# Patient Record
Sex: Female | Born: 1955 | Race: White | Hispanic: No | Marital: Married | Smoking: Never smoker
Health system: Southern US, Community
[De-identification: ages and names within clinical notes are randomized; demographics above are authoritative.]

## PROBLEM LIST (undated history)

## (undated) DIAGNOSIS — E119 Type 2 diabetes mellitus without complications: Secondary | ICD-10-CM

## (undated) DIAGNOSIS — G4730 Sleep apnea, unspecified: Secondary | ICD-10-CM

## (undated) DIAGNOSIS — C801 Malignant (primary) neoplasm, unspecified: Secondary | ICD-10-CM

## (undated) DIAGNOSIS — Z87442 Personal history of urinary calculi: Secondary | ICD-10-CM

## (undated) DIAGNOSIS — Z8709 Personal history of other diseases of the respiratory system: Secondary | ICD-10-CM

## (undated) DIAGNOSIS — T4145XA Adverse effect of unspecified anesthetic, initial encounter: Secondary | ICD-10-CM

## (undated) DIAGNOSIS — R112 Nausea with vomiting, unspecified: Secondary | ICD-10-CM

## (undated) DIAGNOSIS — I639 Cerebral infarction, unspecified: Secondary | ICD-10-CM

## (undated) DIAGNOSIS — K219 Gastro-esophageal reflux disease without esophagitis: Secondary | ICD-10-CM

## (undated) DIAGNOSIS — Z9889 Other specified postprocedural states: Secondary | ICD-10-CM

## (undated) DIAGNOSIS — R51 Headache: Secondary | ICD-10-CM

## (undated) DIAGNOSIS — I10 Essential (primary) hypertension: Secondary | ICD-10-CM

## (undated) DIAGNOSIS — M1990 Unspecified osteoarthritis, unspecified site: Secondary | ICD-10-CM

## (undated) DIAGNOSIS — E7800 Pure hypercholesterolemia, unspecified: Secondary | ICD-10-CM

## (undated) DIAGNOSIS — E0590 Thyrotoxicosis, unspecified without thyrotoxic crisis or storm: Secondary | ICD-10-CM

## (undated) DIAGNOSIS — F419 Anxiety disorder, unspecified: Secondary | ICD-10-CM

## (undated) DIAGNOSIS — K08109 Complete loss of teeth, unspecified cause, unspecified class: Secondary | ICD-10-CM

## (undated) DIAGNOSIS — T8859XA Other complications of anesthesia, initial encounter: Secondary | ICD-10-CM

## (undated) DIAGNOSIS — R42 Dizziness and giddiness: Secondary | ICD-10-CM

## (undated) DIAGNOSIS — R519 Headache, unspecified: Secondary | ICD-10-CM

## (undated) DIAGNOSIS — IMO0002 Reserved for concepts with insufficient information to code with codable children: Secondary | ICD-10-CM

## (undated) DIAGNOSIS — Z972 Presence of dental prosthetic device (complete) (partial): Secondary | ICD-10-CM

## (undated) DIAGNOSIS — Z8719 Personal history of other diseases of the digestive system: Secondary | ICD-10-CM

## (undated) DIAGNOSIS — I499 Cardiac arrhythmia, unspecified: Secondary | ICD-10-CM

## (undated) DIAGNOSIS — G5600 Carpal tunnel syndrome, unspecified upper limb: Secondary | ICD-10-CM

## (undated) HISTORY — PX: WRIST SURGERY: SHX841

## (undated) HISTORY — PX: EYE SURGERY: SHX253

## (undated) HISTORY — PX: CARDIAC CATHETERIZATION: SHX172

## (undated) HISTORY — DX: Pure hypercholesterolemia, unspecified: E78.00

## (undated) HISTORY — PX: CATARACT EXTRACTION, BILATERAL: SHX1313

## (undated) HISTORY — DX: Thyrotoxicosis, unspecified without thyrotoxic crisis or storm: E05.90

## (undated) HISTORY — PX: ABDOMINAL HYSTERECTOMY: SHX81

## (undated) HISTORY — PX: BACK SURGERY: SHX140

## (undated) HISTORY — PX: SHOULDER SURGERY: SHX246

## (undated) HISTORY — PX: PARTIAL HYSTERECTOMY: SHX80

## (undated) HISTORY — DX: Essential (primary) hypertension: I10

## (undated) SURGERY — Surgical Case
Anesthesia: *Unknown

---
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
## 1988-11-28 HISTORY — PX: HERNIA REPAIR: SHX51

## 1993-11-28 DIAGNOSIS — I499 Cardiac arrhythmia, unspecified: Secondary | ICD-10-CM

## 1993-11-28 HISTORY — DX: Cardiac arrhythmia, unspecified: I49.9

## 1998-08-03 ENCOUNTER — Emergency Department (HOSPITAL_COMMUNITY): Admission: EM | Admit: 1998-08-03 | Discharge: 1998-08-03 | Payer: Self-pay | Admitting: Emergency Medicine

## 1998-08-06 ENCOUNTER — Emergency Department (HOSPITAL_COMMUNITY): Admission: EM | Admit: 1998-08-06 | Discharge: 1998-08-06 | Payer: Self-pay

## 1999-01-01 ENCOUNTER — Ambulatory Visit (HOSPITAL_COMMUNITY): Admission: RE | Admit: 1999-01-01 | Discharge: 1999-01-01 | Payer: Self-pay | Admitting: Urology

## 1999-01-08 ENCOUNTER — Other Ambulatory Visit: Admission: RE | Admit: 1999-01-08 | Discharge: 1999-01-08 | Payer: Self-pay | Admitting: *Deleted

## 1999-02-24 ENCOUNTER — Inpatient Hospital Stay (HOSPITAL_COMMUNITY): Admission: RE | Admit: 1999-02-24 | Discharge: 1999-02-26 | Payer: Self-pay | Admitting: Obstetrics and Gynecology

## 1999-03-02 ENCOUNTER — Emergency Department (HOSPITAL_COMMUNITY): Admission: EM | Admit: 1999-03-02 | Discharge: 1999-03-02 | Payer: Self-pay | Admitting: Internal Medicine

## 1999-12-10 ENCOUNTER — Ambulatory Visit (HOSPITAL_COMMUNITY): Admission: RE | Admit: 1999-12-10 | Discharge: 1999-12-10 | Payer: Self-pay | Admitting: Gastroenterology

## 1999-12-10 ENCOUNTER — Encounter (INDEPENDENT_AMBULATORY_CARE_PROVIDER_SITE_OTHER): Payer: Self-pay | Admitting: Specialist

## 1999-12-10 ENCOUNTER — Emergency Department (HOSPITAL_COMMUNITY): Admission: EM | Admit: 1999-12-10 | Discharge: 1999-12-10 | Payer: Self-pay | Admitting: Emergency Medicine

## 2000-04-23 ENCOUNTER — Emergency Department (HOSPITAL_COMMUNITY): Admission: EM | Admit: 2000-04-23 | Discharge: 2000-04-23 | Payer: Self-pay | Admitting: Emergency Medicine

## 2000-04-23 ENCOUNTER — Encounter: Payer: Self-pay | Admitting: Emergency Medicine

## 2002-11-28 HISTORY — PX: BACK SURGERY: SHX140

## 2004-08-16 ENCOUNTER — Encounter: Admission: RE | Admit: 2004-08-16 | Discharge: 2004-09-28 | Payer: Self-pay | Admitting: Orthopedic Surgery

## 2007-01-05 ENCOUNTER — Ambulatory Visit: Payer: Self-pay | Admitting: Vascular Surgery

## 2008-07-01 ENCOUNTER — Ambulatory Visit (HOSPITAL_COMMUNITY): Admission: RE | Admit: 2008-07-01 | Discharge: 2008-07-01 | Payer: Self-pay | Admitting: Internal Medicine

## 2009-11-10 DIAGNOSIS — M799 Soft tissue disorder, unspecified: Secondary | ICD-10-CM | POA: Insufficient documentation

## 2009-11-10 DIAGNOSIS — A0472 Enterocolitis due to Clostridium difficile, not specified as recurrent: Secondary | ICD-10-CM | POA: Insufficient documentation

## 2009-11-10 DIAGNOSIS — H7290 Unspecified perforation of tympanic membrane, unspecified ear: Secondary | ICD-10-CM | POA: Insufficient documentation

## 2009-11-10 DIAGNOSIS — R42 Dizziness and giddiness: Secondary | ICD-10-CM | POA: Insufficient documentation

## 2009-11-10 DIAGNOSIS — Z833 Family history of diabetes mellitus: Secondary | ICD-10-CM | POA: Insufficient documentation

## 2009-11-10 DIAGNOSIS — E785 Hyperlipidemia, unspecified: Secondary | ICD-10-CM | POA: Insufficient documentation

## 2009-11-10 DIAGNOSIS — R519 Headache, unspecified: Secondary | ICD-10-CM | POA: Insufficient documentation

## 2009-11-10 DIAGNOSIS — H8109 Meniere's disease, unspecified ear: Secondary | ICD-10-CM | POA: Insufficient documentation

## 2011-11-04 ENCOUNTER — Emergency Department (HOSPITAL_COMMUNITY): Payer: Self-pay

## 2011-11-04 ENCOUNTER — Encounter: Payer: Self-pay | Admitting: *Deleted

## 2011-11-04 ENCOUNTER — Emergency Department (HOSPITAL_COMMUNITY)
Admission: EM | Admit: 2011-11-04 | Discharge: 2011-11-04 | Disposition: A | Discharge status: Home / Self Care | Payer: Self-pay | Attending: Emergency Medicine | Admitting: Emergency Medicine

## 2011-11-04 DIAGNOSIS — M79605 Pain in left leg: Secondary | ICD-10-CM

## 2011-11-04 DIAGNOSIS — M79609 Pain in unspecified limb: Secondary | ICD-10-CM | POA: Insufficient documentation

## 2011-11-04 DIAGNOSIS — W07XXXA Fall from chair, initial encounter: Secondary | ICD-10-CM | POA: Insufficient documentation

## 2011-11-04 DIAGNOSIS — M5430 Sciatica, unspecified side: Secondary | ICD-10-CM | POA: Insufficient documentation

## 2011-11-04 DIAGNOSIS — I10 Essential (primary) hypertension: Secondary | ICD-10-CM | POA: Insufficient documentation

## 2011-11-04 DIAGNOSIS — M25559 Pain in unspecified hip: Secondary | ICD-10-CM | POA: Insufficient documentation

## 2011-11-04 HISTORY — DX: Essential (primary) hypertension: I10

## 2011-11-04 LAB — DIFFERENTIAL
Basophils Relative: 0 % (ref 0–1)
Lymphocytes Relative: 28 % (ref 12–46)
Lymphs Abs: 2.5 10*3/uL (ref 0.7–4.0)
Monocytes Absolute: 0.5 10*3/uL (ref 0.1–1.0)
Monocytes Relative: 6 % (ref 3–12)
Neutro Abs: 5.8 10*3/uL (ref 1.7–7.7)

## 2011-11-04 LAB — BASIC METABOLIC PANEL
BUN: 18 mg/dL (ref 6–23)
CO2: 28 mEq/L (ref 19–32)
Chloride: 99 mEq/L (ref 96–112)
Creatinine, Ser: 1.06 mg/dL (ref 0.50–1.10)
Glucose, Bld: 117 mg/dL — ABNORMAL HIGH (ref 70–99)

## 2011-11-04 LAB — CBC
HCT: 37 % (ref 36.0–46.0)
Hemoglobin: 12.5 g/dL (ref 12.0–15.0)
MCHC: 33.8 g/dL (ref 30.0–36.0)
RBC: 4.03 MIL/uL (ref 3.87–5.11)

## 2011-11-04 MED ORDER — PREDNISONE 10 MG PO TABS: 20.0000 mg | ORAL_TABLET | Freq: Every day | ORAL | Status: DC

## 2011-11-04 MED ORDER — MORPHINE SULFATE 4 MG/ML IJ SOLN
4.0000 mg | Freq: Once | INTRAMUSCULAR | Status: AC
  Administered 2011-11-04: 4 mg via INTRAVENOUS
  Filled 2011-11-04: qty 1

## 2011-11-04 MED ORDER — OXYCODONE-ACETAMINOPHEN 5-325 MG PO TABS
2.0000 | ORAL_TABLET | Freq: Once | ORAL | Status: AC
  Administered 2011-11-04: 2 via ORAL
  Filled 2011-11-04: qty 2

## 2011-11-04 MED ORDER — PREDNISONE 20 MG PO TABS
60.0000 mg | ORAL_TABLET | Freq: Once | ORAL | Status: AC
  Administered 2011-11-04: 60 mg via ORAL
  Filled 2011-11-04: qty 3

## 2011-11-04 MED ORDER — OXYCODONE-ACETAMINOPHEN 5-325 MG PO TABS: 2.0000 | ORAL_TABLET | ORAL | Status: AC | PRN

## 2011-11-04 NOTE — ED Notes (Signed)
I gave the patient a pair of extra large socks.

## 2011-11-04 NOTE — ED Notes (Signed)
Patient missed a step.  She fell forward and twisted her back and her left hip.  Patient also reports numbness in her leg on the left side

## 2011-11-04 NOTE — ED Provider Notes (Signed)
History     CSN: [id number] Arrival date & time: 11/04/2011  2:19 PM   First MD Initiated Contact with Patient 11/04/11 1704      Chief Complaint  Patient presents with  . Fall    (Consider location/radiation/quality/duration/timing/severity/associated sxs/prior treatment) Patient is a 55 y.o. female presenting with fall. The history is provided by the patient. No language interpreter was used.  Fall The accident occurred 3 to 5 hours ago. Incident: from a chair. She fell from a height of 1 to 2 ft. She landed on a hard floor. There was no blood loss. The point of impact was the left hip. Pain location: left thigh. The pain is severe. She was ambulatory at the scene. There was no entrapment after the fall. There was no drug use involved in the accident. There was no alcohol use involved in the accident. The symptoms are aggravated by activity and ambulation.    Past Medical History  Diagnosis Date  . Hypertension     Past Surgical History  Procedure Date  . Back surgery   . Abdominal hysterectomy     No family history on file.  History  Substance Use Topics  . Smoking status: Never Smoker   . Smokeless tobacco: Not on file  . Alcohol Use: No    OB History    Grav Para Term Preterm Abortions TAB SAB Ect Mult Living                  Review of Systems  Musculoskeletal: Positive for myalgias and arthralgias.  All other systems reviewed and are negative.    Allergies  Dilaudid and Penicillins  Home Medications   Current Outpatient Rx  Name Route Sig Dispense Refill  . ASPIRIN 81 MG PO TABS Oral Take 81 mg by mouth daily.      Marland Kitchen ESTRADIOL 1 MG PO TABS Oral Take 1 mg by mouth daily.      Marland Kitchen METOPROLOL SUCCINATE ER 50 MG PO TB24 Oral Take 50 mg by mouth daily.      . TRIAMTERENE-HCTZ 75-50 MG PO TABS Oral Take 1 tablet by mouth daily.        BP 145/94  Pulse 81  Temp(Src) 98.4 F (36.9 C) (Oral)  Resp 18  Ht 5\' 7"  (1.702 m)  Wt 160 lb (72.576 kg)  BMI  25.06 kg/m2  SpO2 96%  Physical Exam  Nursing note and vitals reviewed. Constitutional: She is oriented to person, place, and time. She appears well-developed and well-nourished.  HENT:  Head: Normocephalic.  Eyes: Conjunctivae are normal. Pupils are equal, round, and reactive to light.  Neck: Normal range of motion. Neck supple.  Cardiovascular: Normal rate, regular rhythm and normal heart sounds.   Pulmonary/Chest: Effort normal and breath sounds normal.  Abdominal: Soft. Bowel sounds are normal.  Musculoskeletal: She exhibits tenderness.       Right shoulder: She exhibits tenderness.       Legs: Neurological: She is alert and oriented to person, place, and time. No sensory deficit.  Skin: Skin is warm and dry.  Psychiatric: She has a normal mood and affect.    ED Course  Procedures (including critical care time)   Labs Reviewed  CBC  DIFFERENTIAL  BASIC METABOLIC PANEL   Dg Hip Complete Left  11/04/2011  *RADIOLOGY REPORT*  Clinical Data: Left hip pain post fall  LEFT HIP - COMPLETE 2+ VIEW  Comparison: None.  Findings: Question mild osseous demineralization. Minimal narrowing of left hip  joint with questionable subchondral cystic changes, suggesting degenerative joint disease. Subtle cortical irregularity identified at superior margin of left femoral neck. Findings suspicious for nondisplaced fracture though not definitive. No additional focal bony abnormalities are identified. Pelvis appears intact.  IMPRESSION: Cortical irregularity is superior margin of left femoral neck worrisome for nondisplaced femoral neck fracture. Recommend further assessment by either MR or CT without contrast.  Original Report Authenticated By: Lollie Marrow, M.D.     No diagnosis found.  CT of hip did not confirm fracture.  Results discussed with patient.  Patient will be discharged home with short course of steroids and pain meds, and is requested to follow-up with her PCP on Monday.  MDM           Jimmye Norman, NP 11/04/11 2056

## 2011-11-04 NOTE — ED Notes (Signed)
I gave the patient a warm blanket. 

## 2011-11-05 NOTE — ED Provider Notes (Signed)
Medical screening examination/treatment/procedure(s) were performed by non-physician practitioner and as supervising physician I was immediately available for consultation/collaboration.  Nicholes Stairs, MD 11/05/11 [phone]

## 2011-12-02 DIAGNOSIS — E669 Obesity, unspecified: Secondary | ICD-10-CM | POA: Insufficient documentation

## 2012-09-18 ENCOUNTER — Encounter (HOSPITAL_BASED_OUTPATIENT_CLINIC_OR_DEPARTMENT_OTHER): Payer: Self-pay | Admitting: *Deleted

## 2012-09-18 NOTE — Progress Notes (Signed)
To come in for [id number]  

## 2012-09-19 ENCOUNTER — Encounter (HOSPITAL_BASED_OUTPATIENT_CLINIC_OR_DEPARTMENT_OTHER)
Admission: RE | Admit: 2012-09-19 | Discharge: 2012-09-19 | Disposition: A | Discharge status: Home / Self Care | Payer: BC Managed Care – PPO | Source: Ambulatory Visit | Attending: Orthopedic Surgery | Admitting: Orthopedic Surgery

## 2012-09-19 LAB — BASIC METABOLIC PANEL
BUN: 8 mg/dL (ref 6–23)
CO2: 30 mEq/L (ref 19–32)
Calcium: 9.1 mg/dL (ref 8.4–10.5)
Chloride: 99 mEq/L (ref 96–112)
Creatinine, Ser: 0.88 mg/dL (ref 0.50–1.10)
Glucose, Bld: 103 mg/dL — ABNORMAL HIGH (ref 70–99)

## 2012-09-20 ENCOUNTER — Encounter (HOSPITAL_BASED_OUTPATIENT_CLINIC_OR_DEPARTMENT_OTHER)
Admission: RE | Disposition: A | Discharge status: Home / Self Care | Payer: Self-pay | Source: Ambulatory Visit | Attending: Orthopedic Surgery

## 2012-09-20 ENCOUNTER — Ambulatory Visit (HOSPITAL_BASED_OUTPATIENT_CLINIC_OR_DEPARTMENT_OTHER)
Admission: RE | Admit: 2012-09-20 | Discharge: 2012-09-20 | Disposition: A | Discharge status: Home / Self Care | Payer: BC Managed Care – PPO | Source: Ambulatory Visit | Attending: Orthopedic Surgery | Admitting: Orthopedic Surgery

## 2012-09-20 ENCOUNTER — Encounter (HOSPITAL_BASED_OUTPATIENT_CLINIC_OR_DEPARTMENT_OTHER): Payer: Self-pay | Admitting: Anesthesiology

## 2012-09-20 ENCOUNTER — Encounter (HOSPITAL_BASED_OUTPATIENT_CLINIC_OR_DEPARTMENT_OTHER): Payer: Self-pay | Admitting: *Deleted

## 2012-09-20 ENCOUNTER — Ambulatory Visit (HOSPITAL_BASED_OUTPATIENT_CLINIC_OR_DEPARTMENT_OTHER): Payer: BC Managed Care – PPO | Admitting: Anesthesiology

## 2012-09-20 DIAGNOSIS — G5600 Carpal tunnel syndrome, unspecified upper limb: Secondary | ICD-10-CM | POA: Insufficient documentation

## 2012-09-20 DIAGNOSIS — I10 Essential (primary) hypertension: Secondary | ICD-10-CM | POA: Insufficient documentation

## 2012-09-20 DIAGNOSIS — Z01810 Encounter for preprocedural cardiovascular examination: Secondary | ICD-10-CM | POA: Insufficient documentation

## 2012-09-20 DIAGNOSIS — M654 Radial styloid tenosynovitis [de Quervain]: Secondary | ICD-10-CM | POA: Insufficient documentation

## 2012-09-20 DIAGNOSIS — Z01812 Encounter for preprocedural laboratory examination: Secondary | ICD-10-CM | POA: Insufficient documentation

## 2012-09-20 HISTORY — PX: DORSAL COMPARTMENT RELEASE: SHX5039

## 2012-09-20 HISTORY — DX: Reserved for concepts with insufficient information to code with codable children: IMO0002

## 2012-09-20 HISTORY — DX: Carpal tunnel syndrome, unspecified upper limb: G56.00

## 2012-09-20 HISTORY — DX: Cardiac arrhythmia, unspecified: I49.9

## 2012-09-20 HISTORY — DX: Unspecified osteoarthritis, unspecified site: M19.90

## 2012-09-20 HISTORY — PX: CARPAL TUNNEL RELEASE: SHX101

## 2012-09-20 HISTORY — DX: Complete loss of teeth, unspecified cause, unspecified class: K08.109

## 2012-09-20 HISTORY — DX: Presence of dental prosthetic device (complete) (partial): Z97.2

## 2012-09-20 SURGERY — CARPAL TUNNEL RELEASE
Anesthesia: General | Site: Wrist | Laterality: Left | Wound class: Clean

## 2012-09-20 MED ORDER — MORPHINE SULFATE 2 MG/ML IJ SOLN: 1.0000 mg | INTRAMUSCULAR | Status: DC | PRN

## 2012-09-20 MED ORDER — LIDOCAINE HCL 2 % IJ SOLN
INTRAMUSCULAR | Status: DC | PRN
  Administered 2012-09-20: 4 mL

## 2012-09-20 MED ORDER — ONDANSETRON HCL 4 MG/2ML IJ SOLN
INTRAMUSCULAR | Status: DC | PRN
  Administered 2012-09-20: 4 mg via INTRAVENOUS

## 2012-09-20 MED ORDER — ACETAMINOPHEN 10 MG/ML IV SOLN: 1000.0000 mg | Freq: Once | INTRAVENOUS | Status: DC | PRN

## 2012-09-20 MED ORDER — PROPOFOL 10 MG/ML IV BOLUS
INTRAVENOUS | Status: DC | PRN
  Administered 2012-09-20: 150 mg via INTRAVENOUS

## 2012-09-20 MED ORDER — FENTANYL CITRATE 0.05 MG/ML IJ SOLN
INTRAMUSCULAR | Status: DC | PRN
  Administered 2012-09-20: 50 ug via INTRAVENOUS

## 2012-09-20 MED ORDER — MIDAZOLAM HCL 5 MG/5ML IJ SOLN
INTRAMUSCULAR | Status: DC | PRN
  Administered 2012-09-20: 2 mg via INTRAVENOUS

## 2012-09-20 MED ORDER — DEXAMETHASONE SODIUM PHOSPHATE 4 MG/ML IJ SOLN
INTRAMUSCULAR | Status: DC | PRN
  Administered 2012-09-20: 10 mg via INTRAVENOUS

## 2012-09-20 MED ORDER — LIDOCAINE HCL (CARDIAC) 20 MG/ML IV SOLN
INTRAVENOUS | Status: DC | PRN
  Administered 2012-09-20: 50 mg via INTRAVENOUS

## 2012-09-20 MED ORDER — OXYCODONE-ACETAMINOPHEN 5-325 MG PO TABS: ORAL_TABLET | ORAL | Status: DC

## 2012-09-20 MED ORDER — LACTATED RINGERS IV SOLN
INTRAVENOUS | Status: DC
  Administered 2012-09-20: 07:00:00 via INTRAVENOUS

## 2012-09-20 MED ORDER — ONDANSETRON HCL 4 MG/2ML IJ SOLN: 4.0000 mg | Freq: Once | INTRAMUSCULAR | Status: DC | PRN

## 2012-09-20 SURGICAL SUPPLY — 47 items
BANDAGE ADHESIVE 1X3 (GAUZE/BANDAGES/DRESSINGS) IMPLANT
BANDAGE ELASTIC 3 VELCRO ST LF (GAUZE/BANDAGES/DRESSINGS) ×2 IMPLANT
BLADE MINI RND TIP GREEN BEAV (BLADE) IMPLANT
BLADE SURG 15 STRL LF DISP TIS (BLADE) ×1 IMPLANT
BLADE SURG 15 STRL SS (BLADE) ×2
BNDG CMPR 9X4 STRL LF SNTH (GAUZE/BANDAGES/DRESSINGS) ×1
BNDG ESMARK 4X9 LF (GAUZE/BANDAGES/DRESSINGS) ×1 IMPLANT
BRUSH SCRUB EZ PLAIN DRY (MISCELLANEOUS) ×2 IMPLANT
CLOTH BEACON ORANGE TIMEOUT ST (SAFETY) ×2 IMPLANT
CORDS BIPOLAR (ELECTRODE) ×1 IMPLANT
COVER MAYO STAND STRL (DRAPES) ×2 IMPLANT
COVER SURGICAL LIGHT HANDLE (MISCELLANEOUS) ×1 IMPLANT
COVER TABLE BACK 60X90 (DRAPES) ×2 IMPLANT
CUFF TOURNIQUET SINGLE 18IN (TOURNIQUET CUFF) ×1 IMPLANT
DECANTER SPIKE VIAL GLASS SM (MISCELLANEOUS) IMPLANT
DRAPE EXTREMITY T 121X128X90 (DRAPE) ×2 IMPLANT
DRAPE SURG 17X23 STRL (DRAPES) ×2 IMPLANT
DRSG TEGADERM 4X4.75 (GAUZE/BANDAGES/DRESSINGS) IMPLANT
GLOVE BIO SURGEON STRL SZ7.5 (GLOVE) ×1 IMPLANT
GLOVE BIOGEL M STRL SZ7.5 (GLOVE) ×2 IMPLANT
GLOVE BIOGEL PI IND STRL 8 (GLOVE) IMPLANT
GLOVE BIOGEL PI INDICATOR 8 (GLOVE) ×1
GLOVE ORTHO TXT STRL SZ7.5 (GLOVE) ×2 IMPLANT
GOWN BRE IMP PREV XXLGXLNG (GOWN DISPOSABLE) ×3 IMPLANT
GOWN PREVENTION PLUS XLARGE (GOWN DISPOSABLE) ×1 IMPLANT
NDL SAFETY ECLIPSE 18X1.5 (NEEDLE) IMPLANT
NEEDLE 27GAX1X1/2 (NEEDLE) ×1 IMPLANT
NEEDLE HYPO 18GX1.5 SHARP (NEEDLE) ×2
PACK BASIN DAY SURGERY FS (CUSTOM PROCEDURE TRAY) ×2 IMPLANT
PAD CAST 3X4 CTTN HI CHSV (CAST SUPPLIES) ×1 IMPLANT
PADDING CAST ABS 4INX4YD NS (CAST SUPPLIES)
PADDING CAST ABS COTTON 4X4 ST (CAST SUPPLIES) ×1 IMPLANT
PADDING CAST COTTON 3X4 STRL (CAST SUPPLIES) ×2
SLEEVE SCD COMPRESS KNEE MED (MISCELLANEOUS) IMPLANT
SPLINT PLASTER CAST XFAST 3X15 (CAST SUPPLIES) ×5 IMPLANT
SPLINT PLASTER XTRA FASTSET 3X (CAST SUPPLIES) ×5
SPONGE GAUZE 4X4 12PLY (GAUZE/BANDAGES/DRESSINGS) ×1 IMPLANT
STOCKINETTE 4X48 STRL (DRAPES) ×2 IMPLANT
STRIP CLOSURE SKIN 1/2X4 (GAUZE/BANDAGES/DRESSINGS) ×2 IMPLANT
SUT PROLENE 3 0 PS 2 (SUTURE) ×2 IMPLANT
SUT VIC AB 4-0 P-3 18XBRD (SUTURE) IMPLANT
SUT VIC AB 4-0 P3 18 (SUTURE)
SYR 3ML 23GX1 SAFETY (SYRINGE) IMPLANT
SYR CONTROL 10ML LL (SYRINGE) ×1 IMPLANT
TRAY DSU PREP LF (CUSTOM PROCEDURE TRAY) ×2 IMPLANT
UNDERPAD 30X30 INCONTINENT (UNDERPADS AND DIAPERS) ×2 IMPLANT
WATER STERILE IRR 1000ML POUR (IV SOLUTION) ×1 IMPLANT

## 2012-09-20 NOTE — H&P (Signed)
Natalie Bond is an 56 y.o. female.   Chief Complaint: c/o chronic and progressive numbness and tingling of the left hand and pain left 1st D.C. 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 She has had a 5 month history of bilateral hand numbness and tingling left worse than right. She also notes tenderness at her left first dorsal compartment and episodic triggering of her fingers, most notably her left ring finger. She has been dropping objects on a regular basis. She seeks an upper extremity orthopaedic consult. She has been on Nortriptyline to try and blunt her symptoms. She notes that this has been mildly helpful.   Past Medical History  Diagnosis Date  . Hypertension   . Dysrhythmia 1995    irreg hr  . Arthritis   . Carpal tunnel syndrome   . DDD (degenerative disc disease)   . Full dentures     Past Surgical History  Procedure Date  . Back surgery 2004    [id number]  . Abdominal hysterectomy   . Eye surgery     bilat cataracts    History reviewed. No pertinent family history. Social History:  reports that she has never smoked. She does not have any smokeless tobacco history on file. She reports that she does not drink alcohol or use illicit drugs.  Allergies:  Allergies  Allergen Reactions  . Dilaudid (Hydromorphone Hcl) Anaphylaxis  . Penicillins Hives    Medications Prior to Admission  Medication Sig Dispense Refill  . aspirin 81 MG tablet Take 81 mg by mouth daily.        . cyclobenzaprine (FLEXERIL) 10 MG tablet Take 10 mg by mouth 3 (three) times daily as needed.      Marland Kitchen estradiol (ESTRACE) 1 MG tablet Take 1 mg by mouth daily.        Marland Kitchen HYDROcodone-acetaminophen (NORCO/VICODIN) 5-325 MG per tablet Take 1 tablet by mouth every 6 (six) hours as needed.      . metoprolol (TOPROL-XL) 50 MG 24 hr tablet Take 50 mg by mouth daily.        Marland Kitchen triamterene-hydrochlorothiazide (MAXZIDE) 75-50 MG per tablet Take 1 tablet by mouth daily.           Results for orders placed during the hospital encounter of 09/20/12 (from the past 48 hour(s))  BASIC METABOLIC PANEL     Status: Abnormal   Collection Time   09/19/12  4:30 PM      Component Value Range Comment   Sodium 138  135 - 145 mEq/L    Potassium 3.4 (*) 3.5 - 5.1 mEq/L    Chloride 99  96 - 112 mEq/L    CO2 30  19 - 32 mEq/L    Glucose, Bld 103 (*) 70 - 99 mg/dL    BUN 8  6 - 23 mg/dL    Creatinine, Ser 6.04  0.50 - 1.10 mg/dL    Calcium 9.1  8.4 - 54.0 mg/dL    GFR calc non Af Amer 73 (*) >90 mL/min    GFR calc Af Amer 85 (*) >90 mL/min   POCT HEMOGLOBIN-HEMACUE     Status: Normal   Collection Time   09/20/12  6:55 AM      Component Value Range Comment   Hemoglobin 12.9  12.0 - 15.0 g/dL     No results found.   Pertinent items are noted in HPI.  Blood pressure 179/92, pulse 46, temperature 97.3 F (36.3 C), temperature  source Oral, resp. rate 18, height 5\' 7"  (1.702 m), weight 83.099 kg (183 lb 3.2 oz), SpO2 98.00%.  General appearance: alert Head: Normocephalic, without obvious abnormality Neck: supple, symmetrical, trachea midline Resp: clear to auscultation bilaterally Cardio: regular rate and rhythm GI: normal findings: bowel sounds normal Extremities: Inspection of her hands reveals mild shouldering of her thumb CMC joints. She has borderline triggering of her left ring finger. She has a positive wrist flexion test reproducing median numbness bilaterally. She has a positive Tinel's sign over the median nerve bilaterally. She has a positive Finkelstein's maneuver on the left. She has positive Tinel's over the left radial sensory branches.   X-rays of her left hand AP, lateral and obliques demonstrate age appropriate changes at her IP joints. She has narrowing and marginal osteophyte formation at the thumb [facility] joint.  Electrodiagnostic studies: These confirmed very significant bilateral carpal tunnel syndrome left worse than right.    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   DASNOIT,Dallis Czaja J 09/20/2012, 7:04 AM     H&P documentation: 09/20/2012  -History and Physical Reviewed  -Patient has been re-examined  -No change in the plan of care  Wyn Forster, MD

## 2012-09-20 NOTE — Op Note (Signed)
[id number] 

## 2012-09-20 NOTE — Anesthesia Preprocedure Evaluation (Signed)
Anesthesia Evaluation  Patient identified by MRN, date of birth, ID band Patient awake    Reviewed: Allergy & Precautions, H&P , NPO status , Patient's Chart, lab work & pertinent test results  Airway Mallampati: II      Dental  (+) Teeth Intact, Edentulous Upper and Edentulous Lower   Pulmonary  breath sounds clear to auscultation        Cardiovascular Rhythm:Regular Rate:Normal     Neuro/Psych    GI/Hepatic   Endo/Other    Renal/GU      Musculoskeletal   Abdominal   Peds  Hematology   Anesthesia Other Findings   Reproductive/Obstetrics                           Anesthesia Physical Anesthesia Plan  ASA: II  Anesthesia Plan: General   Post-op Pain Management:    Induction: Intravenous  Airway Management Planned: LMA  Additional Equipment:   Intra-op Plan:   Post-operative Plan:   Informed Consent:   Plan Discussed with: CRNA and Surgeon  Anesthesia Plan Comments:         Anesthesia Quick Evaluation

## 2012-09-20 NOTE — Anesthesia Postprocedure Evaluation (Signed)
  Anesthesia Post-op Note  [name]  Procedure(s) Performed: Procedure(s) (LRB) with comments: CARPAL TUNNEL RELEASE (Left) RELEASE DORSAL COMPARTMENT (DEQUERVAIN) (Left)  Patient Location: PACU  Anesthesia Type: General  Level of Consciousness: awake, alert  and oriented  Airway and Oxygen Therapy: Patient Spontanous Breathing  Post-op Pain: none  Post-op Assessment: Post-op Vital signs reviewed and Patient's Cardiovascular Status Stable  Post-op Vital Signs: stable  Complications: No apparent anesthesia complications

## 2012-09-20 NOTE — Brief Op Note (Signed)
09/20/2012  8:13 AM  PATIENT:  Natalie Bond  56 y.o. female  PRE-OPERATIVE DIAGNOSIS:  Severe left carpal tunnel syndrome, stenosing tenosynovitis left 1st dorsal compartment   POST-OPERATIVE DIAGNOSIS:  Severe left carpal tunnel syndrome, stenosing tenosynovitis left 1st dorsal compartment   PROCEDURE:  Procedure(s) (LRB) with comments: CARPAL TUNNEL RELEASE (Left) RELEASE DORSAL COMPARTMENT (DEQUERVAIN) (Left)  SURGEON:  Surgeon(s) and Role:    * Wyn Forster., MD - Primary  PHYSICIAN ASSISTANT:   ASSISTANTS:Jamaris Biernat Dasnoit,P.A-C   ANESTHESIA:   general  EBL:     BLOOD ADMINISTERED:none  DRAINS: none   LOCAL MEDICATIONS USED:  XYLOCAINE   SPECIMEN:  No Specimen  DISPOSITION OF SPECIMEN:  N/A  COUNTS:  YES  TOURNIQUET:  * Missing tourniquet times found for documented tourniquets in log:  16109 *  DICTATION: .Other Dictation: Dictation Number [id number]  PLAN OF CARE: Discharge to home after PACU  PATIENT DISPOSITION:  PACU - hemodynamically stable.

## 2012-09-20 NOTE — Anesthesia Procedure Notes (Signed)
Procedure Name: LMA Insertion Date/Time: 09/20/2012 7:49 AM Performed by: Gar Gibbon Pre-anesthesia Checklist: Patient identified, Emergency Drugs available, Suction available and Patient being monitored Patient Re-evaluated:Patient Re-evaluated prior to inductionOxygen Delivery Method: Circle System Utilized Preoxygenation: Pre-oxygenation with 100% oxygen Intubation Type: IV induction Ventilation: Mask ventilation without difficulty LMA: LMA inserted LMA Size: 4.0 Number of attempts: 1 Airway Equipment and Method: bite block Placement Confirmation: positive ETCO2 Tube secured with: Tape Dental Injury: Teeth and Oropharynx as per pre-operative assessment

## 2012-09-20 NOTE — Transfer of Care (Signed)
Immediate Anesthesia Transfer of Care Note  [name]  Procedure(s) Performed: Procedure(s) (LRB) with comments: CARPAL TUNNEL RELEASE (Left) RELEASE DORSAL COMPARTMENT (DEQUERVAIN) (Left)  Patient Location: PACU  Anesthesia Type: General  Level of Consciousness: sedated  Airway & Oxygen Therapy: Patient Spontanous Breathing and Patient connected to face mask oxygen  Post-op Assessment: Report given to PACU RN and Post -op Vital signs reviewed and stable  Post vital signs: Reviewed and stable  Complications: No apparent anesthesia complications

## 2012-09-21 ENCOUNTER — Encounter (HOSPITAL_BASED_OUTPATIENT_CLINIC_OR_DEPARTMENT_OTHER): Payer: Self-pay | Admitting: Orthopedic Surgery

## 2012-09-21 NOTE — Op Note (Signed)
NAMEBonnye Bond                ACCOUNT NO.:  [id number]  MEDICAL RECORD NO.:  [id number]  LOCATION:                                 FACILITY:  PHYSICIAN:  Katy Fitch. Mazie Fencl, M.D. DATE OF BIRTH:  [birth year]  DATE OF PROCEDURE:  09/20/2012 DATE OF DISCHARGE:                              OPERATIVE REPORT   PREOPERATIVE DIAGNOSES: 1. 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 2. Chronic stenosing tenosynovitis, left first dorsal compartment.  POSTOPERATIVE DIAGNOSES: 1. 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 2. Chronic stenosing tenosynovitis, left first dorsal compartment. 3. Identification of septum between abductor pollicis longus and     extensor pollicis brevis tendons.  OPERATION: 1. Release of left transverse carpal ligament. 2. Through separate incision, release of left first dorsal compartment     and resection of septum.  OPERATING SURGEON:  Katy Fitch. Awad Gladd, MD  ASSISTANT:  Marveen Reeks Dasnoit, PA-C  ANESTHESIA:  General by LMA.  SUPERVISING ANESTHESIOLOGIST:  Guadalupe Maple, MD  INDICATIONS:  Natalie Bond is a 56 year old woman referred through the courtesy of Dr. Dossie Arbour for evaluation and management of hand numbness and stenosing tenosynovitis symptoms on the radial aspect of the left wrist.  She had intermittent triggering of fingers as well.  Clinical examination confirmed left carpal tunnel syndrome and stenosing tenosynovitis of the left first dorsal compartment.  She did not have active finger stenosing tenosynovitis at the time of our exam.  She was referred for electrodiagnostic studies that confirmed significant bilateral carpal tunnel syndrome, left worse than right.  We advised to proceed with release of her left transcarpal ligament at this time as well as release of left first dorsal compartment.  PROCEDURE:  Fredricka Bonine was brought to room 2 of the [facility] and placed supine  position on the operating table.  Following induction of general anesthesia by LMA technique, the left arm was prepped with Betadine soap and solution, sterilely draped.  A pneumatic tourniquet was applied to proximal brachium.  Following exsanguination of left arm with Esmarch bandage, arterial tourniquet was inflated to 250 mmHg due to background systolic hypertension.  Following routine surgical time-out, the procedure commenced with a short transverse incision directly over the first dorsal compartment. Subcutaneous tissues were carefully divided, taking care to gently retract the radial superficial sensory branches.  The A1 pulley was isolated, noted to be thickened.  It was split with scalpel and scissors.  The abductor pollicis longus was noted to have 2 tendon slips.  There was a separate dorsal compartment for the extensor pollicis brevis.  The septum between the abductor pollicis longus and extensor pollicis brevis tendons was resected with scissors. Thereafter, free range of motion of the wrist and thumb was recovered.  The wound was then repaired with intradermal 3-0 Prolene suture and Steri-Strips.  A 2% lidocaine was infiltrated for postop analgesia.  Attention was then directed to the proximal palm.  A short incision was fashioned in line of the ring finger.  Subcutaneous tissues were carefully divided revealing the palmar fascia.  The palmar fascia was split in line of its fibers to reveal the common sensory branch  of the median nerve.  These were followed back to the median nerve proper which was gently separated from the transverse carpal ligament with a Insurance risk surveyor.  The transverse carpal ligament was then released with scissors subcutaneously extending into the distal forearm.  This widely opened carpal canal.  No mass or other predicaments were noted.  Bleeding points along the margin of the released ligament were electrocauterized with bipolar  current.  The wound was then repaired with intradermal 3-0 Prolene suture.  A compressive dressing was applied with a volar plaster splint maintaining the wrist in 15 degrees of dorsiflexion.  For aftercare, Ms. Doran Durand is provided a prescription for Percocet 5 mg 1 p.o. q.4 hours p.r.n. pain, 20 tablets without refill.     Katy Fitch Jamis Kryder, M.D.     RVS/MEDQ  D:  09/20/2012  T:  09/20/2012  Job:  [id number]  cc:   Barry Dienes. Eloise Harman, M.D.

## 2013-03-22 ENCOUNTER — Other Ambulatory Visit (HOSPITAL_COMMUNITY): Payer: Self-pay | Admitting: Internal Medicine

## 2013-03-22 ENCOUNTER — Ambulatory Visit (HOSPITAL_COMMUNITY)
Admission: RE | Admit: 2013-03-22 | Discharge: 2013-03-22 | Disposition: A | Discharge status: Home / Self Care | Payer: BC Managed Care – PPO | Source: Ambulatory Visit | Attending: Internal Medicine | Admitting: Internal Medicine

## 2013-03-22 DIAGNOSIS — R51 Headache: Secondary | ICD-10-CM

## 2013-03-22 DIAGNOSIS — H538 Other visual disturbances: Secondary | ICD-10-CM | POA: Insufficient documentation

## 2013-03-22 DIAGNOSIS — R112 Nausea with vomiting, unspecified: Secondary | ICD-10-CM | POA: Insufficient documentation

## 2013-03-22 DIAGNOSIS — R42 Dizziness and giddiness: Secondary | ICD-10-CM | POA: Insufficient documentation

## 2013-09-12 DIAGNOSIS — K219 Gastro-esophageal reflux disease without esophagitis: Secondary | ICD-10-CM | POA: Insufficient documentation

## 2014-01-10 ENCOUNTER — Other Ambulatory Visit: Payer: Self-pay | Admitting: Internal Medicine

## 2014-01-10 DIAGNOSIS — R1011 Right upper quadrant pain: Secondary | ICD-10-CM

## 2014-01-17 ENCOUNTER — Ambulatory Visit
Admission: RE | Admit: 2014-01-17 | Discharge: 2014-01-17 | Disposition: A | Discharge status: Home / Self Care | Payer: BC Managed Care – PPO | Source: Ambulatory Visit | Attending: Internal Medicine | Admitting: Internal Medicine

## 2014-01-17 DIAGNOSIS — R1011 Right upper quadrant pain: Secondary | ICD-10-CM

## 2014-04-04 DIAGNOSIS — F5104 Psychophysiologic insomnia: Secondary | ICD-10-CM | POA: Insufficient documentation

## 2014-05-12 ENCOUNTER — Other Ambulatory Visit: Payer: Self-pay | Admitting: Orthopaedic Surgery

## 2014-05-12 DIAGNOSIS — M542 Cervicalgia: Secondary | ICD-10-CM

## 2014-05-12 DIAGNOSIS — M79601 Pain in right arm: Secondary | ICD-10-CM

## 2014-05-12 DIAGNOSIS — M79605 Pain in left leg: Secondary | ICD-10-CM

## 2014-05-12 DIAGNOSIS — M79604 Pain in right leg: Secondary | ICD-10-CM

## 2014-05-12 DIAGNOSIS — M545 Low back pain, unspecified: Secondary | ICD-10-CM

## 2014-05-16 ENCOUNTER — Ambulatory Visit
Admission: RE | Admit: 2014-05-16 | Discharge: 2014-05-16 | Disposition: A | Discharge status: Home / Self Care | Payer: BC Managed Care – PPO | Source: Ambulatory Visit | Attending: Orthopaedic Surgery | Admitting: Orthopaedic Surgery

## 2014-05-16 DIAGNOSIS — M542 Cervicalgia: Secondary | ICD-10-CM

## 2014-05-16 DIAGNOSIS — M79604 Pain in right leg: Secondary | ICD-10-CM

## 2014-05-16 DIAGNOSIS — M79601 Pain in right arm: Secondary | ICD-10-CM

## 2014-05-16 DIAGNOSIS — M545 Low back pain, unspecified: Secondary | ICD-10-CM

## 2014-05-16 DIAGNOSIS — M79605 Pain in left leg: Secondary | ICD-10-CM

## 2014-07-11 ENCOUNTER — Other Ambulatory Visit: Payer: Self-pay | Admitting: Internal Medicine

## 2014-07-11 DIAGNOSIS — R1011 Right upper quadrant pain: Secondary | ICD-10-CM

## 2014-07-16 ENCOUNTER — Ambulatory Visit
Admission: RE | Admit: 2014-07-16 | Discharge: 2014-07-16 | Disposition: A | Discharge status: Home / Self Care | Payer: BC Managed Care – PPO | Source: Ambulatory Visit | Attending: Internal Medicine | Admitting: Internal Medicine

## 2014-07-16 ENCOUNTER — Encounter (INDEPENDENT_AMBULATORY_CARE_PROVIDER_SITE_OTHER): Payer: Self-pay

## 2014-07-16 DIAGNOSIS — R1011 Right upper quadrant pain: Secondary | ICD-10-CM

## 2014-07-16 MED ORDER — IOHEXOL 300 MG/ML  SOLN
100.0000 mL | Freq: Once | INTRAMUSCULAR | Status: AC | PRN
  Administered 2014-07-16: 100 mL via INTRAVENOUS

## 2014-08-08 ENCOUNTER — Ambulatory Visit (INDEPENDENT_AMBULATORY_CARE_PROVIDER_SITE_OTHER): Payer: BC Managed Care – PPO | Admitting: Neurology

## 2014-08-08 ENCOUNTER — Encounter: Payer: Self-pay | Admitting: Neurology

## 2014-08-08 VITALS — BP 166/95 | HR 69 | Ht 67.25 in | Wt 192.0 lb

## 2014-08-08 DIAGNOSIS — R51 Headache: Secondary | ICD-10-CM

## 2014-08-08 DIAGNOSIS — G478 Other sleep disorders: Secondary | ICD-10-CM

## 2014-08-08 DIAGNOSIS — M549 Dorsalgia, unspecified: Secondary | ICD-10-CM

## 2014-08-08 DIAGNOSIS — G8929 Other chronic pain: Secondary | ICD-10-CM

## 2014-08-08 DIAGNOSIS — I10 Essential (primary) hypertension: Secondary | ICD-10-CM

## 2014-08-08 DIAGNOSIS — G2581 Restless legs syndrome: Secondary | ICD-10-CM

## 2014-08-08 DIAGNOSIS — R519 Headache, unspecified: Secondary | ICD-10-CM

## 2014-08-08 NOTE — Progress Notes (Signed)
Subjective:    Patient ID: Natalie Bond is a 58 y.o. female.  HPI    Natalie Age, MD, PhD [facility] [address], [address] [place], [place] [address]  Dear Dr. Philip Bond,   I saw your patient, Natalie Bond, upon your kind request in my neurologic clinic today for initial consultation of her sleep disturbance, in particular, concern for underlying obstructive sleep apnea. The patient is accompanied by her husband today. As you know, Natalie Bond is a 58 year old right-handed woman with a complex medical history of hypertension, overweight state, hyperlipidemia, chronic low back pain (s/p 2 back surgeries under Dr. Ernestina Bond) on narcotic pain medication HS prn mostly, vertigo, Mnire's disease, C. difficile colitis, left rotator cuff tendinitis, and headaches, status post lower back surgery, lipoma removal, who reports a family history of sleepiness in her mother, who passed away 30 years ago and she reports a  recent increase in blood pressure values and nonrestorative sleep. She reports that the Losartan made her very tired, so she stopped it after one day. She also stopped her lasix and her aldactone some 3 months. She works [employer] trader, she works in [employer]. She has numbness in the L leg and L lower back since 2004. She has nocturnal back pain, but also restless legs symptoms, but is not sure if she kicks in her sleep. She does not have to go to the bathroom at night. She does not drink caffeine on a day-to-day basis. She does not smoke. She does not drink alcohol. She has morning headaches.  Her typical bedtime is reported to be around 11 PM and usual wake time is around 5 AM. Sleep onset typically occurs within a 15 minutes. She reports feeling poorly rested upon awakening. She wakes up on an average 3 times in the middle of the night and has to go to the bathroom 0 to 1 times on a typical night.  She reports excessive daytime somnolence  (EDS) and Her Epworth Sleepiness Score (ESS) is 7/24 today. She has not fallen asleep while driving. The patient has not been taking a scheduled nap.  She  is not known to snore overtly but her husband snores very loudly. He has not noted her snoring. She denies waking up with a gasp. He has not noted any witnessed apneas.   She is a restless sleeper and in the morning, the bed is quite disheveled.  she cannot sleep in the same position. Her back bothers her quite a bit. She often ends up sleeping on her stomach.   She denies cataplexy, sleep paralysis, hypnagogic or hypnopompic hallucinations, or sleep attacks. She does not report any vivid dreams, nightmares, dream enactments, or parasomnias, such as sleep talking or sleep walking. The patient has not had a sleep study or a home sleep test.  Her bedroom is usually dark and cool. There is no TV in the bedroom.   Her Past Medical History Is Significant For: Past Medical History  Diagnosis Date  . Hypertension   . High cholesterol     Her Past Surgical History Is Significant For: Past Surgical History  Procedure Laterality Date  . Partial hysterectomy    . Back surgery    . Wrist surgery    . Cataract extraction, bilateral    . Shoulder surgery      Her Family History Is Significant For: History reviewed. No pertinent family history.  Her Social History Is Significant For: History  Social History  . Marital Status: Married    Spouse Name: Natalie Bond    Number of Children: 2  . Years of Education: college   Occupational History  . Natalie Bond    Social History Main Topics  . Smoking status: Never Smoker   . Smokeless tobacco: None  . Alcohol Use: Yes  . Drug Use: No  . Sexual Activity: None   Other Topics Concern  . None   Social History Narrative  . None    Her Allergies Are:  Allergies  Allergen Reactions  . Dilaudid [Hydromorphone Hcl]   :   Her Current Medications Are:  Outpatient Encounter Prescriptions as  of 08/08/2014  Medication Sig  . metoprolol succinate (TOPROL-XL) 50 MG 24 hr tablet Take 50 mg by mouth 4 (four) times daily. Take with or immediately following a meal.  :  Review of Systems:  Out of a complete 14 point review of systems, all are reviewed and negative with the exception of these symptoms as listed below:   Review of Systems  Constitutional: Positive for fatigue.       Weight gain   HENT: Positive for trouble swallowing.   Respiratory: Positive for cough.        Short of breath snoring  Cardiovascular:       Swelling in legs   Endocrine:       Flushing  Musculoskeletal:       Joint pain Joint swelling Cramps Aching muscles  Skin: Positive for rash.       Birth marks Itching   Allergic/Immunologic:       Allergies Runny nose  Neurological: Positive for numbness and headaches.       Insomnia Snoring Restless legs    Psychiatric/Behavioral:       Decreased energy     Objective:  Neurologic Exam  Physical Exam Physical Examination:   Filed Vitals:   08/08/14 0938  BP: 166/95  Pulse: 69    General Examination: The patient is a very pleasant 58 y.o. female in no acute distress. She appears well-developed and well-nourished and well groomed. She is overweight.   HEENT: Normocephalic, atraumatic, pupils are equal, round and reactive to light and accommodation. Funduscopic exam is normal with sharp disc margins noted. Extraocular tracking is good without limitation to gaze excursion or nystagmus noted. Normal smooth pursuit is noted. Hearing is grossly intact. Tympanic membranes are clear bilaterally. Face is symmetric with normal facial animation and normal facial sensation. Speech is clear with no dysarthria noted. There is no hypophonia. There is no lip, neck/head, jaw or voice tremor. Neck is supple with full range of passive and active motion. There are no carotid bruits on auscultation. Oropharynx exam reveals: mild mouth dryness, adequate dental  hygiene and mild airway crowding, due to narrow airway and floppy soft palate. Tonsils are small.Mallampati class I. Tongue protrudes centrally and palate elevates symmetrically.  neck is 14.25 inches. She has a absent overbite.    Chest: Clear to auscultation without wheezing, rhonchi or crackles noted.  Heart: S1+S2+0, regular and normal without murmurs, rubs or gallops noted.   Abdomen: Soft, non-tender and non-distended with normal bowel sounds appreciated on auscultation.  Extremities: There is 1+ pitting edema in the right lower extremity and 2+ pitting edema in the left lower extremity. Pedal pulses are intact.  Skin: Warm and dry without trophic changes noted. There are no varicose veins.  Musculoskeletal: exam reveals no obvious joint deformities, tenderness or joint swelling or erythema.  Neurologically:  Mental status: The patient is awake, alert and oriented in all 4 spheres. Her immediate and remote memory, attention, language skills and fund of knowledge are appropriate. There is no evidence of aphasia, agnosia, apraxia or anomia. Speech is clear with normal prosody and enunciation. Thought process is linear. Mood is normal and affect is normal.  Cranial nerves II - XII are as described above under HEENT exam. In addition: shoulder shrug is normal with equal shoulder height noted. Motor exam: Normal bulk, strength and tone is noted. There is no drift, tremor or rebound. Romberg is negative. Reflexes are 2+ throughout. Babinski: Toes are flexor bilaterally. Fine motor skills and coordination: intact with normal finger taps, normal hand movements, normal rapid alternating patting, normal foot taps and normal foot agility.  Cerebellar testing: No dysmetria or intention tremor on finger to nose testing. Heel to shin is unremarkable bilaterally. There is no truncal or gait ataxia.  Sensory exam: intact to light touch, pinprick, vibration, temperature sense in the upper and lower  extremities.  Gait, station and balance: She stands with mild difficulty due to back pain. No veering to one side is noted. No leaning to one side is noted. Posture is Bond-appropriate and stance is narrow based. Gait shows normal stride length and normal pace. No problems turning are noted. She turns en bloc.   Assessment and Plan:   In summary, Natalie Bond is a very pleasant 58 y.o.-year old female with a complex medical history of hypertension, overweight state, hyperlipidemia, chronic low back pain (s/p 2 back surgeries under Dr. Ernestina Bond) on narcotic pain medication HS prn mostly, vertigo, Mnire's disease, C. difficile colitis, left rotator cuff tendinitis, and headaches, status post lower back surgery, lipoma removal, who reports a family history of sleepiness in her mother, who passed away 30 years ago and she reports a  recent increase in blood pressure values and nonrestorative sleep. While she does not have a telltale history for obstructive sleep apnea, she may have underlying sleep disordered breathing which may contribute to her recent increase in blood pressure values. She reports morning headaches and nonrestorative sleep as well as RLS symptoms.  She is discouraged from stopping her blood pressure medication on her own. She has stopped her diuretics some 3 months ago and also stopped taking her losartan and after just one dose recently. She is encouraged to discuss with you alternative treatments. She mentioned to me that she would like to see a cardiologist. She is encouraged to discuss this with you as well. She is in agreement. I had a long chat with the patient and her husband about my findings and the diagnosis of OSA, its prognosis and treatment options. We talked about medical treatments, surgical interventions and non-pharmacological approaches. I explained in particular the risks and ramifications of untreated moderate to severe OSA, especially with respect to developing  cardiovascular disease down the Road, including congestive heart failure, difficult to treat hypertension, cardiac arrhythmias, or stroke. Even type 2 diabetes has, in part, been linked to untreated OSA. Symptoms of untreated OSA include daytime sleepiness, memory problems, mood irritability and mood disorder such as depression and anxiety, lack of energy, as well as recurrent headaches, especially morning headaches. We talked about trying to maintain a healthy lifestyle in general, as well as the importance of weight control. I encouraged the patient to eat healthy, exercise daily and keep well hydrated, to keep a scheduled bedtime and wake time routine, to not skip any meals and eat  healthy snacks in between meals. I advised the patient not to drive when feeling sleepy. I recommended the following at this time: sleep study with potential positive airway pressure titration.  I explained the sleep test procedure to the patient and also outlined possible surgical and non-surgical treatment options of OSA, including the use of a custom-made dental device (which would require a referral to a specialist dentist or oral surgeon), upper airway surgical options, such as pillar implants, radiofrequency surgery, tongue base surgery, and UPPP (which would involve a referral to an ENT surgeon). Rarely, jaw surgery such as mandibular advancement may be considered.  I also explained the CPAP treatment option to the patient, who indicated that she  would be willing to try CPAP if the need arises. I explained the importance of being compliant with PAP treatment, not only for insurance purposes but primarily to improve Her symptoms, and for the patient's long term health benefit, including to reduce Her cardiovascular risks. I answered all  her  questions today and the patient and  her husband  were in agreement. I would like to see  her  back after the sleep study is completed and encouraged  her  to call with any interim  questions, concerns, problems or updates.   Thank you very much for allowing me to participate in the care of this nice patient. If I can be of any further assistance to you please do not hesitate to call me at [phone].  Sincerely,   Natalie Age, MD, PhD

## 2014-08-08 NOTE — Patient Instructions (Signed)
We will do a sleep study and take it from there. Your BP elevations may be in part due to a sleep disorder, but also due to chronic pain. Please talk to Dr. Philip Aspen about alternative BP meds, if you cannot tolerate the current meds.

## 2014-09-17 ENCOUNTER — Ambulatory Visit (INDEPENDENT_AMBULATORY_CARE_PROVIDER_SITE_OTHER): Payer: BC Managed Care – PPO

## 2014-09-17 DIAGNOSIS — M549 Dorsalgia, unspecified: Secondary | ICD-10-CM

## 2014-09-17 DIAGNOSIS — R51 Headache: Secondary | ICD-10-CM

## 2014-09-17 DIAGNOSIS — G8929 Other chronic pain: Secondary | ICD-10-CM

## 2014-09-17 DIAGNOSIS — I10 Essential (primary) hypertension: Secondary | ICD-10-CM

## 2014-09-17 DIAGNOSIS — G478 Other sleep disorders: Secondary | ICD-10-CM

## 2014-09-17 DIAGNOSIS — G4733 Obstructive sleep apnea (adult) (pediatric): Secondary | ICD-10-CM

## 2014-09-17 DIAGNOSIS — G2581 Restless legs syndrome: Secondary | ICD-10-CM

## 2014-09-17 DIAGNOSIS — G4710 Hypersomnia, unspecified: Secondary | ICD-10-CM

## 2014-09-17 DIAGNOSIS — R519 Headache, unspecified: Secondary | ICD-10-CM

## 2014-09-26 ENCOUNTER — Telehealth: Payer: Self-pay | Admitting: Neurology

## 2014-09-26 DIAGNOSIS — G4733 Obstructive sleep apnea (adult) (pediatric): Secondary | ICD-10-CM

## 2014-09-26 NOTE — Telephone Encounter (Signed)
Please call and notify the patient that the recent sleep study did confirm the diagnosis of obstructive sleep apnea and that I recommend treatment for this in the form of CPAP, Due to her recent increases in blood pressure values, and her sleep-related complaints, as well as desaturations to the low 80s. This will require a repeat sleep study for proper titration and mask fitting. Please explain to patient and arrange for a CPAP titration study. I have placed an order in the chart. Thanks, [name], MD, PhD [facility])

## 2014-10-03 NOTE — Telephone Encounter (Signed)
Patient was contacted and provided the results of her sleep study that revealed sleep apnea.  Patient was advised a CPAP titration study had been recommended.  Patient was informed that her insurance covered the study at 100%.  Patient scheduled her sleep study for Nov. 30 th at 9:30 pm.  Patient was mailed a copy of the results and a copy was faxed to Dr. Philip Aspen.

## 2014-10-30 ENCOUNTER — Other Ambulatory Visit: Payer: Self-pay | Admitting: Internal Medicine

## 2014-10-30 ENCOUNTER — Other Ambulatory Visit: Payer: Self-pay

## 2014-10-30 DIAGNOSIS — Z1231 Encounter for screening mammogram for malignant neoplasm of breast: Secondary | ICD-10-CM

## 2014-10-30 DIAGNOSIS — G4733 Obstructive sleep apnea (adult) (pediatric): Secondary | ICD-10-CM | POA: Insufficient documentation

## 2014-12-04 ENCOUNTER — Ambulatory Visit: Payer: BC Managed Care – PPO

## 2014-12-12 ENCOUNTER — Ambulatory Visit
Admission: RE | Admit: 2014-12-12 | Discharge: 2014-12-12 | Disposition: A | Discharge status: Home / Self Care | Payer: BLUE CROSS/BLUE SHIELD | Source: Ambulatory Visit | Attending: Internal Medicine | Admitting: Internal Medicine

## 2014-12-12 ENCOUNTER — Ambulatory Visit: Payer: No Typology Code available for payment source

## 2014-12-12 DIAGNOSIS — Z1231 Encounter for screening mammogram for malignant neoplasm of breast: Secondary | ICD-10-CM

## 2015-01-22 DIAGNOSIS — I10 Essential (primary) hypertension: Secondary | ICD-10-CM | POA: Insufficient documentation

## 2015-03-27 DIAGNOSIS — R319 Hematuria, unspecified: Secondary | ICD-10-CM | POA: Insufficient documentation

## 2015-03-27 DIAGNOSIS — R0602 Shortness of breath: Secondary | ICD-10-CM | POA: Insufficient documentation

## 2015-04-13 ENCOUNTER — Encounter: Payer: Self-pay | Admitting: Vascular Surgery

## 2015-04-13 ENCOUNTER — Other Ambulatory Visit: Payer: Self-pay | Admitting: *Deleted

## 2015-04-13 DIAGNOSIS — M7989 Other specified soft tissue disorders: Secondary | ICD-10-CM

## 2015-04-30 ENCOUNTER — Telehealth: Payer: Self-pay | Admitting: Vascular Surgery

## 2015-04-30 NOTE — Telephone Encounter (Signed)
Nothing available at this time. I spoke with pt to assure her that she was on our cancellation/wait list.

## 2015-04-30 NOTE — Telephone Encounter (Signed)
-----   Message from Denman George, RN sent at 04/29/2015  9:49 AM EDT ----- Regarding: req. earlier appt. date Contact: [phone] This pt. is requesting to have appt. moved to earlier date.  Has there been any cancellations?  Reported swelling in bil. LE, and recently went to [facility] in [place] for an infection in the left leg; asking to be worked in sooner.  I advised her we will check for cancellations, and to go back to ER if symptoms worsen prior to appt.

## 2015-05-04 ENCOUNTER — Encounter: Payer: Self-pay | Admitting: Vascular Surgery

## 2015-05-07 ENCOUNTER — Ambulatory Visit (HOSPITAL_COMMUNITY)
Admission: RE | Admit: 2015-05-07 | Discharge: 2015-05-07 | Disposition: A | Discharge status: Home / Self Care | Payer: BLUE CROSS/BLUE SHIELD | Source: Ambulatory Visit | Attending: Vascular Surgery | Admitting: Vascular Surgery

## 2015-05-07 ENCOUNTER — Encounter: Payer: Self-pay | Admitting: Vascular Surgery

## 2015-05-07 ENCOUNTER — Ambulatory Visit (INDEPENDENT_AMBULATORY_CARE_PROVIDER_SITE_OTHER): Payer: BLUE CROSS/BLUE SHIELD | Admitting: Vascular Surgery

## 2015-05-07 VITALS — BP 194/90 | HR 60 | Resp 16 | Ht 67.0 in | Wt 196.0 lb

## 2015-05-07 DIAGNOSIS — M7989 Other specified soft tissue disorders: Secondary | ICD-10-CM | POA: Diagnosis not present

## 2015-05-07 NOTE — Progress Notes (Signed)
HISTORY AND PHYSICAL     CC:  Leg swelling left > right  Referring Provider:  Leanna Battles, MD  HPI: This is a 59 y.o. female who presents today for bilateral leg swelling with the left greater than right.  She states this has been going on for about a year, however, this has worsened over the past 5-6 months.    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  She states that she has tried elevation, but this does not relieve any of the swelling.  She had seen a cardiologist at [facility] who placed her on Lasix and an ARB.  She states that the Lasix did not help her swelling (it did help her BP) and she feels like the swelling worsened so she discontinued the Lasix and ARB.  She does take her Metoprolol and Clonidine for her HTN.    She states that she has tried compression stockings, however, this broke her into a rash on her legs (left > right).  She also had weeping with this cellulitis.   She went to the ER and was prescribed Keflex and the cellulitis cleared.   She states that she is having swelling around her eyes and on both arms as well.  She states she does not have a known allergy to latex.   She also had ultrasound that revealed no DVT.  She has no known hx of DVT.  She has also tried other types of socks with no relief.  She has family hx of varicose veins with her father.  Past Medical History  Diagnosis Date  . Hypertension   . High cholesterol     Past Surgical History  Procedure Laterality Date  . Partial hysterectomy    . Back surgery    . Wrist surgery    . Cataract extraction, bilateral    . Shoulder surgery      Allergies  Allergen Reactions  . Dilaudid [Hydromorphone Hcl]   . Penicillins     Current Outpatient Prescriptions  Medication Sig Dispense Refill  . cloNIDine (CATAPRES) 0.1 MG tablet Take 0.1 mg by mouth 2 (two) times daily. If BP is over 160, take one tab and two more toprol    . metoprolol succinate (TOPROL-XL) 50 MG 24 hr tablet  Take 50 mg by mouth 4 (four) times daily. Take with or immediately following a meal.     No current facility-administered medications for this visit.    Family History  Problem Relation Age of Onset  . Cancer Mother   . Diabetes Mother   . Heart disease Mother   . Hyperlipidemia Mother   . Hypertension Mother   . Cancer Father   . Diabetes Father   . Heart disease Father   . Hyperlipidemia Father   . Hypertension Father   . Varicose Veins Father   . Cancer Sister   . Heart disease Sister   . Hyperlipidemia Sister   . Cancer Brother   . Diabetes Brother   . Hyperlipidemia Brother      History   Social History  . Marital Status: Married    Spouse Name: marty  . Number of Children: 2  . Years of Education: college   Occupational History  . [employer]    Social History Main Topics  . Smoking status: Never Smoker   . Smokeless tobacco: Not on file  . Alcohol Use: Yes  . Drug Use: No  . Sexual Activity: Not on file  Other Topics Concern  . Not on file   Social History Narrative     ROS: [x]  Positive   [ ]  Negative   [ ]  All sytems reviewed and are negative  Cardiovascular: [x]  chest pressure []  palpitations [x]  SOB lying flat [x]  DOE [x]  pain in legs while walking [x]  pain in feet when lying flat []  hx of DVT [x]  hx of phlebitis [x]  swelling in legs-left > right  [x]  varicose veins  Pulmonary: []  productive cough []  asthma []  wheezing [x]  recent OSA dx-does not use CPAP-expensive  Neurologic: [x]  weakness in []  arms [x]  legs [x]  numbness in []  arms [x]  legs [] difficulty speaking or slurred speech []  temporary loss of vision in one eye []  dizziness  Hematologic: []  bleeding problems []  problems with blood clotting easily  GI []  vomiting blood []  blood in stool  GU: []  burning with urination []  blood in urine  Psychiatric: []  hx of major depression  Integumentary: [x]  rashes-recent cellulitis after wearing compression; rash  on arms [x]  ulcers  Constitutional: []  fever [x]  chills   PHYSICAL EXAMINATION:  Filed Vitals:   05/07/15 1159  BP: 194/90  Pulse: 60  Resp:    Body mass index is 30.69 kg/(m^2).  General:  WDWN in NAD Gait: Normal HENT: WNL, normocephalic Pulmonary: normal non-labored breathing , without Rales, rhonchi,  wheezing Cardiac: RRR, without  Murmurs, rubs or gallops; without carotid bruits Abdomen: soft, NT, no masses Skin: with rashes BLE with left > right and bilateral arms right > left;, without ulcers  Vascular Exam/Pulses:  Right Left  Radial 2+ (normal) 2+ (normal)  Ulnar 2+ (normal) 2+ (normal)  Popliteal Unable to palpate  Unable to palpate   DP 2+ (normal) 2+ (normal)  PT Unable to palpate  Unable to palpate    Extremities: without ischemic changes, without Gangrene , without cellulitis; without open wounds;  Musculoskeletal: no muscle wasting or atrophy  Neurologic: A&O X 3; Appropriate Affect ; SENSATION: normal; MOTOR FUNCTION:  moving all extremities equally. Speech is fluent/normal   Non-Invasive Vascular Imaging:   Lower Extremity Venous Duplex 05/07/15: 1.  No evidence of DVT in the bilateral femoral-popliteal venous systems. 2.  No evidence of superficial vein thrombosis noted in the bilateral lower extremities 3.  Venous incompetence is noted in the bilateral common femoral veins and left popliteal veins.  Pt meds includes: Statin:  No. Beta Blocker:  Yes.   Aspirin:  No. ACEI:  No. ARB:  No. Other Antiplatelet/Anticoagulant:  No.    ASSESSMENT/PLAN:: 59 y.o. female with bilateral leg swelling with the left greater than right   -pt has noted worsening of swelling over the past 5-6 months with no relief from elevation or diuretics. -she has tried compression stockings, however, she developed a cellulitis from them that did clear with Keflex -she is scheduled for an ascending venogram of the left iliac vein with possible angioplasty and possible  stenting in the near future.  She understands that if she does have a stent placed, that she will have to be on lifelong anticoagulants and wishes to proceed. -she is encouraged to continue elevation -given the severe cellulitis that she developed after the compression stockings and the rash of her arms and swelling around her eyes, she may have a latex allergy and may want to follow up with a dermatologist, which I discussed with the pt   Leontine Locket, PA-C [facility] [phone]  Clinic MD:  Pt seen and examined in conjunction with  Dr. Oneida Alar  History and exam details as above. Patient does have evidence of deep vein reflux bilaterally. However her left leg is more swollen than her right. I believe she warrants a venogram to make sure she does not have an iliac vein stenosis on the left side. She possibly would need stenting of this. We will schedule this for early July. Risks benefits possible complications and procedure details were expanding the patient today including but not limited to bleeding infection contrast reaction need for chronic anticoagulation. She understands and agrees to proceed.  Ruta Hinds, MD [facility] [place] Office: [phone] Pager: [phone]

## 2015-05-07 NOTE — Progress Notes (Signed)
Filed Vitals:   05/07/15 1157 05/07/15 1159  BP: 196/92 194/90  Pulse: 55 60  Resp: 16   Height: 5\' 7"  (1.702 m)   Weight: 196 lb (88.905 kg)

## 2015-05-27 ENCOUNTER — Other Ambulatory Visit: Payer: Self-pay

## 2015-05-28 ENCOUNTER — Encounter (HOSPITAL_COMMUNITY)
Admission: RE | Admit: 2015-05-28 | Discharge: 2015-05-28 | Disposition: A | Discharge status: Home / Self Care | Payer: BLUE CROSS/BLUE SHIELD | Source: Ambulatory Visit | Attending: Vascular Surgery | Admitting: Vascular Surgery

## 2015-05-28 ENCOUNTER — Encounter (HOSPITAL_COMMUNITY): Payer: Self-pay

## 2015-05-28 DIAGNOSIS — Z01818 Encounter for other preprocedural examination: Secondary | ICD-10-CM | POA: Insufficient documentation

## 2015-05-28 DIAGNOSIS — Z01812 Encounter for preprocedural laboratory examination: Secondary | ICD-10-CM | POA: Diagnosis not present

## 2015-05-28 DIAGNOSIS — R001 Bradycardia, unspecified: Secondary | ICD-10-CM | POA: Diagnosis not present

## 2015-05-28 DIAGNOSIS — Z79899 Other long term (current) drug therapy: Secondary | ICD-10-CM | POA: Insufficient documentation

## 2015-05-28 DIAGNOSIS — I10 Essential (primary) hypertension: Secondary | ICD-10-CM | POA: Diagnosis not present

## 2015-05-28 DIAGNOSIS — G4733 Obstructive sleep apnea (adult) (pediatric): Secondary | ICD-10-CM | POA: Insufficient documentation

## 2015-05-28 DIAGNOSIS — M7989 Other specified soft tissue disorders: Secondary | ICD-10-CM | POA: Insufficient documentation

## 2015-05-28 DIAGNOSIS — E785 Hyperlipidemia, unspecified: Secondary | ICD-10-CM | POA: Insufficient documentation

## 2015-05-28 DIAGNOSIS — K219 Gastro-esophageal reflux disease without esophagitis: Secondary | ICD-10-CM | POA: Diagnosis not present

## 2015-05-28 HISTORY — DX: Headache, unspecified: R51.9

## 2015-05-28 HISTORY — DX: Personal history of urinary calculi: Z87.442

## 2015-05-28 HISTORY — DX: Personal history of other diseases of the respiratory system: Z87.09

## 2015-05-28 HISTORY — DX: Gastro-esophageal reflux disease without esophagitis: K21.9

## 2015-05-28 HISTORY — DX: Cerebral infarction, unspecified: I63.9

## 2015-05-28 HISTORY — DX: Malignant (primary) neoplasm, unspecified: C80.1

## 2015-05-28 HISTORY — DX: Sleep apnea, unspecified: G47.30

## 2015-05-28 HISTORY — DX: Unspecified osteoarthritis, unspecified site: M19.90

## 2015-05-28 HISTORY — DX: Headache: R51

## 2015-05-28 LAB — POCT I-STAT, CHEM 8
BUN: 21 mg/dL — AB (ref 6–20)
Calcium, Ion: 1.15 mmol/L (ref 1.12–1.23)
Chloride: 102 mmol/L (ref 101–111)
Creatinine, Ser: 1 mg/dL (ref 0.44–1.00)
GLUCOSE: 95 mg/dL (ref 65–99)
HCT: 39 % (ref 36.0–46.0)
HEMOGLOBIN: 13.3 g/dL (ref 12.0–15.0)
Potassium: 4.1 mmol/L (ref 3.5–5.1)
Sodium: 139 mmol/L (ref 135–145)
TCO2: 26 mmol/L (ref 0–100)

## 2015-05-28 LAB — SURGICAL PCR SCREEN
MRSA, PCR: POSITIVE — AB
Staphylococcus aureus: POSITIVE — AB

## 2015-05-28 MED ORDER — CHLORHEXIDINE GLUCONATE 4 % EX LIQD: 60.0000 mL | Freq: Once | CUTANEOUS | Status: DC

## 2015-05-28 NOTE — Progress Notes (Signed)
PCP- Dr. Philip Aspen Cardiologist- Dr. Maylon Peppers at [facility] for Cardiac Cath and Echo in March 2016; requested results CXR- denies EKG - Epic

## 2015-05-28 NOTE — Pre-Procedure Instructions (Addendum)
    ROSEMAE MCQUOWN  05/28/2015      [id number] - [place], [place] - [address] [place] [address] Phone: [phone] Fax: [phone]    Your procedure is scheduled on Monday, July 11th, 2016 at 7:30  Report to [place] at 5:30 A.M.  Call this number if you have problems the morning of surgery:  [phone]   Remember:  Do not eat food or drink liquids after midnight.   Take these medicines the morning of surgery with A SIP OF WATER Metoprolol Succinate (Toprol-XL), Clonidine (Catapres), Cyclobenzaprine (Flexeril), Hydrocodone-acetaminophen (Norco/Vicodin)  Stop taking: Meloxicam (Mobic), Excedrin Migraine, Aspirin, Aleve, Naproxen, BC's, Goody's, Ibuprofen, fish oil, all herbal medications, all vitamins starting Monday, July 4th.   Do not wear jewelry, make-up or nail polish.  Do not wear lotions, powders, or perfumes.  You may wear deodorant.  Do not shave 48 hours prior to surgery.    Do not bring valuables to the hospital.  [facility] is not responsible for any belongings or valuables.  Contacts, dentures or bridgework may not be worn into surgery.  Leave your suitcase in the car.  After surgery it may be brought to your room.  For patients admitted to the hospital, discharge time will be determined by your treatment team.  Patients discharged the day of surgery will not be allowed to drive home.   Special instructions:  See attached.  Please read over the following fact sheets that you were given. Pain Booklet, Coughing and Deep Breathing, MRSA Information and Surgical Site Infection Prevention

## 2015-05-28 NOTE — Progress Notes (Signed)
Surgical PCR positive.  Left message for patient.  Called prescription to [facility].

## 2015-05-29 ENCOUNTER — Encounter (HOSPITAL_COMMUNITY): Payer: Self-pay

## 2015-05-29 NOTE — Progress Notes (Signed)
Anesthesia Chart Review: Patient is a 59 year old female scheduled for ascending venogram of left iliac vein, possible angioplasty, possible stenting on 06/08/15 by Dr. Oneida Alar.  History includes BLE edema (L >R) with BLE cellulitis (after compression stocking use) s/p abx, HTN, HLD, non-smoker, GERD, nephrolithiasis, skin cancer, OSA without CPAP, SOB, possible TIA '14, back surgery. PCP is Dr. Leanna Battles. Cardiologist is Dr. Maylon Peppers at [facility] (seen for HTN, edema).  Meds include Excedrin Migraine, Catapres, Flexeril, Norco, Mobic, Lopressor.  05/28/15 EKG: SB at 51 bpm.  01/30/15 Echo [facility]): LV size is normal. LV systolic function is low normal, LVEF 50-55%. RV is normal in size and function. No pericardial effusion. No significant valvular regurgitation or stenosis (trace AR/MR/TR/PR).  01/27/15 Cardiac cath [facility]): LVEF 60%. Right dominant. Angiographically normal coronaries.   05/07/15 Lower Extremity Venous Duplex: 1. No evidence of DVT in the bilateral femoral-popliteal venous systems. 2. No evidence of superficial vein thrombosis noted in the bilateral lower extremities 3. Venous incompetence is noted in the bilateral common femoral veins and left popliteal veins.  ISTAT 8 results noted.   If no acute changes and if BP is reasonable on arrival then I would anticipate that she could proceed as planned. She is scheduled to take Catapress and Lopressor on the morning of her procedure.   [facility]/Anesthesiology Phone [phone] 05/29/2015 1:12 PM

## 2015-06-07 NOTE — Anesthesia Preprocedure Evaluation (Addendum)
Anesthesia Evaluation  Patient identified by MRN, date of birth, ID band Patient awake    Reviewed: Allergy & Precautions, NPO status , Patient's Chart, lab work & pertinent test results, reviewed documented beta blocker date and time   Airway Mallampati: II  TM Distance: >3 FB Neck ROM: Full    Dental no notable dental hx.    Pulmonary shortness of breath, sleep apnea ,  breath sounds clear to auscultation  Pulmonary exam normal       Cardiovascular hypertension, Pt. on medications and Pt. on home beta blockers - CAD Normal cardiovascular examRhythm:Regular Rate:Normal     Neuro/Psych  Headaches, CVA negative psych ROS   GI/Hepatic Neg liver ROS, GERD-  ,  Endo/Other  negative endocrine ROS  Renal/GU negative Renal ROS     Musculoskeletal  (+) Arthritis -,   Abdominal   Peds  Hematology negative hematology ROS (+)   Anesthesia Other Findings   Reproductive/Obstetrics negative OB ROS                            Anesthesia Physical Anesthesia Plan  ASA: III  Anesthesia Plan: General   Post-op Pain Management:    Induction: Intravenous  Airway Management Planned: Oral ETT  Additional Equipment: None  Intra-op Plan:   Post-operative Plan: Extubation in OR  Informed Consent: I have reviewed the patients History and Physical, chart, labs and discussed the procedure including the risks, benefits and alternatives for the proposed anesthesia with the patient or authorized representative who has indicated his/her understanding and acceptance.   Dental advisory given  Plan Discussed with: CRNA  Anesthesia Plan Comments:         Anesthesia Quick Evaluation

## 2015-06-08 ENCOUNTER — Encounter (HOSPITAL_COMMUNITY)
Admission: RE | Disposition: A | Discharge status: Home / Self Care | Payer: Self-pay | Source: Ambulatory Visit | Attending: Vascular Surgery

## 2015-06-08 ENCOUNTER — Encounter (HOSPITAL_COMMUNITY): Payer: Self-pay | Admitting: *Deleted

## 2015-06-08 ENCOUNTER — Ambulatory Visit (HOSPITAL_COMMUNITY): Payer: BLUE CROSS/BLUE SHIELD | Admitting: Anesthesiology

## 2015-06-08 ENCOUNTER — Ambulatory Visit (HOSPITAL_COMMUNITY)
Admission: RE | Admit: 2015-06-08 | Discharge: 2015-06-08 | Disposition: A | Discharge status: Home / Self Care | Payer: BLUE CROSS/BLUE SHIELD | Source: Ambulatory Visit | Attending: Vascular Surgery | Admitting: Vascular Surgery

## 2015-06-08 ENCOUNTER — Ambulatory Visit (HOSPITAL_COMMUNITY): Payer: BLUE CROSS/BLUE SHIELD | Admitting: Vascular Surgery

## 2015-06-08 DIAGNOSIS — Z8673 Personal history of transient ischemic attack (TIA), and cerebral infarction without residual deficits: Secondary | ICD-10-CM | POA: Insufficient documentation

## 2015-06-08 DIAGNOSIS — G4730 Sleep apnea, unspecified: Secondary | ICD-10-CM | POA: Diagnosis not present

## 2015-06-08 DIAGNOSIS — K219 Gastro-esophageal reflux disease without esophagitis: Secondary | ICD-10-CM | POA: Insufficient documentation

## 2015-06-08 DIAGNOSIS — I2510 Atherosclerotic heart disease of native coronary artery without angina pectoris: Secondary | ICD-10-CM | POA: Diagnosis not present

## 2015-06-08 DIAGNOSIS — I10 Essential (primary) hypertension: Secondary | ICD-10-CM | POA: Diagnosis not present

## 2015-06-08 DIAGNOSIS — M1990 Unspecified osteoarthritis, unspecified site: Secondary | ICD-10-CM | POA: Insufficient documentation

## 2015-06-08 DIAGNOSIS — Z79899 Other long term (current) drug therapy: Secondary | ICD-10-CM | POA: Diagnosis not present

## 2015-06-08 DIAGNOSIS — I878 Other specified disorders of veins: Secondary | ICD-10-CM | POA: Insufficient documentation

## 2015-06-08 DIAGNOSIS — M7989 Other specified soft tissue disorders: Secondary | ICD-10-CM | POA: Diagnosis present

## 2015-06-08 HISTORY — PX: LOWER EXTREMITY ANGIOGRAM: SHX5508

## 2015-06-08 SURGERY — ANGIOGRAM, LOWER EXTREMITY
Anesthesia: General | Site: Groin | Laterality: Left

## 2015-06-08 MED ORDER — SODIUM CHLORIDE 0.9 % IR SOLN
Status: DC | PRN
  Administered 2015-06-08: 500 mL

## 2015-06-08 MED ORDER — PROMETHAZINE HCL 25 MG/ML IJ SOLN: 6.2500 mg | INTRAMUSCULAR | Status: DC | PRN

## 2015-06-08 MED ORDER — ONDANSETRON HCL 4 MG/2ML IJ SOLN
INTRAMUSCULAR | Status: DC | PRN
Start: 2015-06-08 — End: 2015-06-08
  Administered 2015-06-08: 4 mg via INTRAVENOUS

## 2015-06-08 MED ORDER — IOHEXOL 300 MG/ML  SOLN
INTRAMUSCULAR | Status: DC | PRN
  Administered 2015-06-08: 34 mL via INTRAVENOUS

## 2015-06-08 MED ORDER — EPHEDRINE SULFATE 50 MG/ML IJ SOLN
INTRAMUSCULAR | Status: AC
  Filled 2015-06-08: qty 1

## 2015-06-08 MED ORDER — PROPOFOL 10 MG/ML IV BOLUS
INTRAVENOUS | Status: AC
  Filled 2015-06-08: qty 20

## 2015-06-08 MED ORDER — SUCCINYLCHOLINE CHLORIDE 20 MG/ML IJ SOLN
INTRAMUSCULAR | Status: DC | PRN
  Administered 2015-06-08: 100 mg via INTRAVENOUS

## 2015-06-08 MED ORDER — SUCCINYLCHOLINE CHLORIDE 20 MG/ML IJ SOLN
INTRAMUSCULAR | Status: AC
  Filled 2015-06-08: qty 1

## 2015-06-08 MED ORDER — PROPOFOL 10 MG/ML IV BOLUS
INTRAVENOUS | Status: DC | PRN
Start: 2015-06-08 — End: 2015-06-08
  Administered 2015-06-08: 150 mg via INTRAVENOUS

## 2015-06-08 MED ORDER — ONDANSETRON HCL 4 MG/2ML IJ SOLN
INTRAMUSCULAR | Status: AC
  Filled 2015-06-08: qty 2

## 2015-06-08 MED ORDER — DEXAMETHASONE SODIUM PHOSPHATE 4 MG/ML IJ SOLN
INTRAMUSCULAR | Status: AC
  Filled 2015-06-08: qty 1

## 2015-06-08 MED ORDER — MIDAZOLAM HCL 5 MG/5ML IJ SOLN
INTRAMUSCULAR | Status: DC | PRN
Start: 2015-06-08 — End: 2015-06-08
  Administered 2015-06-08: 2 mg via INTRAVENOUS

## 2015-06-08 MED ORDER — MIDAZOLAM HCL 2 MG/2ML IJ SOLN
INTRAMUSCULAR | Status: AC
  Filled 2015-06-08: qty 2

## 2015-06-08 MED ORDER — ROCURONIUM BROMIDE 50 MG/5ML IV SOLN
INTRAVENOUS | Status: AC
  Filled 2015-06-08: qty 1

## 2015-06-08 MED ORDER — DEXAMETHASONE SODIUM PHOSPHATE 4 MG/ML IJ SOLN
INTRAMUSCULAR | Status: DC | PRN
  Administered 2015-06-08: 4 mg via INTRAVENOUS

## 2015-06-08 MED ORDER — 0.9 % SODIUM CHLORIDE (POUR BTL) OPTIME
TOPICAL | Status: DC | PRN
  Administered 2015-06-08: 1000 mL

## 2015-06-08 MED ORDER — VANCOMYCIN HCL IN DEXTROSE 1-5 GM/200ML-% IV SOLN
1000.0000 mg | INTRAVENOUS | Status: AC
  Administered 2015-06-08: 1000 mg via INTRAVENOUS

## 2015-06-08 MED ORDER — FENTANYL CITRATE (PF) 100 MCG/2ML IJ SOLN: 25.0000 ug | INTRAMUSCULAR | Status: DC | PRN

## 2015-06-08 MED ORDER — LACTATED RINGERS IV SOLN
INTRAVENOUS | Status: DC | PRN
  Administered 2015-06-08: 07:00:00 via INTRAVENOUS

## 2015-06-08 MED ORDER — SODIUM CHLORIDE 0.9 % IV SOLN: INTRAVENOUS | Status: DC

## 2015-06-08 MED ORDER — LIDOCAINE HCL (CARDIAC) 20 MG/ML IV SOLN
INTRAVENOUS | Status: DC | PRN
  Administered 2015-06-08: 100 mg via INTRAVENOUS

## 2015-06-08 MED ORDER — STERILE WATER FOR INJECTION IJ SOLN
INTRAMUSCULAR | Status: AC
  Filled 2015-06-08: qty 10

## 2015-06-08 MED ORDER — FENTANYL CITRATE (PF) 250 MCG/5ML IJ SOLN
INTRAMUSCULAR | Status: AC
  Filled 2015-06-08: qty 5

## 2015-06-08 MED ORDER — FENTANYL CITRATE (PF) 100 MCG/2ML IJ SOLN
INTRAMUSCULAR | Status: DC | PRN
  Administered 2015-06-08: 100 ug via INTRAVENOUS

## 2015-06-08 MED ORDER — MEPERIDINE HCL 25 MG/ML IJ SOLN: 6.2500 mg | INTRAMUSCULAR | Status: DC | PRN

## 2015-06-08 SURGICAL SUPPLY — 64 items
BANDAGE ESMARK 6X9 LF (GAUZE/BANDAGES/DRESSINGS) IMPLANT
BNDG CMPR 9X6 STRL LF SNTH (GAUZE/BANDAGES/DRESSINGS)
BNDG ESMARK 6X9 LF (GAUZE/BANDAGES/DRESSINGS)
CANISTER SUCTION 2500CC (MISCELLANEOUS) ×3 IMPLANT
CATH VISIONS PV .035 IVUS (CATHETERS) ×2 IMPLANT
CLIP TI MEDIUM 24 (CLIP) ×3 IMPLANT
CLIP TI WIDE RED SMALL 24 (CLIP) ×3 IMPLANT
CORONARY SUCKER SOFT TIP 10052 (MISCELLANEOUS) IMPLANT
COVER PROBE W GEL 5X96 (DRAPES) ×2 IMPLANT
CUFF TOURNIQUET SINGLE 24IN (TOURNIQUET CUFF) IMPLANT
CUFF TOURNIQUET SINGLE 34IN LL (TOURNIQUET CUFF) IMPLANT
CUFF TOURNIQUET SINGLE 44IN (TOURNIQUET CUFF) IMPLANT
DECANTER SPIKE VIAL GLASS SM (MISCELLANEOUS) IMPLANT
DRAIN SNY WOU (WOUND CARE) IMPLANT
DRAPE PROXIMA HALF (DRAPES) ×2 IMPLANT
DRAPE X-RAY CASS 24X20 (DRAPES) IMPLANT
DRSG COVADERM 4X10 (GAUZE/BANDAGES/DRESSINGS) IMPLANT
DRSG COVADERM 4X8 (GAUZE/BANDAGES/DRESSINGS) IMPLANT
DRSG TEGADERM 2-3/8X2-3/4 SM (GAUZE/BANDAGES/DRESSINGS) ×2 IMPLANT
ELECT REM PT RETURN 9FT ADLT (ELECTROSURGICAL) ×3
ELECTRODE REM PT RTRN 9FT ADLT (ELECTROSURGICAL) ×2 IMPLANT
EVACUATOR SILICONE 100CC (DRAIN) IMPLANT
GAUZE SPONGE 2X2 8PLY STRL LF (GAUZE/BANDAGES/DRESSINGS) ×1 IMPLANT
GAUZE SPONGE 4X4 12PLY STRL (GAUZE/BANDAGES/DRESSINGS) ×3 IMPLANT
GLOVE BIO SURGEON STRL SZ7.5 (GLOVE) ×3 IMPLANT
GOWN STRL REUS W/ TWL LRG LVL3 (GOWN DISPOSABLE) ×6 IMPLANT
GOWN STRL REUS W/TWL LRG LVL3 (GOWN DISPOSABLE) ×9
KIT BASIN OR (CUSTOM PROCEDURE TRAY) ×3 IMPLANT
KIT ENCORE 26 ADVANTAGE (KITS) ×2 IMPLANT
KIT ROOM TURNOVER OR (KITS) ×3 IMPLANT
LIQUID BAND (GAUZE/BANDAGES/DRESSINGS) IMPLANT
NDL PERC 18GX7CM (NEEDLE) IMPLANT
NEEDLE PERC 18GX7CM (NEEDLE) ×3 IMPLANT
NS IRRIG 1000ML POUR BTL (IV SOLUTION) ×6 IMPLANT
PACK PERIPHERAL VASCULAR (CUSTOM PROCEDURE TRAY) ×3 IMPLANT
PAD ARMBOARD 7.5X6 YLW CONV (MISCELLANEOUS) ×6 IMPLANT
PADDING CAST COTTON 6X4 STRL (CAST SUPPLIES) IMPLANT
PROTECTION STATION PRESSURIZED (MISCELLANEOUS) ×3
SET COLLECT BLD 21X3/4 12 (NEEDLE) IMPLANT
SHEATH BRITE TIP 7FRX11 (SHEATH) ×2 IMPLANT
SHEATH FAST CATH 10F 12CM (SHEATH) ×2 IMPLANT
SPONGE GAUZE 2X2 STER 10/PKG (GAUZE/BANDAGES/DRESSINGS) ×1
SPONGE SURGIFOAM ABS GEL 100 (HEMOSTASIS) IMPLANT
STAPLER VISISTAT 35W (STAPLE) IMPLANT
STATION PROTECTION PRESSURIZED (MISCELLANEOUS) ×1 IMPLANT
STOPCOCK 4 WAY LG BORE MALE ST (IV SETS) IMPLANT
SUT PROLENE 5 0 C 1 24 (SUTURE) ×3 IMPLANT
SUT PROLENE 6 0 CC (SUTURE) ×3 IMPLANT
SUT PROLENE 7 0 BV 1 (SUTURE) IMPLANT
SUT PROLENE 7 0 BV1 MDA (SUTURE) IMPLANT
SUT SILK 2 0 SH (SUTURE) ×3 IMPLANT
SUT SILK 3 0 (SUTURE)
SUT SILK 3-0 18XBRD TIE 12 (SUTURE) IMPLANT
SUT VIC AB 2-0 CTX 36 (SUTURE) ×6 IMPLANT
SUT VIC AB 3-0 SH 27 (SUTURE) ×6
SUT VIC AB 3-0 SH 27X BRD (SUTURE) ×4 IMPLANT
SUT VIC AB 4-0 PS2 27 (SUTURE) IMPLANT
SYRINGE 20CC LL (MISCELLANEOUS) ×4 IMPLANT
TAPE UMBILICAL COTTON 1/8X30 (MISCELLANEOUS) IMPLANT
TRAY FOLEY W/METER SILVER 16FR (SET/KITS/TRAYS/PACK) ×3 IMPLANT
TUBING EXTENTION W/L.L. (IV SETS) IMPLANT
UNDERPAD 30X30 INCONTINENT (UNDERPADS AND DIAPERS) ×3 IMPLANT
WATER STERILE IRR 1000ML POUR (IV SOLUTION) ×3 IMPLANT
WIRE BENTSON .035X145CM (WIRE) ×2 IMPLANT

## 2015-06-08 NOTE — Discharge Instructions (Signed)
Return To [facility] ___________________[name]______________________________ was treated at our facility. INJURY OR ILLNESS WAS: _____ [facility]-related _____ Not [facility]-related _____ Undetermined if [facility]-related RETURN TO [facility]  Employee may return to [facility] on: _________Thursday 7/14/2016_______  Employee may return to modified [facility] on: ____________________ [facility] activities not tolerated include: _____ Bending _____ Prolonged sitting _____ Lifting _____ Squatting _____ Prolonged standing _____ [facility] _____ Reaching _____ Pushing and pulling _____ Walking _____ Other ____________________ Show this Return to [facility] statement to Optician, dispensing at [facility] as soon as possible. Your employer should be aware of your condition and can help with the necessary [facility] activity restrictions. If you wish to return to [facility] sooner than the date above, or if you have further problems which make it difficult for you to return at that time, please call us or your caregiver. _________________________________________ Physician Name (Printed) _________________________________________ Physician Signature  _________________________________________ Date Document Released: 11/14/2005 Document Revised: 02/06/2012 Document Reviewed: 05/01/2007 ExitCare Patient Information 2015 [place], [place]. This information is not intended to replace advice given to you by your health care provider. Make sure you discuss any questions you have with your health care provider. Venogram, Care After Refer to this sheet in the next few weeks. These instructions provide you with information on caring for yourself after your procedure. Your health care provider may also give you more specific instructions. Your treatment has been planned according to current medical practices, but problems sometimes occur. Call your health care provider if you have any problems or questions after your procedure. WHAT TO EXPECT AFTER  THE PROCEDURE After your procedure, it is typical to have the following sensations:  Mild discomfort at the catheter insertion site. HOME CARE INSTRUCTIONS   Take all medicines exactly as directed.  Follow any prescribed diet.  Follow instructions regarding both rest and physical activity.  Drink more fluids for the first several days after the procedure in order to help flush dye from your kidneys. SEEK MEDICAL CARE IF:  You develop a rash.  You have fever not controlled by medicine. SEEK IMMEDIATE MEDICAL CARE IF:  There is pain, drainage, bleeding, redness, swelling, warmth or a red streak at the site of the IV tube.  The extremity where your IV tube was placed becomes discolored, numb, or cool.  You have difficulty breathing or shortness of breath.  You develop chest pain.  You have excessive dizziness or fainting. Document Released: 09/04/2013 Document Revised: 11/19/2013 Document Reviewed: 09/04/2013 [facility] Patient Information 2015 [place], [place]. This information is not intended to replace advice given to you by your health care provider. Make sure you discuss any questions you have with your health care provider. General Anesthesia, Care After Refer to this sheet in the next few weeks. These instructions provide you with information on caring for yourself after your procedure. Your health care provider may also give you more specific instructions. Your treatment has been planned according to current medical practices, but problems sometimes occur. Call your health care provider if you have any problems or questions after your procedure. WHAT TO EXPECT AFTER THE PROCEDURE After the procedure, it is typical to experience:  Sleepiness.  Nausea and vomiting. HOME CARE INSTRUCTIONS  For the first 24 hours after general anesthesia:  Have a responsible person with you.  Do not drive a car. If you are alone, do not take public transportation.  Do not drink  alcohol.  Do not take medicine that has not been prescribed by your health care provider.  Do not  sign important papers or make important decisions.  You may resume a normal diet and activities as directed by your health care provider.  Change bandages (dressings) as directed.  If you have questions or problems that seem related to general anesthesia, call the hospital and ask for the anesthetist or anesthesiologist on call. SEEK MEDICAL CARE IF:  You have nausea and vomiting that continue the day after anesthesia.  You develop a rash. SEEK IMMEDIATE MEDICAL CARE IF:   You have difficulty breathing.  You have chest pain.  You have any allergic problems. Document Released: 02/20/2001 Document Revised: 11/19/2013 Document Reviewed: 05/30/2013 [facility] Patient Information 2015 [place], [place]. This information is not intended to replace advice given to you by your health care provider. Make sure you discuss any questions you have with your health care provider.

## 2015-06-08 NOTE — Transfer of Care (Signed)
Immediate Anesthesia Transfer of Care Note  [name]  Procedure(s) Performed: Procedure(s) with comments: Ascending Venogram  Iliac Vein  (Left) - ;  [facility] ZACH-[facility] HAVE BEEN NOTIFIED/ CONFIRMED INTRAVASCULAR ULTRASOUND (IVUS) (Left)  Patient Location: PACU  Anesthesia Type:General  Level of Consciousness: awake, alert  and oriented  Airway & Oxygen Therapy: Patient Spontanous Breathing and Patient connected to nasal cannula oxygen  Post-op Assessment: Report given to RN, Post -op Vital signs reviewed and stable and Patient moving all extremities X 4  Post vital signs: Reviewed and stable  Last Vitals:  Filed Vitals:   06/08/15 0607  BP:   Pulse:   Temp: 36.6 C  Resp:     Complications: No apparent anesthesia complications

## 2015-06-08 NOTE — H&P (Signed)
HISTORY AND PHYSICAL     CC:  Leg swelling left > right   Referring Provider:  Leanna Battles, MD  HPI: This is a 59 y.o. female who presents today for bilateral leg swelling with the left greater than right.  She states this has been going on for about a year, however, this has worsened over the past 5-6 months.    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  She states that she has tried elevation, but this does not relieve any of the swelling.  She had seen a cardiologist at [facility] who placed her on Lasix and an ARB.  She states that the Lasix did not help her swelling (it did help her BP) and she feels like the swelling worsened so she discontinued the Lasix and ARB.  She does take her Metoprolol and Clonidine for her HTN.    She states that she has tried compression stockings, however, this broke her into a rash on her legs (left > right).  She also had weeping with this cellulitis.   She went to the ER and was prescribed Keflex and the cellulitis cleared.   She states that she is having swelling around her eyes and on both arms as well.  She states she does not have a known allergy to latex.   She also had ultrasound that revealed no DVT.  She has no known hx of DVT.  She has also tried other types of socks with no relief.  She has family hx of varicose veins with her father.    Past Medical History   Diagnosis  Date   .  Hypertension     .  High cholesterol         Past Surgical History   Procedure  Laterality  Date   .  Partial hysterectomy       .  Back surgery       .  Wrist surgery       .  Cataract extraction, bilateral       .  Shoulder surgery           Allergies   Allergen  Reactions   .  Dilaudid [Hydromorphone Hcl]     .  Penicillins         Current Outpatient Prescriptions   Medication  Sig  Dispense  Refill   .  cloNIDine (CATAPRES) 0.1 MG tablet  Take 0.1 mg by mouth 2 (two) times daily. If BP is over 160, take one tab and two more  toprol       .  metoprolol succinate (TOPROL-XL) 50 MG 24 hr tablet  Take 50 mg by mouth 4 (four) times daily. Take with or immediately following a meal.          No current facility-administered medications for this visit.       Family History   Problem  Relation  Age of Onset   .  Cancer  Mother     .  Diabetes  Mother     .  Heart disease  Mother     .  Hyperlipidemia  Mother     .  Hypertension  Mother     .  Cancer  Father     .  Diabetes  Father     .  Heart disease  Father     .  Hyperlipidemia  Father     .  Hypertension  Father     .  Varicose Veins  Father     .  Cancer  Sister     .  Heart disease  Sister     .  Hyperlipidemia  Sister     .  Cancer  Brother     .  Diabetes  Brother     .  Hyperlipidemia  Brother          History      Social History   .  Marital Status:  Married       Spouse Name:  marty   .  Number of Children:  2   .  Years of Education:  college      Occupational History   .  [employer]        Social History Main Topics   .  Smoking status:  Never Smoker    .  Smokeless tobacco:  Not on file   .  Alcohol Use:  Yes   .  Drug Use:  No   .  Sexual Activity:  Not on file      Other Topics  Concern   .  Not on file      Social History Narrative      ROS: [x]  Positive   [ ]  Negative   [ ]  All sytems reviewed and are negative  Cardiovascular: [x]  chest pressure []  palpitations [x]  SOB lying flat [x]  DOE [x]  pain in legs while walking [x]  pain in feet when lying flat []  hx of DVT [x]  hx of phlebitis [x]  swelling in legs-left > right   [x]  varicose veins  Pulmonary: []  productive cough []  asthma []  wheezing [x]  recent OSA dx-does not use CPAP-expensive  Neurologic: [x]  weakness in []  arms [x]  legs [x]  numbness in []  arms [x]  legs [] difficulty speaking or slurred speech []  temporary loss of vision in one eye []  dizziness  Hematologic: []  bleeding problems []  problems with blood clotting easily  GI []  vomiting  blood []  blood in stool  GU: []  burning with urination []  blood in urine  Psychiatric: []  hx of major depression  Integumentary: [x]  rashes-recent cellulitis after wearing compression; rash on arms [x]  ulcers  Constitutional: []  fever [x]  chills   PHYSICAL EXAMINATION:    Filed Vitals:   06/08/15 0601 06/08/15 0607  BP: 162/94   Pulse: 80   Temp:  97.8 F (36.6 C)  Resp: 20   SpO2: 99%     General:  WDWN in NAD Gait: Normal HENT: WNL, normocephalic Pulmonary: normal non-labored breathing , without Rales, rhonchi,  wheezing Cardiac: RRR, without  Murmurs, rubs or gallops; without carotid bruits Abdomen: soft, NT, no masses Skin: with rashes BLE with left > right and bilateral arms right > left;, without ulcers  Vascular Exam/Pulses:   Right  Left   Radial  2+ (normal)  2+ (normal)   Ulnar  2+ (normal)  2+ (normal)   Popliteal  Unable to palpate    Unable to palpate     DP  2+ (normal)  2+ (normal)   PT  Unable to palpate    Unable to palpate      Extremities: without ischemic changes, without Gangrene , without cellulitis; without open wounds;  Musculoskeletal: no muscle wasting or atrophy       Neurologic: A&O X 3; Appropriate Affect ; SENSATION: normal; MOTOR FUNCTION:  moving all extremities equally. Speech is fluent/normal   Non-Invasive Vascular Imaging:   Lower Extremity Venous Duplex 05/07/15: 1.  No evidence of  DVT in the bilateral femoral-popliteal venous systems. 2.  No evidence of superficial vein thrombosis noted in the bilateral lower extremities 3.  Venous incompetence is noted in the bilateral common femoral veins and left popliteal veins.  Pt meds includes: Statin:  No. Beta Blocker:  Yes.   Aspirin: No. ACEI:  No. ARB:  No. Other Antiplatelet/Anticoagulant:  No.    ASSESSMENT/PLAN:: 59 y.o. female with bilateral leg swelling with the left greater than right   -pt has noted worsening of swelling over the past 5-6 months with no  relief from elevation or diuretics. -she is scheduled for an ascending venogram of the left iliac vein with possible angioplasty and possible stenting in the near future.  She understands that if she does have a stent placed, that she will have to be on lifelong anticoagulants and wishes to proceed. -she is encouraged to continue elevation  History and exam details as above. Patient does have evidence of deep vein reflux bilaterally. However her left leg is more swollen than her right. I believe she warrants a venogram to make sure she does not have an iliac vein stenosis on the left side. She possibly would need stenting of this. We will schedule this for early July. Risks benefits possible complications and procedure details were expanding the patient today including but not limited to bleeding infection contrast reaction need for chronic anticoagulation. She understands and agrees to proceed.  Ruta Hinds, MD [facility] Office: [phone] Pager: [phone]

## 2015-06-08 NOTE — Progress Notes (Signed)
Client up and walked and tol well and left groin stable; no bleeding or hematoma

## 2015-06-08 NOTE — Anesthesia Procedure Notes (Signed)
Procedure Name: Intubation Date/Time: 06/08/2015 7:43 AM Performed by: Ollen Bowl Pre-anesthesia Checklist: Patient identified, Emergency Drugs available, Suction available, Patient being monitored and Timeout performed Patient Re-evaluated:Patient Re-evaluated prior to inductionOxygen Delivery Method: Circle system utilized and Simple face mask Preoxygenation: Pre-oxygenation with 100% oxygen Intubation Type: IV induction Ventilation: Mask ventilation without difficulty Laryngoscope Size: Mac and 3 Grade View: Grade I Tube type: Oral Tube size: 7.5 mm Number of attempts: 1 Airway Equipment and Method: Patient positioned with wedge pillow and Stylet Placement Confirmation: ETT inserted through vocal cords under direct vision,  positive ETCO2 and breath sounds checked- equal and bilateral Secured at: 21 cm Tube secured with: Tape Dental Injury: Teeth and Oropharynx as per pre-operative assessment

## 2015-06-08 NOTE — Op Note (Signed)
Procedure: Ascending venogram with intravascular ultrasound  Preoperative diagnosis: Left leg swelling  Postoperative diagnosis: Same  Anesthesia: Gen.  Operative findings: Patent left iliac venous system with mild narrowing of external iliac vein (10 mm diameter)  Operative details: After obtaining informed consent, the patient was taken to the operating room. The patient was placed in supine position on the operating table. After induction generalized season and endotracheal intubation, the patient's entire left groin was prepped and draped in usual sterile fashion. Ultrasound was used to done for the left common femoral vein. Introducer needle was used to cannulate the left common femoral vein without difficulty. An 035 versacore wire was threaded up into the abdominal portion of the inferior vena cava. Next a 5 French sheath was placed over the guidewire into the left common femoral vein. Contrast angiogram was then performed of the left femoral iliac and inferior vena cava system. This showed no obvious narrowing. There was a large collateral crossing the pelvis but no obvious diameter change of the major venous branches. At this point the 5 French sheath was exchanged over a guidewire for a 10 [place] sheath. The ball cane 05 this catheter was then inserted through the 10 French sheath. Serial images were then obtained from the inferior vena cava all the way to the level of the left common femoral vein. There was one area at the iliac bifurcation which may have shown slight narrowing of the external iliac vein. However the vessel at this level was still 10 mm in diameter. The patient symptoms have overall improved and I did not feel that an intervention was necessary for a vessel of this caliber. At this point the iris catheter guidewire and sheath were removed. Hemostasis was obtained with direct pressure.  Operative management: The patient will continue to wear her compression stockings if she has  intermittent episodes of leg swelling. She does not have evidence of May Thurner syndrome currently. Her iliac venous system and common femoral vein all appear to be patent without significant narrowing. The patient will follow-up with me on an as-needed basis.  Ruta Hinds, MD [facility] Office: [phone] Pager: [phone]

## 2015-06-09 ENCOUNTER — Encounter (HOSPITAL_COMMUNITY): Payer: Self-pay | Admitting: Vascular Surgery

## 2015-06-10 NOTE — Anesthesia Postprocedure Evaluation (Signed)
Anesthesia Post Note  [name]  Procedure(s) Performed: Procedure(s) (LRB): Ascending Venogram  Iliac Vein  (Left) INTRAVASCULAR ULTRASOUND (IVUS) (Left)  Anesthesia type: General  Patient location: PACU  Post pain: Pain level controlled  Post assessment: Post-op Vital signs reviewed  Last Vitals: BP 144/75 mmHg  Pulse 51  Temp(Src) 36.1 C  Resp 14  SpO2 96%  Post vital signs: Reviewed  Level of consciousness: sedated  Complications: No apparent anesthesia complications

## 2015-12-14 ENCOUNTER — Other Ambulatory Visit: Payer: Self-pay | Admitting: Physical Medicine and Rehabilitation

## 2015-12-14 DIAGNOSIS — M545 Low back pain: Secondary | ICD-10-CM

## 2015-12-18 ENCOUNTER — Ambulatory Visit
Admission: RE | Admit: 2015-12-18 | Discharge: 2015-12-18 | Disposition: A | Discharge status: Home / Self Care | Payer: 59 | Source: Ambulatory Visit | Attending: Physical Medicine and Rehabilitation | Admitting: Physical Medicine and Rehabilitation

## 2015-12-18 DIAGNOSIS — M545 Low back pain: Secondary | ICD-10-CM

## 2016-01-14 DIAGNOSIS — G43909 Migraine, unspecified, not intractable, without status migrainosus: Secondary | ICD-10-CM | POA: Insufficient documentation

## 2016-01-14 DIAGNOSIS — G8929 Other chronic pain: Secondary | ICD-10-CM | POA: Insufficient documentation

## 2016-01-14 DIAGNOSIS — M549 Dorsalgia, unspecified: Secondary | ICD-10-CM

## 2016-07-29 HISTORY — PX: CHOLECYSTECTOMY: SHX55

## 2016-08-27 ENCOUNTER — Other Ambulatory Visit (INDEPENDENT_AMBULATORY_CARE_PROVIDER_SITE_OTHER): Payer: Self-pay | Admitting: Specialist

## 2016-08-27 DIAGNOSIS — M546 Pain in thoracic spine: Secondary | ICD-10-CM

## 2016-09-01 ENCOUNTER — Ambulatory Visit (INDEPENDENT_AMBULATORY_CARE_PROVIDER_SITE_OTHER): Payer: Self-pay | Admitting: Orthopaedic Surgery

## 2016-09-07 ENCOUNTER — Ambulatory Visit (INDEPENDENT_AMBULATORY_CARE_PROVIDER_SITE_OTHER): Payer: Self-pay | Admitting: Orthopaedic Surgery

## 2016-09-11 ENCOUNTER — Ambulatory Visit
Admission: RE | Admit: 2016-09-11 | Discharge: 2016-09-11 | Disposition: A | Discharge status: Home / Self Care | Payer: 59 | Source: Ambulatory Visit | Attending: Specialist | Admitting: Specialist

## 2016-09-11 DIAGNOSIS — M546 Pain in thoracic spine: Secondary | ICD-10-CM

## 2016-09-23 ENCOUNTER — Encounter (INDEPENDENT_AMBULATORY_CARE_PROVIDER_SITE_OTHER): Payer: Self-pay | Admitting: Specialist

## 2016-09-23 ENCOUNTER — Ambulatory Visit (INDEPENDENT_AMBULATORY_CARE_PROVIDER_SITE_OTHER): Payer: 59 | Admitting: Specialist

## 2016-09-23 VITALS — BP 156/93 | HR 71 | Ht 67.0 in | Wt 170.0 lb

## 2016-09-23 DIAGNOSIS — M7061 Trochanteric bursitis, right hip: Secondary | ICD-10-CM | POA: Diagnosis not present

## 2016-09-23 DIAGNOSIS — M47894 Other spondylosis, thoracic region: Secondary | ICD-10-CM

## 2016-09-23 DIAGNOSIS — M1612 Unilateral primary osteoarthritis, left hip: Secondary | ICD-10-CM | POA: Diagnosis not present

## 2016-09-23 MED ORDER — BUPIVACAINE HCL 0.25 % IJ SOLN
4.0000 mL | INTRAMUSCULAR | Status: AC | PRN
Start: 2016-09-23 — End: 2016-09-23
  Administered 2016-09-23: 4 mL via INTRA_ARTICULAR

## 2016-09-23 MED ORDER — METHYLPREDNISOLONE ACETATE 40 MG/ML IJ SUSP
40.0000 mg | INTRAMUSCULAR | Status: AC | PRN
  Administered 2016-09-23: 40 mg via INTRA_ARTICULAR

## 2016-09-23 NOTE — Patient Instructions (Signed)
Decrease stooping, over lifting and overhead use of arms. No lifting over 25 lbs. Take the diclofenac with meal or snack. Start exercises given in manuals for right greater trochanter and for the thoracic spine. Consider TENs unit. Ice to the area of the right hip where injected today 15 min 2-3 times a day for 2 days Then may use heat. Heat in the AM and ice at night. Check at [employer] and [employer] for [facility] to help with back support use only half a day.

## 2016-09-23 NOTE — Progress Notes (Signed)
Office Visit Note   [name]           Date of Birth: [birth year]           MRN: [id number] Visit Date: 09/23/2016              Requested by: Leanna Battles, MD [address] [place], [place] [address] PCP: Donnajean Lopes, MD   Assessment & Plan: Visit Diagnoses:  1. Greater trochanteric bursitis of right hip   2. Unilateral primary osteoarthritis, left hip   3. Other spondylosis, thoracic region     Plan: Decrease stooping, over lifting and overhead use of arms. No lifting over 25 lbs. Take the diclofenac with meal or snack. Start exercises given in manuals for right greater trochanter and for the thoracic spine. Consider TENs unit. Ice to the area of the right hip where injected today 15 min 2-3 times a day for 2 days Then may use heat. Heat in the AM and ice at night. Check at [employer] for [place] to help with back support use only half a day  Follow-Up Instructions: Return in about 6 weeks (around 11/04/2016) for eval of injection and oral NSAIDs use..   Orders:  Orders Placed This Encounter  Procedures  . Large Joint Injection/Arthrocentesis   No orders of the defined types were placed in this encounter.     Procedures: Large Joint Inj Date/Time: 09/23/2016 11:20 AM Performed by: Jessy Oto Authorized by: Jessy Oto   Consent Given by:  Patient Indications:  Pain Location:  Hip Site:  R greater trochanter Prep: patient was prepped and draped in usual sterile fashion   Needle Size:  22 G Needle Length:  3.5 inches Approach:  Superolateral Ultrasound Guidance: No   Fluoroscopic Guidance: No   Arthrogram: No Medications:  4 mL bupivacaine 0.25 %; 40 mg methylPREDNISolone acetate 40 MG/ML Aspirate:  Yellow Patient tolerance:  Patient tolerated the procedure well with no immediate complications Comments: Ethylene chloride cold spray used for initial local anesthetic affect. Band aid applied at the end of the  case.       Clinical Data: Findings:  Thoracic MRI shows left shallow paramedian HNP T2-3 non compressive, Moderate left and mild right foramenal stenosis at T9-10 due to spondylosis, and facet spur. Dextroscoliosis apex at the T9 level.    Subjective: Chief Complaint  Patient presents with  . Middle Back - Pain  . Right Hip - Pain  . Left Hip - Pain    Patient returns today to follow up on MRI Tspine. Still having some pain, but states the heel lift that she was given at last office visit has helped a lot. 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 Trying to avoid stooping and bending.Pain localizes to the mid and lower dorsal and thoracolumbar spine. Lifting flower and dirt, potting soil a few months ago. Pain post lifting soil and flowers and placing in truck. Pain has continous since then, not been to PT due to cost. Bowel okay. Increased frequency. Using a shoe insert for leg length discrepancy, left side. 5/8th difference in leg length left longer than right. Implants in eyes so unable to due extensive MRI with larger magnet. Sleep with tassing and turning, not able to sleep on the right side.     Review of Systems  Constitutional: Positive for unexpected weight change.  HENT: Positive for congestion, ear pain, rhinorrhea, sinus pressure  and sneezing.   Eyes: Positive for redness and itching. Negative for photophobia, pain, discharge and visual disturbance.  Respiratory: Positive for cough. Negative for apnea, choking, chest tightness, shortness of breath, wheezing and stridor.   Cardiovascular: Negative for chest pain, palpitations and leg swelling.  Gastrointestinal: Negative for abdominal distention, abdominal pain, anal bleeding, constipation, diarrhea and vomiting.  Endocrine: Positive for cold intolerance.  Genitourinary: Positive for frequency and urgency. Negative for difficulty urinating and hematuria.  Musculoskeletal:  Positive for back pain, gait problem, joint swelling and myalgias.  Skin: Negative.   Allergic/Immunologic: Positive for environmental allergies. Negative for food allergies and immunocompromised state.  Neurological: Positive for weakness, numbness and headaches. Negative for dizziness, seizures, speech difficulty and light-headedness.  Hematological: Negative.   Psychiatric/Behavioral: Negative for agitation, behavioral problems, confusion, dysphoric mood, hallucinations, self-injury, sleep disturbance and suicidal ideas. The patient is hyperactive. The patient is not nervous/anxious.      Objective: Vital Signs: BP (!) 156/93   Pulse 71   Ht 5\' 7"  (1.702 m)   Wt 170 lb (77.1 kg)   BMI 26.63 kg/m   Physical Exam  Constitutional: She appears well-developed and well-nourished.  HENT:  Head: Normocephalic and atraumatic.  Eyes: EOM are normal. Pupils are equal, round, and reactive to light.  Neck: Normal range of motion. Neck supple.  Pulmonary/Chest: Effort normal and breath sounds normal.  Abdominal: Soft.  Musculoskeletal: She exhibits tenderness.  Skin: Skin is warm and dry.  Psychiatric: She has a normal mood and affect. Her behavior is normal. Judgment and thought content normal.    Right Hip Exam   Tenderness  The patient is experiencing tenderness in the greater trochanter.  Range of Motion  Extension: normal  Flexion: normal  Internal Rotation: normal  External Rotation: normal  Abduction: normal  Adduction: normal   Muscle Strength  Abduction: 5/5  Adduction: 5/5  Flexion: 5/5   Tests  FABER: negative Ober: positive  Other  Erythema: absent Scars: absent Sensation: normal Pulse: present  Comments:  Tendre right greater trochanter, and with abduction and flex/ext  Over the upper greater trochanter.   Left Hip Exam   Tenderness  The patient is experiencing tenderness in the lateral.  Range of Motion  Extension: abnormal  Flexion: abnormal    Internal Rotation: abnormal  External Rotation: abnormal  Abduction: abnormal  Adduction: normal   Muscle Strength  Abduction: 5/5  Adduction: 5/5  Flexion: 5/5   Tests  FABER: positive Ober: positive  Other  Erythema: absent Scars: absent Sensation: normal Pulse: present  Comments:  Painful ROM left hip   Back Exam   Tenderness  The patient is experiencing tenderness in the lumbar.  Range of Motion  Extension: normal  Flexion: normal  Lateral Bend Right: normal  Lateral Bend Left: normal  Rotation Right: normal  Rotation Left: normal   Muscle Strength  Right Quadriceps:  5/5  Left Quadriceps:  5/5  Right Hamstrings:  5/5  Left Hamstrings:  5/5   Tests  Straight leg raise right: negative Straight leg raise left: negative  Reflexes  Patellar: 0/4 Achilles: 0/4  Other  Toe Walk: normal Heel Walk: normal Sensation: normal Gait: Trendelenburg  Scars: absent      Specialty Comments:  No specialty comments available.  Imaging: No results found.   PMFS History: Patient Active Problem List   Diagnosis Date Noted  . Unilateral primary osteoarthritis, left hip 09/23/2016    Priority: High    Class: Chronic  . Chronic  back pain 01/14/2016  . Migraine 01/14/2016  . Essential hypertension 01/22/2015   Past Medical History:  Diagnosis Date  . Arthritis   . Cancer ([place])    skin cancer  . GERD (gastroesophageal reflux disease)   . Headache   . High cholesterol   . History of bronchitis   . History of kidney stones   . Hypertension   . Shortness of breath dyspnea   . Sleep apnea    pt. does not use a CPAP or BiPAP  . Stroke ([place])    Pt. states possible mini stroke in 2014    Family History  Problem Relation Age of Onset  . Cancer Mother   . Diabetes Mother   . Heart disease Mother   . Hyperlipidemia Mother   . Hypertension Mother   . Cancer Father   . Diabetes Father   . Heart disease Father   . Hyperlipidemia Father   .  Hypertension Father   . Varicose Veins Father   . Cancer Sister   . Heart disease Sister   . Hyperlipidemia Sister   . Cancer Brother   . Diabetes Brother   . Hyperlipidemia Brother     Past Surgical History:  Procedure Laterality Date  . BACK SURGERY    . CARDIAC CATHETERIZATION     01/27/15 [facility]): EF 60%, normal coronaries.  Marland Kitchen CATARACT EXTRACTION, BILATERAL    . EYE SURGERY    . LOWER EXTREMITY ANGIOGRAM Left 06/08/2015   Procedure: Ascending Venogram  Iliac Vein ;  Surgeon: Elam Dutch, MD;  Location: [facility];  Service: Vascular;  Laterality: Left;  ;  TARA- [facility] SCIENTIFIC / ZACH-VOLCANO HAVE BEEN NOTIFIED/ CONFIRMED  . PARTIAL HYSTERECTOMY    . SHOULDER SURGERY    . WRIST SURGERY Bilateral    carpal tunnel   Social History   Occupational History  . [employer]   Social History Main Topics  . Smoking status: Never Smoker  . Smokeless tobacco: Not on file  . Alcohol use No  . Drug use: No  . Sexual activity: Not on file

## 2016-10-03 ENCOUNTER — Ambulatory Visit (INDEPENDENT_AMBULATORY_CARE_PROVIDER_SITE_OTHER): Payer: 59 | Admitting: Orthopaedic Surgery

## 2016-10-03 ENCOUNTER — Ambulatory Visit (INDEPENDENT_AMBULATORY_CARE_PROVIDER_SITE_OTHER): Payer: Self-pay

## 2016-10-03 VITALS — Resp 20 | Ht 67.0 in | Wt 175.0 lb

## 2016-10-03 DIAGNOSIS — M1612 Unilateral primary osteoarthritis, left hip: Secondary | ICD-10-CM | POA: Diagnosis not present

## 2016-10-03 DIAGNOSIS — M25551 Pain in right hip: Secondary | ICD-10-CM

## 2016-10-03 DIAGNOSIS — M25552 Pain in left hip: Secondary | ICD-10-CM | POA: Diagnosis not present

## 2016-10-03 NOTE — Progress Notes (Signed)
Office Visit Note   [name]           Date of Birth: 09-02-1956           MRN: [id number] Visit Date: 10/03/2016              Requested by: Leanna Battles, MD [address] [place], [place] [address] PCP: Donnajean Lopes, MD   Assessment & Plan: Visit Diagnoses:  1. Pain in right hip   2. Unilateral primary osteoarthritis, left hip   3. Pain of left hip joint     Plan: Given her x-ray findings of her left hip combined with her severe daily pain which is 10 out of 10, her decreased and quality of life, and her to decrease her activity is daily living as well as her mobility she was to receive the total hip replacement arthroplasty on the left hip. I spent this and the amount of time shoulder hip model as well as going through hip x-rays and describing what the surgery involves as well as a thorough discussion of the risk and benefits of the surgery. She does wish to proceed in the near future. She has tried and failed all forms conservative treatment. We would then see her back in 2 weeks postoperative. All questions were encouraged and answered. No x-rays will be needed on her follow-up 1.  Follow-Up Instructions: Return for 2 weeks post-op.   Orders:  Orders Placed This Encounter  Procedures  . XR Pelvis 1-2 Views   No orders of the defined types were placed in this encounter.     Procedures: No procedures performed   Clinical Data: No additional findings.   Subjective: Chief Complaint  Patient presents with  . Left Hip - Pain    Referral from Dr. Selmer Dominion for right hip replacement.   Mr. Pharris is a very pleasant 60 year old female sent to me from Dr. Louanne Skye due to debilitating arthritis involving her left hip. Her pain is daily and is 10 out of 10. His stature mainly affecting her activities of daily living her mobility and her quality of life.  HPI  Review of Systems Negative for chest pain, shortness of breath, fever, chills, nausea,  vomiting  Objective: Vital Signs: Resp 20   Ht 5\' 7"  (1.702 m)   Wt 175 lb (79.4 kg)   BMI 27.41 kg/m   Physical Exam  Constitutional: She is oriented to person, place, and time. She appears well-developed and well-nourished.  HENT:  Head: Normocephalic and atraumatic.  Eyes: EOM are normal. Pupils are equal, round, and reactive to light.  Neck: Normal range of motion. Neck supple.  Cardiovascular: Normal rate and regular rhythm.   Pulmonary/Chest: Effort normal.  Abdominal: Soft. Bowel sounds are normal.  Musculoskeletal:       Left hip: She exhibits decreased range of motion, decreased strength, tenderness and bony tenderness.  Neurological: She is alert and oriented to person, place, and time.  Skin: Skin is warm and dry.    Ortho Exam  Specialty Comments:  No specialty comments available.  Imaging: Xr Pelvis 1-2 Views  Result Date: 10/03/2016 There were actually no new x-rays today. These x-rays are from September that I reviewed. She has primary osteoarthritis and degenerative joint disease of her left hip. They're sclerotic changes on both the femoral head and acetabulum. She also has significant joint space narrowing as well.    PMFS History: Patient Active Problem List   Diagnosis Date Noted  . Unilateral primary  osteoarthritis, left hip 09/23/2016    Class: Chronic  . Chronic back pain 01/14/2016  . Migraine 01/14/2016  . Essential hypertension 01/22/2015   Past Medical History:  Diagnosis Date  . Arthritis   . Cancer ([place])    skin cancer  . GERD (gastroesophageal reflux disease)   . Headache   . High cholesterol   . History of bronchitis   . History of kidney stones   . Hypertension   . Shortness of breath dyspnea   . Sleep apnea    pt. does not use a CPAP or BiPAP  . Stroke ([place])    Pt. states possible mini stroke in 2014    Family History  Problem Relation Age of Onset  . Cancer Mother   . Diabetes Mother   . Heart disease Mother   .  Hyperlipidemia Mother   . Hypertension Mother   . Cancer Father   . Diabetes Father   . Heart disease Father   . Hyperlipidemia Father   . Hypertension Father   . Varicose Veins Father   . Cancer Sister   . Heart disease Sister   . Hyperlipidemia Sister   . Cancer Brother   . Diabetes Brother   . Hyperlipidemia Brother     Past Surgical History:  Procedure Laterality Date  . BACK SURGERY    . CARDIAC CATHETERIZATION     01/27/15 [facility]): EF 60%, normal coronaries.  Marland Kitchen CATARACT EXTRACTION, BILATERAL    . EYE SURGERY    . LOWER EXTREMITY ANGIOGRAM Left 06/08/2015   Procedure: Ascending Venogram  Iliac Vein ;  Surgeon: Elam Dutch, MD;  Location: [facility] OR;  Service: Vascular;  Laterality: Left;  ;  TARA- BOSTON SCIENTIFIC / ZACH-VOLCANO HAVE BEEN NOTIFIED/ CONFIRMED  . PARTIAL HYSTERECTOMY    . SHOULDER SURGERY    . WRIST SURGERY Bilateral    carpal tunnel   Social History   Occupational History  . [employer]   Social History Main Topics  . Smoking status: Never Smoker  . Smokeless tobacco: Not on file  . Alcohol use No  . Drug use: No  . Sexual activity: Not on file

## 2016-10-05 ENCOUNTER — Other Ambulatory Visit (INDEPENDENT_AMBULATORY_CARE_PROVIDER_SITE_OTHER): Payer: Self-pay | Admitting: Physician Assistant

## 2016-10-11 ENCOUNTER — Encounter (HOSPITAL_COMMUNITY): Payer: Self-pay

## 2016-10-14 ENCOUNTER — Encounter (HOSPITAL_COMMUNITY)
Admission: RE | Admit: 2016-10-14 | Discharge: 2016-10-14 | Disposition: A | Discharge status: Home / Self Care | Payer: 59 | Source: Ambulatory Visit | Attending: Orthopaedic Surgery | Admitting: Orthopaedic Surgery

## 2016-10-14 ENCOUNTER — Encounter (HOSPITAL_COMMUNITY): Payer: Self-pay

## 2016-10-14 DIAGNOSIS — I10 Essential (primary) hypertension: Secondary | ICD-10-CM | POA: Insufficient documentation

## 2016-10-14 DIAGNOSIS — Z01810 Encounter for preprocedural cardiovascular examination: Secondary | ICD-10-CM | POA: Insufficient documentation

## 2016-10-14 HISTORY — DX: Other specified postprocedural states: Z98.890

## 2016-10-14 HISTORY — DX: Other specified postprocedural states: R11.2

## 2016-10-14 HISTORY — DX: Other complications of anesthesia, initial encounter: T88.59XA

## 2016-10-14 HISTORY — DX: Nausea with vomiting, unspecified: R11.2

## 2016-10-14 HISTORY — DX: Adverse effect of unspecified anesthetic, initial encounter: T41.45XA

## 2016-10-14 LAB — SURGICAL PCR SCREEN
MRSA, PCR: NEGATIVE
Staphylococcus aureus: NEGATIVE

## 2016-10-14 LAB — ABO/RH: ABO/RH(D): O POS

## 2016-10-14 NOTE — Pre-Procedure Instructions (Signed)
CMP, CBC - 09/29/16 - on chart

## 2016-10-14 NOTE — Patient Instructions (Addendum)
Natalie Bond  10/14/2016   Your procedure is scheduled on: 10/21/16  Report to [facility] Main  Entrance take [facility]  elevators to 3rd floor to  [place] at 11:15 AM.  Call this number if you have problems the morning of surgery [phone]   Remember: ONLY 1 PERSON MAY GO WITH YOU TO SHORT STAY TO GET  READY MORNING OF [place].  Do not eat food or drink liquids :After Midnight. You may have clear liquids until 7:15 AM morning of surgery.  Then nothing to eat or drink.     Take these medicines the morning of surgery with A SIP OF WATER: Clonidine if needed, hydrocodone-acetaminophen if needed, eye drops if needed.                                You may not have any metal on your body including hair pins and              piercings  Do not wear jewelry, make-up, lotions, powders or perfumes, deodorant             Do not wear nail polish.  Do not shave  48 hours prior to surgery.              Men may shave face and neck.   Do not bring valuables to the hospital. [place].  Contacts, dentures or bridgework may not be worn into surgery.  Leave suitcase in the car. After surgery it may be brought to your room.              Please read over the following fact sheets you were given: _____________________________________________________________________             [facility] - Preparing for Surgery Before surgery, you can play an important role.  Because skin is not sterile, your skin needs to be as free of germs as possible.  You can reduce the number of germs on your skin by washing with CHG (chlorahexidine gluconate) soap before surgery.  CHG is an antiseptic cleaner which kills germs and bonds with the skin to continue killing germs even after washing. Please DO NOT use if you have an allergy to CHG or antibacterial soaps.  If your skin becomes reddened/irritated stop using the CHG and inform your  nurse when you arrive at Short Stay. Do not shave (including legs and underarms) for at least 48 hours prior to the first CHG shower.  You may shave your face/neck. Please follow these instructions carefully:  1.  Shower with CHG Soap the night before surgery and the  morning of Surgery.  2.  If you choose to wash your hair, wash your hair first as usual with your  normal  shampoo.  3.  After you shampoo, rinse your hair and body thoroughly to remove the  shampoo.                           4.  Use CHG as you would any other liquid soap.  You can apply chg directly  to the skin and wash  Gently with a scrungie or clean washcloth.  5.  Apply the CHG Soap to your body ONLY FROM THE NECK DOWN.   Do not use on face/ open                           Wound or open sores. Avoid contact with eyes, ears mouth and genitals (private parts).                       Wash face,  Genitals (private parts) with your normal soap.             6.  Wash thoroughly, paying special attention to the area where your surgery  will be performed.  7.  Thoroughly rinse your body with warm water from the neck down.  8.  DO NOT shower/wash with your normal soap after using and rinsing off  the CHG Soap.                9.  Pat yourself dry with a clean towel.            10.  Wear clean pajamas.            11.  Place clean sheets on your bed the night of your first shower and do not  sleep with pets. Day of Surgery : Do not apply any lotions/deodorants the morning of surgery.  Please wear clean clothes to the hospital/surgery center.  FAILURE TO FOLLOW THESE INSTRUCTIONS MAY RESULT IN THE CANCELLATION OF YOUR SURGERY PATIENT SIGNATURE_________________________________  NURSE SIGNATURE__________________________________  ________________________________________________________________________   Adam Phenix  An incentive spirometer is a tool that can help keep your lungs clear and active. This tool  measures how well you are filling your lungs with each breath. Taking long deep breaths may help reverse or decrease the chance of developing breathing (pulmonary) problems (especially infection) following:  A long period of time when you are unable to move or be active. BEFORE THE PROCEDURE   If the spirometer includes an indicator to show your best effort, your nurse or respiratory therapist will set it to a desired goal.  If possible, sit up straight or lean slightly forward. Try not to slouch.  Hold the incentive spirometer in an upright position. INSTRUCTIONS FOR USE  1. Sit on the edge of your bed if possible, or sit up as far as you can in bed or on a chair. 2. Hold the incentive spirometer in an upright position. 3. Breathe out normally. 4. Place the mouthpiece in your mouth and seal your lips tightly around it. 5. Breathe in slowly and as deeply as possible, raising the piston or the ball toward the top of the column. 6. Hold your breath for 3-5 seconds or for as long as possible. Allow the piston or ball to fall to the bottom of the column. 7. Remove the mouthpiece from your mouth and breathe out normally. 8. Rest for a few seconds and repeat Steps 1 through 7 at least 10 times every 1-2 hours when you are awake. Take your time and take a few normal breaths between deep breaths. 9. The spirometer may include an indicator to show your best effort. Use the indicator as a goal to work toward during each repetition. 10. After each set of 10 deep breaths, practice coughing to be sure your lungs are clear. If you have an incision (the cut made at the time of surgery),  support your incision when coughing by placing a pillow or rolled up towels firmly against it. Once you are able to get out of bed, walk around indoors and cough well. You may stop using the incentive spirometer when instructed by your caregiver.  RISKS AND COMPLICATIONS  Take your time so you do not get dizzy or  light-headed.  If you are in pain, you may need to take or ask for pain medication before doing incentive spirometry. It is harder to take a deep breath if you are having pain. AFTER USE  Rest and breathe slowly and easily.  It can be helpful to keep track of a log of your progress. Your caregiver can provide you with a simple table to help with this. If you are using the spirometer at home, follow these instructions: [place] IF:   You are having difficultly using the spirometer.  You have trouble using the spirometer as often as instructed.  Your pain medication is not giving enough relief while using the spirometer.  You develop fever of 100.5 F (38.1 C) or higher. SEEK IMMEDIATE MEDICAL CARE IF:   You cough up bloody sputum that had not been present before.  You develop fever of 102 F (38.9 C) or greater.  You develop worsening pain at or near the incision site. MAKE SURE YOU:   Understand these instructions.  Will watch your condition.  Will get help right away if you are not doing well or get worse. Document Released: 03/27/2007 Document Revised: 02/06/2012 Document Reviewed: 05/28/2007 ExitCare Patient Information 2014 Exit[employer], [place].   ________________________________________________________________________  WHAT IS A BLOOD TRANSFUSION? Blood Transfusion Information  A transfusion is the replacement of blood or some of its parts. Blood is made up of multiple cells which provide different functions.  Red blood cells carry oxygen and are used for blood loss replacement.  White blood cells fight against infection.  Platelets control bleeding.  Plasma helps clot blood.  Other blood products are available for specialized needs, such as hemophilia or other clotting disorders. BEFORE THE TRANSFUSION  Who gives blood for transfusions?   Healthy volunteers who are fully evaluated to make sure their blood is safe. This is blood bank  blood. Transfusion therapy is the safest it has ever been in the practice of medicine. Before blood is taken from a donor, a complete history is taken to make sure that person has no history of diseases nor engages in risky social behavior (examples are intravenous drug use or sexual activity with multiple partners). The donor's travel history is screened to minimize risk of transmitting infections, such as malaria. The donated blood is tested for signs of infectious diseases, such as HIV and hepatitis. The blood is then tested to be sure it is compatible with you in order to minimize the chance of a transfusion reaction. If you or a relative donates blood, this is often done in anticipation of surgery and is not appropriate for emergency situations. It takes many days to process the donated blood. RISKS AND COMPLICATIONS Although transfusion therapy is very safe and saves many lives, the main dangers of transfusion include:   Getting an infectious disease.  Developing a transfusion reaction. This is an allergic reaction to something in the blood you were given. Every precaution is taken to prevent this. The decision to have a blood transfusion has been considered carefully by your caregiver before blood is given. Blood is not given unless the benefits outweigh the risks. AFTER THE TRANSFUSION  Right after receiving a blood transfusion, you will usually feel much better and more energetic. This is especially true if your red blood cells have gotten low (anemic). The transfusion raises the level of the red blood cells which carry oxygen, and this usually causes an energy increase.  The nurse administering the transfusion will monitor you carefully for complications. HOME CARE INSTRUCTIONS  No special instructions are needed after a transfusion. You may find your energy is better. Speak with your caregiver about any limitations on activity for underlying diseases you may have. SEEK MEDICAL CARE IF:    Your condition is not improving after your transfusion.  You develop redness or irritation at the intravenous (IV) site. SEEK IMMEDIATE MEDICAL CARE IF:  Any of the following symptoms occur over the next 12 hours:  Shaking chills.  You have a temperature by mouth above 102 F (38.9 C), not controlled by medicine.  Chest, back, or muscle pain.  People around you feel you are not acting correctly or are confused.  Shortness of breath or difficulty breathing.  Dizziness and fainting.  You get a rash or develop hives.  You have a decrease in urine output.  Your urine turns a dark color or changes to pink, red, or brown. Any of the following symptoms occur over the next 10 days:  You have a temperature by mouth above 102 F (38.9 C), not controlled by medicine.  Shortness of breath.  Weakness after normal activity.  The white part of the eye turns yellow (jaundice).  You have a decrease in the amount of urine or are urinating less often.  Your urine turns a dark color or changes to pink, red, or brown. Document Released: 11/11/2000 Document Revised: 02/06/2012 Document Reviewed: 06/30/2008 [facility] Patient Information 2014 [place], [place].  _______________________________________________________________________

## 2016-10-17 NOTE — Progress Notes (Signed)
Pt aware to arrive at [facility] short stay at 10:30 am on 09/20/2016.Pt verbalized understanding of no food or drink after midnight.

## 2016-10-19 ENCOUNTER — Telehealth (INDEPENDENT_AMBULATORY_CARE_PROVIDER_SITE_OTHER): Payer: Self-pay | Admitting: *Deleted

## 2016-10-19 NOTE — Telephone Encounter (Signed)
Pt called stating her husband left some papers in an envelope for Dr. Ninfa Linden to fill out before surgery. Pts surgery is Friday 11/24. [phone]

## 2016-10-21 ENCOUNTER — Inpatient Hospital Stay (HOSPITAL_COMMUNITY): Payer: 59

## 2016-10-21 ENCOUNTER — Inpatient Hospital Stay (HOSPITAL_COMMUNITY): Payer: 59 | Admitting: Anesthesiology

## 2016-10-21 ENCOUNTER — Encounter (HOSPITAL_COMMUNITY)
Admission: RE | Disposition: A | Discharge status: Home Health | Payer: Self-pay | Source: Ambulatory Visit | Attending: Orthopaedic Surgery

## 2016-10-21 ENCOUNTER — Inpatient Hospital Stay (HOSPITAL_COMMUNITY)
Admission: RE | Admit: 2016-10-21 | Discharge: 2016-10-23 | DRG: 470 | Disposition: A | Discharge status: Home Health | Payer: 59 | Source: Ambulatory Visit | Attending: Orthopaedic Surgery | Admitting: Orthopaedic Surgery

## 2016-10-21 ENCOUNTER — Encounter (HOSPITAL_COMMUNITY): Payer: Self-pay | Admitting: *Deleted

## 2016-10-21 DIAGNOSIS — G4730 Sleep apnea, unspecified: Secondary | ICD-10-CM | POA: Diagnosis present

## 2016-10-21 DIAGNOSIS — Z96642 Presence of left artificial hip joint: Secondary | ICD-10-CM

## 2016-10-21 DIAGNOSIS — E7800 Pure hypercholesterolemia, unspecified: Secondary | ICD-10-CM | POA: Diagnosis present

## 2016-10-21 DIAGNOSIS — Z833 Family history of diabetes mellitus: Secondary | ICD-10-CM | POA: Diagnosis not present

## 2016-10-21 DIAGNOSIS — Z8249 Family history of ischemic heart disease and other diseases of the circulatory system: Secondary | ICD-10-CM | POA: Diagnosis not present

## 2016-10-21 DIAGNOSIS — I10 Essential (primary) hypertension: Secondary | ICD-10-CM | POA: Diagnosis present

## 2016-10-21 DIAGNOSIS — Z87442 Personal history of urinary calculi: Secondary | ICD-10-CM | POA: Diagnosis not present

## 2016-10-21 DIAGNOSIS — M1612 Unilateral primary osteoarthritis, left hip: Secondary | ICD-10-CM

## 2016-10-21 DIAGNOSIS — Z809 Family history of malignant neoplasm, unspecified: Secondary | ICD-10-CM

## 2016-10-21 DIAGNOSIS — Z85828 Personal history of other malignant neoplasm of skin: Secondary | ICD-10-CM | POA: Diagnosis not present

## 2016-10-21 DIAGNOSIS — Z8673 Personal history of transient ischemic attack (TIA), and cerebral infarction without residual deficits: Secondary | ICD-10-CM | POA: Diagnosis not present

## 2016-10-21 DIAGNOSIS — M25552 Pain in left hip: Secondary | ICD-10-CM | POA: Diagnosis present

## 2016-10-21 DIAGNOSIS — K219 Gastro-esophageal reflux disease without esophagitis: Secondary | ICD-10-CM | POA: Diagnosis present

## 2016-10-21 DIAGNOSIS — E785 Hyperlipidemia, unspecified: Secondary | ICD-10-CM | POA: Diagnosis present

## 2016-10-21 DIAGNOSIS — M25559 Pain in unspecified hip: Secondary | ICD-10-CM

## 2016-10-21 HISTORY — PX: TOTAL HIP ARTHROPLASTY: SHX124

## 2016-10-21 LAB — TYPE AND SCREEN
ABO/RH(D): O POS
ANTIBODY SCREEN: NEGATIVE

## 2016-10-21 SURGERY — ARTHROPLASTY, HIP, TOTAL, ANTERIOR APPROACH
Anesthesia: General | Site: Hip | Laterality: Left

## 2016-10-21 MED ORDER — CHLORHEXIDINE GLUCONATE 4 % EX LIQD: 60.0000 mL | Freq: Once | CUTANEOUS | Status: DC

## 2016-10-21 MED ORDER — DEXAMETHASONE SODIUM PHOSPHATE 10 MG/ML IJ SOLN
INTRAMUSCULAR | Status: DC | PRN
  Administered 2016-10-21: 10 mg via INTRAVENOUS

## 2016-10-21 MED ORDER — ONDANSETRON HCL 4 MG/2ML IJ SOLN
INTRAMUSCULAR | Status: AC
  Filled 2016-10-21: qty 2

## 2016-10-21 MED ORDER — FENTANYL CITRATE (PF) 100 MCG/2ML IJ SOLN
INTRAMUSCULAR | Status: AC
  Filled 2016-10-21: qty 2

## 2016-10-21 MED ORDER — ASPIRIN 81 MG PO CHEW
81.0000 mg | CHEWABLE_TABLET | Freq: Two times a day (BID) | ORAL | Status: DC
  Administered 2016-10-21 – 2016-10-23 (×4): 81 mg via ORAL
  Filled 2016-10-21 (×4): qty 1

## 2016-10-21 MED ORDER — HYDROCHLOROTHIAZIDE 12.5 MG PO CAPS
12.5000 mg | ORAL_CAPSULE | Freq: Every day | ORAL | Status: DC
  Administered 2016-10-22 – 2016-10-23 (×2): 12.5 mg via ORAL
  Filled 2016-10-21 (×2): qty 1

## 2016-10-21 MED ORDER — KETAMINE HCL 10 MG/ML IJ SOLN
INTRAMUSCULAR | Status: DC | PRN
  Administered 2016-10-21: 40 mg via INTRAVENOUS
  Administered 2016-10-21: 10 mg via INTRAVENOUS

## 2016-10-21 MED ORDER — KETAMINE HCL 10 MG/ML IJ SOLN
INTRAMUSCULAR | Status: AC
  Filled 2016-10-21: qty 1

## 2016-10-21 MED ORDER — OXYCODONE HCL 5 MG PO TABS
5.0000 mg | ORAL_TABLET | ORAL | Status: DC | PRN
  Administered 2016-10-21: 10 mg via ORAL
  Administered 2016-10-21: 5 mg via ORAL
  Administered 2016-10-22 – 2016-10-23 (×7): 10 mg via ORAL
  Filled 2016-10-21 (×5): qty 2
  Filled 2016-10-21: qty 1
  Filled 2016-10-21 (×4): qty 2

## 2016-10-21 MED ORDER — PROPOFOL 10 MG/ML IV BOLUS
INTRAVENOUS | Status: DC | PRN
  Administered 2016-10-21: 190 mg via INTRAVENOUS

## 2016-10-21 MED ORDER — METOCLOPRAMIDE HCL 5 MG PO TABS: 5.0000 mg | ORAL_TABLET | Freq: Three times a day (TID) | ORAL | Status: DC | PRN

## 2016-10-21 MED ORDER — TOPIRAMATE 25 MG PO TABS
25.0000 mg | ORAL_TABLET | Freq: Every day | ORAL | Status: DC
  Administered 2016-10-21 – 2016-10-22 (×2): 25 mg via ORAL
  Filled 2016-10-21 (×2): qty 1

## 2016-10-21 MED ORDER — LACTATED RINGERS IV SOLN
INTRAVENOUS | Status: DC
  Administered 2016-10-21 (×2): via INTRAVENOUS

## 2016-10-21 MED ORDER — SCOPOLAMINE 1 MG/3DAYS TD PT72
MEDICATED_PATCH | TRANSDERMAL | Status: AC
  Filled 2016-10-21: qty 1

## 2016-10-21 MED ORDER — CYCLOBENZAPRINE HCL 10 MG PO TABS
10.0000 mg | ORAL_TABLET | Freq: Three times a day (TID) | ORAL | Status: DC | PRN
  Administered 2016-10-22 – 2016-10-23 (×2): 10 mg via ORAL
  Filled 2016-10-21 (×2): qty 1

## 2016-10-21 MED ORDER — MENTHOL 3 MG MT LOZG: 1.0000 | LOZENGE | OROMUCOSAL | Status: DC | PRN

## 2016-10-21 MED ORDER — METHOCARBAMOL 1000 MG/10ML IJ SOLN
500.0000 mg | Freq: Four times a day (QID) | INTRAVENOUS | Status: DC | PRN
  Administered 2016-10-21: 500 mg via INTRAVENOUS
  Filled 2016-10-21: qty 550
  Filled 2016-10-21: qty 5

## 2016-10-21 MED ORDER — BUPIVACAINE HCL (PF) 0.5 % IJ SOLN
INTRAMUSCULAR | Status: AC
  Filled 2016-10-21: qty 30

## 2016-10-21 MED ORDER — DIPHENHYDRAMINE HCL 12.5 MG/5ML PO ELIX: 12.5000 mg | ORAL_SOLUTION | ORAL | Status: DC | PRN

## 2016-10-21 MED ORDER — ACETAMINOPHEN 325 MG PO TABS
650.0000 mg | ORAL_TABLET | Freq: Four times a day (QID) | ORAL | Status: DC | PRN
  Administered 2016-10-21 – 2016-10-23 (×3): 650 mg via ORAL
  Filled 2016-10-21 (×3): qty 2

## 2016-10-21 MED ORDER — STERILE WATER FOR IRRIGATION IR SOLN
Status: DC | PRN
  Administered 2016-10-21: 2000 mL

## 2016-10-21 MED ORDER — DOCUSATE SODIUM 100 MG PO CAPS
100.0000 mg | ORAL_CAPSULE | Freq: Two times a day (BID) | ORAL | Status: DC
  Administered 2016-10-21 – 2016-10-23 (×4): 100 mg via ORAL
  Filled 2016-10-21 (×4): qty 1

## 2016-10-21 MED ORDER — SUGAMMADEX SODIUM 200 MG/2ML IV SOLN
INTRAVENOUS | Status: DC | PRN
  Administered 2016-10-21: 200 mg via INTRAVENOUS

## 2016-10-21 MED ORDER — LOSARTAN POTASSIUM-HCTZ 100-12.5 MG PO TABS: 1.0000 | ORAL_TABLET | Freq: Every day | ORAL | Status: DC

## 2016-10-21 MED ORDER — EPINEPHRINE PF 1 MG/ML IJ SOLN
INTRAMUSCULAR | Status: AC
  Filled 2016-10-21: qty 1

## 2016-10-21 MED ORDER — 0.9 % SODIUM CHLORIDE (POUR BTL) OPTIME
TOPICAL | Status: DC | PRN
  Administered 2016-10-21: 1000 mL

## 2016-10-21 MED ORDER — MIDAZOLAM HCL 2 MG/2ML IJ SOLN
INTRAMUSCULAR | Status: AC
  Filled 2016-10-21: qty 2

## 2016-10-21 MED ORDER — LIDOCAINE 2% (20 MG/ML) 5 ML SYRINGE
INTRAMUSCULAR | Status: DC | PRN
  Administered 2016-10-21: 80 mg via INTRAVENOUS

## 2016-10-21 MED ORDER — PHENOL 1.4 % MT LIQD
1.0000 | OROMUCOSAL | Status: DC | PRN
Start: 2016-10-21 — End: 2016-10-23
  Filled 2016-10-21: qty 177

## 2016-10-21 MED ORDER — SODIUM CHLORIDE 0.9 % IR SOLN
Status: DC | PRN
  Administered 2016-10-21: 1000 mL

## 2016-10-21 MED ORDER — METHOCARBAMOL 500 MG PO TABS
500.0000 mg | ORAL_TABLET | Freq: Four times a day (QID) | ORAL | Status: DC | PRN
  Administered 2016-10-22 (×2): 500 mg via ORAL
  Filled 2016-10-21 (×3): qty 1

## 2016-10-21 MED ORDER — CLONIDINE HCL 0.1 MG PO TABS: 0.1000 mg | ORAL_TABLET | Freq: Two times a day (BID) | ORAL | Status: DC | PRN

## 2016-10-21 MED ORDER — SODIUM CHLORIDE 0.9 % IV SOLN
INTRAVENOUS | Status: DC
  Administered 2016-10-21: 17:00:00 via INTRAVENOUS

## 2016-10-21 MED ORDER — KETOROLAC TROMETHAMINE 15 MG/ML IJ SOLN
7.5000 mg | Freq: Four times a day (QID) | INTRAMUSCULAR | Status: AC
  Administered 2016-10-21 – 2016-10-22 (×4): 7.5 mg via INTRAVENOUS
  Filled 2016-10-21 (×4): qty 1

## 2016-10-21 MED ORDER — CLINDAMYCIN PHOSPHATE 900 MG/50ML IV SOLN
900.0000 mg | INTRAVENOUS | Status: AC
  Administered 2016-10-21: 900 mg via INTRAVENOUS

## 2016-10-21 MED ORDER — DEXAMETHASONE SODIUM PHOSPHATE 10 MG/ML IJ SOLN
INTRAMUSCULAR | Status: AC
  Filled 2016-10-21: qty 1

## 2016-10-21 MED ORDER — ONDANSETRON HCL 4 MG PO TABS: 4.0000 mg | ORAL_TABLET | Freq: Four times a day (QID) | ORAL | Status: DC | PRN

## 2016-10-21 MED ORDER — FENTANYL CITRATE (PF) 100 MCG/2ML IJ SOLN
INTRAMUSCULAR | Status: DC | PRN
  Administered 2016-10-21: 50 ug via INTRAVENOUS
  Administered 2016-10-21: 100 ug via INTRAVENOUS
  Administered 2016-10-21: 50 ug via INTRAVENOUS

## 2016-10-21 MED ORDER — SCOPOLAMINE 1 MG/3DAYS TD PT72
1.0000 | MEDICATED_PATCH | TRANSDERMAL | Status: DC
  Administered 2016-10-21: 1.5 mg via TRANSDERMAL

## 2016-10-21 MED ORDER — ONDANSETRON HCL 4 MG/2ML IJ SOLN
INTRAMUSCULAR | Status: DC | PRN
  Administered 2016-10-21: 4 mg via INTRAVENOUS

## 2016-10-21 MED ORDER — CLINDAMYCIN PHOSPHATE 900 MG/50ML IV SOLN
INTRAVENOUS | Status: AC
  Filled 2016-10-21: qty 50

## 2016-10-21 MED ORDER — LOSARTAN POTASSIUM 50 MG PO TABS
100.0000 mg | ORAL_TABLET | Freq: Every day | ORAL | Status: DC
  Administered 2016-10-23 (×2): 100 mg via ORAL
  Filled 2016-10-21 (×3): qty 2

## 2016-10-21 MED ORDER — ACETAMINOPHEN 10 MG/ML IV SOLN
1000.0000 mg | Freq: Once | INTRAVENOUS | Status: AC
  Administered 2016-10-21: 1000 mg via INTRAVENOUS

## 2016-10-21 MED ORDER — BUPIVACAINE HCL (PF) 0.25 % IJ SOLN
INTRAMUSCULAR | Status: AC
  Filled 2016-10-21: qty 30

## 2016-10-21 MED ORDER — SUGAMMADEX SODIUM 200 MG/2ML IV SOLN
INTRAVENOUS | Status: AC
  Filled 2016-10-21: qty 2

## 2016-10-21 MED ORDER — ROCURONIUM BROMIDE 10 MG/ML (PF) SYRINGE
PREFILLED_SYRINGE | INTRAVENOUS | Status: DC | PRN
  Administered 2016-10-21: 5 mg via INTRAVENOUS
  Administered 2016-10-21: 45 mg via INTRAVENOUS

## 2016-10-21 MED ORDER — ALUM & MAG HYDROXIDE-SIMETH 200-200-20 MG/5ML PO SUSP
30.0000 mL | ORAL | Status: DC | PRN
Start: 2016-10-21 — End: 2016-10-23

## 2016-10-21 MED ORDER — METOCLOPRAMIDE HCL 5 MG/ML IJ SOLN: 10.0000 mg | Freq: Once | INTRAMUSCULAR | Status: DC | PRN

## 2016-10-21 MED ORDER — ONDANSETRON HCL 4 MG/2ML IJ SOLN: 4.0000 mg | Freq: Four times a day (QID) | INTRAMUSCULAR | Status: DC | PRN

## 2016-10-21 MED ORDER — MEPERIDINE HCL 25 MG/ML IJ SOLN: 25.0000 mg | INTRAMUSCULAR | Status: DC | PRN

## 2016-10-21 MED ORDER — ACETAMINOPHEN 10 MG/ML IV SOLN
INTRAVENOUS | Status: AC
  Administered 2016-10-21: 1000 mg via INTRAVENOUS
  Filled 2016-10-21: qty 100

## 2016-10-21 MED ORDER — MEPERIDINE HCL 50 MG/ML IJ SOLN: 6.2500 mg | INTRAMUSCULAR | Status: DC | PRN

## 2016-10-21 MED ORDER — CLINDAMYCIN PHOSPHATE 600 MG/50ML IV SOLN
600.0000 mg | Freq: Four times a day (QID) | INTRAVENOUS | Status: AC
  Administered 2016-10-21 – 2016-10-22 (×2): 600 mg via INTRAVENOUS
  Filled 2016-10-21 (×2): qty 50

## 2016-10-21 MED ORDER — ACETAMINOPHEN 650 MG RE SUPP: 650.0000 mg | Freq: Four times a day (QID) | RECTAL | Status: DC | PRN

## 2016-10-21 MED ORDER — METOCLOPRAMIDE HCL 5 MG/ML IJ SOLN: 5.0000 mg | Freq: Three times a day (TID) | INTRAMUSCULAR | Status: DC | PRN

## 2016-10-21 MED ORDER — BUPIVACAINE HCL (PF) 0.25 % IJ SOLN
INTRAMUSCULAR | Status: DC | PRN
  Administered 2016-10-21: 30 mL

## 2016-10-21 MED ORDER — FENTANYL CITRATE (PF) 100 MCG/2ML IJ SOLN
INTRAMUSCULAR | Status: AC
  Administered 2016-10-21: 25 ug via INTRAVENOUS
  Filled 2016-10-21: qty 2

## 2016-10-21 MED ORDER — FENTANYL CITRATE (PF) 100 MCG/2ML IJ SOLN
25.0000 ug | INTRAMUSCULAR | Status: DC | PRN
  Administered 2016-10-21 (×5): 25 ug via INTRAVENOUS

## 2016-10-21 MED ORDER — MIDAZOLAM HCL 5 MG/5ML IJ SOLN
INTRAMUSCULAR | Status: DC | PRN
  Administered 2016-10-21: 2 mg via INTRAVENOUS

## 2016-10-21 MED ORDER — MORPHINE SULFATE (PF) 2 MG/ML IV SOLN
2.0000 mg | INTRAVENOUS | Status: DC | PRN
  Administered 2016-10-21 – 2016-10-23 (×5): 2 mg via INTRAVENOUS
  Filled 2016-10-21 (×5): qty 1

## 2016-10-21 SURGICAL SUPPLY — 42 items
APL SKNCLS STERI-STRIP NONHPOA (GAUZE/BANDAGES/DRESSINGS) ×1
BAG SPEC THK2 15X12 ZIP CLS (MISCELLANEOUS) ×1
BAG ZIPLOCK 12X15 (MISCELLANEOUS) ×1 IMPLANT
BENZOIN TINCTURE PRP APPL 2/3 (GAUZE/BANDAGES/DRESSINGS) ×1 IMPLANT
BLADE SAW SGTL 18X1.27X75 (BLADE) ×2 IMPLANT
CAPT HIP TOTAL 2 ×1 IMPLANT
CELLS DAT CNTRL 66122 CELL SVR (MISCELLANEOUS) ×1 IMPLANT
CLOTH BEACON ORANGE TIMEOUT ST (SAFETY) ×2 IMPLANT
DRAPE STERI IOBAN 125X83 (DRAPES) ×2 IMPLANT
DRAPE U-SHAPE 47X51 STRL (DRAPES) ×4 IMPLANT
DRESSING AQUACEL AG SP 3.5X10 (GAUZE/BANDAGES/DRESSINGS) IMPLANT
DRSG AQUACEL AG SP 3.5X10 (GAUZE/BANDAGES/DRESSINGS) ×2
DURAPREP 26ML APPLICATOR (WOUND CARE) ×2 IMPLANT
ELECT REM PT RETURN 9FT ADLT (ELECTROSURGICAL) ×2
ELECTRODE REM PT RTRN 9FT ADLT (ELECTROSURGICAL) ×1 IMPLANT
GLOVE BIO SURGEON STRL SZ7.5 (GLOVE) ×2 IMPLANT
GLOVE BIOGEL PI IND STRL 6.5 (GLOVE) IMPLANT
GLOVE BIOGEL PI IND STRL 7.5 (GLOVE) IMPLANT
GLOVE BIOGEL PI IND STRL 8 (GLOVE) ×2 IMPLANT
GLOVE BIOGEL PI INDICATOR 6.5 (GLOVE) ×1
GLOVE BIOGEL PI INDICATOR 7.5 (GLOVE) ×3
GLOVE BIOGEL PI INDICATOR 8 (GLOVE) ×2
GLOVE ECLIPSE 8.0 STRL XLNG CF (GLOVE) ×2 IMPLANT
GLOVE SURG SS PI 6.5 STRL IVOR (GLOVE) ×1 IMPLANT
GLOVE SURG SS PI 7.5 STRL IVOR (GLOVE) ×1 IMPLANT
GOWN STRL REUS W/TWL LRG LVL3 (GOWN DISPOSABLE) ×2 IMPLANT
GOWN STRL REUS W/TWL XL LVL3 (GOWN DISPOSABLE) ×4 IMPLANT
HANDPIECE INTERPULSE COAX TIP (DISPOSABLE) ×2
HOLDER FOLEY CATH W/STRAP (MISCELLANEOUS) ×2 IMPLANT
PACK ANTERIOR HIP CUSTOM (KITS) ×2 IMPLANT
RETRACTOR WND ALEXIS 18 MED (MISCELLANEOUS) ×1 IMPLANT
RTRCTR WOUND ALEXIS 18CM MED (MISCELLANEOUS) ×2
SET HNDPC FAN SPRY TIP SCT (DISPOSABLE) ×1 IMPLANT
STRIP CLOSURE SKIN 1/2X4 (GAUZE/BANDAGES/DRESSINGS) ×1 IMPLANT
SUT ETHIBOND NAB CT1 #1 30IN (SUTURE) ×2 IMPLANT
SUT MNCRL AB 4-0 PS2 18 (SUTURE) ×1 IMPLANT
SUT VIC AB 0 CT1 36 (SUTURE) ×3 IMPLANT
SUT VIC AB 1 CT1 36 (SUTURE) ×2 IMPLANT
SUT VIC AB 2-0 CT1 27 (SUTURE) ×4
SUT VIC AB 2-0 CT1 TAPERPNT 27 (SUTURE) ×2 IMPLANT
TRAY FOLEY CATH SILVER 14FR (SET/KITS/TRAYS/PACK) ×1 IMPLANT
YANKAUER SUCT BULB TIP 10FT TU (MISCELLANEOUS) ×2 IMPLANT

## 2016-10-21 NOTE — Brief Op Note (Signed)
10/21/2016  1:51 PM  PATIENT:  Natalie Bond  60 y.o. female  PRE-OPERATIVE DIAGNOSIS:  osteoarthritis left hip  POST-OPERATIVE DIAGNOSIS:  osteoarthritis left hip  PROCEDURE:  Procedure(s): LEFT TOTAL HIP ARTHROPLASTY ANTERIOR APPROACH (Left)  SURGEON:  Surgeon(s) and Role:    * Mcarthur Rossetti, MD - Primary  PHYSICIAN ASSISTANT: Benita Stabile, PA-C  ANESTHESIA:   local and general  EBL:  Total I/O In: 1500 [I.V.:1500] Out: 400 [Urine:300; Blood:100]  LOCAL MEDICATIONS USED:  MARCAINE     COUNTS:  YES  DICTATION: .Other Dictation: Dictation Number [id number]  PLAN OF CARE: Admit to inpatient   PATIENT DISPOSITION:  PACU - hemodynamically stable.   Delay start of Pharmacological VTE agent (>24hrs) due to surgical blood loss or risk of bleeding: no

## 2016-10-21 NOTE — H&P (Signed)
TOTAL HIP ADMISSION H&P  Patient is admitted for left total hip arthroplasty.  Subjective:  Chief Complaint: left hip pain  HPI: Natalie Bond, 60 y.o. female, has a history of pain and functional disability in the left hip(s) due to arthritis and patient has failed non-surgical conservative treatments for greater than 12 weeks to include NSAID's and/or analgesics, corticosteriod injections, flexibility and strengthening excercises, use of assistive devices, weight reduction as appropriate and activity modification.  Onset of symptoms was gradual starting 2 years ago with gradually worsening course since that time.The patient noted no past surgery on the left hip(s).  Patient currently rates pain in the left hip at 10 out of 10 with activity. Patient has night pain, worsening of pain with activity and weight bearing, pain that interfers with activities of daily living and pain with passive range of motion. Patient has evidence of subchondral sclerosis, periarticular osteophytes and joint space narrowing by imaging studies. This condition presents safety issues increasing the risk of falls.  There is no current active infection.  Patient Active Problem List   Diagnosis Date Noted  . Unilateral primary osteoarthritis, left hip 09/23/2016    Class: Chronic  . Chronic back pain 01/14/2016  . Migraine 01/14/2016  . Essential hypertension 01/22/2015   Past Medical History:  Diagnosis Date  . Arthritis   . Cancer ([place])    skin cancer  . Complication of anesthesia   . GERD (gastroesophageal reflux disease)   . Headache   . High cholesterol   . History of bronchitis   . History of kidney stones   . Hypertension   . PONV (postoperative nausea and vomiting)   . Shortness of breath dyspnea   . Sleep apnea    pt. does not use a CPAP or BiPAP  . Stroke ([place])    Pt. states possible mini stroke in 2014  . Tuberculosis     Past Surgical History:  Procedure Laterality Date  . ABDOMINAL  HYSTERECTOMY    . BACK SURGERY    . CARDIAC CATHETERIZATION     01/27/15 [facility]): EF 60%, normal coronaries.  Marland Kitchen CATARACT EXTRACTION, BILATERAL    . EYE SURGERY    . LOWER EXTREMITY ANGIOGRAM Left 06/08/2015   Procedure: Ascending Venogram  Iliac Vein ;  Surgeon: Elam Dutch, MD;  Location: [facility] OR;  Service: Vascular;  Laterality: Left;  ;  TARA- [facility] / ZACH-[place] HAVE BEEN NOTIFIED/ CONFIRMED  . PARTIAL HYSTERECTOMY    . SHOULDER SURGERY    . WRIST SURGERY Bilateral    carpal tunnel    No prescriptions prior to admission.   Allergies  Allergen Reactions  . Dilaudid [Hydromorphone Hcl] Anaphylaxis    [name]   . Penicillins Anaphylaxis and Rash    Has patient had a PCN reaction causing immediate rash, facial/tongue/throat swelling, SOB or lightheadedness with hypotension: Yes Has patient had a PCN reaction causing severe rash involving mucus membranes or skin necrosis: No Has patient had a PCN reaction that required hospitalization Yes Has patient had a PCN reaction occurring within the last 10 years: No If all of the above answers are "NO", then may proceed with Cephalosporin use.     Social History  Substance Use Topics  . Smoking status: Never Smoker  . Smokeless tobacco: Never Used  . Alcohol use No    Family History  Problem Relation Age of Onset  . Cancer Mother   . Diabetes Mother   . Heart  disease Mother   . Hyperlipidemia Mother   . Hypertension Mother   . Cancer Father   . Diabetes Father   . Heart disease Father   . Hyperlipidemia Father   . Hypertension Father   . Varicose Veins Father   . Cancer Sister   . Heart disease Sister   . Hyperlipidemia Sister   . Cancer Brother   . Diabetes Brother   . Hyperlipidemia Brother      Review of Systems  Musculoskeletal: Positive for back pain and joint pain.  All other systems reviewed and are negative.   Objective:  Physical Exam  Constitutional: She is oriented to  person, place, and time. She appears well-developed and well-nourished.  HENT:  Head: Normocephalic and atraumatic.  Eyes: EOM are normal. Pupils are equal, round, and reactive to light.  Neck: Normal range of motion. Neck supple.  Cardiovascular: Normal rate.   Respiratory: Effort normal and breath sounds normal.  GI: Soft. Bowel sounds are normal.  Musculoskeletal:       Left hip: She exhibits decreased range of motion, decreased strength, tenderness and bony tenderness.  Neurological: She is alert and oriented to person, place, and time.  Skin: Skin is warm and dry.  Psychiatric: She has a normal mood and affect.    Vital signs in last 24 hours:    Labs:   Estimated body mass index is 29.05 kg/m as calculated from the following:   Height as of 10/14/16: 5\' 6"  (1.676 m).   Weight as of 10/14/16: 180 lb (81.6 kg).   Imaging Review Plain radiographs demonstrate severe degenerative joint disease of the left hip(s). The bone quality appears to be excellent for age and reported activity level.  Assessment/Plan:  End stage arthritis, left hip(s)  The patient history, physical examination, clinical judgement of the provider and imaging studies are consistent with end stage degenerative joint disease of the left hip(s) and total hip arthroplasty is deemed medically necessary. The treatment options including medical management, injection therapy, arthroscopy and arthroplasty were discussed at length. The risks and benefits of total hip arthroplasty were presented and reviewed. The risks due to aseptic loosening, infection, stiffness, dislocation/subluxation,  thromboembolic complications and other imponderables were discussed.  The patient acknowledged the explanation, agreed to proceed with the plan and consent was signed. Patient is being admitted for inpatient treatment for surgery, pain control, PT, OT, prophylactic antibiotics, VTE prophylaxis, progressive ambulation and ADL's and  discharge planning.The patient is planning to be discharged home with home health services

## 2016-10-21 NOTE — Anesthesia Postprocedure Evaluation (Signed)
Anesthesia Post Note  Patient: LACONIA LAGASSE  Procedure(s) Performed: Procedure(s) (LRB): LEFT TOTAL HIP ARTHROPLASTY ANTERIOR APPROACH (Left)  Patient location during evaluation: PACU Anesthesia Type: General Level of consciousness: awake and alert and oriented Pain management: satisfactory to patient Respiratory status: nonlabored ventilation, spontaneous breathing, respiratory function stable and patient connected to nasal cannula oxygen Cardiovascular status: blood pressure returned to baseline and stable Postop Assessment: no signs of nausea or vomiting Anesthetic complications: no    Last Vitals:  Vitals:   10/21/16 1450 10/21/16 1500  BP:  121/75  Pulse: 78 73  Resp: 11 14  Temp:      Last Pain:  Vitals:   10/21/16 1500  TempSrc:   PainSc: 0-No pain                 Jaylene Schrom A.

## 2016-10-21 NOTE — Op Note (Signed)
NAMELISSA, OLIVA                 ACCOUNT NO.:  [id number]  MEDICAL RECORD NO.:  [id number]  LOCATION:                               FACILITY:  [facility]  PHYSICIAN:  Lind Guest. Ninfa Linden, M.D.DATE OF BIRTH:  Feb 20, 1956  DATE OF PROCEDURE:  10/21/2016 DATE OF DISCHARGE:                              OPERATIVE REPORT   PREOPERATIVE DIAGNOSIS:  Primary osteoarthritis and degenerative joint disease, left hip.  POSTOPERATIVE DIAGNOSIS:  Primary osteoarthritis and degenerative joint disease, left hip.  PROCEDURE:  Left total hip arthroplasty through direct anterior approach.  IMPLANTS:  DePuy Sector Gription acetabular component size 52, size 36+ 2 neutral polyethylene liner, size 12 Corail femoral component with standard offset, size 36+ 1.5 ceramic hip ball.  SURGEON:  Lind Guest. Ninfa Linden, M.D.  ASSISTANT:  Erskine Emery, PA-C.  ANESTHESIA: 1. General. 2. Local with 0.25% plain Marcaine.  BLOOD LOSS:  Less than 300 mL.  ANTIBIOTICS:  2 g of IV Ancef.  COMPLICATIONS:  None.  INDICATIONS:  Ms. Schurman is a 60 year old female with debilitating arthritis and pain involving her left hip.  She has tried and failed all forms of conservative treatment, and at this point, does wish to proceed with a total hip arthroplasty through direct anterior approach.  She understands the risk of acute blood loss anemia, nerve and vessel injury, fracture, infection, dislocation, and DVT.  She understands our goals are decreased pain, improved mobility, and overall improved quality of life.  PROCEDURE DESCRIPTION:  After informed consent was obtained, appropriate left hip was marked.  She was brought to the operating room, where general anesthesia was obtained while she was on her stretcher.  A Foley catheter was placed and both feet had traction boots applied to them. Next, she was placed supine on the Hana fracture table with the perineal post in place and both legs in inline skeletal  traction devices, but no traction applied.  Her left operative hip was prepped and draped with DuraPrep and sterile drapes.  Time-out was called and she was identified as correct patient and correct left hip.  We then made incision just inferior and posterior to the anterior superior iliac spine and carried this obliquely down the leg.  We dissected down the tensor fascia lata muscle.  The tensor fascia was then divided longitudinally to proceed with a direct anterior approach to the hip.  We identified and cauterized the circumflex vessels and then identified the hip capsule. I opened up the hip capsule, finding a very large joint effusion.  We placed out Cobra retractors around the medial and lateral femoral neck within joint capsule and made our femoral neck cut proximal to the lesser trochanter with an oscillating saw and completed this with an osteotome.  I placed a corkscrew guide in the femoral head and removed the femoral head in its entirety and found it to be devoid of cartilage. I then cleaned the acetabulum and other debris including remnants of the acetabular labrum.  I placed a bent Hohmann over the medial acetabular rim and then began reaming from a size 42 reamer in 2-mm increments up to a size 52 with all reamers under direct visualization, the last  reamer under direct fluoroscopy, so we could obtain our depth of reaming, our inclination and anteversion.  Once we were pleased with this, we placed the real DePuy Sector Gription acetabular component size 52 and a 36+ 0 polyethylene liner for that size acetabular component. Attention was then turned to the femur.  With the leg externally rotated to 120 degrees, extended and adducted, we were able to use a box cutting osteotome to enter the femoral canal and a rongeur to lateralize.  I did place a Mueller retractor medially and a Hohmann retractor behind the greater trochanter.  I released the lateral joint capsule.  We  then began broaching from a size 8 broach going up to a size 12.  With the size 12, we used a calcar planer and then our standard offset femoral neck trial with the 36+ 1.5.  We brought the leg back over and up with traction and internal rotation reducing the pelvis and we were pleased with leg lengths because preoperatively her leg lengths were dead-on the stretcher and our preoperative films from our office.  We matched the films, then we put her on the table, showing that it looks like her leg much longer on the right than her left operative side, but that is not true, we actually matched those pictures.  We could see on the table she was matched as well.  We then dislocated the hip and removed the trial components.  We were able to place the real Corail femoral component size 12 with standard offset and the real 36+ 1.5 ceramic hip ball.  We reduced this in the acetabulum and it was stable.  We then copiously irrigated the soft tissue with normal saline solution using pulsatile lavage.  We closed the joint capsule with interrupted #1 Ethibond suture followed by running #1 Vicryl in the tensor fascia, 0 Vicryl in the deep tissue, 2-0 Vicryl in the subcutaneous tissue, 4-0 Monocryl subcuticular stitch and Steri-Strips on the skin.  An Aquacel dressing was applied. She was taken off the Hana table, awakened, extubated and taken to the recovery room in stable condition.  All final counts were correct. There were no complications noted.  Of note, Erskine Emery, PA-C assisted in the entire case.  His assistance was crucial from the beginning and the end of this case for helping and facilitating the case as well.     Lind Guest. Ninfa Linden, M.D.   ______________________________ Lind Guest. Ninfa Linden, M.D.    CYB/MEDQ  D:  10/21/2016  T:  10/21/2016  Job:  [id number]

## 2016-10-21 NOTE — Transfer of Care (Signed)
Immediate Anesthesia Transfer of Care Note  [name]  Procedure(s) Performed: Procedure(s): LEFT TOTAL HIP ARTHROPLASTY ANTERIOR APPROACH (Left)  Patient Location: PACU  Anesthesia Type:General  Level of Consciousness:  sedated, patient cooperative and responds to stimulation  Airway & Oxygen Therapy:Patient Spontanous Breathing and Patient connected to face mask oxgen  Post-op Assessment:  Report given to PACU RN and Post -op Vital signs reviewed and stable  Post vital signs:  Reviewed and stable  Last Vitals:  Vitals:   10/21/16 1038  BP: (!) 169/90  Pulse: 84  Resp: 16  Temp: [id number] C    Complications: No apparent anesthesia complications

## 2016-10-21 NOTE — Op Note (Deleted)
  The note originally documented on this encounter has been moved the the encounter in which it belongs.  

## 2016-10-21 NOTE — Anesthesia Procedure Notes (Signed)
Procedure Name: Intubation Date/Time: 10/21/2016 1:02 PM Performed by: Hudsyn Champine, Virgel Gess Pre-anesthesia Checklist: Patient identified, Emergency Drugs available, Suction available, Patient being monitored and Timeout performed Patient Re-evaluated:Patient Re-evaluated prior to inductionOxygen Delivery Method: Circle system utilized Preoxygenation: Pre-oxygenation with 100% oxygen Intubation Type: IV induction Ventilation: Mask ventilation without difficulty Laryngoscope Size: Mac and 4 Grade View: Grade I Tube type: Oral Tube size: 7.5 mm Number of attempts: 1 Airway Equipment and Method: Stylet Placement Confirmation: ETT inserted through vocal cords under direct vision,  positive ETCO2,  CO2 detector and breath sounds checked- equal and bilateral Secured at: 21 cm Tube secured with: Tape Dental Injury: Teeth and Oropharynx as per pre-operative assessment

## 2016-10-21 NOTE — Anesthesia Preprocedure Evaluation (Addendum)
Anesthesia Evaluation   Patient awake    Reviewed: Allergy & Precautions, NPO status , Patient's Chart, lab work & pertinent test results  History of Anesthesia Complications (+) PONV and history of anesthetic complications  Airway Mallampati: I       Dental  (+) Edentulous Lower, Edentulous Upper   Pulmonary shortness of breath and with exertion, sleep apnea and Continuous Positive Airway Pressure Ventilation ,    Pulmonary exam normal breath sounds clear to auscultation       Cardiovascular hypertension, Pt. on medications Normal cardiovascular exam Rhythm:Regular Rate:Normal     Neuro/Psych  Headaches, CVA, No Residual Symptoms negative psych ROS   GI/Hepatic GERD  ,  Endo/Other  Hyperlipidemia  Renal/GU Hx/o renal calculi  negative genitourinary   Musculoskeletal  (+) Arthritis , Osteoarthritis,  Severe OA left hip Chronic low back pain   Abdominal Normal abdominal exam  (+)   Peds  Hematology negative hematology ROS (+)   Anesthesia Other Findings   Reproductive/Obstetrics negative OB ROS                            Anesthesia Physical Anesthesia Plan  ASA: III  Anesthesia Plan: General   Post-op Pain Management:    Induction: Intravenous  Airway Management Planned: Oral ETT  Additional Equipment:   Intra-op Plan:   Post-operative Plan: Extubation in OR  Informed Consent: I have reviewed the patients History and Physical, chart, labs and discussed the procedure including the risks, benefits and alternatives for the proposed anesthesia with the patient or authorized representative who has indicated his/her understanding and acceptance.   Dental advisory given  Plan Discussed with: Anesthesiologist, CRNA and Surgeon  Anesthesia Plan Comments:         Anesthesia Quick Evaluation

## 2016-10-22 LAB — BASIC METABOLIC PANEL
Anion gap: 7 (ref 5–15)
BUN: 11 mg/dL (ref 6–20)
CHLORIDE: 102 mmol/L (ref 101–111)
CO2: 26 mmol/L (ref 22–32)
CREATININE: 0.96 mg/dL (ref 0.44–1.00)
Calcium: 8.4 mg/dL — ABNORMAL LOW (ref 8.9–10.3)
Glucose, Bld: 143 mg/dL — ABNORMAL HIGH (ref 65–99)
POTASSIUM: 3.6 mmol/L (ref 3.5–5.1)
SODIUM: 135 mmol/L (ref 135–145)

## 2016-10-22 LAB — CBC
HEMATOCRIT: 31.6 % — AB (ref 36.0–46.0)
HEMOGLOBIN: 10.7 g/dL — AB (ref 12.0–15.0)
MCH: 31.1 pg (ref 26.0–34.0)
MCHC: 33.9 g/dL (ref 30.0–36.0)
MCV: 91.9 fL (ref 78.0–100.0)
Platelets: 310 10*3/uL (ref 150–400)
RBC: 3.44 MIL/uL — ABNORMAL LOW (ref 3.87–5.11)
RDW: 13.3 % (ref 11.5–15.5)
WBC: 11.3 10*3/uL — ABNORMAL HIGH (ref 4.0–10.5)

## 2016-10-22 NOTE — Progress Notes (Signed)
Subjective: 1 Day Post-Op Procedure(s) (LRB): LEFT TOTAL HIP ARTHROPLASTY ANTERIOR APPROACH (Left) Patient reports pain as moderate.    Objective: Vital signs in last 24 hours: Temp:  [97.1 F (36.2 C)-98.5 F (36.9 C)] 98.5 F (36.9 C) (11/25 0612) Pulse Rate:  [67-93] 78 (11/25 0612) Resp:  [11-20] 16 (11/25 0612) BP: (109-169)/(58-90) 110/58 (11/25 0612) SpO2:  [92 %-100 %] 98 % (11/25 0612) Weight:  [180 lb (81.6 kg)] 180 lb (81.6 kg) (11/24 1610)  Intake/Output from previous day: 11/24 0701 - 11/25 0700 In: 3191.3 [P.O.:700; I.V.:2336.3; IV Piggyback:155] Out: 2355 [Urine:2255; Blood:100] Intake/Output this shift: No intake/output data recorded.   Recent Labs  10/22/16 0441  HGB 10.7*    Recent Labs  10/22/16 0441  WBC 11.3*  RBC 3.44*  HCT 31.6*  PLT 310    Recent Labs  10/22/16 0441  NA 135  K 3.6  CL 102  CO2 26  BUN 11  CREATININE 0.96  GLUCOSE 143*  CALCIUM 8.4*   No results for input(s): LABPT, INR in the last 72 hours.  Sensation intact distally Intact pulses distally Dorsiflexion/Plantar flexion intact Incision: dressing C/D/I  Assessment/Plan: 1 Day Post-Op Procedure(s) (LRB): LEFT TOTAL HIP ARTHROPLASTY ANTERIOR APPROACH (Left) Up with therapy  Mcarthur Rossetti 10/22/2016, 9:38 AM

## 2016-10-22 NOTE — Progress Notes (Addendum)
Physical Therapy Treatment Patient Details Name: Natalie Bond MRN: [id number] DOB: 12/04/1955 Today's Date: Nov 04, 2016    History of Present Illness s/p LEFT TOTAL HIP ARTHROPLASTY ANTERIOR APPROACH (Left)    PT Comments    Pt progressing well with mobility and pleased with same.  Follow Up Recommendations  Home health PT     Equipment Recommendations  RW    Recommendations for Other Services OT consult     Precautions / Restrictions Precautions Precautions: None Restrictions Weight Bearing Restrictions: No Other Position/Activity Restrictions: WBAT    Mobility  Bed Mobility Overal bed mobility: Needs Assistance Bed Mobility: Sit to Supine       Sit to supine: Min assist   General bed mobility comments: cues for sequence and use of R LE to self assist  Transfers Overall transfer level: Needs assistance Equipment used: Rolling walker (2 wheeled) Transfers: Sit to/from Stand Sit to Stand: Min assist;Min guard         General transfer comment: cues for LE management and use of UEs to self assist.  Pt to/from EOB and to/from [facility]  Ambulation/Gait Ambulation/Gait assistance: Min assist;Min guard Ambulation Distance (Feet): 100 Feet (and 15' into bathroom) Assistive device: Rolling walker (2 wheeled) Gait Pattern/deviations: Step-to pattern;Decreased step length - right;Decreased step length - left;Shuffle;Trunk flexed Gait velocity: decr Gait velocity interpretation: Below normal speed for age/gender General Gait Details: cues for sequence, posture and position from [employer]            Wheelchair Mobility    Modified Rankin (Stroke Patients Only)       Balance                                    Cognition Arousal/Alertness: Awake/alert Behavior During Therapy: WFL for tasks assessed/performed Overall Cognitive Status: Within Functional Limits for tasks assessed                      Exercises      General  Comments        Pertinent Vitals/Pain Pain Assessment: 0-10 Pain Score: 7  Pain Location: L hip Pain Descriptors / Indicators: Aching;Sore Pain Intervention(s): Limited activity within patient's tolerance;Monitored during session;Premedicated before session;Ice applied    Home Living                      Prior Function            PT Goals (current goals can now be found in the care plan section) Acute Rehab PT Goals Patient Stated Goal: less pain PT Goal Formulation: With patient Time For Goal Achievement: 10/26/16 Potential to Achieve Goals: Good Progress towards PT goals: Progressing toward goals    Frequency    7X/week      PT Plan Current plan remains appropriate    Co-evaluation             End of Session Equipment Utilized During Treatment: Gait belt Activity Tolerance: Patient tolerated treatment well Patient left: in bed;with call bell/phone within reach;with family/visitor present     Time: [id number] PT Time Calculation (min) (ACUTE ONLY): 38 min  Charges:  $Gait Training: 23-37 mins $Therapeutic Activity: 8-22 mins                    G Codes:      Betheny Suchecki Nov 04, 2016, 6:07 PM

## 2016-10-22 NOTE — Evaluation (Signed)
Physical Therapy Evaluation Patient Details Name: Natalie Bond MRN: [id number] DOB: Jun 05, 1956 Today's Date: 10/22/2016   History of Present Illness  s/p LEFT TOTAL HIP ARTHROPLASTY ANTERIOR APPROACH (Left)  Clinical Impression  Pt s/p L THR presents with decreased L LE strength/ROM and post op pain limiting functional mobility.  Pt should progress to dc home with family assist and HHPT follow up.    Follow Up Recommendations Home health PT    Equipment Recommendations  None recommended by PT    Recommendations for Other Services OT consult     Precautions / Restrictions Precautions Precautions: None Restrictions Weight Bearing Restrictions: No Other Position/Activity Restrictions: WBAT      Mobility  Bed Mobility Overal bed mobility: Needs Assistance Bed Mobility: Supine to Sit     Supine to sit: Min assist Sit to supine: Min assist   General bed mobility comments: assist for LLE, cues for technique  Transfers Overall transfer level: Needs assistance Equipment used: Rolling walker (2 wheeled) Transfers: Sit to/from Stand Sit to Stand: Min assist         General transfer comment: cues for sequence and use of R LE to self assist  Ambulation/Gait Ambulation/Gait assistance: Min assist Ambulation Distance (Feet): 50 Feet Assistive device: Rolling walker (2 wheeled) Gait Pattern/deviations: Step-to pattern;Decreased step length - right;Decreased step length - left;Shuffle;Trunk flexed Gait velocity: decr Gait velocity interpretation: Below normal speed for age/gender General Gait Details: cues for sequence, posture and position from [employer]            Wheelchair Mobility    Modified Rankin (Stroke Patients Only)       Balance                                             Pertinent Vitals/Pain Pain Assessment: 0-10 Pain Score: 7  Pain Location: L hip Pain Descriptors / Indicators: Aching;Sore Pain Intervention(s):  Limited activity within patient's tolerance;Monitored during session;Premedicated before session;Ice applied    Home Living Family/patient expects to be discharged to:: Private residence Living Arrangements: Spouse/significant other Available Help at Discharge: Family Type of Home: House Home Access: Stairs to enter Entrance Stairs-Rails: Right Entrance Stairs-Number of Steps: 3 Home Layout: One level Home Equipment: Environmental consultant - 2 wheels Additional Comments: Plans to borrow RW    Prior Function Level of Independence: Independent               Hand Dominance   Dominant Hand: Right    Extremity/Trunk Assessment   Upper Extremity Assessment: Overall WFL for tasks assessed           Lower Extremity Assessment: LLE deficits/detail   LLE Deficits / Details: Strength at hip 2/5 with AAROM at hip to 75 flex and 10 abd  Cervical / Trunk Assessment: Normal  Communication   Communication: No difficulties  Cognition Arousal/Alertness: Awake/alert Behavior During Therapy: WFL for tasks assessed/performed Overall Cognitive Status: Within Functional Limits for tasks assessed                      General Comments      Exercises Total Joint Exercises Ankle Circles/Pumps: AROM;Both;15 reps;Supine Quad Sets: AROM;Both;10 reps;Supine Heel Slides: AAROM;Left;20 reps;Supine Hip ABduction/ADduction: AAROM;Left;15 reps;Supine   Assessment/Plan    PT Assessment Patient needs continued PT services  PT Problem List Decreased strength;Decreased range of motion;Decreased activity tolerance;Decreased mobility;Pain;Decreased  knowledge of use of DME          PT Treatment Interventions DME instruction;Gait training;Stair training;Functional mobility training;Therapeutic activities;Therapeutic exercise;Patient/family education    PT Goals (Current goals can be found in the Care Plan section)  Acute Rehab PT Goals Patient Stated Goal: less pain PT Goal Formulation: With  patient Time For Goal Achievement: 10/26/16 Potential to Achieve Goals: Good    Frequency 7X/week   Barriers to discharge        Co-evaluation               End of Session Equipment Utilized During Treatment: Gait belt Activity Tolerance: Patient tolerated treatment well Patient left: in chair;with call bell/phone within reach Nurse Communication: Mobility status         Time: [id number] PT Time Calculation (min) (ACUTE ONLY): 38 min   Charges:   PT Evaluation $PT Eval Low Complexity: 1 Procedure PT Treatments $Gait Training: 8-22 mins $Therapeutic Exercise: 8-22 mins   PT G Codes:        Joyanna Kleman 10-29-16, 12:37 PM

## 2016-10-22 NOTE — Progress Notes (Signed)
Occupational Therapy Evaluation Patient Details Name: Natalie Bond MRN: [id number] DOB: [birth year] Today's Date: 10/22/2016    History of Present Illness s/p LEFT TOTAL HIP ARTHROPLASTY ANTERIOR APPROACH (Left)   Clinical Impression   Patient presents to OT with decreased ADL independence due to the deficits listed below. She will benefit from skilled OT to maximize function and to facilitate a safe discharge. OT will follow.    Follow Up Recommendations  No OT follow up;Supervision/Assistance - 24 hour    Equipment Recommendations  3 in 1 bedside comode    Recommendations for Other Services       Precautions / Restrictions Precautions Precautions: None Restrictions Weight Bearing Restrictions: No Other Position/Activity Restrictions: WBAT      Mobility Bed Mobility Overal bed mobility: Needs Assistance Bed Mobility: Sit to Supine       Sit to supine: Min assist   General bed mobility comments: assist for LLE, cues for technique  Transfers Overall transfer level: Needs assistance Equipment used: Rolling walker (2 wheeled) Transfers: Sit to/from Stand Sit to Stand: Min guard              Balance                                            ADL Overall ADL's : Needs assistance/impaired Eating/Feeding: Independent;Sitting   Grooming: Wash/dry hands;Min Dispensing optician: Min guard;Ambulation;BSC;RW   Toileting- Water quality scientist and Hygiene: Min guard;Sit to/from stand       Functional mobility during ADLs: Min guard;Rolling walker       Vision     Perception     Praxis      Pertinent Vitals/Pain Pain Assessment: 0-10 Pain Score: 7  Pain Location: L hip Pain Descriptors / Indicators: Aching;Sore Pain Intervention(s): Monitored during session;RN gave pain meds during session;Ice applied;Repositioned     Hand Dominance Right   Extremity/Trunk Assessment Upper Extremity  Assessment Upper Extremity Assessment: Overall WFL for tasks assessed   Lower Extremity Assessment Lower Extremity Assessment: Defer to PT evaluation   Cervical / Trunk Assessment Cervical / Trunk Assessment: Normal   Communication Communication Communication: No difficulties   Cognition Arousal/Alertness: Awake/alert Behavior During Therapy: WFL for tasks assessed/performed Overall Cognitive Status: Within Functional Limits for tasks assessed                     General Comments       Exercises       Shoulder Instructions      Home Living Family/patient expects to be discharged to:: Private residence Living Arrangements: Spouse/significant other Available Help at Discharge: Family Type of Home: House Home Access: Stairs to enter Technical brewer of Steps: 2   Home Layout: One level     Bathroom Shower/Tub: Occupational psychologist: Standard Bathroom Accessibility: Yes How Accessible: Accessible via walker Home Equipment: None          Prior Functioning/Environment Level of Independence: Independent                 OT Problem List: Decreased strength;Decreased activity tolerance;Decreased knowledge of use of DME or AE;Pain   OT Treatment/Interventions: Self-care/ADL training;DME and/or AE instruction;Therapeutic activities;Patient/family education    OT Goals(Current goals can be found in the  care plan section) Acute Rehab OT Goals Patient Stated Goal: less pain OT Goal Formulation: With patient Time For Goal Achievement: 11/05/16 Potential to Achieve Goals: Good ADL Goals Pt Will Perform Lower Body Bathing: with supervision;sit to/from stand Pt Will Perform Lower Body Dressing: with supervision;sit to/from stand Pt Will Transfer to Toilet: with supervision;ambulating;bedside commode Pt Will Perform Toileting - Clothing Manipulation and hygiene: with supervision;sit to/from stand Pt Will Perform Tub/Shower Transfer: Shower  transfer;with min guard assist;ambulating;3 in 1;rolling walker  OT Frequency: Min 2X/week   Barriers to D/C:            Co-evaluation              End of Session Equipment Utilized During Treatment: Surveyor, mining Communication: Mobility status  Activity Tolerance: Patient tolerated treatment well Patient left: in bed;with call bell/phone within reach;with nursing/sitter in room;with family/visitor present   Time: 1200-1218 OT Time Calculation (min): 18 min Charges:  OT General Charges $OT Visit: 1 Procedure OT Evaluation $OT Eval Low Complexity: 1 Procedure G-Codes:    Danney Bungert A 2016/10/24, 12:25 PM

## 2016-10-23 MED ORDER — OXYCODONE-ACETAMINOPHEN 5-325 MG PO TABS: 1.0000 | ORAL_TABLET | ORAL | 0 refills | Status: DC | PRN

## 2016-10-23 MED ORDER — CYCLOBENZAPRINE HCL 10 MG PO TABS: 10.0000 mg | ORAL_TABLET | Freq: Three times a day (TID) | ORAL | 0 refills | Status: DC | PRN

## 2016-10-23 MED ORDER — ASPIRIN 81 MG PO CHEW: 81.0000 mg | CHEWABLE_TABLET | Freq: Two times a day (BID) | ORAL | 0 refills | Status: DC

## 2016-10-23 NOTE — Progress Notes (Signed)
Physical Therapy Treatment Patient Details Name: ONIESHA LASKO MRN: [id number] DOB: 12/05/1955 Today's Date: 10/23/2016    History of Present Illness s/p LEFT TOTAL HIP ARTHROPLASTY ANTERIOR APPROACH (Left)    PT Comments    POD # 2 am session Assisted OOB to amvb to bathroom per pt request however unable to tolerate full session due to 10/10 hip pain.  Assisted back to bed, applied ICE and notified RN.  Will return later to complete session esp to address stairs.    Follow Up Recommendations  Home health PT     Equipment Recommendations  Rolling walker with 5" wheels    Recommendations for Other Services       Precautions / Restrictions Precautions Precautions: None Restrictions Weight Bearing Restrictions: No Other Position/Activity Restrictions: WBAT    Mobility  Bed Mobility Overal bed mobility: Needs Assistance Bed Mobility: Sit to Supine;Supine to Sit     Supine to sit: Min guard;Min assist Sit to supine: Min assist;Mod assist   General bed mobility comments: assist with L LE    More assist back to bed due to pain  Transfers Overall transfer level: Needs assistance Equipment used: Rolling walker (2 wheeled) Transfers: Sit to/from [facility] Sit to Stand: Min guard;Min assist Stand pivot transfers: Min guard;Min assist       General transfer comment: 25% VC's on proper hand placement and safety with turns.  Pt distracted by her pain level.   Ambulation/Gait Ambulation/Gait assistance: Min guard;Min assist Ambulation Distance (Feet): 11 Feet (to and from bathroom only) Assistive device: Rolling walker (2 wheeled) Gait Pattern/deviations: Step-to pattern;Decreased stance time - left     General Gait Details: cues for sequence, posture and position from RW.  Pt distracted by her high pain level.    Stairs            Wheelchair Mobility    Modified Rankin (Stroke Patients Only)       Balance                                     Cognition Arousal/Alertness: Awake/alert Behavior During Therapy: WFL for tasks assessed/performed Overall Cognitive Status: Within Functional Limits for tasks assessed                      Exercises      General Comments        Pertinent Vitals/Pain Pain Assessment: 0-10 Pain Score: 10-Worst pain ever Pain Location: L hip Pain Descriptors / Indicators: Operative site guarding;Discomfort;Grimacing;Tightness;Tender Pain Intervention(s): Limited activity within patient's tolerance;Repositioned;Monitored during session;Ice applied    Home Living                      Prior Function            PT Goals (current goals can now be found in the care plan section) Progress towards PT goals: Progressing toward goals    Frequency    7X/week      PT Plan Current plan remains appropriate    Co-evaluation             End of Session Equipment Utilized During Treatment: Gait belt Activity Tolerance: Patient limited by pain Patient left: in bed;with call bell/phone within reach     Time: 0942-0955 PT Time Calculation (min) (ACUTE ONLY): 13 min  Charges:  $Gait Training: 8-22 mins  G Codes:      Rica Koyanagi  PTA [facility]  Acute  Rehab Pager      [phone]

## 2016-10-23 NOTE — Progress Notes (Signed)
Pt requested to have her bp taken.  It was 153/90.  She asked is she could have her Cozaar that she did not take earlier in the day. She did not qualify for the prn Coreg because her bp was not 160.  I administered the Coozar 100 mg to pt as she requested.

## 2016-10-23 NOTE — Discharge Summary (Signed)
Patient ID: Natalie Bond MRN: [id number] DOB/AGE: [birth year] 60 y.o.  Admit date: 10/21/2016 Discharge date: 10/23/2016  Admission Diagnoses:  Principal Problem:   Unilateral primary osteoarthritis, left hip Active Problems:   Status post left hip replacement   Discharge Diagnoses:  Same  Past Medical History:  Diagnosis Date  . Arthritis   . Cancer ([place])    skin cancer  . Complication of anesthesia   . GERD (gastroesophageal reflux disease)   . Headache   . High cholesterol   . History of bronchitis   . History of kidney stones   . Hypertension   . PONV (postoperative nausea and vomiting)   . Shortness of breath dyspnea   . Sleep apnea    pt. does not use a CPAP or BiPAP  . Stroke ([place])    Pt. states possible mini stroke in 2014  . Tuberculosis     Surgeries: Procedure(s): LEFT TOTAL HIP ARTHROPLASTY ANTERIOR APPROACH on 10/21/2016   Consultants:   Discharged Condition: Improved  Hospital Course: Natalie Bond is an 60 y.o. female who was admitted 10/21/2016 for operative treatment ofUnilateral primary osteoarthritis, left hip. Patient has severe unremitting pain that affects sleep, daily activities, and work/hobbies. After pre-op clearance the patient was taken to the operating room on 10/21/2016 and underwent  Procedure(s): LEFT TOTAL HIP ARTHROPLASTY ANTERIOR APPROACH.    Patient was given perioperative antibiotics: Anti-infectives    Start     Dose/Rate Route Frequency Ordered Stop   10/21/16 2100  clindamycin (CLEOCIN) IVPB 600 mg     600 mg 100 mL/hr over 30 Minutes Intravenous Every 6 hours 10/21/16 1621 10/22/16 0330   10/21/16 1025  clindamycin (CLEOCIN) IVPB 900 mg     900 mg 100 mL/hr over 30 Minutes Intravenous On call to O.R. 10/21/16 1025 10/21/16 1244       Patient was given sequential compression devices, early ambulation, and chemoprophylaxis to prevent DVT.  Patient benefited maximally from hospital stay and there were no  complications.    Recent vital signs: Patient Vitals for the past 24 hrs:  BP Temp Temp src Pulse Resp SpO2  10/23/16 0611 (!) 145/81 98.5 F (36.9 C) Oral 89 16 95 %  10/23/16 0200 (!) 153/90 - - - - -  10/22/16 2100 133/68 98.4 F (36.9 C) Oral 72 16 97 %  10/22/16 1356 (!) 111/57 97.3 F (36.3 C) Oral 73 16 95 %  10/22/16 1122 107/60 - - 76 - -  10/22/16 1119 (!) 87/57 98.6 F (37 C) Tympanic 80 17 95 %     Recent laboratory studies:  Recent Labs  10/22/16 0441  WBC 11.3*  HGB 10.7*  HCT 31.6*  PLT 310  NA 135  K 3.6  CL 102  CO2 26  BUN 11  CREATININE 0.96  GLUCOSE 143*  CALCIUM 8.4*     Discharge Medications:     Medication List    STOP taking these medications   diclofenac 50 MG EC tablet Commonly known as:  VOLTAREN     TAKE these medications   aspirin 81 MG chewable tablet Chew 1 tablet (81 mg total) by mouth 2 (two) times daily.   aspirin-acetaminophen-caffeine 250-250-65 MG tablet Commonly known as:  EXCEDRIN MIGRAINE Take 2 tablets by mouth every 6 (six) hours as needed for headache or migraine.   cloNIDine 0.1 MG tablet Commonly known as:  CATAPRES Take 0.1 mg by mouth 2 (two) times daily as needed (Blood pressure is over 160).  If BP is over 160   cyclobenzaprine 10 MG tablet Commonly known as:  FLEXERIL Take 1 tablet (10 mg total) by mouth 3 (three) times daily as needed for muscle spasms.   HYDROcodone-acetaminophen 5-325 MG tablet Commonly known as:  NORCO/VICODIN Take 1 tablet by mouth every 6 (six) hours as needed for moderate pain.   losartan-hydrochlorothiazide 100-12.5 MG tablet Commonly known as:  HYZAAR Take 1 tablet by mouth daily.   oxyCODONE-acetaminophen 5-325 MG tablet Commonly known as:  ROXICET Take 1-2 tablets by mouth every 4 (four) hours as needed.   SYSTANE OP Apply 1 drop to eye daily as needed (dry eyes). Preservative free   topiramate 25 MG tablet Commonly known as:  TOPAMAX Take 25 mg by mouth at  bedtime.            Durable Medical Equipment        Start     Ordered   10/21/16 1622  DME Walker rolling  Once    Question:  Patient needs a walker to treat with the following condition  Answer:  Status post left hip replacement   10/21/16 1621   10/21/16 1622  DME 3 n 1  Once     10/21/16 1621      Diagnostic Studies: Dg Hip Port Unilat With Pelvis 1v Left  Result Date: 10/21/2016 CLINICAL DATA:  Left hip replacement EXAM: DG HIP (WITH OR WITHOUT PELVIS) 1V PORT LEFT COMPARISON:  10/21/2016 intraoperative film FINDINGS: Two views of the left hip submitted. There is left hip prosthesis with anatomic alignment. IMPRESSION: Left hip prosthesis with anatomic alignment. Electronically Signed   By: Lahoma Crocker M.D.   On: 10/21/2016 14:47   Dg Hip Operative Unilat With Pelvis Left  Result Date: 10/21/2016 CLINICAL DATA:  Left total hip arthroplasty. EXAM: OPERATIVE LEFT HIP (WITH PELVIS IF PERFORMED) TECHNIQUE: Fluoroscopic spot image(s) were submitted for interpretation post-operatively. COMPARISON:  None. FINDINGS: Two frontal fluoroscopic images of the left hip demonstrate placement of 3 component left hip arthroplasty with long stem femoral component. The orthopedic hardware is normally aligned. There is no evidence of fractures. IMPRESSION: Intraoperative radiograph from left hip total arthroplasty without evidence of immediate complications. Electronically Signed   By: Fidela Salisbury M.D.   On: 10/21/2016 14:01   Xr Pelvis 1-2 Views  Result Date: 10/03/2016 There were actually no new x-rays today. These x-rays are from September that I reviewed. She has primary osteoarthritis and degenerative joint disease of her left hip. They're sclerotic changes on both the femoral head and acetabulum. She also has significant joint space narrowing as well.   Disposition: 01-Home or Self Care  Discharge Instructions    Call MD / Call 911    Complete by:  As directed    If you  experience chest pain or shortness of breath, CALL 911 and be transported to the hospital emergency room.  If you develope a fever above 101 F, pus (white drainage) or increased drainage or redness at the wound, or calf pain, call your surgeon's office.   Constipation Prevention    Complete by:  As directed    Drink plenty of fluids.  Prune juice may be helpful.  You may use a stool softener, such as Colace (over the counter) 100 mg twice a day.  Use MiraLax (over the counter) for constipation as needed.   Diet - low sodium heart healthy    Complete by:  As directed    Discharge patient  Complete by:  As directed    Increase activity slowly as tolerated    Complete by:  As directed       Follow-up Information    Mcarthur Rossetti, MD Follow up in 2 week(s).   Specialty:  Orthopedic Surgery Contact information: [address] [place] [address] [phone]            Signed: Mcarthur Rossetti 10/23/2016, 9:23 AM

## 2016-10-23 NOTE — Progress Notes (Signed)
Physical Therapy Treatment Patient Details Name: Natalie Bond MRN: [id number] DOB: 25-Feb-1956 Today's Date: 10/23/2016    History of Present Illness s/p LEFT TOTAL HIP ARTHROPLASTY ANTERIOR APPROACH (Left)    PT Comments    POD # 2 pm session with spouse "hands on" instruction on trasnfers, amb with AD and stairs.  Also given HEP, instructed on freq as well as use of ICE. Pt ready for D/C to home.   Follow Up Recommendations  Home health PT     Equipment Recommendations  Rolling walker with 5" wheels    Recommendations for Other Services       Precautions / Restrictions Precautions Precautions: None Restrictions Weight Bearing Restrictions: No Other Position/Activity Restrictions: WBAT    Mobility  Bed Mobility Overal bed mobility: Needs Assistance Bed Mobility: Sit to Supine;Supine to Sit     Supine to sit: Min guard;Min assist Sit to supine: Min assist;Mod assist   General bed mobility comments: OOB in recliner  Transfers Overall transfer level: Needs assistance Equipment used: Rolling walker (2 wheeled) Transfers: Sit to/from [facility] Sit to Stand: Min guard;Supervision Stand pivot transfers: Min guard;Supervision       General transfer comment: with spouse present for "hands on" instruction/demonstration  Ambulation/Gait Ambulation/Gait assistance: Supervision;Min guard Ambulation Distance (Feet): 12 Feet (to and from stairs only) Assistive device: Rolling walker (2 wheeled) Gait Pattern/deviations: Step-to pattern;Decreased stance time - left     General Gait Details: cues for sequence, posture and position from RW.  Spouse present for "hands on" instruction   Stairs Stairs: Yes Stairs assistance: Mod assist Stair Management: One rail Right;Step to pattern;Forwards Number of Stairs: 4 General stair comments: with spouse present for "hands on" instruction, proper sequencing and safety  Wheelchair Mobility    Modified  Rankin (Stroke Patients Only)       Balance                                    Cognition Arousal/Alertness: Awake/alert Behavior During Therapy: WFL for tasks assessed/performed Overall Cognitive Status: Within Functional Limits for tasks assessed                      Exercises      General Comments        Pertinent Vitals/Pain Pain Assessment: 0-10 Pain Score: 10-Worst pain ever Pain Location: L hip Pain Descriptors / Indicators: Operative site guarding;Discomfort;Grimacing;Tightness;Tender Pain Intervention(s): Limited activity within patient's tolerance;Repositioned;Monitored during session;Ice applied    Home Living                      Prior Function            PT Goals (current goals can now be found in the care plan section) Progress towards PT goals: Progressing toward goals    Frequency    7X/week      PT Plan Current plan remains appropriate    Co-evaluation             End of Session Equipment Utilized During Treatment: Gait belt Activity Tolerance: Patient limited by pain Patient left: in bed;with call bell/phone within reach     Time: 1132-1157 PT Time Calculation (min) (ACUTE ONLY): 25 min  Charges:  $Gait Training: 8-22 mins $Therapeutic Activity: 8-22 mins  G Codes:      Rica Koyanagi  PTA [facility]  Acute  Rehab Pager      [phone]

## 2016-10-23 NOTE — Progress Notes (Signed)
Occupational Therapy Treatment Patient Details Name: Natalie Bond MRN: [id number] DOB: August 14, 1956 Today's Date: 10/23/2016    History of present illness s/p LEFT TOTAL HIP ARTHROPLASTY ANTERIOR APPROACH (Left)      Follow Up Recommendations  No OT follow up;Supervision/Assistance - 24 hour    Equipment Recommendations  3 in 1 bedside comode    Recommendations for Other Services      Precautions / Restrictions Precautions Precautions: None Restrictions Weight Bearing Restrictions: No Other Position/Activity Restrictions: WBAT       Mobility Bed Mobility Overal bed mobility: Needs Assistance Bed Mobility: Sit to Supine     Supine to sit: Supervision     General bed mobility comments: cues for sequence and use of R LE to self assist  Transfers Overall transfer level: Needs assistance Equipment used: Rolling walker (2 wheeled) Transfers: Sit to/from [facility] Sit to Stand: Supervision Stand pivot transfers: Supervision            Balance                                   ADL Overall ADL's : Needs assistance/impaired     Grooming: Wash/dry hands;Standing;Supervision/safety       Lower Body Bathing: Min guard;Sit to/from stand;Cueing for safety;Cueing for sequencing   Upper Body Dressing : Set up;Sitting   Lower Body Dressing: Minimal assistance;Sit to/from stand;Cueing for safety;Cueing for sequencing;Cueing for compensatory techniques   Toilet Transfer: Supervision/safety;Ambulation;RW;Cueing for sequencing;Cueing for safety   Toileting- Clothing Manipulation and Hygiene: Supervision/safety;Sit to/from stand;Cueing for safety;Cueing for sequencing       Functional mobility during ADLs: Supervision/safety;Rolling walker;Cueing for safety        Vision                     Perception     Praxis      Cognition   Behavior During Therapy: [facility] for tasks assessed/performed Overall Cognitive Status:  Within Functional Limits for tasks assessed                       Extremity/Trunk Assessment               Exercises     Shoulder Instructions       General Comments      Pertinent Vitals/ Pain       Pain Score: 3  Pain Location: L hip Pain Descriptors / Indicators: Aching Pain Intervention(s): Monitored during session  Home Living                                          Prior Functioning/Environment              Frequency  Min 2X/week        Progress Toward Goals  OT Goals(current goals can now be found in the care plan section)  Progress towards OT goals: Progressing toward goals     Plan Discharge plan remains appropriate    Co-evaluation                 End of Session Equipment Utilized During Treatment: Rolling walker   Activity Tolerance Patient tolerated treatment well   Patient Left with call bell/phone within reach;with family/visitor present;in chair   Nurse Communication Mobility status  Time: 1000-1029 OT Time Calculation (min): 29 min  Charges: OT General Charges $OT Visit: 1 Procedure OT Treatments $Self Care/Home Management : 23-37 mins  Jefferey Lippmann, Thereasa Parkin 10/23/2016, 12:02 PM

## 2016-10-23 NOTE — Progress Notes (Signed)
Subjective: 2 Days Post-Op Procedure(s) (LRB): LEFT TOTAL HIP ARTHROPLASTY ANTERIOR APPROACH (Left) Patient reports pain as moderate.    Objective: Vital signs in last 24 hours: Temp:  [97.3 F (36.3 C)-98.6 F (37 C)] 98.5 F (36.9 C) (11/26 [id number]) Pulse Rate:  [72-89] 89 (11/26 0611) Resp:  [16-17] 16 (11/26 0611) BP: (87-153)/(57-90) 145/81 (11/26 0611) SpO2:  [95 %-97 %] 95 % (11/26 0611)  Intake/Output from previous day: 11/25 0701 - 11/26 0700 In: 1457.5 [P.O.:1040; I.V.:417.5] Out: 1450 [Urine:1450] Intake/Output this shift: Total I/O In: 240 [P.O.:240] Out: 600 [Urine:600]   Recent Labs  10/22/16 0441  HGB 10.7*    Recent Labs  10/22/16 0441  WBC 11.3*  RBC 3.44*  HCT 31.6*  PLT 310    Recent Labs  10/22/16 0441  NA 135  K 3.6  CL 102  CO2 26  BUN 11  CREATININE 0.96  GLUCOSE 143*  CALCIUM 8.4*   No results for input(s): LABPT, INR in the last 72 hours.  Sensation intact distally Intact pulses distally Dorsiflexion/Plantar flexion intact Incision: dressing C/D/I  Assessment/Plan: 2 Days Post-Op Procedure(s) (LRB): LEFT TOTAL HIP ARTHROPLASTY ANTERIOR APPROACH (Left) Up with therapy Discharge home with home health today.  Mcarthur Rossetti 10/23/2016, 9:20 AM

## 2016-10-23 NOTE — Care Management Note (Signed)
Case Management Note  Patient Details  Name: Natalie Bond MRN: [id number] Date of Birth: 1956/06/09  Subjective/Objective:   S/p Left THA                 Action/Plan: Discharge Planning: AVS reviewed: NCM spoke to pt and husband at home to assist with care. Will need RW, cane, and 3n1 bedside commode. Will pay for bedside commode out of pocket. Contacted [facility] DME rep for equipment for home. Pt preoperatively arranged with [facility] for [place].     Expected Discharge Date:  10/23/2016              Expected Discharge Plan:  [place]  In-House Referral:  NA  Discharge planning Services  CM Consult  Post Acute Care Choice:  Home Health Choice offered to:  Patient  DME Arranged:  3-N-1, Walker rolling, Cane DME Agency:  [place]:  [place]:  [facility] ([facility])  Status of Service:  Completed, signed off  If discussed at [employer], dates discussed:    Additional Comments:  Erenest Rasher, RN 10/23/2016, 11:23 AM

## 2016-10-23 NOTE — Discharge Instructions (Signed)

## 2016-10-24 ENCOUNTER — Telehealth (INDEPENDENT_AMBULATORY_CARE_PROVIDER_SITE_OTHER): Payer: Self-pay | Admitting: Orthopaedic Surgery

## 2016-10-24 ENCOUNTER — Encounter (HOSPITAL_COMMUNITY): Payer: Self-pay | Admitting: Orthopaedic Surgery

## 2016-10-24 MED ORDER — HYDROCODONE-ACETAMINOPHEN 5-325 MG PO TABS: 1.0000 | ORAL_TABLET | Freq: Four times a day (QID) | ORAL | 0 refills | Status: DC | PRN

## 2016-10-24 MED ORDER — HYDROCODONE-ACETAMINOPHEN 5-325 MG PO TABS: 1.0000 | ORAL_TABLET | ORAL | 0 refills | Status: DC | PRN

## 2016-10-24 NOTE — Telephone Encounter (Signed)
Patient aware Rx and forms ready at front desk

## 2016-10-24 NOTE — Telephone Encounter (Signed)
See below

## 2016-10-24 NOTE — Telephone Encounter (Signed)
Patient calling for 2 reasons:    1-She states the Rx Percocet is too strong...making her feel sick on her stomach. She is requesting hydrocodone.  Patient will send husband to pick up script  If available.  2-What is the status of ST Disablity form. Its been more than a week.  If husband picks up script, could the form be ready?

## 2016-10-24 NOTE — Telephone Encounter (Signed)
Can come and pick up script 

## 2016-10-25 ENCOUNTER — Telehealth (INDEPENDENT_AMBULATORY_CARE_PROVIDER_SITE_OTHER): Payer: Self-pay | Admitting: Orthopaedic Surgery

## 2016-10-25 NOTE — Telephone Encounter (Signed)
Verbal order given  

## 2016-11-03 ENCOUNTER — Ambulatory Visit (INDEPENDENT_AMBULATORY_CARE_PROVIDER_SITE_OTHER): Payer: 59 | Admitting: Orthopaedic Surgery

## 2016-11-03 DIAGNOSIS — M1612 Unilateral primary osteoarthritis, left hip: Secondary | ICD-10-CM

## 2016-11-03 MED ORDER — HYDROCODONE-ACETAMINOPHEN 5-325 MG PO TABS: 1.0000 | ORAL_TABLET | Freq: Four times a day (QID) | ORAL | 0 refills | Status: DC | PRN

## 2016-11-03 NOTE — Progress Notes (Signed)
The patient is 2 weeks status post a left total hip arthroplasty through direct anterior approach. She is admitted with a cane but only on occasion. She is doing well. She has only one more visit with home health therapy and they're releasing her. 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 She says she is doing well.  On examination incision looks great and dressing is been removed I did place new Steri-Strips there was no seroma. Her leg lengths are equal.  At this point we'll keep continue keep her out of work. We will see her back in 4 weeks to see how she is doing overall. She'll transition away from a cane as soon as she is comfortable. I did refill her hydrocodone.

## 2016-11-04 ENCOUNTER — Ambulatory Visit (INDEPENDENT_AMBULATORY_CARE_PROVIDER_SITE_OTHER): Payer: 59 | Admitting: Specialist

## 2016-12-01 ENCOUNTER — Ambulatory Visit (INDEPENDENT_AMBULATORY_CARE_PROVIDER_SITE_OTHER): Payer: 59 | Admitting: Orthopaedic Surgery

## 2016-12-01 DIAGNOSIS — Z96642 Presence of left artificial hip joint: Secondary | ICD-10-CM

## 2016-12-01 NOTE — Progress Notes (Signed)
The patient is now 6 weeks status post a left total hip arthroplasty through direct injury approach. She is doing great. She feels like she is ready go back to full work duties on February 8. She has minimal numbness over incision she states and minimal pain.  She is annular without an assistive device and no limp. She has full active and passive motion of her hip with no problems at all. Her leg lengths are equal.  At this point I did give her noted relation for duty starting February 8. We'll see her back in 4 weeks for final follow-up visit. She looks good at that point we'll probably see her back at 6 months to 1 year postop. I did refill her hydrocodone today as well.

## 2016-12-14 ENCOUNTER — Ambulatory Visit (INDEPENDENT_AMBULATORY_CARE_PROVIDER_SITE_OTHER): Payer: 59 | Admitting: Specialist

## 2016-12-29 ENCOUNTER — Ambulatory Visit (INDEPENDENT_AMBULATORY_CARE_PROVIDER_SITE_OTHER): Payer: 59 | Admitting: Orthopaedic Surgery

## 2016-12-29 DIAGNOSIS — Z96642 Presence of left artificial hip joint: Secondary | ICD-10-CM

## 2016-12-29 NOTE — Progress Notes (Signed)
The patient is now just over 2 months status post a left total hip arthroplasty through direct injury approach. She is going back to work next week. She has no pain at all. Should range of motion and strength are increasing daily. She's walking without any type of assistive device. She's had some occasional right hip pain.  On examination she was be easily move her hip the range of motion on the left side. She is neurovascular intact. Her leg lengths are equal.  At this point she'll increase her activities as he tolerates. She can return to work next week. I'll see her back in 6 months with just a low AP pelvis.

## 2017-03-23 ENCOUNTER — Other Ambulatory Visit (INDEPENDENT_AMBULATORY_CARE_PROVIDER_SITE_OTHER): Payer: Self-pay | Admitting: Orthopaedic Surgery

## 2017-03-23 NOTE — Telephone Encounter (Signed)
Please advise 

## 2017-03-23 NOTE — Telephone Encounter (Signed)
Not my patient anymore.  Natalie Bond did her hip replacement

## 2017-03-30 ENCOUNTER — Other Ambulatory Visit (INDEPENDENT_AMBULATORY_CARE_PROVIDER_SITE_OTHER): Payer: Self-pay | Admitting: Orthopaedic Surgery

## 2017-03-31 ENCOUNTER — Encounter (INDEPENDENT_AMBULATORY_CARE_PROVIDER_SITE_OTHER): Payer: Self-pay | Admitting: Orthopedic Surgery

## 2017-03-31 ENCOUNTER — Ambulatory Visit (INDEPENDENT_AMBULATORY_CARE_PROVIDER_SITE_OTHER): Payer: 59

## 2017-03-31 ENCOUNTER — Ambulatory Visit (INDEPENDENT_AMBULATORY_CARE_PROVIDER_SITE_OTHER): Payer: 59 | Admitting: Orthopedic Surgery

## 2017-03-31 DIAGNOSIS — M79671 Pain in right foot: Secondary | ICD-10-CM | POA: Diagnosis not present

## 2017-03-31 NOTE — Progress Notes (Signed)
Office Visit Note   [name]           Date of Birth: 18-Jun-1956           MRN: [id number] Visit Date: 03/31/2017              Requested by: Leanna Battles, MD [address] [place], [place] [address] PCP: Donnajean Lopes, MD  Chief Complaint  Patient presents with  . Right Foot - Pain      HPI: Patient presents complaining of increasing pain and swelling right foot second toe. Patient denies any history of gout she states her husband has gout. She has not had a diagnosis of an inflammatory arthropathy she complains of swelling and pain in the second toe with recent blunt trauma.  Assessment & Plan: Visit Diagnoses:  1. Pain in right foot     Plan: We will draw uric acid and a rheumatologic screen. With the swelling of the second toe without redness or cellulitis or ulcer this is either a inflammatory arthropathy possibly psoriatic arthritis or gout.  Follow-Up Instructions: Return in about 4 weeks (around 04/28/2017).   Ortho Exam  Patient is alert, oriented, no adenopathy, well-dressed, normal affect, normal respiratory effort. Examination patient has good pulses she has good ankle and subtalar motion she has pain to palpation beneath the MTP joint of the second toe the web spaces are nontender no evidence of a Morton's neuroma. There is sausage digit swelling of the second toe but no redness. The toe is tender to palpation. Patient does have a dermatologic rash on her foot which could be fungal rash she does have onychomycosis of the nails or could be a psoriatic rash.  Imaging: Xr Foot Complete Right  Result Date: 03/31/2017 Three-view radiographs of the right foot shows no evidence of a fracture. Patient does have some periarticular cystic changes that may be consistent with gout or a inflammatory arthropathy.   Labs: No results found for: HGBA1C, ESRSEDRATE, CRP, LABURIC, REPTSTATUS, GRAMSTAIN, CULT, LABORGA  Orders:  Orders Placed This Encounter    Procedures  . XR Foot Complete Right   No orders of the defined types were placed in this encounter.    Procedures: No procedures performed  Clinical Data: No additional findings.  ROS:  All other systems negative, except as noted in the HPI. Review of Systems  Objective: Vital Signs: There were no vitals taken for this visit.  Specialty Comments:  No specialty comments available.  PMFS History: Patient Active Problem List   Diagnosis Date Noted  . Status post left hip replacement 10/21/2016  . Unilateral primary osteoarthritis, left hip 09/23/2016    Class: Chronic  . Chronic back pain 01/14/2016  . Migraine 01/14/2016  . Essential hypertension 01/22/2015   Past Medical History:  Diagnosis Date  . Arthritis   . Cancer ([place])    skin cancer  . Complication of anesthesia   . GERD (gastroesophageal reflux disease)   . Headache   . High cholesterol   . History of bronchitis   . History of kidney stones   . Hypertension   . PONV (postoperative nausea and vomiting)   . Shortness of breath dyspnea   . Sleep apnea    pt. does not use a CPAP or BiPAP  . Stroke ([place])    Pt. states possible mini stroke in 2014  . Tuberculosis     Family History  Problem Relation Age of Onset  . Cancer Mother   . Diabetes  Mother   . Heart disease Mother   . Hyperlipidemia Mother   . Hypertension Mother   . Cancer Father   . Diabetes Father   . Heart disease Father   . Hyperlipidemia Father   . Hypertension Father   . Varicose Veins Father   . Cancer Sister   . Heart disease Sister   . Hyperlipidemia Sister   . Cancer Brother   . Diabetes Brother   . Hyperlipidemia Brother     Past Surgical History:  Procedure Laterality Date  . ABDOMINAL HYSTERECTOMY    . BACK SURGERY    . CARDIAC CATHETERIZATION     01/27/15 [facility]): EF 60%, normal coronaries.  Marland Kitchen CATARACT EXTRACTION, BILATERAL    . CHOLECYSTECTOMY  07/2016   done [place]  . EYE SURGERY    . HERNIA REPAIR  Left 1990   left groin   . LOWER EXTREMITY ANGIOGRAM Left 06/08/2015   Procedure: Ascending Venogram  Iliac Vein ;  Surgeon: Elam Dutch, MD;  Location: [facility] OR;  Service: Vascular;  Laterality: Left;  ;  TARA- [facility] SCIENTIFIC / ZACH-VOL[place]O HAVE BEEN NOTIFIED/ CONFIRMED  . PARTIAL HYSTERECTOMY    . SHOULDER SURGERY    . TOTAL HIP ARTHROPLASTY Left 10/21/2016   Procedure: LEFT TOTAL HIP ARTHROPLASTY ANTERIOR APPROACH;  Surgeon: Mcarthur Rossetti, MD;  Location: [facility] ORS;  Service: Orthopedics;  Laterality: Left;  . WRIST SURGERY Bilateral    carpal tunnel   Social History   Occupational History  . [employer]   Social History Main Topics  . Smoking status: Never Smoker  . Smokeless tobacco: Never Used  . Alcohol use No  . Drug use: No  . Sexual activity: Not on file

## 2017-03-31 NOTE — Telephone Encounter (Signed)
Deny.  She's a blackman patient

## 2017-03-31 NOTE — Addendum Note (Signed)
Addended by: Maxcine Ham on: 03/31/2017 12:33 PM   Modules accepted: Orders

## 2017-04-01 LAB — SEDIMENTATION RATE: SED RATE: 4 mm/h (ref 0–30)

## 2017-04-05 ENCOUNTER — Telehealth (INDEPENDENT_AMBULATORY_CARE_PROVIDER_SITE_OTHER): Payer: Self-pay

## 2017-04-05 ENCOUNTER — Telehealth (INDEPENDENT_AMBULATORY_CARE_PROVIDER_SITE_OTHER): Payer: Self-pay | Admitting: Orthopedic Surgery

## 2017-04-05 NOTE — Telephone Encounter (Signed)
Patient called in requesting another injection for her back pain. States it is left sided and left leg is swollen. Had sympathetic block 06/06/16. Ok to repeat?

## 2017-04-05 NOTE — Telephone Encounter (Signed)
PT CALLED AND WANTS TO KNOW ABOUT HER BLOOD WORK WE SENT HER FOR REGARDING GOUT PLEASE.  [phone]

## 2017-04-06 NOTE — Telephone Encounter (Signed)
Ok if that helped andno new trauma etc, if things feel different then OV

## 2017-04-06 NOTE — Telephone Encounter (Signed)
Called pt and scheduled her for 04/14/17 @ 8:15 w/driver

## 2017-04-06 NOTE — Telephone Encounter (Signed)
Patients lab work is still processing. I have called [facility] this morning. They are still processing. Hopefully we will have back tomorrow. I advised patient I will call her as soon as I have these results.

## 2017-04-07 LAB — COMPREHENSIVE METABOLIC PANEL
ALBUMIN: 4.1 g/dL (ref 3.6–5.1)
ALK PHOS: 66 U/L (ref 33–130)
ALT: 15 U/L (ref 6–29)
AST: 25 U/L (ref 10–35)
BUN: 16 mg/dL (ref 7–25)
CALCIUM: 9.7 mg/dL (ref 8.6–10.4)
CO2: 29 mmol/L (ref 20–31)
Chloride: 103 mmol/L (ref 98–110)
Creat: 1 mg/dL (ref 0.50–1.05)
Glucose, Bld: 106 mg/dL — ABNORMAL HIGH (ref 65–99)
POTASSIUM: 4.4 mmol/L (ref 3.5–5.3)
Sodium: 139 mmol/L (ref 135–146)
Total Bilirubin: 0.2 mg/dL (ref 0.2–1.2)
Total Protein: 7 g/dL (ref 6.1–8.1)

## 2017-04-07 LAB — URIC ACID: URIC ACID, SERUM: 6.2 mg/dL (ref 2.5–7.0)

## 2017-04-07 LAB — CYCLIC CITRUL PEPTIDE ANTIBODY, IGG: Cyclic Citrullin Peptide Ab: <16 Units

## 2017-04-07 LAB — ANA: Anti Nuclear Antibody(ANA): NEGATIVE

## 2017-04-07 NOTE — Telephone Encounter (Signed)
Check again this morning. Some labs including rheum factor are still pending. Uric acid was within normal limits. Will check again throughout the day for updated results. Will send for MD to review and than I can give patient a call about treatment plan.

## 2017-04-07 NOTE — Telephone Encounter (Signed)
I left voicemail to advise patient uric acid was normal 6.2 and she does not have gout. Advised that other rheumatologic labs are still pending. Advised this takes several days to process. And we will call her once her other labs return and Dr. Sharol Given reviews them.

## 2017-04-10 LAB — RHEUMATOID FACTOR

## 2017-04-10 LAB — C-REACTIVE PROTEIN: CRP: 11.8 mg/L — AB (ref <8.0)

## 2017-04-10 NOTE — Telephone Encounter (Signed)
I have called and spoken with the patient advising her to please come in the office this week. He is concerned about infection due to increased CRP. Other labs were within normal limits. She will come in tomorrow at 4:00pm.

## 2017-04-11 ENCOUNTER — Encounter (INDEPENDENT_AMBULATORY_CARE_PROVIDER_SITE_OTHER): Payer: Self-pay | Admitting: Orthopedic Surgery

## 2017-04-11 ENCOUNTER — Ambulatory Visit (INDEPENDENT_AMBULATORY_CARE_PROVIDER_SITE_OTHER): Payer: 59 | Admitting: Orthopedic Surgery

## 2017-04-11 DIAGNOSIS — M79671 Pain in right foot: Secondary | ICD-10-CM | POA: Insufficient documentation

## 2017-04-11 DIAGNOSIS — M009 Pyogenic arthritis, unspecified: Secondary | ICD-10-CM | POA: Diagnosis not present

## 2017-04-11 MED ORDER — MELOXICAM 7.5 MG PO TABS: 7.5000 mg | ORAL_TABLET | Freq: Every day | ORAL | 1 refills | Status: DC

## 2017-04-11 NOTE — Progress Notes (Signed)
Office Visit Note   [name]           Date of Birth: 05-03-1956           MRN: [id number] Visit Date: 04/11/2017              Requested by: Leanna Battles, MD [address] [place], [place] [address] PCP: Leanna Battles, MD  No chief complaint on file.     HPI: Patient presents in follow-up for her right foot. She still has pain worse at the second toe MTP joint of the right foot. She is has swelling on her second toe she states she is on her feet all day long and her pain is worse at the end of the day. She states she has tried meloxicam and this has helped for the arthritis of her hips and generalized upper extremity arthritis. Patient's rheumatoid screen was negative for inflammatory mediators such as rheumatoid factor and ANA. Her C-reactive protein was elevated 11.8. Her glucose was 106.  Assessment & Plan: Visit Diagnoses:  1. Pain in right foot   2. Inflammation of interphalangeal joint of toe due to infection, right ([place])     Plan: We'll obtain an MRI scan to rule out inflammatory arthritis such as psoriatic arthritis of the second toe MTP joint versus osteomyelitis. Follow-up after the MRI scan is obtained. A prescription was called in for meloxicam and a prescription for an MRI scan.  Follow-Up Instructions: Return in about 2 weeks (around 04/25/2017).   Ortho Exam  Patient is alert, oriented, no adenopathy, well-dressed, normal affect, normal respiratory effort. Examination patient has good pulses. She does have swelling of both lower extremities with venous insufficiency but she has no venous stasis ulcers. She has a good dorsalis pedis pulse. Patient has sausage digit swelling of the second toe but there is no redness no cellulitis there are no open ulcers. She is maximally tender to palpation of the MTP joint of the second toe right foot. She has a prominent second metatarsal head plantarly with subluxation with clawing of the lesser toes. Clinically  there is no signs of infection.  Imaging: No results found.  Labs: Lab Results  Component Value Date   ESRSEDRATE 4 03/31/2017   CRP 11.8 (H) 03/31/2017   LABURIC 6.2 03/31/2017    Orders:  Orders Placed This Encounter  Procedures  . MR Foot Right w/o contrast   Meds ordered this encounter  Medications  . meloxicam (MOBIC) 7.5 MG tablet    Sig: Take 1 tablet (7.5 mg total) by mouth daily.    Dispense:  30 tablet    Refill:  1     Procedures: No procedures performed  Clinical Data: No additional findings.  ROS:  All other systems negative, except as noted in the HPI. Review of Systems  Objective: Vital Signs: There were no vitals taken for this visit.  Specialty Comments:  No specialty comments available.  PMFS History: Patient Active Problem List   Diagnosis Date Noted  . Pain in right foot 04/11/2017  . Status post left hip replacement 10/21/2016  . Unilateral primary osteoarthritis, left hip 09/23/2016    Class: Chronic  . Chronic back pain 01/14/2016  . Migraine 01/14/2016  . Essential hypertension 01/22/2015   Past Medical History:  Diagnosis Date  . Arthritis   . Cancer ([place])    skin cancer  . Complication of anesthesia   . GERD (gastroesophageal reflux disease)   . Headache   .  High cholesterol   . History of bronchitis   . History of kidney stones   . Hypertension   . PONV (postoperative nausea and vomiting)   . Shortness of breath dyspnea   . Sleep apnea    pt. does not use a CPAP or BiPAP  . Stroke ([place])    Pt. states possible mini stroke in 2014  . Tuberculosis     Family History  Problem Relation Age of Onset  . Cancer Mother   . Diabetes Mother   . Heart disease Mother   . Hyperlipidemia Mother   . Hypertension Mother   . Cancer Father   . Diabetes Father   . Heart disease Father   . Hyperlipidemia Father   . Hypertension Father   . Varicose Veins Father   . Cancer Sister   . Heart disease Sister   . Hyperlipidemia  Sister   . Cancer Brother   . Diabetes Brother   . Hyperlipidemia Brother     Past Surgical History:  Procedure Laterality Date  . ABDOMINAL HYSTERECTOMY    . BACK SURGERY    . CARDIAC CATHETERIZATION     01/27/15 [facility]): EF 60%, normal coronaries.  Marland Kitchen CATARACT EXTRACTION, BILATERAL    . CHOLECYSTECTOMY  07/2016   done [place]  . EYE SURGERY    . HERNIA REPAIR Left 1990   left groin   . LOWER EXTREMITY ANGIOGRAM Left 06/08/2015   Procedure: Ascending Venogram  Iliac Vein ;  Surgeon: Elam Dutch, MD;  Location: [facility] OR;  Service: Vascular;  Laterality: Left;  ;  TARA- [facility] SCIENTIFIC / ZACH-VOLCANO HAVE BEEN NOTIFIED/ CONFIRMED  . PARTIAL HYSTERECTOMY    . SHOULDER SURGERY    . TOTAL HIP ARTHROPLASTY Left 10/21/2016   Procedure: LEFT TOTAL HIP ARTHROPLASTY ANTERIOR APPROACH;  Surgeon: Mcarthur Rossetti, MD;  Location: [facility] ORS;  Service: Orthopedics;  Laterality: Left;  . WRIST SURGERY Bilateral    carpal tunnel   Social History   Occupational History  . [employer]   Social History Main Topics  . Smoking status: Never Smoker  . Smokeless tobacco: Never Used  . Alcohol use No  . Drug use: No  . Sexual activity: Not on file

## 2017-04-14 ENCOUNTER — Encounter (INDEPENDENT_AMBULATORY_CARE_PROVIDER_SITE_OTHER): Payer: Self-pay | Admitting: Physical Medicine and Rehabilitation

## 2017-04-14 ENCOUNTER — Ambulatory Visit (INDEPENDENT_AMBULATORY_CARE_PROVIDER_SITE_OTHER): Payer: 59 | Admitting: Physical Medicine and Rehabilitation

## 2017-04-14 ENCOUNTER — Ambulatory Visit (INDEPENDENT_AMBULATORY_CARE_PROVIDER_SITE_OTHER): Payer: 59

## 2017-04-14 VITALS — BP 152/98 | HR 57 | Temp 97.8°F

## 2017-04-14 DIAGNOSIS — M25551 Pain in right hip: Secondary | ICD-10-CM | POA: Diagnosis not present

## 2017-04-14 DIAGNOSIS — M7061 Trochanteric bursitis, right hip: Secondary | ICD-10-CM

## 2017-04-14 DIAGNOSIS — M79674 Pain in right toe(s): Secondary | ICD-10-CM

## 2017-04-14 DIAGNOSIS — M7989 Other specified soft tissue disorders: Secondary | ICD-10-CM | POA: Diagnosis not present

## 2017-04-14 NOTE — Patient Instructions (Signed)

## 2017-04-14 NOTE — Progress Notes (Signed)
Natalie Bond - 61 y.o. female MRN [id number]  Date of birth: Oct 17, 1956  Office Visit Note: Visit Date: 04/14/2017 PCP: Leanna Battles, MD Referred by: Leanna Battles, MD  Subjective: Chief Complaint  Patient presents with  . Lower Back - Pain  . Right Foot - Pain   HPI: Natalie Bond is a 61 year old female who I seen on several occasions for left low back and hip and leg pain. We have actually brought her in today for repeat injection on the left. Her history is such that she had an L5-S1 disc protrusion but on the latest MRI from a year ago this has resolved. She began having a lot of swelling in the left leg and it was felt like maybe this was some type of complex regional pain syndrome. Lumbar sympathetic blocks helped to a good degree. Says she still gets some swelling. However, she comes in today with right-sided hip pain. She has difficulty sleeping on that side. It is radiating into the thigh but not down the leg to the foot. She does however endorse right foot pain. She's been followed by Dr. Sharol Given for this. An MRI of the right foot has been ordered. She is also had a hip replacement since I've seen her. This is on the left. This is by Dr. Ninfa Linden. All of her pain seems to return when she returns to work. She denies any numbness and paresthesias on the right. She denies any new trauma on the right. Prior MRI showed facet joint disease at L4-5 and some disc desiccation at L5-S1. There were no specific areas of nerve compression. Her symptoms are worse again with laying on the right side and ambulating. She gets some relief with change of position. Medications are helping.    Review of Systems  Musculoskeletal: Positive for back pain and joint pain.   Otherwise per HPI.  Assessment & Plan: Visit Diagnoses:  1. Pain in right hip   2. Greater trochanteric bursitis of right hip   3. Pain and swelling of toe of right foot     Plan: Findings:  Chronic history of multiple orthopedic  complaints and chronic pain syndrome. History of possible complex regional pain syndrome on the left. She now is complaining of right-sided hip and thigh pain. This is something we had not previously seen her for. She is tender exquisitely over the greater trochanter on the right. She has no groin pain with internal rotation. She has good distal strength bilaterally. There is some swelling on the left ankle. Right foot was really not examined as is been followed by Dr. Sharol Given. We will go ahead today and complete a diagnostic and hopefully therapeutic right greater trochanteric injection. This would be diagnostic of a therapeutic.    Meds & Orders: No orders of the defined types were placed in this encounter.   Orders Placed This Encounter  Procedures  . Large Joint Injection/Arthrocentesis  . XR C-ARM NO REPORT    Follow-up: Return if symptoms worsen or fail to improve, for Also follow-up with Dr. Sharol Given for right foot.   Procedures: Greater trochanteric bursa injection fluoroscopic guidance Date/Time: 04/14/2017 8:44 AM Performed by: Magnus Sinning Authorized by: Magnus Sinning   Consent Given by:  Parent Site marked: the procedure site was marked   Timeout: prior to procedure the correct patient, procedure, and site was verified   Indications:  Pain and diagnostic evaluation Location:  Hip Prep: patient was prepped and draped in usual sterile fashion   Needle  Size:  22 G Needle Length:  3.5 inches Approach:  Lateral Ultrasound Guidance: No   Fluoroscopic Guidance: No   Arthrogram: No   Medications:  4 mL bupivacaine 0.25 %; 4 mL lidocaine 2 %; 80 mg triamcinolone acetonide 40 MG/ML Aspiration Attempted: No   Patient tolerance:  Patient tolerated the procedure well with no immediate complications  Greatest area of pain over the greater trochanter was palpated and marked prior to injection. The patient did seem to have relief after the injection.     No notes on file   Clinical  History: L3-4: Diffuse annular bulge and moderate facet disease with mild bilateral lateral recess encroachment but no spinal or foraminal stenosis. There is a small synovial cyst on the right side which is new but no significant mass effect.  L4-5: Mild stable annular bulge and moderate facet disease contributing to mild lateral recess encroachment bilaterally. Possible irritation of the L5 nerve roots, left greater than right. No focal disc protrusion, significant spinal or foraminal stenosis. This level appears relatively stable.  L5-S1: Stable disc disease and facet disease and remote postsurgical changes. Mild desiccation and retraction of the central disc protrusion. No direct mass effect on the S1 nerve roots. The exiting L5 nerve roots appear normal.  IMPRESSION: Overall stable MR examination of the lumbar spine. No significant change since 05/16/2014.  Stable shallow broad-based foraminal and extra foraminal disc protrusion on the left at L2-3 with possible mild impingement on the left L3 nerve root in the lateral recess. No definite mass effect on the exiting left L2 nerve root.  Stable mild bilateral lateral recess encroachment at L3-4. There is a new small synovial cyst on the right side but no mass effect.  Stable multifactorial mild lateral recess encroachment bilaterally at L4-5. Possible irritation of the left L5 nerve roots, left greater than right.  Stable disc disease and facet disease at L5-S1. Mild desiccation and retraction of the central disc protrusion.   Electronically Signed   By: Marijo Sanes M.D.   On: 12/19/2015 10:37  She reports that she has never smoked. She has never used smokeless tobacco.   Recent Labs  03/31/17 1228  LABURIC 6.2    Objective:  VS:  HT:    WT:   BMI:     BP:(!) 152/98  HR:(!) 57bpm  TEMP:97.8 F (36.6 C)(Oral)  RESP:99 % Physical Exam  Constitutional: She is oriented to person, place, and time. She  appears well-developed and well-nourished.  Eyes: Conjunctivae and EOM are normal. Pupils are equal, round, and reactive to light.  Cardiovascular: Normal rate and intact distal pulses.   Pulmonary/Chest: Effort normal.  Musculoskeletal:  Patient ambulates without aid. She does have more edema in the left lower extremity than the right. She has good distal strength bilaterally. There is a swelling or cyanosis digit of the right second toe. There is some pain in the metatarsal area. She is exquisitely tender over the right greater trochanter and this reproduces most of her pain. She has no pain with hip rotation.  Neurological: She is alert and oriented to person, place, and time. She exhibits normal muscle tone.  Skin: Skin is warm and dry. No rash noted. No erythema.  Psychiatric: She has a normal mood and affect. Her behavior is normal.  Nursing note and vitals reviewed.   Ortho Exam Imaging: No results found.  Past Medical/Family/Surgical/Social History: Medications & Allergies reviewed per EMR Patient Active Problem List   Diagnosis Date Noted  .  Pain in right foot 04/11/2017  . Status post left hip replacement 10/21/2016  . Unilateral primary osteoarthritis, left hip 09/23/2016    Class: Chronic  . Chronic back pain 01/14/2016  . Migraine 01/14/2016  . Essential hypertension 01/22/2015   Past Medical History:  Diagnosis Date  . Arthritis   . Cancer ([place])    skin cancer  . Complication of anesthesia   . GERD (gastroesophageal reflux disease)   . Headache   . High cholesterol   . History of bronchitis   . History of kidney stones   . Hypertension   . PONV (postoperative nausea and vomiting)   . Shortness of breath dyspnea   . Sleep apnea    pt. does not use a CPAP or BiPAP  . Stroke ([place])    Pt. states possible mini stroke in 2014  . Tuberculosis    Family History  Problem Relation Age of Onset  . Cancer Mother   . Diabetes Mother   . Heart disease Mother   .  Hyperlipidemia Mother   . Hypertension Mother   . Cancer Father   . Diabetes Father   . Heart disease Father   . Hyperlipidemia Father   . Hypertension Father   . Varicose Veins Father   . Cancer Sister   . Heart disease Sister   . Hyperlipidemia Sister   . Cancer Brother   . Diabetes Brother   . Hyperlipidemia Brother    Past Surgical History:  Procedure Laterality Date  . ABDOMINAL HYSTERECTOMY    . BACK SURGERY    . CARDIAC CATHETERIZATION     01/27/15 [facility]): EF 60%, normal coronaries.  Marland Kitchen CATARACT EXTRACTION, BILATERAL    . CHOLECYSTECTOMY  07/2016   done [place]  . EYE SURGERY    . HERNIA REPAIR Left 1990   left groin   . LOWER EXTREMITY ANGIOGRAM Left 06/08/2015   Procedure: Ascending Venogram  Iliac Vein ;  Surgeon: Elam Dutch, MD;  Location: [facility] OR;  Service: Vascular;  Laterality: Left;  ;  TARA- [facility] SCIENTIFIC / ZACH-VOL[place] HAVE BEEN NOTIFIED/ CONFIRMED  . PARTIAL HYSTERECTOMY    . SHOULDER SURGERY    . TOTAL HIP ARTHROPLASTY Left 10/21/2016   Procedure: LEFT TOTAL HIP ARTHROPLASTY ANTERIOR APPROACH;  Surgeon: Mcarthur Rossetti, MD;  Location: [facility] ORS;  Service: Orthopedics;  Laterality: Left;  . WRIST SURGERY Bilateral    carpal tunnel   Social History   Occupational History  . [employer]   Social History Main Topics  . Smoking status: Never Smoker  . Smokeless tobacco: Never Used  . Alcohol use No  . Drug use: No  . Sexual activity: Not on file

## 2017-04-14 NOTE — Progress Notes (Deleted)
Right foot and leg pain for several months. Started after she went back to work after hip replacement.

## 2017-04-18 MED ORDER — TRIAMCINOLONE ACETONIDE 40 MG/ML IJ SUSP
80.0000 mg | INTRAMUSCULAR | Status: AC | PRN
  Administered 2017-04-14: 80 mg via INTRA_ARTICULAR

## 2017-04-18 MED ORDER — LIDOCAINE HCL 2 % IJ SOLN
4.0000 mL | INTRAMUSCULAR | Status: AC | PRN
  Administered 2017-04-14: 4 mL

## 2017-04-18 MED ORDER — BUPIVACAINE HCL 0.25 % IJ SOLN
4.0000 mL | INTRAMUSCULAR | Status: AC | PRN
  Administered 2017-04-14: 4 mL via INTRA_ARTICULAR

## 2017-04-19 ENCOUNTER — Telehealth (INDEPENDENT_AMBULATORY_CARE_PROVIDER_SITE_OTHER): Payer: Self-pay

## 2017-04-25 ENCOUNTER — Ambulatory Visit
Admission: RE | Admit: 2017-04-25 | Discharge: 2017-04-25 | Disposition: A | Discharge status: Home / Self Care | Payer: 59 | Source: Ambulatory Visit | Attending: Orthopedic Surgery | Admitting: Orthopedic Surgery

## 2017-04-25 DIAGNOSIS — M79671 Pain in right foot: Secondary | ICD-10-CM

## 2017-04-27 ENCOUNTER — Ambulatory Visit (INDEPENDENT_AMBULATORY_CARE_PROVIDER_SITE_OTHER): Payer: 59 | Admitting: Orthopedic Surgery

## 2017-04-27 DIAGNOSIS — M6701 Short Achilles tendon (acquired), right ankle: Secondary | ICD-10-CM

## 2017-04-27 DIAGNOSIS — M205X1 Other deformities of toe(s) (acquired), right foot: Secondary | ICD-10-CM | POA: Diagnosis not present

## 2017-04-27 NOTE — Progress Notes (Signed)
Office Visit Note   [name]           Date of Birth: [birth year]           MRN: [id number] Visit Date: 04/27/2017              Requested by: Leanna Battles, MD [address] [place], [place] [address] PCP: Leanna Battles, MD  No chief complaint on file.     HPI: Patient presents status post MRI scan of her right foot. She states she still has pain second toe MTP joint as well as swelling of the second toe. She has pain with shoewear pain with walking. She denies any redness or cellulitis.  Assessment & Plan: Visit Diagnoses:  1. Claw toe, acquired, right   2. Achilles tendon contracture, right     Plan: Recommended heel cord stretching this was demonstrated to her recommended over-the-counter orthotics such as sole orthotics. Follow up as needed. Discussed with her her Achilles tightness does not resolve with stretching and the second MTP joint pain does not resolve with orthotics we could consider a Weil osteotomy for the second metatarsal and a gastrocnemius recession.  Follow-Up Instructions: Return if symptoms worsen or fail to improve.   Ortho Exam  Patient is alert, oriented, no adenopathy, well-dressed, normal affect, normal respiratory effort. Examination she has a normal gait. She has good pulses there is no venous stasis swelling there is swelling of the second toe she has maximal tenderness to palpation of plantar aspect of the MTP joint second toe there is slight clawing of the second toe with some tenderness at the PIP joint. Review of the MRI scan shows some synovial thickening around the second MTP joint there is no destructive bony changes no signs of osteomyelitis.  Imaging: No results found.  Labs: Lab Results  Component Value Date   ESRSEDRATE 4 03/31/2017   CRP 11.8 (H) 03/31/2017   LABURIC 6.2 03/31/2017    Orders:  No orders of the defined types were placed in this encounter.  No orders of the defined types were placed in this  encounter.    Procedures: No procedures performed  Clinical Data: No additional findings.  ROS:  All other systems negative, except as noted in the HPI. Review of Systems  Objective: Vital Signs: There were no vitals taken for this visit.  Specialty Comments:  No specialty comments available.  PMFS History: Patient Active Problem List   Diagnosis Date Noted  . Claw toe, acquired, right 04/27/2017  . Achilles tendon contracture, right 04/27/2017  . Pain in right foot 04/11/2017  . Status post left hip replacement 10/21/2016  . Unilateral primary osteoarthritis, left hip 09/23/2016    Class: Chronic  . Chronic back pain 01/14/2016  . Migraine 01/14/2016  . Essential hypertension 01/22/2015   Past Medical History:  Diagnosis Date  . Arthritis   . Cancer ([place])    skin cancer  . Complication of anesthesia   . GERD (gastroesophageal reflux disease)   . Headache   . High cholesterol   . History of bronchitis   . History of kidney stones   . Hypertension   . PONV (postoperative nausea and vomiting)   . Shortness of breath dyspnea   . Sleep apnea    pt. does not use a CPAP or BiPAP  . Stroke ([place])    Pt. states possible mini stroke in 2014  . Tuberculosis     Family History  Problem Relation Age of Onset  .  Cancer Mother   . Diabetes Mother   . Heart disease Mother   . Hyperlipidemia Mother   . Hypertension Mother   . Cancer Father   . Diabetes Father   . Heart disease Father   . Hyperlipidemia Father   . Hypertension Father   . Varicose Veins Father   . Cancer Sister   . Heart disease Sister   . Hyperlipidemia Sister   . Cancer Brother   . Diabetes Brother   . Hyperlipidemia Brother     Past Surgical History:  Procedure Laterality Date  . ABDOMINAL HYSTERECTOMY    . BACK SURGERY    . CARDIAC CATHETERIZATION     01/27/15 [facility]): EF 60%, normal coronaries.  Marland Kitchen CATARACT EXTRACTION, BILATERAL    . CHOLECYSTECTOMY  07/2016   done [place]  . EYE  SURGERY    . HERNIA REPAIR Left 1990   left groin   . LOWER EXTREMITY ANGIOGRAM Left 06/08/2015   Procedure: Ascending Venogram  Iliac Vein ;  Surgeon: Elam Dutch, MD;  Location: [facility] OR;  Service: Vascular;  Laterality: Left;  ;  TARA- [facility] / ZACH-[facility]O HAVE BEEN NOTIFIED/ CONFIRMED  . PARTIAL HYSTERECTOMY    . SHOULDER SURGERY    . TOTAL HIP ARTHROPLASTY Left 10/21/2016   Procedure: LEFT TOTAL HIP ARTHROPLASTY ANTERIOR APPROACH;  Surgeon: Mcarthur Rossetti, MD;  Location: [facility] ORS;  Service: Orthopedics;  Laterality: Left;  . WRIST SURGERY Bilateral    carpal tunnel   Social History   Occupational History  . [employer]   Social History Main Topics  . Smoking status: Never Smoker  . Smokeless tobacco: Never Used  . Alcohol use No  . Drug use: No  . Sexual activity: Not on file

## 2017-04-28 ENCOUNTER — Ambulatory Visit (INDEPENDENT_AMBULATORY_CARE_PROVIDER_SITE_OTHER): Payer: 59 | Admitting: Specialist

## 2017-05-04 ENCOUNTER — Ambulatory Visit (INDEPENDENT_AMBULATORY_CARE_PROVIDER_SITE_OTHER): Payer: 59

## 2017-05-04 ENCOUNTER — Encounter (INDEPENDENT_AMBULATORY_CARE_PROVIDER_SITE_OTHER): Payer: Self-pay | Admitting: Specialist

## 2017-05-04 ENCOUNTER — Ambulatory Visit (INDEPENDENT_AMBULATORY_CARE_PROVIDER_SITE_OTHER): Payer: 59 | Admitting: Specialist

## 2017-05-04 VITALS — Ht 67.5 in | Wt 170.0 lb

## 2017-05-04 DIAGNOSIS — Z9660 Presence of unspecified orthopedic joint implant: Secondary | ICD-10-CM | POA: Diagnosis not present

## 2017-05-04 DIAGNOSIS — G8929 Other chronic pain: Secondary | ICD-10-CM

## 2017-05-04 DIAGNOSIS — M5136 Other intervertebral disc degeneration, lumbar region: Secondary | ICD-10-CM | POA: Diagnosis not present

## 2017-05-04 DIAGNOSIS — M5442 Lumbago with sciatica, left side: Secondary | ICD-10-CM

## 2017-05-04 DIAGNOSIS — M51379 Other intervertebral disc degeneration, lumbosacral region without mention of lumbar back pain or lower extremity pain: Secondary | ICD-10-CM | POA: Insufficient documentation

## 2017-05-04 DIAGNOSIS — M5137 Other intervertebral disc degeneration, lumbosacral region: Secondary | ICD-10-CM

## 2017-05-04 MED ORDER — GABAPENTIN 100 MG PO CAPS: 100.0000 mg | ORAL_CAPSULE | Freq: Every day | ORAL | 3 refills | Status: DC

## 2017-05-04 NOTE — Progress Notes (Addendum)
Office Visit Note   [name]           Date of Birth: 01-07-1956           MRN: [id number] Visit Date: 05/04/2017              Requested by: Leanna Battles, MD [address] [place], [place] [address] PCP: Leanna Battles, MD   Assessment & Plan: Visit Diagnoses:  1. Chronic left-sided low back pain with left-sided sciatica   2. Degenerative disc disease at L5-S1 level   3. 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 Previous lumbar surgery for HNP now with complaints of mechanical back pain with intermittent pain into the left leg. Underwent a left total hip replacement with good improvement in her function. Clinically no focal deficit, she has some left leg swelling as the day  goes on with left leg below the knee 1/2" greater circumference than the right, thigh circumferences are equal. MRI without recurrent HNP but finding consistent with  modic changes and degenerative disc disease L5-S1. An extra foramenal protrusion left L3 does not correlate with her pain complaints. Discussed the problems with DDD and surgical treatment not being as predictable for success. The need for clinical findings that can establish if a change had occurred with surgery  as opposed to surgical treatment of pain. The total hip replacement has been successful but surgical fusion offers only a 60-70% chance of success with a similar amount  of pain relief of 60-70%. Quite often the surgery is completed and then the patient told that they are disabled and not able to work. When she is not able to physically work is related to her pain and mechanical back dysfunction in addition to other medical illnesses. As a matter of record I do not determine if a patient is disabled from their job and when she feels incapable of performing her work she should consider an option of disablity but also needs to consider the costs  of being unemployed and loss of medical insurance and benefits from her work place. For her condition I recommend exercises that strengthen the core muscles, an endurance training program with pool or regular level ground walking. Weight loss helps as well as activity modification, she has worked at her job long enough that her senority may help with consideration of change to  a more supervisoral type position. Alternating sitting, standing and walking, avoiding heavier lifting. Fusion is the only surgical solution I can offer but the resulting relief is not as predictable as with a nerve compression syndrome or instability.   Plan: Avoid frequent bending and stooping  No lifting greater than 10 lbs. May use ice or moist heat for pain. Weight loss is of benefit. Exercises are important, walking program for building up endurance. Meloxicam for arthritis. Follow-Up Instructions: No Follow-up on file.   Orders:  Orders Placed This Encounter  Procedures  . XR Lumbar Spine 2-3 Views   No orders of the defined types were placed in this encounter.     Procedures: No procedures performed   Clinical Data: Findings:  11/2015, MRI lumbar without contrast shows a left and central prominence to the disc and impression on the ventral thecal sac L5-S1 and the left S1 nerve root, without contrast unable to tell if this is scar or disc, the sagital images suggest scar. Modic changes are seen at the L5-S1  disc.     Subjective: Chief Complaint  Patient presents with  . 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 She is concerned as she is still having back pain and some pain into the left leg. She has had injections for hip bursitis, recent left hip replacement, 10/21/2017. She is complaining of pain with working daily, taking norco at night, prescriptions from Dr. Philip Aspen since a lumbar surgery and settlement  in 2005, Dr. Magdalene Molly, At [facility]. Was in a pain management, returning to work about 2.5 years later, with less lifting and less amounts of weight. Given a  20 % rating. Bending stooping and sitting for long lengths of time worsen the pain. Taking meloxicam, Dr. Sharol Given is seeing her for her foot. Using a linament from [employer]. She has cramping into her left leg. Standing and walking are improved post THR. 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 Wants to know if she should apply for disability and SSDI.     Review of Systems  Constitutional: Negative.   HENT: Negative.   Eyes: Negative.   Respiratory: Negative.   Cardiovascular: Negative.   Gastrointestinal: Negative.   Endocrine: Negative.   Genitourinary: Negative.   Musculoskeletal: Negative.   Skin: Negative.   Allergic/Immunologic: Negative.   Neurological: Negative.   Hematological: Negative.   Psychiatric/Behavioral: Negative.      Objective: Vital Signs: Ht 5' 7.5" (1.715 m)   Wt 170 lb (77.1 kg)   BMI 26.23 kg/m   Physical Exam  Constitutional: She is oriented to person, place, and time. She appears well-developed and well-nourished.  HENT:  Head: Normocephalic and atraumatic.  Eyes: EOM are normal. Pupils are equal, round, and reactive to light.  Neck: Normal range of motion. Neck supple.  Pulmonary/Chest: Effort normal and breath sounds normal.  Abdominal: Soft. Bowel sounds are normal.  Neurological: She is alert and oriented to person, place, and time.  Skin: Skin is warm and dry.  Psychiatric: She has a normal mood and affect. Her behavior is normal. Judgment and thought content normal.    Back Exam   Tenderness  The patient is experiencing tenderness in the lumbar.  Range of Motion  Extension: normal  Flexion:  60 abnormal  Lateral Bend Right: normal  Lateral Bend Left: normal  Rotation Right: normal  Rotation Left: normal   Muscle Strength  Right Quadriceps:   5/5  Left Quadriceps:  5/5  Right Hamstrings:  5/5  Left Hamstrings:  5/5   Tests  Straight leg raise right: negative Straight leg raise left: negative  Reflexes  Patellar: Hyporeflexic Achilles: Hyporeflexic Biceps: normal Babinski's sign: normal   Other  Toe Walk: abnormal Heel Walk: normal Sensation: normal Gait: normal  Erythema: no back redness Scars: present      Specialty Comments:  No specialty comments available.  Imaging: No results found.   PMFS History: Patient Active Problem List   Diagnosis Date Noted  . Unilateral primary osteoarthritis, left hip 09/23/2016    Priority: High    Class: Chronic  . Degenerative disc disease at L5-S1 level 05/04/2017  . History of joint replacement 05/04/2017  . Claw toe, acquired, right 04/27/2017  . Achilles tendon contracture, right 04/27/2017  . Pain in right foot 04/11/2017  . Status post left hip replacement 10/21/2016  . Chronic back pain 01/14/2016  . Migraine 01/14/2016  . Essential hypertension 01/22/2015  Past Medical History:  Diagnosis Date  . Arthritis   . Cancer ([place])    skin cancer  . Complication of anesthesia   . GERD (gastroesophageal reflux disease)   . Headache   . High cholesterol   . History of bronchitis   . History of kidney stones   . Hypertension   . PONV (postoperative nausea and vomiting)   . Shortness of breath dyspnea   . Sleep apnea    pt. does not use a CPAP or BiPAP  . Stroke ([place])    Pt. states possible mini stroke in 2014  . Tuberculosis     Family History  Problem Relation Age of Onset  . Cancer Mother   . Diabetes Mother   . Heart disease Mother   . Hyperlipidemia Mother   . Hypertension Mother   . Cancer Father   . Diabetes Father   . Heart disease Father   . Hyperlipidemia Father   . Hypertension Father   . Varicose Veins Father   . Cancer Sister   . Heart disease Sister   . Hyperlipidemia Sister   . Cancer Brother   . Diabetes Brother   .  Hyperlipidemia Brother     Past Surgical History:  Procedure Laterality Date  . ABDOMINAL HYSTERECTOMY    . BACK SURGERY    . CARDIAC CATHETERIZATION     01/27/15 [facility]): EF 60%, normal coronaries.  Marland Kitchen CATARACT EXTRACTION, BILATERAL    . CHOLECYSTECTOMY  07/2016   done [place]  . EYE SURGERY    . HERNIA REPAIR Left 1990   left groin   . LOWER EXTREMITY ANGIOGRAM Left 06/08/2015   Procedure: Ascending Venogram  Iliac Vein ;  Surgeon: Elam Dutch, MD;  Location: [facility] OR;  Service: Vascular;  Laterality: Left;  ;  TARA- [facility] SCIENTIFIC / ZACH-VOLCANO HAVE BEEN NOTIFIED/ CONFIRMED  . PARTIAL HYSTERECTOMY    . SHOULDER SURGERY    . TOTAL HIP ARTHROPLASTY Left 10/21/2016   Procedure: LEFT TOTAL HIP ARTHROPLASTY ANTERIOR APPROACH;  Surgeon: Mcarthur Rossetti, MD;  Location: [facility] ORS;  Service: Orthopedics;  Laterality: Left;  . WRIST SURGERY Bilateral    carpal tunnel   Social History   Occupational History  . [employer]   Social History Main Topics  . Smoking status: Never Smoker  . Smokeless tobacco: Never Used  . Alcohol use No  . Drug use: No  . Sexual activity: Not on file

## 2017-05-04 NOTE — Patient Instructions (Addendum)
Avoid frequent bending and stooping  No lifting greater than 10 lbs. May use ice or moist heat for pain. Weight loss is of benefit. Exercises are important, walking program for building up endurance. Meloxicam for arthritis.

## 2017-05-05 ENCOUNTER — Ambulatory Visit (INDEPENDENT_AMBULATORY_CARE_PROVIDER_SITE_OTHER): Payer: 59 | Admitting: Orthopedic Surgery

## 2017-06-29 ENCOUNTER — Ambulatory Visit (INDEPENDENT_AMBULATORY_CARE_PROVIDER_SITE_OTHER): Payer: 59 | Admitting: Orthopaedic Surgery

## 2017-06-29 DIAGNOSIS — Z96642 Presence of left artificial hip joint: Secondary | ICD-10-CM

## 2017-06-29 DIAGNOSIS — M7061 Trochanteric bursitis, right hip: Secondary | ICD-10-CM | POA: Diagnosis not present

## 2017-06-29 MED ORDER — DICLOFENAC SODIUM 1 % TD GEL: 2.0000 g | Freq: Four times a day (QID) | TRANSDERMAL | 3 refills | Status: DC

## 2017-06-29 NOTE — Progress Notes (Signed)
The patient is here now for follow-up of her right hip trochanteric bursitis in her left total hip arthroplasty. She is 8 months out from left total hip arthritis that is doing well but her right hip hurts not in the groin and just over trochanteric area.  On exam both hips move smoothly with no blocks to motion in the hip joint at all there is no pain in the right groin or left groin at all. Her pain is only over the right hip trochanteric area.  Her exam is consistent with trochanteric bursitis. I will send and Voltaren gel to try as well as a shoulder stretching exercises well. I'll see her back in 3 months and that'll be the 1 year standpoint from surgery and we'll have a low AP pelvis at that visit.

## 2017-07-26 ENCOUNTER — Ambulatory Visit (INDEPENDENT_AMBULATORY_CARE_PROVIDER_SITE_OTHER): Payer: 59 | Admitting: Physical Medicine and Rehabilitation

## 2017-07-26 ENCOUNTER — Encounter (INDEPENDENT_AMBULATORY_CARE_PROVIDER_SITE_OTHER): Payer: Self-pay | Admitting: Physical Medicine and Rehabilitation

## 2017-07-26 ENCOUNTER — Ambulatory Visit (INDEPENDENT_AMBULATORY_CARE_PROVIDER_SITE_OTHER): Payer: 59

## 2017-07-26 VITALS — BP 162/104 | HR 80

## 2017-07-26 DIAGNOSIS — M5441 Lumbago with sciatica, right side: Secondary | ICD-10-CM

## 2017-07-26 DIAGNOSIS — S335XXA Sprain of ligaments of lumbar spine, initial encounter: Secondary | ICD-10-CM

## 2017-07-26 DIAGNOSIS — M62838 Other muscle spasm: Secondary | ICD-10-CM | POA: Diagnosis not present

## 2017-07-26 MED ORDER — BACLOFEN 10 MG PO TABS: 10.0000 mg | ORAL_TABLET | Freq: Three times a day (TID) | ORAL | 0 refills | Status: DC | PRN

## 2017-07-26 MED ORDER — PREDNISONE 50 MG PO TABS: ORAL_TABLET | ORAL | 0 refills | Status: DC

## 2017-07-26 NOTE — Progress Notes (Deleted)
Work in from triage. Patient went hiking Saturday because she has been doing very well since hip replacement, bursa injection, etc. Started noticing back pain when getting in car to come home. Pain is on right side of lower back/ buttock. Hurts to move right leg to try to get into car or when she reaches. Reports "maybe a little" numbness on side of right leg. Has been taking Tylenol for the pain.

## 2017-08-02 ENCOUNTER — Encounter (INDEPENDENT_AMBULATORY_CARE_PROVIDER_SITE_OTHER): Payer: Self-pay | Admitting: Physical Medicine and Rehabilitation

## 2017-08-02 NOTE — Progress Notes (Signed)
Natalie Bond - 61 y.o. female MRN [id number]  Date of birth: June 09, 1956  Office Visit Note: Visit Date: 07/26/2017 PCP: Leanna Battles, MD Referred by: Leanna Battles, MD  Subjective: Chief Complaint  Patient presents with  . Lower Back - Pain   HPI: Natalie Bond is a 61 year old female that I have seen on numerous occasions through care provided by several orthopedic surgeons in our office. Natalie Bond has a chronic history of back and leg pain which is felt to be spine related. We have seen her and completed epidural injection as well as lumbar sympathetic block in the past. Natalie Bond has had a history of prior left total hip arthroplasty. Natalie Bond's had some history of cardiovascular disease. 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 Natalie Bond reports that Natalie Bond went hiking on Saturday because Natalie Bond been doing so well since her hip replacement and bursa injection etc. that Natalie Bond probably did a few extra things that Natalie Bond would ordinarily done. Natalie Bond noticed some of her back pain increasing when Natalie Bond got in the car on the way home. When Natalie Bond was on the high Natalie Bond did not have any specific fall or injury. Natalie Bond did a lot of walking and moving however. Natalie Bond reports that the pain progressively increased since Saturday and the pain is on the right side of the lower back radiating into the buttock area. Natalie Bond reports it hurts to move her right leg to get in and out of a car or when Natalie Bond reaches and forward flexes. Natalie Bond denies any specific groin pain. Natalie Bond thinks that there is maybe a little bit of numbness on the right side of leg movements not her main complaint. Natalie Bond has been using Tylenol and ice as well as heat. Natalie Bond is trying to stretch. Natalie Bond has not noted any focal weakness. Natalie Bond's not had any bowel or bladder symptoms. Natalie Bond is able to bear weight and continue to move although it's painful. 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    Review of  Systems  Constitutional: Negative for chills, fever, malaise/fatigue and weight loss.  HENT: Negative for hearing loss and sinus pain.   Eyes: Negative for blurred vision, double vision and photophobia.  Respiratory: Negative for cough and shortness of breath.   Cardiovascular: Negative for chest pain, palpitations and leg swelling.  Gastrointestinal: Negative for abdominal pain, nausea and vomiting.  Genitourinary: Negative for flank pain.  Musculoskeletal: Positive for back pain. Negative for myalgias.  Skin: Negative for itching and rash.  Neurological: Negative for tremors, focal weakness and weakness.  Endo/Heme/Allergies: Negative.   Psychiatric/Behavioral: Negative for depression.  All other systems reviewed and are negative.  Otherwise per HPI.  Assessment & Plan: Visit Diagnoses:  1. Lumbar sprain, initial encounter   2. Acute right-sided low back pain with right-sided sciatica   3. Muscle spasm     Plan: Findings:  Exacerbation of pre-existing low back pain which is been chronic. We have an MRI from 2017 which really shows normal stable findings for several years. Natalie Bond has small foraminal protrusion at L2-3 with some protrusion that probably can impact the lateral recess is been on the left side. Natalie Bond has some arthritis at L3-4 more right-sided. Natalie Bond hasn't had improvement of the central disc protrusion at L5-S1. We did obtain lumbar films today that really showed no acute findings and fractures or dislocations. Stable findings in her past x-ray. Exam is  fairly nonfocal other than pain with rotation of the lumbar spine as well as tenderness to palpation of the paraspinal musculature with significant spasm. I think Natalie Bond's had a sprain strain and spasm of the right lower back. I will give her baclofen as a muscle relaxer. Natalie Bond has not had this before some vascular test this out when Natalie Bond takes it. It usually a fairly mild medication in terms of sedation at least at low doses. I also gave her  a prednisone course of 50 mg for 5 days without taper. Natalie Bond can continue her other medications. We talked about activity modification and I have told her that I'll think lifting is an issue in terms of injuring herself Natalie Bond is next to be limited by the amount of pain that Natalie Bond is having right now. Natalie Bond says that her employers are very aware of her back condition and Natalie Bond usually can get some help with the bigger or heavier jobs. I think this will be self-limiting over the next several weeks. I do want her to ice the area that is showing spasm. On her to perform some gentle stretches which we did show her. See her back as needed.    Meds & Orders:  Meds ordered this encounter  Medications  . predniSONE (DELTASONE) 50 MG tablet    Sig: Take 1 tablet daily with food for 5 days until finished    Dispense:  5 tablet    Refill:  0  . baclofen (LIORESAL) 10 MG tablet    Sig: Take 1 tablet (10 mg total) by mouth every 8 (eight) hours as needed for muscle spasms (Pain).    Dispense:  60 tablet    Refill:  0    Orders Placed This Encounter  Procedures  . XR Lumbar Spine 2-3 Views    Follow-up: Return if symptoms worsen or fail to improve.   Procedures: No procedures performed  No notes on file   Clinical History: L3-4: Diffuse annular bulge and moderate facet disease with mild bilateral lateral recess encroachment but no spinal or foraminal stenosis. There is a small synovial cyst on the right side which is new but no significant mass effect.  L4-5: Mild stable annular bulge and moderate facet disease contributing to mild lateral recess encroachment bilaterally. Possible irritation of the L5 nerve roots, left greater than right. No focal disc protrusion, significant spinal or foraminal stenosis. This level appears relatively stable.  L5-S1: Stable disc disease and facet disease and remote postsurgical changes. Mild desiccation and retraction of the central disc protrusion. No direct mass  effect on the S1 nerve roots. The exiting L5 nerve roots appear normal.  IMPRESSION: Overall stable MR examination of the lumbar spine. No significant change since 05/16/2014.  Stable shallow broad-based foraminal and extra foraminal disc protrusion on the left at L2-3 with possible mild impingement on the left L3 nerve root in the lateral recess. No definite mass effect on the exiting left L2 nerve root.  Stable mild bilateral lateral recess encroachment at L3-4. There is a new small synovial cyst on the right side but no mass effect.  Stable multifactorial mild lateral recess encroachment bilaterally at L4-5. Possible irritation of the left L5 nerve roots, left greater than right.  Stable disc disease and facet disease at L5-S1. Mild desiccation and retraction of the central disc protrusion.   Electronically Signed   By: Marijo Sanes M.D.   On: 12/19/2015 10:37  Natalie Bond reports that Natalie Bond has never smoked. Natalie Bond has never  used smokeless tobacco.   Recent Labs  03/31/17 1228  LABURIC 6.2    Objective:  VS:  HT:    WT:   BMI:     BP:(!) 162/104  HR:80bpm  TEMP: ( )  RESP:  Physical Exam  Constitutional: Natalie Bond is oriented to person, place, and time. Natalie Bond appears well-developed and well-nourished.  Eyes: Pupils are equal, round, and reactive to light. Conjunctivae and EOM are normal.  Cardiovascular: Normal rate and intact distal pulses.   Pulmonary/Chest: Effort normal.  Musculoskeletal:  Patient is slow to rise from a seated position. Natalie Bond does have focal trigger point as well as significant spasm on the right lower back. There is no real swelling or induration or redness. Natalie Bond does have pain with extension of the spine. Natalie Bond has no pain with hip rotation but no groin pain. Natalie Bond has good distal strength. Natalie Bond does have some swelling in the limbs bilaterally.  Neurological: Natalie Bond is alert and oriented to person, place, and time. Natalie Bond exhibits normal muscle tone.  Skin: Skin is  warm and dry. No rash noted. No erythema.  Psychiatric: Natalie Bond has a normal mood and affect. Her behavior is normal.  Nursing note and vitals reviewed.   Ortho Exam Imaging: No results found.  Past Medical/Family/Surgical/Social History: Medications & Allergies reviewed per EMR Patient Active Problem List   Diagnosis Date Noted  . Trochanteric bursitis, right hip 06/29/2017  . Degenerative disc disease at L5-S1 level 05/04/2017  . History of joint replacement 05/04/2017  . Claw toe, acquired, right 04/27/2017  . Achilles tendon contracture, right 04/27/2017  . Pain in right foot 04/11/2017  . Status post left hip replacement 10/21/2016  . Unilateral primary osteoarthritis, left hip 09/23/2016    Class: Chronic  . Chronic back pain 01/14/2016  . Migraine 01/14/2016  . Essential hypertension 01/22/2015   Past Medical History:  Diagnosis Date  . Arthritis   . Cancer ([place])    skin cancer  . Complication of anesthesia   . GERD (gastroesophageal reflux disease)   . Headache   . High cholesterol   . History of bronchitis   . History of kidney stones   . Hypertension   . PONV (postoperative nausea and vomiting)   . Shortness of breath dyspnea   . Sleep apnea    pt. does not use a CPAP or BiPAP  . Stroke ([place])    Pt. states possible mini stroke in 2014  . Tuberculosis    Family History  Problem Relation Age of Onset  . Cancer Mother   . Diabetes Mother   . Heart disease Mother   . Hyperlipidemia Mother   . Hypertension Mother   . Cancer Father   . Diabetes Father   . Heart disease Father   . Hyperlipidemia Father   . Hypertension Father   . Varicose Veins Father   . Cancer Sister   . Heart disease Sister   . Hyperlipidemia Sister   . Cancer Brother   . Diabetes Brother   . Hyperlipidemia Brother    Past Surgical History:  Procedure Laterality Date  . ABDOMINAL HYSTERECTOMY    . BACK SURGERY    . CARDIAC CATHETERIZATION     01/27/15 [facility]): EF 60%, normal  coronaries.  Marland Kitchen CATARACT EXTRACTION, BILATERAL    . CHOLECYSTECTOMY  07/2016   done [place]  . EYE SURGERY    . HERNIA REPAIR Left 1990   left groin   . LOWER EXTREMITY ANGIOGRAM Left 06/08/2015  Procedure: Ascending Venogram  Iliac Vein ;  Surgeon: Elam Dutch, MD;  Location: [facility] OR;  Service: Vascular;  Laterality: Left;  ;  [facility] / [facility] HAVE BEEN NOTIFIED/ CONFIRMED  . PARTIAL HYSTERECTOMY    . SHOULDER SURGERY    . TOTAL HIP ARTHROPLASTY Left 10/21/2016   Procedure: LEFT TOTAL HIP ARTHROPLASTY ANTERIOR APPROACH;  Surgeon: Mcarthur Rossetti, MD;  Location: [facility] ORS;  Service: Orthopedics;  Laterality: Left;  . WRIST SURGERY Bilateral    carpal tunnel   Social History   Occupational History  . [employer]   Social History Main Topics  . Smoking status: Never Smoker  . Smokeless tobacco: Never Used  . Alcohol use No  . Drug use: No  . Sexual activity: Not on file

## 2017-08-04 ENCOUNTER — Ambulatory Visit (INDEPENDENT_AMBULATORY_CARE_PROVIDER_SITE_OTHER): Payer: 59 | Admitting: Specialist

## 2017-08-09 ENCOUNTER — Ambulatory Visit (INDEPENDENT_AMBULATORY_CARE_PROVIDER_SITE_OTHER): Payer: 59 | Admitting: Orthopaedic Surgery

## 2017-09-20 ENCOUNTER — Ambulatory Visit (INDEPENDENT_AMBULATORY_CARE_PROVIDER_SITE_OTHER): Payer: 59 | Admitting: Specialist

## 2017-09-20 NOTE — Telephone Encounter (Signed)
Note made in error

## 2017-10-05 ENCOUNTER — Ambulatory Visit (INDEPENDENT_AMBULATORY_CARE_PROVIDER_SITE_OTHER): Payer: 59

## 2017-10-05 ENCOUNTER — Encounter (INDEPENDENT_AMBULATORY_CARE_PROVIDER_SITE_OTHER): Payer: Self-pay | Admitting: Orthopaedic Surgery

## 2017-10-05 ENCOUNTER — Ambulatory Visit (INDEPENDENT_AMBULATORY_CARE_PROVIDER_SITE_OTHER): Payer: Self-pay

## 2017-10-05 ENCOUNTER — Ambulatory Visit (INDEPENDENT_AMBULATORY_CARE_PROVIDER_SITE_OTHER): Payer: 59 | Admitting: Orthopaedic Surgery

## 2017-10-05 DIAGNOSIS — M180 Bilateral primary osteoarthritis of first carpometacarpal joints: Secondary | ICD-10-CM | POA: Diagnosis not present

## 2017-10-05 DIAGNOSIS — M79642 Pain in left hand: Secondary | ICD-10-CM

## 2017-10-05 DIAGNOSIS — Z96642 Presence of left artificial hip joint: Secondary | ICD-10-CM | POA: Diagnosis not present

## 2017-10-05 DIAGNOSIS — M79641 Pain in right hand: Secondary | ICD-10-CM | POA: Diagnosis not present

## 2017-10-05 MED ORDER — DICLOFENAC SODIUM 1 % TD GEL: 2.0000 g | Freq: Four times a day (QID) | TRANSDERMAL | 3 refills | Status: DC

## 2017-10-05 NOTE — Progress Notes (Signed)
Office Visit Note   [name]           Date of Birth: February 12, 1956           MRN: [id number] Visit Date: 10/05/2017              Requested by: Leanna Battles, MD [place], [place] [address] PCP: Leanna Battles, MD   Assessment & Plan: Visit Diagnoses:  1. History of left hip replacement   2. Bilateral hand pain   3. Primary osteoarthritis of both first carpometacarpal joints     Plan: As far as her hip goes she will follow-up as needed since she is doing so well at 1 year postoperative.  I will send in some more Voltaren gel for her to use on right hip bursitis as well as her hands.  She would like a referral to a hand surgeon releases specialist to has done surgery on basilar thumb joint arthritis to consider this.  I would like her evaluated by my partner Dr. Erlinda Hong to see if he feels the she is an appropriate surgical candidate for him to consider surgery on the basal thumb joints bilaterally likely at different times.  Follow-Up Instructions: Return if symptoms worsen or fail to improve.   Orders:  Orders Placed This Encounter  Procedures  . XR Pelvis 1-2 Views  . XR Hand Complete Left  . XR Hand Complete Right   Meds ordered this encounter  Medications  . diclofenac sodium (VOLTAREN) 1 % GEL    Sig: Apply 2 g 4 (four) times daily topically.    Dispense:  100 g    Refill:  3      Procedures: No procedures performed   Clinical Data: No additional findings.   Subjective: Chief Complaint  Patient presents with  . Left Hand - Pain  . Right Hand - Pain  Patient is here for 2 different things today.  Once she is postop at almost 1 year from a left total hip arthroplasty through direct anterior approach.  That is done well for her.  She also has bilateral hand pain as well as the base of her thumb in her hands as the source of her pain on both sides.  This is become difficult for her with gripping and pinching activities and with pain in  general during these activities.  She does a lot of activities with her hands and a lot of work-related cutting.  She has been having worsening pain at the base of her thumb on both sides for a long period of time.  She does have Voltaren gel that she places on these areas and she has had injections in the past by her primary care physician but should they wear off quickly.  She denies any numbness and tingling in her hands.  She has had bilateral carpal tunnel surgery before.  She states that her left operative hip is doing well and she has no complaints but all she has had minimal hip pain on the right side just on the side of her hip not in the groin.    HPI  Review of Systems She currently denies any headache, chest pain, shortness of breath, fever, chills, nausea, vomiting.  Objective: Vital Signs: There were no vitals taken for this visit.  Physical Exam She is alert and oriented x3 and in no acute distress.  She is walking without a limp. Ortho Exam On examination of both hips she has full range  of motion of both hips with no blocks to rotation at all.  There is no pain with rotation of either hip.  Her leg lengths are equal.  Tolerates flexion extension well.  She has a positive grind test in the base of both of her thumbs.  She has weak pinch strength bilaterally.  She has significant palpable osteophytes around the base of both thumbs.  She is neurovascular intact otherwise. Specialty Comments:  No specialty comments available.  Imaging: Xr Hand Complete Left  Result Date: 10/05/2017 X-rays of the left hand shows severe end-stage basal thumb joint arthritis  Xr Hand Complete Right  Result Date: 10/05/2017 X-rays of the right hand show severe basal thumb joint arthritis  Xr Pelvis 1-2 Views  Result Date: 10/05/2017 An x-ray of the pelvis shows a well-seated total hip arthroplasty on the left side with no comp gating features and no evidence of loosening.  Right hip appears  normal.    PMFS History: Patient Active Problem List   Diagnosis Date Noted  . Bilateral hand pain 10/05/2017  . Primary osteoarthritis of both first carpometacarpal joints 10/05/2017  . Trochanteric bursitis, right hip 06/29/2017  . Degenerative disc disease at L5-S1 level 05/04/2017  . History of joint replacement 05/04/2017  . Claw toe, acquired, right 04/27/2017  . Achilles tendon contracture, right 04/27/2017  . Pain in right foot 04/11/2017  . Status post left hip replacement 10/21/2016  . Unilateral primary osteoarthritis, left hip 09/23/2016    Class: Chronic  . Chronic back pain 01/14/2016  . Migraine 01/14/2016  . Essential hypertension 01/22/2015   Past Medical History:  Diagnosis Date  . Arthritis   . Cancer ([place])    skin cancer  . Complication of anesthesia   . GERD (gastroesophageal reflux disease)   . Headache   . High cholesterol   . History of bronchitis   . History of kidney stones   . Hypertension   . PONV (postoperative nausea and vomiting)   . Shortness of breath dyspnea   . Sleep apnea    pt. does not use a CPAP or BiPAP  . Stroke ([place])    Pt. states possible mini stroke in 2014  . Tuberculosis     Family History  Problem Relation Age of Onset  . Cancer Mother   . Diabetes Mother   . Heart disease Mother   . Hyperlipidemia Mother   . Hypertension Mother   . Cancer Father   . Diabetes Father   . Heart disease Father   . Hyperlipidemia Father   . Hypertension Father   . Varicose Veins Father   . Cancer Sister   . Heart disease Sister   . Hyperlipidemia Sister   . Cancer Brother   . Diabetes Brother   . Hyperlipidemia Brother     Past Surgical History:  Procedure Laterality Date  . ABDOMINAL HYSTERECTOMY    . BACK SURGERY    . CARDIAC CATHETERIZATION     01/27/15 [facility]): EF 60%, normal coronaries.  Marland Kitchen CATARACT EXTRACTION, BILATERAL    . CHOLECYSTECTOMY  07/2016   done [place]  . EYE SURGERY    . HERNIA REPAIR Left 1990    left groin   . PARTIAL HYSTERECTOMY    . SHOULDER SURGERY    . WRIST SURGERY Bilateral    carpal tunnel   Social History   Occupational History  . 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 Smoking status: Never  Smoker  . Smokeless tobacco: Never Used  Substance and Sexual Activity  . Alcohol use: No  . Drug use: No  . Sexual activity: Not on file

## 2017-10-09 ENCOUNTER — Ambulatory Visit (INDEPENDENT_AMBULATORY_CARE_PROVIDER_SITE_OTHER): Payer: 59 | Admitting: Orthopaedic Surgery

## 2017-10-09 ENCOUNTER — Encounter (INDEPENDENT_AMBULATORY_CARE_PROVIDER_SITE_OTHER): Payer: Self-pay | Admitting: Orthopaedic Surgery

## 2017-10-09 DIAGNOSIS — M1811 Unilateral primary osteoarthritis of first carpometacarpal joint, right hand: Secondary | ICD-10-CM | POA: Diagnosis not present

## 2017-10-09 NOTE — Progress Notes (Signed)
Office Visit Note   [name]           Date of Birth: 01-13-1956           MRN: [id number] Visit Date: 10/09/2017              Requested by: Leanna Battles, MD [place], [place] [address] PCP: Leanna Battles, MD   Assessment & Plan: Visit Diagnoses:  1. Primary osteoarthritis of first carpometacarpal joint of right hand     Plan: Impression is advanced right thumb basal joint arthritis that has failed conservative treatment.  At this point she wishes to undergo LRTI procedure.  We discussed the surgery in detail including the risk benefits alternatives and the expected recovery time.  Anticipate that she be out of work for about 6-8 weeks sooner if she is allowed to go back on modified duty.  Patient encouraged and answered.  We will get her scheduled in the near future  Follow-Up Instructions: Return if symptoms worsen or fail to improve.   Orders:  No orders of the defined types were placed in this encounter.  No orders of the defined types were placed in this encounter.     Procedures: No procedures performed   Clinical Data: No additional findings.   Subjective: Chief Complaint  Patient presents with  . Left Hand - Pain  . Right Hand - Pain    Natalie Bond is a female who comes in with bilateral thumb CMC arthritis worse in the right that has been bothering her for quite some time.  She has difficulty with ADLs.  She cuts cloth for a living.  The pain is worse at night and she uses Voltaren gel.  Denies any numbness and tingling.  She has had carpal tunnel releases in the past.    Review of Systems  Constitutional: Negative.   HENT: Negative.   Eyes: Negative.   Respiratory: Negative.   Cardiovascular: Negative.   Endocrine: Negative.   Musculoskeletal: Negative.   Neurological: Negative.   Hematological: Negative.   Psychiatric/Behavioral: Negative.   All other systems reviewed and are negative.    Objective: Vital Signs:  There were no vitals taken for this visit.  Physical Exam  Constitutional: She is oriented to person, place, and time. She appears well-developed and well-nourished.  Pulmonary/Chest: Effort normal.  Neurological: She is alert and oriented to person, place, and time.  Skin: Skin is warm. Capillary refill takes less than 2 seconds.  Psychiatric: She has a normal mood and affect. Her behavior is normal. Judgment and thought content normal.  Nursing note and vitals reviewed.   Ortho Exam Right hand exam shows positive grind test.  Negative triggering.  Negative Finkelstein's. Specialty Comments:  No specialty comments available.  Imaging: No results found.   PMFS History: Patient Active Problem List   Diagnosis Date Noted  . Primary osteoarthritis of first carpometacarpal joint of right hand 10/09/2017  . Bilateral hand pain 10/05/2017  . Primary osteoarthritis of both first carpometacarpal joints 10/05/2017  . Trochanteric bursitis, right hip 06/29/2017  . Degenerative disc disease at L5-S1 level 05/04/2017  . History of joint replacement 05/04/2017  . Claw toe, acquired, right 04/27/2017  . Achilles tendon contracture, right 04/27/2017  . Pain in right foot 04/11/2017  . Status post left hip replacement 10/21/2016  . Unilateral primary osteoarthritis, left hip 09/23/2016    Class: Chronic  . Chronic back pain 01/14/2016  . Migraine 01/14/2016  . Essential hypertension 01/22/2015  Past Medical History:  Diagnosis Date  . Arthritis   . Cancer ([place])    skin cancer  . Complication of anesthesia   . GERD (gastroesophageal reflux disease)   . Headache   . High cholesterol   . History of bronchitis   . History of kidney stones   . Hypertension   . PONV (postoperative nausea and vomiting)   . Shortness of breath dyspnea   . Sleep apnea    pt. does not use a CPAP or BiPAP  . Stroke ([place])    Pt. states possible mini stroke in 2014  . Tuberculosis     Family History    Problem Relation Age of Onset  . Cancer Mother   . Diabetes Mother   . Heart disease Mother   . Hyperlipidemia Mother   . Hypertension Mother   . Cancer Father   . Diabetes Father   . Heart disease Father   . Hyperlipidemia Father   . Hypertension Father   . Varicose Veins Father   . Cancer Sister   . Heart disease Sister   . Hyperlipidemia Sister   . Cancer Brother   . Diabetes Brother   . Hyperlipidemia Brother     Past Surgical History:  Procedure Laterality Date  . ABDOMINAL HYSTERECTOMY    . BACK SURGERY    . CARDIAC CATHETERIZATION     01/27/15 [facility]): EF 60%, normal coronaries.  Marland Kitchen CATARACT EXTRACTION, BILATERAL    . CHOLECYSTECTOMY  07/2016   [place]  . EYE SURGERY    . HERNIA REPAIR Left 1990   left groin   . PARTIAL HYSTERECTOMY    . SHOULDER SURGERY    . WRIST SURGERY Bilateral    carpal tunnel   Social History   Occupational History  . 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 Smoking status: Never Smoker  . Smokeless tobacco: Never Used  Substance and Sexual Activity  . Alcohol use: No  . Drug use: No  . Sexual activity: Not on file

## 2017-11-16 NOTE — Progress Notes (Signed)
Spoke with patient who states she will be cancelling surgery because she doesn't have medical insurance right now. Message left for Sherrie at [employer].

## 2017-11-24 ENCOUNTER — Ambulatory Visit (HOSPITAL_BASED_OUTPATIENT_CLINIC_OR_DEPARTMENT_OTHER): Admission: RE | Admit: 2017-11-24 | Payer: 59 | Source: Ambulatory Visit | Admitting: Orthopaedic Surgery

## 2017-11-24 ENCOUNTER — Encounter (HOSPITAL_BASED_OUTPATIENT_CLINIC_OR_DEPARTMENT_OTHER): Admission: RE | Payer: Self-pay | Source: Ambulatory Visit

## 2017-11-24 SURGERY — CARPOMETACARPEL (CMC) SUSPENSION PLASTY
Anesthesia: General | Site: Hand | Laterality: Right

## 2018-01-24 ENCOUNTER — Encounter (INDEPENDENT_AMBULATORY_CARE_PROVIDER_SITE_OTHER): Payer: Self-pay | Admitting: Physical Medicine and Rehabilitation

## 2018-01-24 ENCOUNTER — Ambulatory Visit (INDEPENDENT_AMBULATORY_CARE_PROVIDER_SITE_OTHER): Payer: 59 | Admitting: Physical Medicine and Rehabilitation

## 2018-01-24 VITALS — BP 182/98 | HR 60 | Temp 98.2°F

## 2018-01-24 DIAGNOSIS — M5416 Radiculopathy, lumbar region: Secondary | ICD-10-CM

## 2018-01-24 DIAGNOSIS — M5442 Lumbago with sciatica, left side: Secondary | ICD-10-CM

## 2018-01-24 DIAGNOSIS — M546 Pain in thoracic spine: Secondary | ICD-10-CM

## 2018-01-24 DIAGNOSIS — G8929 Other chronic pain: Secondary | ICD-10-CM

## 2018-01-24 DIAGNOSIS — M7918 Myalgia, other site: Secondary | ICD-10-CM

## 2018-01-24 DIAGNOSIS — M25511 Pain in right shoulder: Secondary | ICD-10-CM

## 2018-01-24 DIAGNOSIS — M961 Postlaminectomy syndrome, not elsewhere classified: Secondary | ICD-10-CM

## 2018-01-24 MED ORDER — BACLOFEN 10 MG PO TABS: 10.0000 mg | ORAL_TABLET | Freq: Three times a day (TID) | ORAL | 0 refills | Status: DC | PRN

## 2018-01-24 NOTE — Progress Notes (Deleted)
Pt states pain in middle and lower back that radiates through the left leg. Pt also states pain in right shoulder. Pt states pain started 2 months ago. Pt states working makes pain worse, nothing makes pain better.

## 2018-01-26 ENCOUNTER — Encounter (INDEPENDENT_AMBULATORY_CARE_PROVIDER_SITE_OTHER): Payer: Self-pay | Admitting: Physical Medicine and Rehabilitation

## 2018-01-26 DIAGNOSIS — M25511 Pain in right shoulder: Secondary | ICD-10-CM | POA: Diagnosis not present

## 2018-01-26 DIAGNOSIS — G8929 Other chronic pain: Secondary | ICD-10-CM | POA: Diagnosis not present

## 2018-01-26 MED ORDER — TRIAMCINOLONE ACETONIDE 40 MG/ML IJ SUSP
40.0000 mg | INTRAMUSCULAR | Status: AC | PRN
  Administered 2018-01-26: 40 mg via INTRA_ARTICULAR

## 2018-01-26 MED ORDER — BUPIVACAINE HCL 0.25 % IJ SOLN
4.0000 mL | INTRAMUSCULAR | Status: AC | PRN
  Administered 2018-01-26: 4 mL via INTRA_ARTICULAR

## 2018-01-26 NOTE — Progress Notes (Signed)
Natalie Bond - 62 y.o. female MRN [id number]  Date of birth: March 03, 1956  Office Visit Note: Visit Date: 01/24/2018 PCP: Leanna Battles, MD Referred by: Leanna Battles, MD  Subjective: Chief Complaint  Patient presents with  . Lower Back - Pain  . Middle Back - Pain  . Left Leg - Pain   HPI: Natalie Bond is a 62 year old female who is followed by several of the orthopedic surgeons in our office.  The last time I saw her was in August of last year.  She comes in today with worsening 65-month history of low back and left radicular leg pain mainly she is also complaining of some upper mid back pain still in the left side.  She reports no specific injury but reports that she has switched jobs now and the job she has now is even more physical than what she had before.  She relates a lot of her pain to physical movement and having to move things at work and in general having to lift and do certain things.  She has had 2 months of worsening pain in the left leg in a posterior lateral fashion through the low back and buttock area down the left.  Left leg pain is been a chronic situation for many years.  She has had prior remote surgical laminectomy at L5-S1.  She is done well in the past with epidural injection.  There was also a time where the swelling in the left leg was felt to be possibly related to complex regional pain syndrome and in fact sympathetic blocks did seem to help the swelling quite a bit.  She does not endorse any symptoms of CRPS today except for increased swelling.  She has had vascular studies which were negative.  She also reports that recently she had a incident where she had a dog bite on her left foot and did see urgent care and she is on antibiotics for that.  She is asking today about whether it is okay to walk on the foot after a dog bite.  Urgent care told her there was no problem.  She is questioning and very anxious about infection risk with her left total hip arthroplasty.   I have told her that we can set her up to see Dr. Ninfa Linden to answer those questions about a superficial dog bite and infection risk for total hip arthroplasty although I felt like it is probably not something you necessarily worry about unless the person was septic.  I tried to explain that at length.  She also reports right shoulder pain ongoing for a couple of months.  She reports this increased from pushing and pulling.  She has had injection in the shoulder before for bursitis.  She reports it is keeping her up at night.  She has not noted any focal weakness or other red flag complaints.  We have had imaging updated for her lumbar spine over the last year or so.  She also has significant osteoarthritis of both hands particularly in the right thumb and was scheduled to undergo surgery with Dr. Erlinda Hong.  She is still running look into that and she has had a change in her insurance.    Review of Systems  Constitutional: Negative for chills, fever, malaise/fatigue and weight loss.  HENT: Negative for hearing loss and sinus pain.   Eyes: Negative for blurred vision, double vision and photophobia.  Respiratory: Negative for cough and shortness of breath.   Cardiovascular: Negative for chest  pain, palpitations and leg swelling.  Gastrointestinal: Negative for abdominal pain, nausea and vomiting.  Genitourinary: Negative for flank pain.  Musculoskeletal: Positive for back pain and joint pain. Negative for myalgias.  Skin: Negative for itching and rash.  Neurological: Positive for tingling. Negative for tremors, focal weakness and weakness.  Endo/Heme/Allergies: Negative.   Psychiatric/Behavioral: Negative for depression.  All other systems reviewed and are negative.  Otherwise per HPI.  Assessment & Plan: Visit Diagnoses:  1. Lumbar radiculopathy   2. Post laminectomy syndrome   3. Chronic bilateral low back pain with left-sided sciatica   4. Chronic left-sided thoracic back pain   5. Myofascial  pain syndrome   6. Chronic right shoulder pain     Plan: Findings:  Somewhat complicated orthopedic patient with multiple complaints over the years of various joints and back pain and leg pain.  In terms of her left low back and hip and leg pain we have not completed an injection for quite some time in the last injection she said helped quite significantly.  That has been mainly a combination of left S1 transforaminal injection versus at least on 2 occasions we completed lumbar sympathetic blocks for what was thought to possibly be complex regional pain syndrome.  She has had prior lumbar laminectomy remotely.  She comes in today with symptoms consistent with flareup of the left L5 or S1 radicular pain.  I cannot rule out a greater trochanteric bursitis although the exam is not really exciting on that front.  She also reports right shoulder pain with impingement signs.  We can do a subacromial injection today given the patient's pain complaints and this would be diagnostic and hopefully therapeutic.  We will seek preauthorization for a left S1 transforaminal epidural steroid injection.  She has had physical therapy on multiple occasions.  She does know her home exercises and she continues to do those.  She is a very active individual and continues to work steadily.  Her new job unfortunately she reports is even more physical and her last job.  Again in terms of the dog bite she will follow-up with Dr. Ninfa Linden for questions concerning treatment versus infection risk of her total hip arthroplasty which I tried explained to her was probably not significant.  I am also going to add back a muscle relaxer for her.    Meds & Orders:  Meds ordered this encounter  Medications  . baclofen (LIORESAL) 10 MG tablet    Sig: Take 1 tablet (10 mg total) by mouth every 8 (eight) hours as needed for muscle spasms (Pain).    Dispense:  60 tablet    Refill:  0    Orders Placed This Encounter  Procedures  . Large Joint  Inj    Follow-up: Return for Left S1 transforaminal injection after approval.   Procedures: Large Joint Inj: R subacromial bursa on 01/26/2018 5:13 AM Indications: pain and diagnostic evaluation Details: 25 G 1.5 in needle, posterior approach  Arthrogram: No  Medications: 40 mg triamcinolone acetonide 40 MG/ML; 4 mL bupivacaine 0.25 % Outcome: tolerated well, no immediate complications  Injectate delivered freely without restriction through the normal position. Patient seemed to have relief during the anesthetic phase.  Procedure, treatment alternatives, risks and benefits explained, specific risks discussed. Consent was given by the patient. Immediately prior to procedure a time out was called to verify the correct patient, procedure, equipment, support staff and site/side marked as required. Patient was prepped and draped in the usual sterile fashion.  No notes on file   Clinical History: L3-4: Diffuse annular bulge and moderate facet disease with mild bilateral lateral recess encroachment but no spinal or foraminal stenosis. There is a small synovial cyst on the right side which is new but no significant mass effect.  L4-5: Mild stable annular bulge and moderate facet disease contributing to mild lateral recess encroachment bilaterally. Possible irritation of the L5 nerve roots, left greater than right. No focal disc protrusion, significant spinal or foraminal stenosis. This level appears relatively stable.  L5-S1: Stable disc disease and facet disease and remote postsurgical changes. Mild desiccation and retraction of the central disc protrusion. No direct mass effect on the S1 nerve roots. The exiting L5 nerve roots appear normal.  IMPRESSION: Overall stable MR examination of the lumbar spine. No significant change since 05/16/2014.  Stable shallow broad-based foraminal and extra foraminal disc protrusion on the left at L2-3 with possible mild impingement on  the left L3 nerve root in the lateral recess. No definite mass effect on the exiting left L2 nerve root.  Stable mild bilateral lateral recess encroachment at L3-4. There is a new small synovial cyst on the right side but no mass effect.  Stable multifactorial mild lateral recess encroachment bilaterally at L4-5. Possible irritation of the left L5 nerve roots, left greater than right.  Stable disc disease and facet disease at L5-S1. Mild desiccation and retraction of the central disc protrusion.   Electronically Signed   By: Marijo Sanes M.D.   On: 12/19/2015 10:37  She reports that  has never smoked. she has never used smokeless tobacco.  Recent Labs    03/31/17 1228  LABURIC 6.2    Objective:  VS:  HT:    WT:   BMI:     BP:(!) 182/98  HR:60bpm  TEMP:98.2 F (36.8 C)(Oral)  RESP:98 % Physical Exam  Constitutional: She is oriented to person, place, and time. She appears well-developed and well-nourished. No distress.  HENT:  Head: Normocephalic and atraumatic.  Nose: Nose normal.  Mouth/Throat: Oropharynx is clear and moist.  Eyes: Conjunctivae are normal. Pupils are equal, round, and reactive to light.  Neck: Normal range of motion. Neck supple.  Cardiovascular: Regular rhythm and intact distal pulses.  Pulmonary/Chest: Effort normal. No respiratory distress.  Abdominal: She exhibits no distension. There is no guarding.  Musculoskeletal:  Examination of the right shoulder shows impingement signs with external rotation and a positive Hawkins test.  She has a negative drop arm test.  She has no pain over the [facility] joint.  She has good strength in the upper extremity with severe osteoarthritis of the right CMC joint.  Examination of the lower spine shows the patient can go from sit to stand without great difficulty but pain with extension of the low back.  She has no pain with hip rotation in her left hip which has been replaced moves freely.  She has mild pain over the  greater trochanter which is not exquisite pain but could still be a source of her pain.  She has good distal strength without clonus.  She does have swelling more on the left than the right which is normal for her.  She has good pulses bilaterally.  There is no allodynia there is no pseudomotor signs.  Neurological: She is alert and oriented to person, place, and time. She exhibits normal muscle tone. Coordination normal.  Skin: Skin is warm. No rash noted. No erythema.  Psychiatric: She has a normal mood and affect.  Her behavior is normal.  Nursing note and vitals reviewed.   Ortho Exam Imaging: No results found.  Past Medical/Family/Surgical/Social History: Medications & Allergies reviewed per EMR Patient Active Problem List   Diagnosis Date Noted  . Primary osteoarthritis of first carpometacarpal joint of right hand 10/09/2017  . Bilateral hand pain 10/05/2017  . Primary osteoarthritis of both first carpometacarpal joints 10/05/2017  . Trochanteric bursitis, right hip 06/29/2017  . Degenerative disc disease at L5-S1 level 05/04/2017  . History of joint replacement 05/04/2017  . Claw toe, acquired, right 04/27/2017  . Achilles tendon contracture, right 04/27/2017  . Pain in right foot 04/11/2017  . Status post left hip replacement 10/21/2016  . Unilateral primary osteoarthritis, left hip 09/23/2016    Class: Chronic  . Chronic back pain 01/14/2016  . Migraine 01/14/2016  . Essential hypertension 01/22/2015   Past Medical History:  Diagnosis Date  . Arthritis   . Cancer ([place])    skin cancer  . Complication of anesthesia   . GERD (gastroesophageal reflux disease)   . Headache   . High cholesterol   . History of bronchitis   . History of kidney stones   . Hypertension   . PONV (postoperative nausea and vomiting)   . Shortness of breath dyspnea   . Sleep apnea    pt. does not use a CPAP or BiPAP  . Stroke ([place])    Pt. states possible mini stroke in 2014  . Tuberculosis      Family History  Problem Relation Age of Onset  . Cancer Mother   . Diabetes Mother   . Heart disease Mother   . Hyperlipidemia Mother   . Hypertension Mother   . Cancer Father   . Diabetes Father   . Heart disease Father   . Hyperlipidemia Father   . Hypertension Father   . Varicose Veins Father   . Cancer Sister   . Heart disease Sister   . Hyperlipidemia Sister   . Cancer Brother   . Diabetes Brother   . Hyperlipidemia Brother    Past Surgical History:  Procedure Laterality Date  . ABDOMINAL HYSTERECTOMY    . BACK SURGERY    . CARDIAC CATHETERIZATION     01/27/15 [facility]): EF 60%, normal coronaries.  Marland Kitchen CATARACT EXTRACTION, BILATERAL    . CHOLECYSTECTOMY  07/2016   done [place]  . EYE SURGERY    . HERNIA REPAIR Left 1990   left groin   . LOWER EXTREMITY ANGIOGRAM Left 06/08/2015   Procedure: Ascending Venogram  Iliac Vein ;  Surgeon: Elam Dutch, MD;  Location: [facility] OR;  Service: Vascular;  Laterality: Left;  ;  TARA- [facility] / [facility]ACH[facility] HAVE BEEN NOTIFIED/ CONFIRMED  . PARTIAL HYSTERECTOMY    . SHOULDER SURGERY    . TOTAL HIP ARTHROPLASTY Left 10/21/2016   Procedure: LEFT TOTAL HIP ARTHROPLASTY ANTERIOR APPROACH;  Surgeon: Mcarthur Rossetti, MD;  Location: [facility] ORS;  Service: Orthopedics;  Laterality: Left;  . WRIST SURGERY Bilateral    carpal tunnel   Social History   Occupational History  . 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 Smoking status: Never Smoker  . Smokeless tobacco: Never Used  Substance and Sexual Activity  . Alcohol use: No  . Drug use: No  . Sexual activity: Not on file

## 2018-02-01 ENCOUNTER — Encounter (INDEPENDENT_AMBULATORY_CARE_PROVIDER_SITE_OTHER): Payer: Self-pay | Admitting: Physical Medicine and Rehabilitation

## 2018-02-01 ENCOUNTER — Ambulatory Visit (INDEPENDENT_AMBULATORY_CARE_PROVIDER_SITE_OTHER): Payer: 59 | Admitting: Physical Medicine and Rehabilitation

## 2018-02-01 ENCOUNTER — Ambulatory Visit (INDEPENDENT_AMBULATORY_CARE_PROVIDER_SITE_OTHER): Payer: 59

## 2018-02-01 VITALS — BP 165/90 | HR 50 | Temp 97.9°F

## 2018-02-01 DIAGNOSIS — M5416 Radiculopathy, lumbar region: Secondary | ICD-10-CM

## 2018-02-01 DIAGNOSIS — M961 Postlaminectomy syndrome, not elsewhere classified: Secondary | ICD-10-CM | POA: Diagnosis not present

## 2018-02-01 MED ORDER — BETAMETHASONE SOD PHOS & ACET 6 (3-3) MG/ML IJ SUSP
12.0000 mg | Freq: Once | INTRAMUSCULAR | Status: AC
  Administered 2018-02-01: 12 mg

## 2018-02-01 NOTE — Progress Notes (Signed)
 .  Numeric Pain Rating Scale and Functional Assessment Average Pain 8   In the last MONTH (on 0-10 scale) has pain interfered with the following?  1. General activity like being  able to carry out your everyday physical activities such as walking, climbing stairs, carrying groceries, or moving a chair?  Rating(7)   +Driver, -BT, -Dye Allergies.  

## 2018-02-01 NOTE — Progress Notes (Signed)
Natalie Bond - 62 y.o. female MRN [id number]  Date of birth: [birth year]  Office Visit Note: Visit Date: 02/01/2018 PCP: Leanna Battles, MD Referred by: Leanna Battles, MD  Subjective: Chief Complaint  Patient presents with  . Lower Back - Pain   HPI: [name] comes in today for planned left S1 transforaminal epidural steroid injection.  She is a 4-year-old female that we have seen on several occasion and she sees several of the orthopedic doctors in our practice.  She has had prior lumbar laminectomy and microdiscectomy at L5-S1.  Please see our prior evaluation and management note for further details and justification.    ROS Otherwise per HPI.  Assessment & Plan: Visit Diagnoses:  1. Lumbar radiculopathy   2. Post laminectomy syndrome     Plan: No additional findings.   Meds & Orders:  Meds ordered this encounter  Medications  . betamethasone acetate-betamethasone sodium phosphate (CELESTONE) injection 12 mg    Orders Placed This Encounter  Procedures  . XR C-ARM NO REPORT  . Epidural Steroid injection    Follow-up: Return if symptoms worsen or fail to improve.   Procedures: No procedures performed  S1 Lumbosacral Transforaminal Epidural Steroid Injection - Sub-Pedicular Approach with Fluoroscopic Guidance   [name]      Date of Birth: [birth year] MRN: [id number] PCP: Leanna Battles, MD      Visit Date: 02/01/2018   Universal Protocol:    Date/Time: 02/01/1910:15 AM  Consent Given By: the patient  Position:  PRONE  Additional Comments: Vital signs were monitored before and after the procedure. Patient was prepped and draped in the usual sterile fashion. The correct patient, procedure, and site was verified.   Injection Procedure Details:  Procedure Site One Meds Administered:  Meds ordered this encounter  Medications  . betamethasone acetate-betamethasone sodium phosphate (CELESTONE) injection 12 mg    Laterality:  Left  Location/Site:  S1 Foramen   Needle size: 22 ga.  Needle type: Spinal  Needle Placement: Transforaminal  Findings:   -Comments: Excellent flow of contrast along the nerve and into the epidural space.  Procedure Details: After squaring off the sacral end-plate to get a true AP view, the C-arm was positioned so that the best possible view of the S1 foramen was visualized. The soft tissues overlying this structure were infiltrated with 2-3 ml. of 1% Lidocaine without Epinephrine.    The spinal needle was inserted toward the target using a "trajectory" view along the fluoroscope beam.  Under AP and lateral visualization, the needle was advanced so it did not puncture dura. Biplanar projections were used to confirm position. Aspiration was confirmed to be negative for CSF and/or blood. A 1-2 ml. volume of Isovue-250 was injected and flow of contrast was noted at each level. Radiographs were obtained for documentation purposes.   After attaining the desired flow of contrast documented above, a 0.5 to 1.0 ml test dose of 0.25% Marcaine was injected into each respective transforaminal space.  The patient was observed for 90 seconds post injection.  After no sensory deficits were reported, and normal lower extremity motor function was noted,   the above injectate was administered so that equal amounts of the injectate were placed at each foramen (level) into the transforaminal epidural space.   Additional Comments:  The patient tolerated the procedure well Dressing: Band-Aid    Post-procedure details: Patient was observed during the procedure. Post-procedure instructions were reviewed.  Patient left the clinic in stable condition.  Clinical History: L3-4: Diffuse annular bulge and moderate facet disease with mild bilateral lateral recess encroachment but no spinal or foraminal stenosis. There is a small synovial cyst on the right side which is new but no significant mass  effect.  L4-5: Mild stable annular bulge and moderate facet disease contributing to mild lateral recess encroachment bilaterally. Possible irritation of the L5 nerve roots, left greater than right. No focal disc protrusion, significant spinal or foraminal stenosis. This level appears relatively stable.  L5-S1: Stable disc disease and facet disease and remote postsurgical changes. Mild desiccation and retraction of the central disc protrusion. No direct mass effect on the S1 nerve roots. The exiting L5 nerve roots appear normal.  IMPRESSION: Overall stable MR examination of the lumbar spine. No significant change since 05/16/2014.  Stable shallow broad-based foraminal and extra foraminal disc protrusion on the left at L2-3 with possible mild impingement on the left L3 nerve root in the lateral recess. No definite mass effect on the exiting left L2 nerve root.  Stable mild bilateral lateral recess encroachment at L3-4. There is a new small synovial cyst on the right side but no mass effect.  Stable multifactorial mild lateral recess encroachment bilaterally at L4-5. Possible irritation of the left L5 nerve roots, left greater than right.  Stable disc disease and facet disease at L5-S1. Mild desiccation and retraction of the central disc protrusion.   Electronically Signed   By: Marijo Sanes M.D.   On: 12/19/2015 10:37  She reports that  has never smoked. she has never used smokeless tobacco.  Recent Labs    03/31/17 1228  LABURIC 6.2    Objective:  VS:  HT:    WT:   BMI:     BP:(!) 165/90  HR:(!) 50bpm  TEMP:97.9 F (36.6 C)(Oral)  RESP:100 % Physical Exam  Musculoskeletal:  Patient ambulates without aid with good distal strength.  She does have more swelling and edema on the left than right which is chronic    Ortho Exam Imaging: No results found.  Past Medical/Family/Surgical/Social History: Medications & Allergies reviewed per EMR Patient Active  Problem List   Diagnosis Date Noted  . Primary osteoarthritis of first carpometacarpal joint of right hand 10/09/2017  . Bilateral hand pain 10/05/2017  . Primary osteoarthritis of both first carpometacarpal joints 10/05/2017  . Trochanteric bursitis, right hip 06/29/2017  . Degenerative disc disease at L5-S1 level 05/04/2017  . History of joint replacement 05/04/2017  . Claw toe, acquired, right 04/27/2017  . Achilles tendon contracture, right 04/27/2017  . Pain in right foot 04/11/2017  . Status post left hip replacement 10/21/2016  . Unilateral primary osteoarthritis, left hip 09/23/2016    Class: Chronic  . Chronic back pain 01/14/2016  . Migraine 01/14/2016  . Essential hypertension 01/22/2015   Past Medical History:  Diagnosis Date  . Arthritis   . Cancer ([place])    skin cancer  . Complication of anesthesia   . GERD (gastroesophageal reflux disease)   . Headache   . High cholesterol   . History of bronchitis   . History of kidney stones   . Hypertension   . PONV (postoperative nausea and vomiting)   . Shortness of breath dyspnea   . Sleep apnea    pt. does not use a CPAP or BiPAP  . Stroke ([place])    Pt. states possible mini stroke in 2014  . Tuberculosis    Family History  Problem Relation Age of Onset  . Cancer Mother   .  Diabetes Mother   . Heart disease Mother   . Hyperlipidemia Mother   . Hypertension Mother   . Cancer Father   . Diabetes Father   . Heart disease Father   . Hyperlipidemia Father   . Hypertension Father   . Varicose Veins Father   . Cancer Sister   . Heart disease Sister   . Hyperlipidemia Sister   . Cancer Brother   . Diabetes Brother   . Hyperlipidemia Brother    Past Surgical History:  Procedure Laterality Date  . ABDOMINAL HYSTERECTOMY    . BACK SURGERY    . CARDIAC CATHETERIZATION     01/27/15 [facility]): EF 60%, normal coronaries.  Marland Kitchen CATARACT EXTRACTION, BILATERAL    . CHOLECYSTECTOMY  07/2016   done [place]  . EYE  SURGERY    . HERNIA REPAIR Left 1990   left groin   . LOWER EXTREMITY ANGIOGRAM Left 06/08/2015   Procedure: Ascending Venogram  Iliac Vein ;  Surgeon: Elam Dutch, MD;  Location: [facility] OR;  Service: Vascular;  Laterality: Left;  ;  TARA- [facility] / [facility]-[place] HAVE BEEN NOTIFIED/ CONFIRMED  . PARTIAL HYSTERECTOMY    . SHOULDER SURGERY    . TOTAL HIP ARTHROPLASTY Left 10/21/2016   Procedure: LEFT TOTAL HIP ARTHROPLASTY ANTERIOR APPROACH;  Surgeon: Mcarthur Rossetti, MD;  Location: [facility] ORS;  Service: Orthopedics;  Laterality: Left;  . WRIST SURGERY Bilateral    carpal tunnel   Social History   Occupational History  . Occupation: braxton Therapist, art: [employer]  Tobacco Use  . Smoking status: Never Smoker  . Smokeless tobacco: Never Used  Substance and Sexual Activity  . Alcohol use: No  . Drug use: No  . Sexual activity: Not on file

## 2018-02-01 NOTE — Patient Instructions (Signed)

## 2018-02-01 NOTE — Procedures (Signed)
S1 Lumbosacral Transforaminal Epidural Steroid Injection - Sub-Pedicular Approach with Fluoroscopic Guidance   [name]      Date of Birth: Dec 05, 1955 MRN: [id number] PCP: Leanna Battles, MD      Visit Date: 02/01/2018   Universal Protocol:    Date/Time: 02/01/1910:15 AM  Consent Given By: the patient  Position:  PRONE  Additional Comments: Vital signs were monitored before and after the procedure. Patient was prepped and draped in the usual sterile fashion. The correct patient, procedure, and site was verified.   Injection Procedure Details:  Procedure Site One Meds Administered:  Meds ordered this encounter  Medications  . betamethasone acetate-betamethasone sodium phosphate (CELESTONE) injection 12 mg    Laterality: Left  Location/Site:  S1 Foramen   Needle size: 22 ga.  Needle type: Spinal  Needle Placement: Transforaminal  Findings:   -Comments: Excellent flow of contrast along the nerve and into the epidural space.  Procedure Details: After squaring off the sacral end-plate to get a true AP view, the C-arm was positioned so that the best possible view of the S1 foramen was visualized. The soft tissues overlying this structure were infiltrated with 2-3 ml. of 1% Lidocaine without Epinephrine.    The spinal needle was inserted toward the target using a "trajectory" view along the fluoroscope beam.  Under AP and lateral visualization, the needle was advanced so it did not puncture dura. Biplanar projections were used to confirm position. Aspiration was confirmed to be negative for CSF and/or blood. A 1-2 ml. volume of Isovue-250 was injected and flow of contrast was noted at each level. Radiographs were obtained for documentation purposes.   After attaining the desired flow of contrast documented above, a 0.5 to 1.0 ml test dose of 0.25% Marcaine was injected into each respective transforaminal space.  The patient was observed for 90 seconds post  injection.  After no sensory deficits were reported, and normal lower extremity motor function was noted,   the above injectate was administered so that equal amounts of the injectate were placed at each foramen (level) into the transforaminal epidural space.   Additional Comments:  The patient tolerated the procedure well Dressing: Band-Aid    Post-procedure details: Patient was observed during the procedure. Post-procedure instructions were reviewed.  Patient left the clinic in stable condition.

## 2018-02-05 ENCOUNTER — Ambulatory Visit (INDEPENDENT_AMBULATORY_CARE_PROVIDER_SITE_OTHER): Payer: Self-pay | Admitting: Physician Assistant

## 2018-02-19 ENCOUNTER — Encounter (INDEPENDENT_AMBULATORY_CARE_PROVIDER_SITE_OTHER): Payer: Self-pay | Admitting: Physical Medicine and Rehabilitation

## 2018-04-04 ENCOUNTER — Telehealth (INDEPENDENT_AMBULATORY_CARE_PROVIDER_SITE_OTHER): Payer: Self-pay | Admitting: Physical Medicine and Rehabilitation

## 2018-04-04 NOTE — Telephone Encounter (Signed)
We can do both, could try repeat of lumbar sympathetic block and a f/up with Sharol Given for foot/ankle

## 2018-04-05 NOTE — Telephone Encounter (Signed)
Spoke with Natalie Bond and states pt does not need authorization for procedure reference [phone]

## 2018-04-05 NOTE — Telephone Encounter (Signed)
Patient needs auth for [id number]. She is aware we will call her back to schedule.

## 2018-04-09 ENCOUNTER — Ambulatory Visit (INDEPENDENT_AMBULATORY_CARE_PROVIDER_SITE_OTHER): Payer: 59

## 2018-04-09 ENCOUNTER — Encounter (INDEPENDENT_AMBULATORY_CARE_PROVIDER_SITE_OTHER): Payer: Self-pay | Admitting: Orthopedic Surgery

## 2018-04-09 ENCOUNTER — Ambulatory Visit (INDEPENDENT_AMBULATORY_CARE_PROVIDER_SITE_OTHER): Payer: 59 | Admitting: Orthopedic Surgery

## 2018-04-09 VITALS — Ht 67.0 in | Wt 170.0 lb

## 2018-04-09 DIAGNOSIS — M25572 Pain in left ankle and joints of left foot: Secondary | ICD-10-CM

## 2018-04-09 DIAGNOSIS — I872 Venous insufficiency (chronic) (peripheral): Secondary | ICD-10-CM | POA: Insufficient documentation

## 2018-04-09 DIAGNOSIS — M6702 Short Achilles tendon (acquired), left ankle: Secondary | ICD-10-CM | POA: Diagnosis not present

## 2018-04-09 DIAGNOSIS — H6690 Otitis media, unspecified, unspecified ear: Secondary | ICD-10-CM | POA: Insufficient documentation

## 2018-04-09 DIAGNOSIS — R059 Cough, unspecified: Secondary | ICD-10-CM | POA: Insufficient documentation

## 2018-04-09 NOTE — Progress Notes (Signed)
Office Visit Note   [name]           Date of Birth: 1956/08/15           MRN: [id number] Visit Date: 04/09/2018              Requested by: Leanna Battles, [place] [place], [place] [address] PCP: Leanna Battles, MD  Chief Complaint  Patient presents with  . Left Foot - Pain  . Left Ankle - Pain      HPI: Patient is a 61 year old woman who complains of burning pain in the left Achilles and left heel.  Patient states that this was made worse when she mowed her yard 1 acre with a push mower.  Patient states she is status post lumbar spine surgery in 2004 she states she has had numbness over the Achilles tendon since the time of her surgery which she states that was at L5-S1.  She states she does get injections with Dr. Ernestina Patches that helps with her pain symptoms.  Assessment & Plan: Visit Diagnoses:  1. Pain in left ankle and joints of left foot   2. Achilles tendon contracture, left   3. Venous stasis dermatitis of left lower extremity     Plan: Patient was given instructions to obtain knee-high compression stockings for the swelling in the left leg either venous or lymphatic swelling.  Patient was given instructions and demonstrated Achilles stretching for her Achilles contracture.  Follow-Up Instructions: Return in about 1 month (around 05/07/2018).   Ortho Exam  Patient is alert, oriented, no adenopathy, well-dressed, normal affect, normal respiratory effort. Examination patient does have increased swelling in the left leg compared to the right there is no brawny skin color changes no ulcers.  She has good pulses she has significant heel cord contracture on the left compared to the right the left foot with the knee extended has dorsiflexion 20 degrees short of neutral right foot she has dorsiflexion 20 degrees past neutral.  She has pain to palpation along the Achilles but no nodular changes she has tenderness to palpation along the plantar fascia  lateral compression of the calcaneus is nontender no evidence of a stress fracture.  Patient complains of chronic numbness over the posterior Achilles and heel.  Imaging: Xr Ankle 2 Views Left  Result Date: 04/09/2018 2 view radiographs of the left ankle shows no evidence of a stress fracture no bony abnormalities.  The mortise is congruent.  Xr Foot 2 Views Left  Result Date: 04/09/2018 2 view radiographs of the left foot shows no stress fractures no bony abnormalities.  No images are attached to the encounter.  Labs: Lab Results  Component Value Date   ESRSEDRATE 4 03/31/2017   CRP 11.8 (H) 03/31/2017   LABURIC 6.2 03/31/2017     Lab Results  Component Value Date   ALBUMIN 4.1 03/31/2017   LABURIC 6.2 03/31/2017    Body mass index is 26.63 kg/m.  Orders:  Orders Placed This Encounter  Procedures  . XR Foot 2 Views Left  . XR Ankle 2 Views Left   No orders of the defined types were placed in this encounter.    Procedures: No procedures performed  Clinical Data: No additional findings.  ROS:  All other systems negative, except as noted in the HPI. Review of Systems  Objective: Vital Signs: Ht 5\' 7"  (1.702 m)   Wt 170 lb (77.1 kg)   BMI 26.63 kg/m   Specialty Comments:  No specialty comments available.  PMFS History: Patient Active Problem List   Diagnosis Date Noted  . Venous stasis dermatitis of left lower extremity 04/09/2018  . Achilles tendon contracture, left 04/09/2018  . Pain in left ankle and joints of left foot 04/09/2018  . Primary osteoarthritis of first carpometacarpal joint of right hand 10/09/2017  . Bilateral hand pain 10/05/2017  . Primary osteoarthritis of both first carpometacarpal joints 10/05/2017  . Trochanteric bursitis, right hip 06/29/2017  . Degenerative disc disease at L5-S1 level 05/04/2017  . History of joint replacement 05/04/2017  . Claw toe, acquired, right 04/27/2017  . Achilles tendon contracture, right  04/27/2017  . Pain in right foot 04/11/2017  . Status post left hip replacement 10/21/2016  . Unilateral primary osteoarthritis, left hip 09/23/2016    Class: Chronic  . Chronic back pain 01/14/2016  . Migraine 01/14/2016  . Essential hypertension 01/22/2015   Past Medical History:  Diagnosis Date  . Arthritis   . Cancer ([place])    skin cancer  . Complication of anesthesia   . GERD (gastroesophageal reflux disease)   . Headache   . High cholesterol   . History of bronchitis   . History of kidney stones   . Hypertension   . PONV (postoperative nausea and vomiting)   . Shortness of breath dyspnea   . Sleep apnea    pt. does not use a CPAP or BiPAP  . Stroke ([place])    Pt. states possible mini stroke in 2014  . Tuberculosis     Family History  Problem Relation Age of Onset  . Cancer Mother   . Diabetes Mother   . Heart disease Mother   . Hyperlipidemia Mother   . Hypertension Mother   . Cancer Father   . Diabetes Father   . Heart disease Father   . Hyperlipidemia Father   . Hypertension Father   . Varicose Veins Father   . Cancer Sister   . Heart disease Sister   . Hyperlipidemia Sister   . Cancer Brother   . Diabetes Brother   . Hyperlipidemia Brother     Past Surgical History:  Procedure Laterality Date  . ABDOMINAL HYSTERECTOMY    . BACK SURGERY    . CARDIAC CATHETERIZATION     01/27/15 [facility]): EF 60%, normal coronaries.  Marland Kitchen CATARACT EXTRACTION, BILATERAL    . CHOLECYSTECTOMY  07/2016   done [place]  . EYE SURGERY    . HERNIA REPAIR Left 1990   left groin   . LOWER EXTREMITY ANGIOGRAM Left 06/08/2015   Procedure: Ascending Venogram  Iliac Vein ;  Surgeon: Elam Dutch, MD;  Location: [facility] OR;  Service: Vascular;  Laterality: Left;  ;  TARA- [facility] SCIENTIFIC / ZACH-[place] HAVE BEEN NOTIFIED/ CONFIRMED  . PARTIAL HYSTERECTOMY    . SHOULDER SURGERY    . TOTAL HIP ARTHROPLASTY Left 10/21/2016   Procedure: LEFT TOTAL HIP ARTHROPLASTY ANTERIOR APPROACH;   Surgeon: Mcarthur Rossetti, MD;  Location: [facility] ORS;  Service: Orthopedics;  Laterality: Left;  . WRIST SURGERY Bilateral    carpal tunnel   Social History   Occupational History  . 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 Smoking status: Never Smoker  . Smokeless tobacco: Never Used  Substance and Sexual Activity  . Alcohol use: No  . Drug use: No  . Sexual activity: Not on file

## 2018-04-16 DIAGNOSIS — Z22322 Carrier or suspected carrier of Methicillin resistant Staphylococcus aureus: Secondary | ICD-10-CM | POA: Insufficient documentation

## 2018-04-26 ENCOUNTER — Ambulatory Visit (INDEPENDENT_AMBULATORY_CARE_PROVIDER_SITE_OTHER): Payer: 59

## 2018-04-26 ENCOUNTER — Ambulatory Visit (INDEPENDENT_AMBULATORY_CARE_PROVIDER_SITE_OTHER): Payer: 59 | Admitting: Physical Medicine and Rehabilitation

## 2018-04-26 ENCOUNTER — Encounter (INDEPENDENT_AMBULATORY_CARE_PROVIDER_SITE_OTHER): Payer: Self-pay | Admitting: Physical Medicine and Rehabilitation

## 2018-04-26 ENCOUNTER — Encounter

## 2018-04-26 VITALS — BP 144/88 | HR 57

## 2018-04-26 DIAGNOSIS — G90522 Complex regional pain syndrome I of left lower limb: Secondary | ICD-10-CM | POA: Diagnosis not present

## 2018-04-26 DIAGNOSIS — R42 Dizziness and giddiness: Secondary | ICD-10-CM | POA: Insufficient documentation

## 2018-04-26 DIAGNOSIS — M5416 Radiculopathy, lumbar region: Secondary | ICD-10-CM | POA: Diagnosis not present

## 2018-04-26 DIAGNOSIS — H90A32 Mixed conductive and sensorineural hearing loss, unilateral, left ear with restricted hearing on the contralateral side: Secondary | ICD-10-CM | POA: Insufficient documentation

## 2018-04-26 DIAGNOSIS — H6502 Acute serous otitis media, left ear: Secondary | ICD-10-CM | POA: Insufficient documentation

## 2018-04-26 DIAGNOSIS — H9202 Otalgia, left ear: Secondary | ICD-10-CM | POA: Insufficient documentation

## 2018-04-26 MED ORDER — DEXAMETHASONE SODIUM PHOSPHATE 10 MG/ML IJ SOLN
15.0000 mg | Freq: Once | INTRAMUSCULAR | Status: AC
  Administered 2018-04-26: 15 mg

## 2018-04-26 MED ORDER — BUPIVACAINE HCL 0.5 % IJ SOLN
3.0000 mL | Freq: Once | INTRAMUSCULAR | Status: AC
  Administered 2018-04-26: 3 mL

## 2018-04-26 NOTE — Patient Instructions (Signed)

## 2018-04-26 NOTE — Progress Notes (Signed)
Natalie Bond - 62 y.o. female MRN [id number]  Date of birth: 1956/02/16  Office Visit Note: Visit Date: 04/26/2018 PCP: Leanna Battles, MD Referred by: Leanna Battles, MD  Subjective: Chief Complaint  Patient presents with  . Lower Back - Pain  . Left Leg - Pain  . Right Foot - Numbness, Tingling   HPI: Natalie Bond is a 62 year old female with chronic pain syndrome and multiple areas of musculoskeletal nerve complaints.  She comes in today for planned left-sided lumbar sympathetic block for what is felt to be some level of complex regional pain syndrome type I.  She gets a lot of swelling in the left leg which is been thoroughly evaluated by vascular surgery and orthopedics.  She has had prior discectomy at L5-S1 but continued level of S1 nerve root scar tissue and fibrosis.  S1 transforaminal injections have helped her back pain but really do not help the leg and swelling is much.  She is starting to get a burning sensation on the left foot as well.  She is been getting some pain and tingling in the right foot.  She has seen a foot and ankle specialist and has been given exercises to do but does not feel like that helped.  She has had prior lumbar sympathetic block with relief of the swelling and burning.  Consideration should be given to spinal cord stimulator trial and implant.   ROS Otherwise per HPI.  Assessment & Plan: Visit Diagnoses:  1. Complex regional pain syndrome i of left lower limb   2. Lumbar radiculopathy     Plan: No additional findings.   Meds & Orders:  Meds ordered this encounter  Medications  . bupivacaine (MARCAINE) 0.5 % (with pres) injection 3 mL  . dexamethasone (DECADRON) injection 15 mg    Orders Placed This Encounter  Procedures  . Nerve Block  . XR C-ARM NO REPORT    Follow-up: No follow-ups on file.   Procedures: No procedures performed  Lumbar Sympathetic Block with Fluoroscopic Guidance  [name]      Date of Birth:  1956/07/05 MRN: [id number] PCP: Leanna Battles, MD      Visit Date: 04/26/2018   Universal Protocol:    Date/Time: 04/27/1911:36 PM  Consent Given By: the patient  Position: PRONE  Additional Comments: Vital signs were monitored before and after the procedure. Patient was prepped and draped in the usual sterile fashion. The correct patient, procedure, and site was verified.   Injection Procedure Details:  Procedure Site One Meds Administered:  Meds ordered this encounter  Medications  . bupivacaine (MARCAINE) 0.5 % (with pres) injection 3 mL  . dexamethasone (DECADRON) injection 15 mg     Laterality: Left  Location/Site:  L2-L3  Needle size: 22 G  Needle type: spinal  Needle Placement: Lateral vertebral body  Findings:  -Comments: There was initial flow of contrast that seem to be a little bit of a muscular spread likely from the iliopsoas as and when with further movement of the medial we did get good flow of contrast medially and on the end of the vertebral body.  Patient did get increase in temperature of the left lower extremity post injection confirming sympathetic block  Procedure Details: The fluoroscope beam was manipulated to square off the endplates of the L2 vertebral body to achieve a true midline AP view.  An oblique view of the region overlying the inferior portion of the L2 vertebral body with the ipsilateral tip of  the transverse process aligned with the ventral edge of the vertebral body was obtained under fluoroscopic visualization. The target is the outer edge of the lower third of the L2 vertebral body.  For the target described the skin was anesthetized with 1 ml of 1% Lidocaine without epinephrine. The spinal needle was inserted down to the inferior anterolateral aspect of the L2 vertebral body using biplanar imaging.   A 73ml volume of Omnipaque-240 was injected to make sure that the needle tip was not in a blood vessel or intra muscular and a  standard fascial plane outline was obtained. Bi-planar imaging confirmed placement and appropriate images documented.  The solution noted above was injected in this region slowly in 1 ml aliquots.    Additional Comments:  The patient tolerated the procedure well Dressing: Band-Aid    Post-procedure details: Patient was observed during the procedure. Post-procedure instructions were reviewed.  Patient left the clinic in stable condition.   Clinical History: L3-4: Diffuse annular bulge and moderate facet disease with mild bilateral lateral recess encroachment but no spinal or foraminal stenosis. There is a small synovial cyst on the right side which is new but no significant mass effect.  L4-5: Mild stable annular bulge and moderate facet disease contributing to mild lateral recess encroachment bilaterally. Possible irritation of the L5 nerve roots, left greater than right. No focal disc protrusion, significant spinal or foraminal stenosis. This level appears relatively stable.  L5-S1: Stable disc disease and facet disease and remote postsurgical changes. Mild desiccation and retraction of the central disc protrusion. No direct mass effect on the S1 nerve roots. The exiting L5 nerve roots appear normal.  IMPRESSION: Overall stable MR examination of the lumbar spine. No significant change since 05/16/2014.  Stable shallow broad-based foraminal and extra foraminal disc protrusion on the left at L2-3 with possible mild impingement on the left L3 nerve root in the lateral recess. No definite mass effect on the exiting left L2 nerve root.  Stable mild bilateral lateral recess encroachment at L3-4. There is a new small synovial cyst on the right side but no mass effect.  Stable multifactorial mild lateral recess encroachment bilaterally at L4-5. Possible irritation of the left L5 nerve roots, left greater than right.  Stable disc disease and facet disease at L5-S1. Mild  desiccation and retraction of the central disc protrusion.   Electronically Signed   By: Marijo Sanes M.D.   On: 12/19/2015 10:37   She reports that she has never smoked. She has never used smokeless tobacco. No results for input(s): HGBA1C, LABURIC in the last 8760 hours.  Objective:  VS:  HT:    WT:   BMI:     BP:(!) 144/88  HR:(!) 57bpm  TEMP: ( )  RESP:  Physical Exam  Ortho Exam Imaging: Xr C-arm No Report  Result Date: 04/26/2018 Please see Notes or Procedures tab for imaging impression.   Past Medical/Family/Surgical/Social History: Medications & Allergies reviewed per EMR, new medications updated. Patient Active Problem List   Diagnosis Date Noted  . Venous stasis dermatitis of left lower extremity 04/09/2018  . Achilles tendon contracture, left 04/09/2018  . Pain in left ankle and joints of left foot 04/09/2018  . Primary osteoarthritis of first carpometacarpal joint of right hand 10/09/2017  . Bilateral hand pain 10/05/2017  . Primary osteoarthritis of both first carpometacarpal joints 10/05/2017  . Trochanteric bursitis, right hip 06/29/2017  . Degenerative disc disease at L5-S1 level 05/04/2017  . History of joint replacement 05/04/2017  .  Claw toe, acquired, right 04/27/2017  . Achilles tendon contracture, right 04/27/2017  . Pain in right foot 04/11/2017  . Status post left hip replacement 10/21/2016  . Unilateral primary osteoarthritis, left hip 09/23/2016    Class: Chronic  . Chronic back pain 01/14/2016  . Migraine 01/14/2016  . Essential hypertension 01/22/2015   Past Medical History:  Diagnosis Date  . Arthritis   . Cancer ([place])    skin cancer  . Complication of anesthesia   . GERD (gastroesophageal reflux disease)   . Headache   . High cholesterol   . History of bronchitis   . History of kidney stones   . Hypertension   . PONV (postoperative nausea and vomiting)   . Shortness of breath dyspnea   . Sleep apnea    pt. does not use a  CPAP or BiPAP  . Stroke ([place])    Pt. states possible mini stroke in 2014  . Tuberculosis    Family History  Problem Relation Age of Onset  . Cancer Mother   . Diabetes Mother   . Heart disease Mother   . Hyperlipidemia Mother   . Hypertension Mother   . Cancer Father   . Diabetes Father   . Heart disease Father   . Hyperlipidemia Father   . Hypertension Father   . Varicose Veins Father   . Cancer Sister   . Heart disease Sister   . Hyperlipidemia Sister   . Cancer Brother   . Diabetes Brother   . Hyperlipidemia Brother    Past Surgical History:  Procedure Laterality Date  . ABDOMINAL HYSTERECTOMY    . BACK SURGERY    . CARDIAC CATHETERIZATION     01/27/15 [facility]): EF 60%, normal coronaries.  Marland Kitchen CATARACT EXTRACTION, BILATERAL    . CHOLECYSTECTOMY  07/2016   done [place]  . EYE SURGERY    . HERNIA REPAIR Left 1990   left groin   . LOWER EXTREMITY ANGIOGRAM Left 06/08/2015   Procedure: Ascending Venogram  Iliac Vein ;  Surgeon: Elam Dutch, MD;  Location: [facility] OR;  Service: Vascular;  Laterality: Left;  ;  TARA- [facility] SCIENTIFIC / ZACH-[place] HAVE BEEN NOTIFIED/ CONFIRMED  . PARTIAL HYSTERECTOMY    . SHOULDER SURGERY    . TOTAL HIP ARTHROPLASTY Left 10/21/2016   Procedure: LEFT TOTAL HIP ARTHROPLASTY ANTERIOR APPROACH;  Surgeon: Mcarthur Rossetti, MD;  Location: [facility] ORS;  Service: Orthopedics;  Laterality: Left;  . WRIST SURGERY Bilateral    carpal tunnel   Social History   Occupational History  . 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 Smoking status: Never Smoker  . Smokeless tobacco: Never Used  Substance and Sexual Activity  . Alcohol use: No  . Drug use: No  . Sexual activity: Not on file

## 2018-04-26 NOTE — Progress Notes (Signed)
 .  Numeric Pain Rating Scale and Functional Assessment Average Pain 8   In the last MONTH (on 0-10 scale) has pain interfered with the following?  1. General activity like being  able to carry out your everyday physical activities such as walking, climbing stairs, carrying groceries, or moving a chair?  Rating(7)   +Driver, -BT, -Dye Allergies.  

## 2018-04-26 NOTE — Procedures (Signed)
Lumbar Sympathetic Block with Fluoroscopic Guidance  [name]      Date of Birth: 1956-08-29 MRN: [id number] PCP: Leanna Battles, MD      Visit Date: 04/26/2018   Universal Protocol:    Date/Time: 04/27/1911:36 PM  Consent Given By: the patient  Position: PRONE  Additional Comments: Vital signs were monitored before and after the procedure. Patient was prepped and draped in the usual sterile fashion. The correct patient, procedure, and site was verified.   Injection Procedure Details:  Procedure Site One Meds Administered:  Meds ordered this encounter  Medications  . bupivacaine (MARCAINE) 0.5 % (with pres) injection 3 mL  . dexamethasone (DECADRON) injection 15 mg     Laterality: Left  Location/Site:  L2-L3  Needle size: 22 G  Needle type: spinal  Needle Placement: Lateral vertebral body  Findings:  -Comments: There was initial flow of contrast that seem to be a little bit of a muscular spread likely from the iliopsoas as and when with further movement of the medial we did get good flow of contrast medially and on the end of the vertebral body.  Patient did get increase in temperature of the left lower extremity post injection confirming sympathetic block  Procedure Details: The fluoroscope beam was manipulated to square off the endplates of the L2 vertebral body to achieve a true midline AP view.  An oblique view of the region overlying the inferior portion of the L2 vertebral body with the ipsilateral tip of the transverse process aligned with the ventral edge of the vertebral body was obtained under fluoroscopic visualization. The target is the outer edge of the lower third of the L2 vertebral body.  For the target described the skin was anesthetized with 1 ml of 1% Lidocaine without epinephrine. The spinal needle was inserted down to the inferior anterolateral aspect of the L2 vertebral body using biplanar imaging.   A 52ml volume of Omnipaque-240  was injected to make sure that the needle tip was not in a blood vessel or intra muscular and a standard fascial plane outline was obtained. Bi-planar imaging confirmed placement and appropriate images documented.  The solution noted above was injected in this region slowly in 1 ml aliquots.    Additional Comments:  The patient tolerated the procedure well Dressing: Band-Aid    Post-procedure details: Patient was observed during the procedure. Post-procedure instructions were reviewed.  Patient left the clinic in stable condition.

## 2018-05-10 ENCOUNTER — Ambulatory Visit (INDEPENDENT_AMBULATORY_CARE_PROVIDER_SITE_OTHER): Payer: 59 | Admitting: Orthopedic Surgery

## 2018-07-19 ENCOUNTER — Telehealth (INDEPENDENT_AMBULATORY_CARE_PROVIDER_SITE_OTHER): Payer: Self-pay | Admitting: Physical Medicine and Rehabilitation

## 2018-07-19 NOTE — Telephone Encounter (Signed)
She saw Dr. Sharol Given in May, she has had this swelling that has had full vascular workup in past. Venous stasis etc. She swears in the past that S1 trans esi and also lumbar sympathetic block decreased swelling and pain. I am not sure what is causing the swelling but if weeping and sores she needs eval by Dr. Sharol Given.

## 2018-07-19 NOTE — Telephone Encounter (Signed)
Please see message below. Can you please make appt for eval with Dr. Sharol Given next week.

## 2018-07-19 NOTE — Telephone Encounter (Signed)
If it helped ok

## 2018-07-19 NOTE — Telephone Encounter (Signed)
Patient states that last block did not help. Reports that she is getting "sore" places on bottom of foot and that her leg is starting to "weep."

## 2018-07-19 NOTE — Telephone Encounter (Signed)
I called the patient and advised her that she needs to see Dr. Sharol Given about the weeping in her leg. She said she will not see him because nothing he does helps. She states that she wants what will help. I advised that she had already stated that the last sympathetic block did not help. She again stated that it did not and asked if there was another medicine that Dr. Ernestina Patches could inject. I advised her that there was not and that she needed to see someone about the weeping in her leg. She stated again that she does not want to see Dr. Sharol Given and that she will just wait until "something happens" and go to the emergency room. I advised her that she needs to be seen and asked if there was a vascular surgeon that she has seen. She again stated that she does not want to see Dr. Sharol Given and asked what to do. I advised her that her options were Dr. Sharol Given, another physician outside our practice who specializes in this, or the hospital if that was where she wanted to go.

## 2018-07-20 NOTE — Telephone Encounter (Signed)
Noted  

## 2018-08-28 DIAGNOSIS — M25519 Pain in unspecified shoulder: Secondary | ICD-10-CM | POA: Insufficient documentation

## 2018-10-22 DIAGNOSIS — R600 Localized edema: Secondary | ICD-10-CM | POA: Insufficient documentation

## 2018-10-30 ENCOUNTER — Ambulatory Visit (INDEPENDENT_AMBULATORY_CARE_PROVIDER_SITE_OTHER): Payer: 59

## 2018-10-30 ENCOUNTER — Encounter (INDEPENDENT_AMBULATORY_CARE_PROVIDER_SITE_OTHER): Payer: Self-pay | Admitting: Orthopaedic Surgery

## 2018-10-30 ENCOUNTER — Ambulatory Visit (INDEPENDENT_AMBULATORY_CARE_PROVIDER_SITE_OTHER): Payer: 59 | Admitting: Orthopaedic Surgery

## 2018-10-30 DIAGNOSIS — M1811 Unilateral primary osteoarthritis of first carpometacarpal joint, right hand: Secondary | ICD-10-CM | POA: Diagnosis not present

## 2018-10-30 NOTE — Progress Notes (Signed)
Office Visit Note   Patient: ARIONNA HOGGARD           Date of Birth: [birth year]           MRN: [id number] Visit Date: 10/30/2018              Requested by: Leanna Battles, MD [place], [place] [address] PCP: Leanna Battles, MD   Assessment & Plan: Visit Diagnoses:  1. Primary osteoarthritis of first carpometacarpal joint of right hand     Plan: Impression is first [facility] joint osteoarthritis right hand.  At this point, the patient is failed conservative treatment options and would like to proceed with definitive treatment to include [facility] arthroplasty.  Risks, benefits possible occasions reviewed.  Rehab recovery time discussed.  All questions were answered.  Follow-Up Instructions: Return for 2 weeks post-op.   Orders:  Orders Placed This Encounter  Procedures  . XR Hand Complete Right   No orders of the defined types were placed in this encounter.     Procedures: No procedures performed   Clinical Data: No additional findings.   Subjective: Chief Complaint  Patient presents with  . Right Wrist - Pain, Follow-up    HPI patient is a pleasant 62 year old female presents to our clinic today with recurrent right thumb and wrist pain.  History of first [facility] joint osteoarthritis on the right.  This is been ongoing for quite some time and is significantly worsened.  She has tried oral and topical anti-inflammatories as well as bracing with minimal to no relief of symptoms.  She works cutting cough for living.  This seems to aggravate her symptoms.  At this point, she is ready for surgical intervention.  Review of Systems as detailed HPI.  All others are negative.   Objective: Vital Signs: There were no vitals taken for this visit.  Physical Exam well-developed well-nourished female no acute distress.  Alert and oriented  Ortho Exam stable exam of the right hand  Specialty Comments:  No specialty comments available.  Imaging: Xr Hand Complete  Right  Result Date: 10/30/2018 Stable advanced degenerative joint disease of thumb [facility] joint with slight radial subluxation of the thumb metacarpal.    PMFS History: Patient Active Problem List   Diagnosis Date Noted  . Venous stasis dermatitis of left lower extremity 04/09/2018  . Achilles tendon contracture, left 04/09/2018  . Pain in left ankle and joints of left foot 04/09/2018  . Primary osteoarthritis of first carpometacarpal joint of right hand 10/09/2017  . Bilateral hand pain 10/05/2017  . Primary osteoarthritis of both first carpometacarpal joints 10/05/2017  . Trochanteric bursitis, right hip 06/29/2017  . Degenerative disc disease at L5-S1 level 05/04/2017  . History of joint replacement 05/04/2017  . Claw toe, acquired, right 04/27/2017  . Achilles tendon contracture, right 04/27/2017  . Pain in right foot 04/11/2017  . Status post left hip replacement 10/21/2016  . Unilateral primary osteoarthritis, left hip 09/23/2016    Class: Chronic  . Chronic back pain 01/14/2016  . Migraine 01/14/2016  . Essential hypertension 01/22/2015   Past Medical History:  Diagnosis Date  . Arthritis   . Cancer ([place])    skin cancer  . Complication of anesthesia   . GERD (gastroesophageal reflux disease)   . Headache   . High cholesterol   . History of bronchitis   . History of kidney stones   . Hypertension   . PONV (postoperative nausea and vomiting)   . Shortness of breath  dyspnea   . Sleep apnea    pt. does not use a CPAP or BiPAP  . Stroke ([place])    Pt. states possible mini stroke in 2014  . Tuberculosis     Family History  Problem Relation Age of Onset  . Cancer Mother   . Diabetes Mother   . Heart disease Mother   . Hyperlipidemia Mother   . Hypertension Mother   . Cancer Father   . Diabetes Father   . Heart disease Father   . Hyperlipidemia Father   . Hypertension Father   . Varicose Veins Father   . Cancer Sister   . Heart disease Sister   .  Hyperlipidemia Sister   . Cancer Brother   . Diabetes Brother   . Hyperlipidemia Brother     Past Surgical History:  Procedure Laterality Date  . ABDOMINAL HYSTERECTOMY    . BACK SURGERY    . CARDIAC CATHETERIZATION     01/27/15 [facility]): EF 60%, normal coronaries.  Marland Kitchen CATARACT EXTRACTION, BILATERAL    . CHOLECYSTECTOMY  07/2016   done [place]  . EYE SURGERY    . HERNIA REPAIR Left 1990   left groin   . LOWER EXTREMITY ANGIOGRAM Left 06/08/2015   Procedure: Ascending Venogram  Iliac Vein ;  Surgeon: Elam Dutch, MD;  Location: [facility] OR;  Service: Vascular;  Laterality: Left;  ;  TARA- [facility] / ZACH-[facility] HAVE BEEN NOTIFIED/ CONFIRMED  . PARTIAL HYSTERECTOMY    . SHOULDER SURGERY    . TOTAL HIP ARTHROPLASTY Left 10/21/2016   Procedure: LEFT TOTAL HIP ARTHROPLASTY ANTERIOR APPROACH;  Surgeon: Mcarthur Rossetti, MD;  Location: [facility] ORS;  Service: Orthopedics;  Laterality: Left;  . WRIST SURGERY Bilateral    carpal tunnel   Social History   Occupational History  . Occupation: [employer] Therapist, art: [employer]  Tobacco Use  . Smoking status: Never Smoker  . Smokeless tobacco: Never Used  Substance and Sexual Activity  . Alcohol use: No  . Drug use: No  . Sexual activity: Not on file

## 2018-10-31 ENCOUNTER — Telehealth (INDEPENDENT_AMBULATORY_CARE_PROVIDER_SITE_OTHER): Payer: Self-pay | Admitting: Orthopaedic Surgery

## 2018-10-31 ENCOUNTER — Encounter (INDEPENDENT_AMBULATORY_CARE_PROVIDER_SITE_OTHER): Payer: Self-pay

## 2018-10-31 NOTE — Telephone Encounter (Signed)
Patient called asked if a note can be faxed to her employer stating she saw the doctor yesterday. The fax# is [phone]  Attn: Royal Hawthorn  The number to contact patient is [phone]

## 2018-10-31 NOTE — Telephone Encounter (Signed)
faxed

## 2018-11-13 ENCOUNTER — Ambulatory Visit (INDEPENDENT_AMBULATORY_CARE_PROVIDER_SITE_OTHER): Payer: 59 | Admitting: Physical Medicine and Rehabilitation

## 2018-11-13 ENCOUNTER — Ambulatory Visit (INDEPENDENT_AMBULATORY_CARE_PROVIDER_SITE_OTHER): Payer: 59

## 2018-11-13 ENCOUNTER — Encounter (INDEPENDENT_AMBULATORY_CARE_PROVIDER_SITE_OTHER): Payer: Self-pay | Admitting: Physical Medicine and Rehabilitation

## 2018-11-13 VITALS — BP 149/98 | HR 73 | Ht 67.0 in | Wt 180.0 lb

## 2018-11-13 DIAGNOSIS — M47816 Spondylosis without myelopathy or radiculopathy, lumbar region: Secondary | ICD-10-CM

## 2018-11-13 DIAGNOSIS — M5416 Radiculopathy, lumbar region: Secondary | ICD-10-CM | POA: Diagnosis not present

## 2018-11-13 DIAGNOSIS — M7061 Trochanteric bursitis, right hip: Secondary | ICD-10-CM

## 2018-11-13 DIAGNOSIS — M5136 Other intervertebral disc degeneration, lumbar region: Secondary | ICD-10-CM

## 2018-11-13 DIAGNOSIS — M961 Postlaminectomy syndrome, not elsewhere classified: Secondary | ICD-10-CM

## 2018-11-13 DIAGNOSIS — M25551 Pain in right hip: Secondary | ICD-10-CM

## 2018-11-13 NOTE — Progress Notes (Signed)
 .  Numeric Pain Rating Scale and Functional Assessment Average Pain 8 Pain Right Now 6 My pain is constant, sharp and aching Pain is worse with: some activites Pain improves with: medication   In the last MONTH (on 0-10 scale) has pain interfered with the following?  1. General activity like being  able to carry out your everyday physical activities such as walking, climbing stairs, carrying groceries, or moving a chair?  Rating(7)  2. Relation with others like being able to carry out your usual social activities and roles such as  activities at home, at work and in your community. Rating(7)  3. Enjoyment of life such that you have  been bothered by emotional problems such as feeling anxious, depressed or irritable?  Rating(2)

## 2018-11-13 NOTE — Progress Notes (Signed)
[name] - 62 y.o. female MRN [id number]  Date of birth: Mar 26, 1956  Office Visit Note: Visit Date: 11/13/2018 PCP: Leanna Battles, MD Referred by: Leanna Battles, MD  Subjective: Chief Complaint  Patient presents with  . Lower Back - Pain  . Right Leg - Pain  . Right Hip - Pain  . Right Great Toe - Numbness   HPI: Natalie Bond is a 62 y.o. female who comes in today For reevaluation of low back pain in particular right hip and groin pain that is been worsening over the last several months.  She really reports last injection which was then maybe helps gave her about 60% relief but then it seemed to return a month after the injection and she has been dealing with some other issues.  She has been followed by just about every Dr. in the office but most recently by Dr. Eduard Roux.  She has seen Dr. Louanne Skye in the past for her back.  In terms of our history with her she has had this interesting left hip and leg pain predominantly with swelling that seems to be alleviated with lumbar sympathetic block or epidural injection.  She has undergone significant amounts of swelling in the limbs over time is actually seen Dr. Sharol Given for this as well.  Her biggest complaint right now is worsening right hip and groin pain with some radiating symptoms down the leg and also some numbness in the right great toe more of an L5 distribution.  No specific new trauma.  She does do pretty physical work and this seems to exacerbate all of her symptoms.  Standing and walking and working do seem to make it worse.  She gets some relief with rest.  She is using gabapentin and Flexeril with some relief.  Last MRI of the lumbar spine was in 2017.  She has had no focal weakness but feels weak at times.  Has not had any foot dragging or falls but does feel weak at times.  No bowel or bladder changes again no specific trauma.  Review of Systems  Constitutional: Negative for chills, fever, malaise/fatigue and weight loss.    HENT: Negative for hearing loss and sinus pain.   Eyes: Negative for blurred vision, double vision and photophobia.  Respiratory: Negative for cough and shortness of breath.   Cardiovascular: Negative for chest pain, palpitations and leg swelling.  Gastrointestinal: Negative for abdominal pain, nausea and vomiting.  Genitourinary: Negative for flank pain.  Musculoskeletal: Positive for back pain and joint pain. Negative for myalgias.  Skin: Negative for itching and rash.  Neurological: Positive for tingling. Negative for tremors, focal weakness and weakness.  Endo/Heme/Allergies: Negative.   Psychiatric/Behavioral: Negative for depression.  All other systems reviewed and are negative.  Otherwise per HPI.  Assessment & Plan: Visit Diagnoses:  1. Lumbar radiculopathy   2. Post laminectomy syndrome   3. Pain in right hip   4. Other intervertebral disc degeneration, lumbar region   5. Trochanteric bursitis, right hip   6. Spondylosis without myelopathy or radiculopathy, lumbar region     Plan: Findings:  Chronic severe history of low back pain usually more left radicular pain history of lumbar disc herniation the next partially resolved over time.  Now with a right hip and groin pain which Dr. Erlinda Hong feels is not related to her hip.  This could be consistent with greater trochanteric bursitis versus gluteus medius tendinitis.  We did provide a greater trochanteric injection with fluoroscopic guidance  through the body habitus.  I also think new MRI is in order for her lumbar spine to see if this is some new issue at the L4-5 level involving the facet joint in particular may be new disc herniation.  Should continue on current medications and activity modification.  We will see how she does with a greater trochanteric injection.  We talked about foam rollers and other treatments for tendinitis.    Meds & Orders: No orders of the defined types were placed in this encounter.   Orders Placed This  Encounter  Procedures  . Large Joint Inj: R greater trochanter  . MR LUMBAR SPINE WO CONTRAST  . XR C-ARM NO REPORT    Follow-up: Return for MRI review after completion.   Procedures: Large Joint Inj: R greater trochanter on 11/13/2018 9:38 AM Indications: pain and diagnostic evaluation Details: 22 G 3.5 in needle, fluoroscopy-guided lateral approach  Arthrogram: No  Medications: 4 mL lidocaine 2 %; 80 mg triamcinolone acetonide 40 MG/ML; 4 mL bupivacaine 0.25 % Outcome: tolerated well, no immediate complications  There was excellent flow of contrast outlined the greater trochanteric bursa without vascular uptake. Procedure, treatment alternatives, risks and benefits explained, specific risks discussed. Consent was given by the patient. Immediately prior to procedure a time out was called to verify the correct patient, procedure, equipment, support staff and site/side marked as required. Patient was prepped and draped in the usual sterile fashion.      No notes on file   Clinical History: L3-4: Diffuse annular bulge and moderate facet disease with mild bilateral lateral recess encroachment but no spinal or foraminal stenosis. There is a small synovial cyst on the right side which is new but no significant mass effect.  L4-5: Mild stable annular bulge and moderate facet disease contributing to mild lateral recess encroachment bilaterally. Possible irritation of the L5 nerve roots, left greater than right. No focal disc protrusion, significant spinal or foraminal stenosis. This level appears relatively stable.  L5-S1: Stable disc disease and facet disease and remote postsurgical changes. Mild desiccation and retraction of the central disc protrusion. No direct mass effect on the S1 nerve roots. The exiting L5 nerve roots appear normal.  IMPRESSION: Overall stable MR examination of the lumbar spine. No significant change since 05/16/2014.  Stable shallow broad-based  foraminal and extra foraminal disc protrusion on the left at L2-3 with possible mild impingement on the left L3 nerve root in the lateral recess. No definite mass effect on the exiting left L2 nerve root.  Stable mild bilateral lateral recess encroachment at L3-4. There is a new small synovial cyst on the right side but no mass effect.  Stable multifactorial mild lateral recess encroachment bilaterally at L4-5. Possible irritation of the left L5 nerve roots, left greater than right.  Stable disc disease and facet disease at L5-S1. Mild desiccation and retraction of the central disc protrusion.   Electronically Signed   By: Marijo Sanes M.D.   On: 12/19/2015 10:37   She reports that she has never smoked. She has never used smokeless tobacco. No results for input(s): HGBA1C, LABURIC in the last 8760 hours.  Objective:  VS:  HT:5\' 7"  (170.2 cm)   WT:180 lb (81.6 kg)  BMI:28.19    BP:(!) 149/98  HR:73bpm  TEMP: ( )  RESP:96 % Physical Exam Vitals signs and nursing note reviewed.  Constitutional:      General: She is not in acute distress.    Appearance: Normal appearance. She is  well-developed.  HENT:     Head: Normocephalic and atraumatic.     Nose: Nose normal.     Mouth/Throat:     Mouth: Mucous membranes are moist.     Pharynx: Oropharynx is clear.  Eyes:     Conjunctiva/sclera: Conjunctivae normal.     Pupils: Pupils are equal, round, and reactive to light.  Neck:     Musculoskeletal: Normal range of motion and neck supple.  Cardiovascular:     Rate and Rhythm: Regular rhythm.  Pulmonary:     Effort: Pulmonary effort is normal. No respiratory distress.  Abdominal:     General: There is no distension.     Palpations: Abdomen is soft.     Tenderness: There is no guarding.  Musculoskeletal:     Right lower leg: No edema.     Left lower leg: No edema.     Comments: Patient ambulates without aid.  She does have pain with extension rotation facet loading on  the right more than left of the lumbar spine.  No pain with hip rotation.  She does have tenderness over the right greater trochanter.  She has pretty exquisite tenderness actually.  She has no pain with internal rotation of the hip.  She has good distal strength.  There is edema bilaterally although doing better than she has in the past.  No clonus.  Skin:    General: Skin is warm and dry.     Findings: No erythema or rash.  Neurological:     General: No focal deficit present.     Mental Status: She is alert and oriented to person, place, and time.     Motor: No abnormal muscle tone.     Coordination: Coordination normal.     Gait: Gait normal.  Psychiatric:        Mood and Affect: Mood normal.        Behavior: Behavior normal.        Thought Content: Thought content normal.     Ortho Exam Imaging: No results found.  Past Medical/Family/Surgical/Social History: Medications & Allergies reviewed per EMR, new medications updated. Patient Active Problem List   Diagnosis Date Noted  . Mixed conductive and sensorineural hearing loss of left ear with restricted hearing of right ear 04/26/2018  . Vertigo 04/26/2018  . Venous stasis dermatitis of left lower extremity 04/09/2018  . Achilles tendon contracture, left 04/09/2018  . Pain in left ankle and joints of left foot 04/09/2018  . Primary osteoarthritis of first carpometacarpal joint of right hand 10/09/2017  . Bilateral hand pain 10/05/2017  . Primary osteoarthritis of both first carpometacarpal joints 10/05/2017  . Trochanteric bursitis, right hip 06/29/2017  . Degenerative disc disease at L5-S1 level 05/04/2017  . History of joint replacement 05/04/2017  . Claw toe, acquired, right 04/27/2017  . Achilles tendon contracture, right 04/27/2017  . Pain in right foot 04/11/2017  . Status post left hip replacement 10/21/2016  . Unilateral primary osteoarthritis, left hip 09/23/2016    Class: Chronic  . Chronic back pain 01/14/2016    . Migraine 01/14/2016  . Essential hypertension 01/22/2015   Past Medical History:  Diagnosis Date  . Arthritis    back, wrists  . Cancer ([place])    skin cancer  . Complication of anesthesia   . Diabetes mellitus without complication ([place])    pt recently started on metformin  . GERD (gastroesophageal reflux disease)   . Headache   . High cholesterol   . History  of bronchitis   . History of kidney stones    2015  . Hypertension   . PONV (postoperative nausea and vomiting)   . Sleep apnea    pt. does not use a CPAP or BiPAP  . Stroke ([place])    Pt. states possible mini stroke in 2014, no deficits   Family History  Problem Relation Age of Onset  . Cancer Mother   . Diabetes Mother   . Heart disease Mother   . Hyperlipidemia Mother   . Hypertension Mother   . Cancer Father   . Diabetes Father   . Heart disease Father   . Hyperlipidemia Father   . Hypertension Father   . Varicose Veins Father   . Cancer Sister   . Heart disease Sister   . Hyperlipidemia Sister   . Cancer Brother   . Diabetes Brother   . Hyperlipidemia Brother    Past Surgical History:  Procedure Laterality Date  . ABDOMINAL HYSTERECTOMY    . BACK SURGERY    . CARDIAC CATHETERIZATION     01/27/15 [facility]): EF 60%, normal coronaries.  Mortimer Fries SUSPENSION PLASTY Right 12/12/2018   Procedure: RIGHT THUMB CARPOMETACARPAL ([place]) ARTHROPLASTY;  Surgeon: Leandrew Koyanagi, MD;  Location: [place];  Service: Orthopedics;  Laterality: Right;  . CATARACT EXTRACTION, BILATERAL    . CHOLECYSTECTOMY  07/2016   done [place]  . EYE SURGERY    . HERNIA REPAIR Left 1990   left groin   . LOWER EXTREMITY ANGIOGRAM Left 06/08/2015   Procedure: Ascending Venogram  Iliac Vein ;  Surgeon: Elam Dutch, MD;  Location: [facility] OR;  Service: Vascular;  Laterality: Left;  ;  TARA- [facility] / ZACH-[facility] HAVE BEEN NOTIFIED/ CONFIRMED  . PARTIAL HYSTERECTOMY    . SHOULDER SURGERY    . TOTAL HIP  ARTHROPLASTY Left 10/21/2016   Procedure: LEFT TOTAL HIP ARTHROPLASTY ANTERIOR APPROACH;  Surgeon: Mcarthur Rossetti, MD;  Location: [facility];  Service: Orthopedics;  Laterality: Left;  . WRIST SURGERY Bilateral    carpal tunnel   Social History   Occupational History  . 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 Smoking status: Never Smoker  . Smokeless tobacco: Never Used  Substance and Sexual Activity  . Alcohol use: No  . Drug use: No  . Sexual activity: Not on file

## 2018-12-01 ENCOUNTER — Ambulatory Visit
Admission: RE | Admit: 2018-12-01 | Discharge: 2018-12-01 | Disposition: A | Discharge status: Home / Self Care | Payer: Self-pay | Source: Ambulatory Visit | Attending: Physical Medicine and Rehabilitation | Admitting: Physical Medicine and Rehabilitation

## 2018-12-01 DIAGNOSIS — M5136 Other intervertebral disc degeneration, lumbar region: Secondary | ICD-10-CM

## 2018-12-03 ENCOUNTER — Telehealth (INDEPENDENT_AMBULATORY_CARE_PROVIDER_SITE_OTHER): Payer: Self-pay | Admitting: Physical Medicine and Rehabilitation

## 2018-12-04 NOTE — Telephone Encounter (Signed)
Member EQ:[id number] Name:Corsi, WILMADate of Birth:1957/10/21Gender:FEMALEAddress:[address], [place], [place], [place] HEALTHCAREProgram:GWHCIGNA-NO PA REQUIREDProgram Effective Date:2/1/2019Program Term Date:12/31/2999NOTE: This customer does not require authorization through [employer] at this time. Please contact the number on the back of the customer's ID card for more information. Thank you.    Pt is scheduled for 12/17/2018 with driver.

## 2018-12-05 ENCOUNTER — Encounter (HOSPITAL_BASED_OUTPATIENT_CLINIC_OR_DEPARTMENT_OTHER): Payer: Self-pay

## 2018-12-05 ENCOUNTER — Other Ambulatory Visit: Payer: Self-pay

## 2018-12-07 ENCOUNTER — Encounter (HOSPITAL_BASED_OUTPATIENT_CLINIC_OR_DEPARTMENT_OTHER)
Admission: RE | Admit: 2018-12-07 | Discharge: 2018-12-07 | Disposition: A | Discharge status: Home / Self Care | Payer: 59 | Source: Ambulatory Visit | Attending: Orthopaedic Surgery | Admitting: Orthopaedic Surgery

## 2018-12-07 DIAGNOSIS — Z01818 Encounter for other preprocedural examination: Secondary | ICD-10-CM | POA: Insufficient documentation

## 2018-12-07 LAB — BASIC METABOLIC PANEL
Anion gap: 6 (ref 5–15)
BUN: 12 mg/dL (ref 8–23)
CO2: 29 mmol/L (ref 22–32)
Calcium: 9.3 mg/dL (ref 8.9–10.3)
Chloride: 103 mmol/L (ref 98–111)
Creatinine, Ser: 1.1 mg/dL — ABNORMAL HIGH (ref 0.44–1.00)
GFR calc Af Amer: >60 mL/min (ref >60)
GFR calc non Af Amer: 54 mL/min — ABNORMAL LOW (ref >60)
Glucose, Bld: 95 mg/dL (ref 70–99)
POTASSIUM: 4.3 mmol/L (ref 3.5–5.1)
Sodium: 138 mmol/L (ref 135–145)

## 2018-12-12 ENCOUNTER — Encounter (HOSPITAL_BASED_OUTPATIENT_CLINIC_OR_DEPARTMENT_OTHER): Payer: Self-pay

## 2018-12-12 ENCOUNTER — Ambulatory Visit (HOSPITAL_BASED_OUTPATIENT_CLINIC_OR_DEPARTMENT_OTHER): Payer: 59 | Admitting: Certified Registered"

## 2018-12-12 ENCOUNTER — Encounter (HOSPITAL_BASED_OUTPATIENT_CLINIC_OR_DEPARTMENT_OTHER)
Admission: RE | Disposition: A | Discharge status: Home / Self Care | Payer: Self-pay | Source: Ambulatory Visit | Attending: Orthopaedic Surgery

## 2018-12-12 ENCOUNTER — Ambulatory Visit (HOSPITAL_BASED_OUTPATIENT_CLINIC_OR_DEPARTMENT_OTHER)
Admission: RE | Admit: 2018-12-12 | Discharge: 2018-12-12 | Disposition: A | Discharge status: Home / Self Care | Payer: 59 | Source: Ambulatory Visit | Attending: Orthopaedic Surgery | Admitting: Orthopaedic Surgery

## 2018-12-12 ENCOUNTER — Other Ambulatory Visit: Payer: Self-pay

## 2018-12-12 DIAGNOSIS — Z87442 Personal history of urinary calculi: Secondary | ICD-10-CM | POA: Diagnosis not present

## 2018-12-12 DIAGNOSIS — Z8673 Personal history of transient ischemic attack (TIA), and cerebral infarction without residual deficits: Secondary | ICD-10-CM | POA: Diagnosis not present

## 2018-12-12 DIAGNOSIS — Z791 Long term (current) use of non-steroidal anti-inflammatories (NSAID): Secondary | ICD-10-CM | POA: Insufficient documentation

## 2018-12-12 DIAGNOSIS — E119 Type 2 diabetes mellitus without complications: Secondary | ICD-10-CM | POA: Diagnosis not present

## 2018-12-12 DIAGNOSIS — Z85828 Personal history of other malignant neoplasm of skin: Secondary | ICD-10-CM | POA: Diagnosis not present

## 2018-12-12 DIAGNOSIS — Z7984 Long term (current) use of oral hypoglycemic drugs: Secondary | ICD-10-CM | POA: Insufficient documentation

## 2018-12-12 DIAGNOSIS — I10 Essential (primary) hypertension: Secondary | ICD-10-CM | POA: Diagnosis not present

## 2018-12-12 DIAGNOSIS — M1990 Unspecified osteoarthritis, unspecified site: Secondary | ICD-10-CM | POA: Diagnosis not present

## 2018-12-12 DIAGNOSIS — Z885 Allergy status to narcotic agent status: Secondary | ICD-10-CM | POA: Insufficient documentation

## 2018-12-12 DIAGNOSIS — E7800 Pure hypercholesterolemia, unspecified: Secondary | ICD-10-CM | POA: Insufficient documentation

## 2018-12-12 DIAGNOSIS — Z7982 Long term (current) use of aspirin: Secondary | ICD-10-CM | POA: Diagnosis not present

## 2018-12-12 DIAGNOSIS — Z79899 Other long term (current) drug therapy: Secondary | ICD-10-CM | POA: Insufficient documentation

## 2018-12-12 DIAGNOSIS — K219 Gastro-esophageal reflux disease without esophagitis: Secondary | ICD-10-CM | POA: Diagnosis not present

## 2018-12-12 DIAGNOSIS — G4730 Sleep apnea, unspecified: Secondary | ICD-10-CM | POA: Insufficient documentation

## 2018-12-12 DIAGNOSIS — M1811 Unilateral primary osteoarthritis of first carpometacarpal joint, right hand: Secondary | ICD-10-CM | POA: Diagnosis present

## 2018-12-12 HISTORY — DX: Type 2 diabetes mellitus without complications: E11.9

## 2018-12-12 HISTORY — PX: CARPOMETACARPEL SUSPENSION PLASTY: SHX5005

## 2018-12-12 LAB — GLUCOSE, CAPILLARY
Glucose-Capillary: 96 mg/dL (ref 70–99)
Glucose-Capillary: 79 mg/dL (ref 70–99)

## 2018-12-12 SURGERY — CARPOMETACARPEL (CMC) SUSPENSION PLASTY
Anesthesia: Regional | Site: Hand | Laterality: Right

## 2018-12-12 MED ORDER — FENTANYL CITRATE (PF) 100 MCG/2ML IJ SOLN: 25.0000 ug | INTRAMUSCULAR | Status: DC | PRN

## 2018-12-12 MED ORDER — CLINDAMYCIN PHOSPHATE 900 MG/50ML IV SOLN: 900.0000 mg | INTRAVENOUS | Status: DC

## 2018-12-12 MED ORDER — CLINDAMYCIN PHOSPHATE 900 MG/50ML IV SOLN
INTRAVENOUS | Status: AC
  Filled 2018-12-12: qty 50

## 2018-12-12 MED ORDER — ROPIVACAINE HCL 7.5 MG/ML IJ SOLN
INTRAMUSCULAR | Status: DC | PRN
  Administered 2018-12-12: 40 mL via PERINEURAL

## 2018-12-12 MED ORDER — LACTATED RINGERS IV SOLN
INTRAVENOUS | Status: DC
  Administered 2018-12-12: 09:00:00 via INTRAVENOUS

## 2018-12-12 MED ORDER — SCOPOLAMINE 1 MG/3DAYS TD PT72: 1.0000 | MEDICATED_PATCH | Freq: Once | TRANSDERMAL | Status: DC | PRN

## 2018-12-12 MED ORDER — CLINDAMYCIN PHOSPHATE 600 MG/50ML IV SOLN
INTRAVENOUS | Status: DC | PRN
  Administered 2018-12-12: 600 mg via INTRAVENOUS

## 2018-12-12 MED ORDER — FENTANYL CITRATE (PF) 100 MCG/2ML IJ SOLN
50.0000 ug | INTRAMUSCULAR | Status: DC | PRN
  Administered 2018-12-12: 100 ug via INTRAVENOUS

## 2018-12-12 MED ORDER — ONDANSETRON HCL 4 MG PO TABS: 4.0000 mg | ORAL_TABLET | Freq: Three times a day (TID) | ORAL | 0 refills | Status: DC | PRN

## 2018-12-12 MED ORDER — MIDAZOLAM HCL 2 MG/2ML IJ SOLN
INTRAMUSCULAR | Status: AC
  Filled 2018-12-12: qty 2

## 2018-12-12 MED ORDER — ONDANSETRON HCL 4 MG/2ML IJ SOLN
INTRAMUSCULAR | Status: AC
  Filled 2018-12-12: qty 2

## 2018-12-12 MED ORDER — MEPERIDINE HCL 25 MG/ML IJ SOLN: 6.2500 mg | INTRAMUSCULAR | Status: DC | PRN

## 2018-12-12 MED ORDER — PROMETHAZINE HCL 25 MG/ML IJ SOLN: 6.2500 mg | INTRAMUSCULAR | Status: DC | PRN

## 2018-12-12 MED ORDER — CHLORHEXIDINE GLUCONATE 4 % EX LIQD: 60.0000 mL | Freq: Once | CUTANEOUS | Status: DC

## 2018-12-12 MED ORDER — FENTANYL CITRATE (PF) 100 MCG/2ML IJ SOLN
INTRAMUSCULAR | Status: AC
  Filled 2018-12-12: qty 2

## 2018-12-12 MED ORDER — ONDANSETRON HCL 4 MG/2ML IJ SOLN
INTRAMUSCULAR | Status: DC | PRN
  Administered 2018-12-12: 4 mg via INTRAVENOUS

## 2018-12-12 MED ORDER — PROPOFOL 500 MG/50ML IV EMUL
INTRAVENOUS | Status: AC
  Filled 2018-12-12: qty 50

## 2018-12-12 MED ORDER — LACTATED RINGERS IV SOLN: INTRAVENOUS | Status: DC

## 2018-12-12 MED ORDER — CLONIDINE HCL (ANALGESIA) 100 MCG/ML EP SOLN
EPIDURAL | Status: DC | PRN
  Administered 2018-12-12: 80 ug

## 2018-12-12 MED ORDER — OXYCODONE-ACETAMINOPHEN 5-325 MG PO TABS: 1.0000 | ORAL_TABLET | Freq: Four times a day (QID) | ORAL | 0 refills | Status: DC | PRN

## 2018-12-12 MED ORDER — PROPOFOL 500 MG/50ML IV EMUL
INTRAVENOUS | Status: DC | PRN
  Administered 2018-12-12: 50 ug/kg/min via INTRAVENOUS

## 2018-12-12 MED ORDER — MIDAZOLAM HCL 2 MG/2ML IJ SOLN
1.0000 mg | INTRAMUSCULAR | Status: DC | PRN
  Administered 2018-12-12: 1 mg via INTRAVENOUS
  Administered 2018-12-12: 2 mg via INTRAVENOUS
  Administered 2018-12-12: 1 mg via INTRAVENOUS

## 2018-12-12 SURGICAL SUPPLY — 78 items
BANDAGE ACE 3X5.8 VEL STRL LF (GAUZE/BANDAGES/DRESSINGS) ×3 IMPLANT
BIT DRILL PASSING CMC 1/4 FLEX (BIT) IMPLANT
BLADE MINI RND TIP GREEN BEAV (BLADE) ×2 IMPLANT
BLADE SURG 15 STRL LF DISP TIS (BLADE) ×2 IMPLANT
BLADE SURG 15 STRL SS (BLADE) ×6
BNDG CMPR 9X4 STRL LF SNTH (GAUZE/BANDAGES/DRESSINGS) ×1
BNDG ESMARK 4X9 LF (GAUZE/BANDAGES/DRESSINGS) ×3 IMPLANT
BRUSH SCRUB EZ PLAIN DRY (MISCELLANEOUS) ×3 IMPLANT
BUTTON ALL-SUT W/BACKSTOP (Orthopedic Implant) ×2 IMPLANT
CANISTER SUCT 1200ML W/VALVE (MISCELLANEOUS) ×4 IMPLANT
CORD BIPOLAR FORCEPS 12FT (ELECTRODE) ×3 IMPLANT
COVER BACK TABLE 60X90IN (DRAPES) ×3 IMPLANT
COVER WAND RF STERILE (DRAPES) IMPLANT
CUFF TOURNIQUET SINGLE 18IN (TOURNIQUET CUFF) ×2 IMPLANT
DECANTER SPIKE VIAL GLASS SM (MISCELLANEOUS) IMPLANT
DRAPE EXTREMITY T 121X128X90 (DISPOSABLE) ×3 IMPLANT
DRAPE IMP U-DRAPE 54X76 (DRAPES) ×3 IMPLANT
DRAPE OEC MINIVIEW 54X84 (DRAPES) ×2 IMPLANT
DRAPE SURG 17X23 STRL (DRAPES) ×3 IMPLANT
DRILL PASSING CMC 1/4 FLEX (BIT) ×3
GAUZE 4X4 16PLY RFD (DISPOSABLE) IMPLANT
GAUZE SPONGE 4X4 12PLY STRL (GAUZE/BANDAGES/DRESSINGS) ×3 IMPLANT
GAUZE XEROFORM 1X8 LF (GAUZE/BANDAGES/DRESSINGS) ×3 IMPLANT
GLOVE BIO SURGEON STRL SZ 6.5 (GLOVE) ×1 IMPLANT
GLOVE BIO SURGEONS STRL SZ 6.5 (GLOVE) ×1
GLOVE BIOGEL PI IND STRL 7.0 (GLOVE) ×1 IMPLANT
GLOVE BIOGEL PI INDICATOR 7.0 (GLOVE) ×6
GLOVE ECLIPSE 7.0 STRL STRAW (GLOVE) ×3 IMPLANT
GLOVE SKINSENSE NS SZ7.5 (GLOVE) ×2
GLOVE SKINSENSE STRL SZ7.5 (GLOVE) ×1 IMPLANT
GLOVE SURG SYN 7.5  E (GLOVE) ×4
GLOVE SURG SYN 7.5 E (GLOVE) ×2 IMPLANT
GLOVE SURG SYN 7.5 PF PI (GLOVE) ×2 IMPLANT
GOWN SRG XL LVL 4 BRTHBL STRL (GOWNS) ×1 IMPLANT
GOWN STRL NON-REIN XL LVL4 (GOWNS)
GOWN STRL REIN XL XLG (GOWN DISPOSABLE) ×3 IMPLANT
GOWN STRL REUS W/ TWL LRG LVL3 (GOWN DISPOSABLE) ×1 IMPLANT
GOWN STRL REUS W/ TWL XL LVL3 (GOWN DISPOSABLE) ×1 IMPLANT
GOWN STRL REUS W/TWL LRG LVL3 (GOWN DISPOSABLE) ×3
GOWN STRL REUS W/TWL XL LVL3 (GOWN DISPOSABLE) ×3
NDL HYPO 25X1 1.5 SAFETY (NEEDLE) IMPLANT
NEEDLE HYPO 25X1 1.5 SAFETY (NEEDLE) IMPLANT
NS IRRIG 1000ML POUR BTL (IV SOLUTION) ×5 IMPLANT
PACK BASIN DAY SURGERY FS (CUSTOM PROCEDURE TRAY) ×3 IMPLANT
PAD CAST 3X4 CTTN HI CHSV (CAST SUPPLIES) ×1 IMPLANT
PADDING CAST ABS 3INX4YD NS (CAST SUPPLIES)
PADDING CAST ABS 4INX4YD NS (CAST SUPPLIES) ×2
PADDING CAST ABS COTTON 3X4 (CAST SUPPLIES) IMPLANT
PADDING CAST ABS COTTON 4X4 ST (CAST SUPPLIES) ×1 IMPLANT
PADDING CAST COTTON 3X4 STRL (CAST SUPPLIES) ×3
RUBBERBAND STERILE (MISCELLANEOUS) ×3 IMPLANT
SLEEVE SCD COMPRESS KNEE MED (MISCELLANEOUS) ×3 IMPLANT
SLING ARM FOAM STRAP LRG (SOFTGOODS) ×2 IMPLANT
SPLINT FIBERGLASS 3X35 (CAST SUPPLIES) ×2 IMPLANT
STOCKINETTE 4X48 STRL (DRAPES) ×3 IMPLANT
SUCTION FRAZIER HANDLE 10FR (MISCELLANEOUS)
SUCTION TUBE FRAZIER 10FR DISP (MISCELLANEOUS) IMPLANT
SUT ETHIBOND 0 MO6 C/R (SUTURE) IMPLANT
SUT ETHILON 4 0 PS 2 18 (SUTURE) ×5 IMPLANT
SUT FIBERWIRE #2 38 T-5 BLUE (SUTURE)
SUT FIBERWIRE 2-0 18 17.9 3/8 (SUTURE)
SUT MNCRL AB 4-0 PS2 18 (SUTURE) IMPLANT
SUT VIC AB 0 SH 27 (SUTURE) IMPLANT
SUT VIC AB 2-0 CT1 27 (SUTURE)
SUT VIC AB 2-0 CT1 TAPERPNT 27 (SUTURE) IMPLANT
SUT VIC AB 2-0 SH 27 (SUTURE) ×6
SUT VIC AB 2-0 SH 27XBRD (SUTURE) ×1 IMPLANT
SUTURE FIBERWR #2 38 T-5 BLUE (SUTURE) IMPLANT
SUTURE FIBERWR 2-0 18 17.9 3/8 (SUTURE) IMPLANT
SUTURE TAPE 1.3 FIBERLOP 20 ST (SUTURE) IMPLANT
SUTURETAPE 1.3 FIBERLOOP 20 ST (SUTURE)
SYR BULB 3OZ (MISCELLANEOUS) ×3 IMPLANT
SYR CONTROL 10ML LL (SYRINGE) IMPLANT
TOWEL GREEN STERILE FF (TOWEL DISPOSABLE) ×6 IMPLANT
TRAY DSU PREP LF (CUSTOM PROCEDURE TRAY) ×3 IMPLANT
TUBE CONNECTING 20'X1/4 (TUBING) ×1
TUBE CONNECTING 20X1/4 (TUBING) ×1 IMPLANT
UNDERPAD 30X30 (UNDERPADS AND DIAPERS) ×3 IMPLANT

## 2018-12-12 NOTE — Anesthesia Postprocedure Evaluation (Signed)
Anesthesia Post Note  [name]  Procedure(s) Performed: RIGHT THUMB CARPOMETACARPAL ([place]) ARTHROPLASTY (Right Hand)     Patient location during evaluation: PACU Anesthesia Type: Regional Level of consciousness: awake and alert Pain management: pain level controlled Vital Signs Assessment: post-procedure vital signs reviewed and stable Respiratory status: spontaneous breathing Cardiovascular status: stable Anesthetic complications: no    Last Vitals:  Vitals:   12/12/18 1141 12/12/18 1150  BP: (!) 141/80 137/82  Pulse: (!) 52 (!) 56  Resp: 17 14  Temp:  36.4 C  SpO2: 97% 94%    Last Pain:  Vitals:   12/12/18 1150  TempSrc:   PainSc: 0-No pain                 Nolon Nations

## 2018-12-12 NOTE — H&P (Signed)
PREOPERATIVE H&P  Chief Complaint: right thumb carpometacarpal osteoarthritis  HPI: Natalie Bond is a 63 y.o. female who presents for surgical treatment of right thumb carpometacarpal osteoarthritis.  She denies any changes in medical history.  Past Medical History:  Diagnosis Date  . Arthritis    back, wrists  . Cancer ([place])    skin cancer  . Complication of anesthesia   . Diabetes mellitus without complication ([place])    pt recently started on metformin  . GERD (gastroesophageal reflux disease)   . Headache   . High cholesterol   . History of bronchitis   . History of kidney stones    2015  . Hypertension   . PONV (postoperative nausea and vomiting)   . Sleep apnea    pt. does not use a CPAP or BiPAP  . Stroke ([place])    Pt. states possible mini stroke in 2014, no deficits   Past Surgical History:  Procedure Laterality Date  . ABDOMINAL HYSTERECTOMY    . BACK SURGERY    . CARDIAC CATHETERIZATION     01/27/15 [facility]): EF 60%, normal coronaries.  Marland Kitchen CATARACT EXTRACTION, BILATERAL    . CHOLECYSTECTOMY  07/2016   done [place]  . EYE SURGERY    . HERNIA REPAIR Left 1990   left groin   . LOWER EXTREMITY ANGIOGRAM Left 06/08/2015   Procedure: Ascending Venogram  Iliac Vein ;  Surgeon: Elam Dutch, MD;  Location: [facility] OR;  Service: Vascular;  Laterality: Left;  ;  TARA- [facility] [employer] / ZACH-[place] HAVE BEEN NOTIFIED/ CONFIRMED  . PARTIAL HYSTERECTOMY    . SHOULDER SURGERY    . TOTAL HIP ARTHROPLASTY Left 10/21/2016   Procedure: LEFT TOTAL HIP ARTHROPLASTY ANTERIOR APPROACH;  Surgeon: Mcarthur Rossetti, MD;  Location: [facility] ORS;  Service: Orthopedics;  Laterality: Left;  . WRIST SURGERY Bilateral    carpal tunnel   Social History   Socioeconomic History  . Marital status: Married    Spouse name: marty  . Number of children: 2  . Years of education: college  . Highest education level: Not on file  Occupational History  . Occupation: [employer]  Social Needs  . Financial resource strain: Not on file  . Food insecurity:    Worry: Not on file    Inability: Not on file  . Transportation needs:    Medical: Not on file    Non-medical: Not on file  Tobacco Use  . Smoking status: Never Smoker  . Smokeless tobacco: Never Used  Substance and Sexual Activity  . Alcohol use: No  . Drug use: No  . Sexual activity: Not on file  Lifestyle  . Physical activity:    Days per week: Not on file    Minutes per session: Not on file  . Stress: Not on file  Relationships  . Social connections:    Talks on phone: Not on file    Gets together: Not on file    Attends religious service: Not on file    Active member of club or organization: Not on file    Attends meetings of clubs or organizations: Not on file    Relationship status: Not on file  Other Topics Concern  . Not on file  Social History Narrative  . Not on file   Family History  Problem Relation Age of Onset  . Cancer Mother   . Diabetes Mother   . Heart disease Mother   .  Hyperlipidemia Mother   . Hypertension Mother   . Cancer Father   . Diabetes Father   . Heart disease Father   . Hyperlipidemia Father   . Hypertension Father   . Varicose Veins Father   . Cancer Sister   . Heart disease Sister   . Hyperlipidemia Sister   . Cancer Brother   . Diabetes Brother   . Hyperlipidemia Brother    Allergies  Allergen Reactions  . Dilaudid [Hydromorphone Hcl] Anaphylaxis    [name]   . Penicillins Anaphylaxis and Rash    Has patient had a PCN reaction causing immediate rash, facial/tongue/throat swelling, SOB or lightheadedness with hypotension: Yes Has patient had a PCN reaction causing severe rash involving mucus membranes or skin necrosis: No Has patient had a PCN reaction that required hospitalization Yes Has patient had a PCN reaction occurring within the last 10 years: No If all of the above answers are "NO", then  may proceed with Cephalosporin use.    Prior to Admission medications   Medication Sig Start Date End Date Taking? Authorizing Provider  baclofen (LIORESAL) 10 MG tablet Take 1 tablet (10 mg total) by mouth every 8 (eight) hours as needed for muscle spasms (Pain). 01/24/18  Yes Magnus Sinning, MD  cloNIDine (CATAPRES) 0.1 MG tablet Take 0.1 mg by mouth 2 (two) times daily as needed (Blood pressure is over 160). If BP is over 160   Yes [provider]  diclofenac sodium (VOLTAREN) 1 % GEL Apply 2 g 4 (four) times daily topically. 10/05/17  Yes Mcarthur Rossetti, MD  gabapentin (NEURONTIN) 100 MG capsule Take 1 capsule (100 mg total) by mouth at bedtime. 05/04/17  Yes Jessy Oto, MD  HYDROcodone-acetaminophen (NORCO/VICODIN) 5-325 MG tablet Take 1-2 tablets by mouth every 6 (six) hours as needed for moderate pain. 11/03/16  Yes Mcarthur Rossetti, MD  losartan-hydrochlorothiazide (HYZAAR) 100-12.5 MG tablet Take 1 tablet by mouth daily.  08/11/16  Yes [provider]  magnesium 30 MG tablet Take 30 mg by mouth 2 (two) times daily.   Yes [provider]  meloxicam (MOBIC) 7.5 MG tablet Take 1 tablet (7.5 mg total) by mouth daily. 04/11/17  Yes Newt Minion, MD  metFORMIN (GLUCOPHAGE) 500 MG tablet Take by mouth 2 (two) times daily with a meal.   Yes [provider]  metoprolol tartrate (LOPRESSOR) 50 MG tablet Take by mouth.   Yes [provider]  omeprazole (PRILOSEC) 20 MG capsule Take 20 mg by mouth daily.   Yes [provider]  Potassium 75 MG TABS Take by mouth daily.   Yes [provider]  topiramate (TOPAMAX) 25 MG tablet Take 25 mg by mouth at bedtime. 08/08/16  Yes [provider]  aspirin 81 MG chewable tablet Chew 1 tablet (81 mg total) by mouth 2 (two) times daily. 10/23/16   Mcarthur Rossetti, MD     Positive ROS: All other systems have been reviewed and were otherwise negative with the exception  of those mentioned in the HPI and as above.  Physical Exam: General: Alert, no acute distress Cardiovascular: No pedal edema Respiratory: No cyanosis, no use of accessory musculature GI: abdomen soft Skin: No lesions in the area of chief complaint Neurologic: Sensation intact distally Psychiatric: Patient is competent for consent with normal mood and affect Lymphatic: no lymphedema  MUSCULOSKELETAL: exam stable  Assessment: right thumb carpometacarpal osteoarthritis  Plan: Plan for Procedure(s): RIGHT THUMB CARPOMETACARPAL (CMC) ARTHROPLASTY  The risks benefits and alternatives were discussed with the patient including but not limited to the risks of nonoperative treatment, versus surgical intervention including infection, bleeding, nerve injury,  blood clots, cardiopulmonary complications, morbidity, mortality, among others, and they were willing to proceed.   Eduard Roux, MD   12/12/2018 8:07 AM

## 2018-12-12 NOTE — Anesthesia Procedure Notes (Signed)
Anesthesia Regional Block: Axillary brachial plexus block   Pre-Anesthetic Checklist: ,, timeout performed, Correct Patient, Correct Site, Correct Laterality, Correct Procedure, Correct Position, site marked, Risks and benefits discussed,  Surgical consent,  Pre-op evaluation,  At surgeon's request and post-op pain management  Laterality: Right  Prep: chloraprep       Needles:  Injection technique: Single-shot  Needle Type: Stimiplex          Additional Needles:   Narrative:  Start time: 12/12/2018 8:42 AM End time: 12/12/2018 8:47 AM  Performed by: Personally  Anesthesiologist: Nolon Nations, MD  Additional Notes: Patient tolerated the procedure well without complications

## 2018-12-12 NOTE — Anesthesia Preprocedure Evaluation (Signed)
Anesthesia Evaluation  Patient identified by MRN, date of birth, ID band Patient awake    Reviewed: Allergy & Precautions, NPO status , Patient's Chart, lab work & pertinent test results  History of Anesthesia Complications (+) PONV and history of anesthetic complications  Airway Mallampati: I       Dental  (+) Edentulous Lower, Edentulous Upper   Pulmonary shortness of breath and with exertion, sleep apnea and Continuous Positive Airway Pressure Ventilation ,    Pulmonary exam normal breath sounds clear to auscultation       Cardiovascular hypertension, Pt. 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   Abdominal Normal abdominal exam  (+)   Peds  Hematology negative hematology ROS (+)   Anesthesia Other Findings   Reproductive/Obstetrics negative OB ROS                             Anesthesia Physical  Anesthesia Plan  ASA: III  Anesthesia Plan: Regional   Post-op Pain Management:    Induction: Intravenous  PONV Risk Score and Plan: 3 and Ondansetron, Propofol infusion, Dexamethasone and Treatment may vary due to age or medical condition  Airway Management Planned: Simple Face Mask and Natural Airway  Additional Equipment: None  Intra-op Plan:   Post-operative Plan:   Informed Consent: I have reviewed the patients History and Physical, chart, labs and discussed the procedure including the risks, benefits and alternatives for the proposed anesthesia with the patient or authorized representative who has indicated his/her understanding and acceptance.     Dental advisory  given  Plan Discussed with: CRNA  Anesthesia Plan Comments:        Anesthesia Quick Evaluation

## 2018-12-12 NOTE — Op Note (Signed)
   DATE OF SURGERY:12/12/2018  PREOPERATIVE DIAGNOSIS: Right thumb basal joint arthritis.  POSTOPERATIVE DIAGNOSIS: same.  PROCEDURE:  1. Right thumb carpometacarpal interposition arthroplasty ([id number]); thumb   IMPLANTS: Conmed Microlink Tightrope  SURGEON: N. Eduard Roux, MD  ASSIST: Madalyn Rob, PA-C; necessary for the timely completion of procedure and due to complexity of procedure.  ANESTHESIA: regional block  TOURNIQUET TIME: less than 60 mins.  BLOOD LOSS: Minimal.  COMPLICATIONS: None.  PATHOLOGY: None.  TIME OUT: A time out was performed before the procedure started.  INDICATIONS FOR PROCEDURE: The patient presents today for the above mentioned procedure after failing extensive conservative measures.  The risks, benefits, and alternatives to surgery were discussed and the patient elected to proceed with surgery.  DESCRIPTION OF PROCEDURE: A Wagner's incision was made. The superficial radial nerve was identified and protected. Radial artery was exposed and protected. We dissected sharply in between the extensor pollicis brevis and abductor pollicis longus tendon interval. Capsulotomy was performed. Capsular flaps were raised. Fluoroscopy was used to identify the borders of the trapezium.  Trapezium was exposed and removed in a piecemeal fashion.  I then made a second incision at the ulnar base of the second metacarpal.  Dissection was carried down to the bone.  The muscle was elevated off of the the ulnar aspect of the second metacarpal base.  A targeting guide was then placed at the base of the second metacarpal and the base of the thumb metacarpal.  This was then confirmed under fluoroscopy for appropriate trajectory.  The pin was then drilled from the second metacarpal to the thumb metacarpal.  A 2-0 Vicryl suture was then delivered across the path and the suture button was then pulled back through using the 2-0 Vicryl.  The radiopaque button was then tightened  down onto the radial aspect of the thumb metacarpal.  The second radiopaque button was then delivered through the free end of the second metacarpal side.  The tension was then adjusted appropriately and confirmed under fluoroscopy.  The thumb had appropriate range of motion.  The radiopaque button was then tightened down with 6 stitches in the suture.  The wound was irrigated. The capsular tissue was repaired using 2-0 Vicryl sutures. Then tourniquet was deflated. Hemostasis achieved. Wounds were irrigated and closed using 4-0 nylon sutures. Sterile dressing applied, hand immobilized in a thumb spica splint. The patient then was transferred to the recovery room in stable condition after all counts were correct.  POSTOPERATIVE PLAN: Return in two weeks for suture removal and application of a thumb spica cast. Four weeks after surgery cast will be removed and switch to a thumb spica brace and start hand therapy.

## 2018-12-12 NOTE — Transfer of Care (Signed)
Immediate Anesthesia Transfer of Care Note  [name]  Procedure(s) Performed: RIGHT THUMB CARPOMETACARPAL (CMC) ARTHROPLASTY (Right Hand)  Patient Location: PACU  Anesthesia Type:MAC and Regional  Level of Consciousness: awake, alert  and oriented  Airway & Oxygen Therapy: Patient Spontanous Breathing and Patient connected to face mask oxygen  Post-op Assessment: Report given to RN and Post -op Vital signs reviewed and stable  Post vital signs: Reviewed and stable  Last Vitals:  Vitals Value Taken Time  BP    Temp    Pulse    Resp    SpO2      Last Pain:  Vitals:   12/12/18 0802  TempSrc: Oral  PainSc: 0-No pain         Complications: No apparent anesthesia complications

## 2018-12-12 NOTE — Progress Notes (Signed)
Assisted Dr. Germeroth with right, ultrasound guided, axillary block. Side rails up, monitors on throughout procedure. See vital signs in flow sheet. Tolerated Procedure well. 

## 2018-12-12 NOTE — Discharge Instructions (Signed)
Postoperative instructions:  Weightbearing instructions: non weight bearing  Dressing instructions: Keep your dressing and/or splint clean and dry at all times.  It will be removed at your first post-operative appointment.  Your stitches and/or staples will be removed at this visit.  Incision instructions:  Do not soak your incision for 3 weeks after surgery.  If the incision gets wet, pat dry and do not scrub the incision.  Pain control:  You have been given a prescription to be taken as directed for post-operative pain control.  In addition, elevate the operative extremity above the heart at all times to prevent swelling and throbbing pain.  Take over-the-counter Colace, 100mg  by mouth twice a day while taking narcotic pain medications to help prevent constipation.  Follow up appointments: 1) 12-14 days for suture removal and wound check. 2) Dr. Erlinda Hong as scheduled.   -------------------------------------------------------------------------------------------------------------  After Surgery Pain Control:  After your surgery, post-surgical discomfort or pain is likely. This discomfort can last several days to a few weeks. At certain times of the day your discomfort may be more intense.  Did you receive a nerve block?  A nerve block can provide pain relief for one hour to two days after your surgery. As long as the nerve block is working, you will experience little or no sensation in the area the surgeon operated on.  As the nerve block wears off, you will begin to experience pain or discomfort. It is very important that you begin taking your prescribed pain medication before the nerve block fully wears off. Treating your pain at the first sign of the block wearing off will ensure your pain is better controlled and more tolerable when full-sensation returns. Do not wait until the pain is intolerable, as the medicine will be less effective. It is better to treat pain in advance than to try and  catch up.  General Anesthesia:  If you did not receive a nerve block during your surgery, you will need to start taking your pain medication shortly after your surgery and should continue to do so as prescribed by your surgeon.  Pain Medication:  Most commonly we prescribe Vicodin and Percocet for post-operative pain. Both of these medications contain a combination of acetaminophen (Tylenol) and a narcotic to help control pain.   It takes between 30 and 45 minutes before pain medication starts to work. It is important to take your medication before your pain level gets too intense.   Nausea is a common side effect of many pain medications. You will want to eat something before taking your pain medicine to help prevent nausea.   If you are taking a prescription pain medication that contains acetaminophen, we recommend that you do not take additional over the counter acetaminophen (Tylenol).  Other pain relieving options:   Using a cold pack to ice the affected area a few times a day (15 to 20 minutes at a time) can help to relieve pain, reduce swelling and bruising.   Elevation of the affected area can also help to reduce pain and swelling.     Post Anesthesia Home Care Instructions  Activity: Get plenty of rest for the remainder of the day. A responsible individual must stay with you for 24 hours following the procedure.  For the next 24 hours, DO NOT: -Drive a car -Paediatric nurse -Drink alcoholic beverages -Take any medication unless instructed by your physician -Make any legal decisions or sign important papers.  Meals: Start with liquid foods such as gelatin  or soup. Progress to regular foods as tolerated. Avoid greasy, spicy, heavy foods. If nausea and/or vomiting occur, drink only clear liquids until the nausea and/or vomiting subsides. Call your physician if vomiting continues.  Special Instructions/Symptoms: Your throat may feel dry or sore from the anesthesia or the  breathing tube placed in your throat during surgery. If this causes discomfort, gargle with warm salt water. The discomfort should disappear within 24 hours.  If you had a scopolamine patch placed behind your ear for the management of post- operative nausea and/or vomiting:  1. The medication in the patch is effective for 72 hours, after which it should be removed.  Wrap patch in a tissue and discard in the trash. Wash hands thoroughly with soap and water. 2. You may remove the patch earlier than 72 hours if you experience unpleasant side effects which may include dry mouth, dizziness or visual disturbances. 3. Avoid touching the patch. Wash your hands with soap and water after contact with the patch.    Regional Anesthesia Blocks  1. Numbness or the inability to move the "blocked" extremity may last from 3-48 hours after placement. The length of time depends on the medication injected and your individual response to the medication. If the numbness is not going away after 48 hours, call your surgeon.  2. The extremity that is blocked will need to be protected until the numbness is gone and the  Strength has returned. Because you cannot feel it, you will need to take extra care to avoid injury. Because it may be weak, you may have difficulty moving it or using it. You may not know what position it is in without looking at it while the block is in effect.  3. For blocks in the legs and feet, returning to weight bearing and walking needs to be done carefully. You will need to wait until the numbness is entirely gone and the strength has returned. You should be able to move your leg and foot normally before you try and bear weight or walk. You will need someone to be with you when you first try to ensure you do not fall and possibly risk injury.  4. Bruising and tenderness at the needle site are common side effects and will resolve in a few days.  5. Persistent numbness or new problems with movement  should be communicated to the surgeon or the [place] [phone] [place] [phone]).

## 2018-12-13 ENCOUNTER — Encounter (HOSPITAL_BASED_OUTPATIENT_CLINIC_OR_DEPARTMENT_OTHER): Payer: Self-pay | Admitting: Orthopaedic Surgery

## 2018-12-14 ENCOUNTER — Telehealth (INDEPENDENT_AMBULATORY_CARE_PROVIDER_SITE_OTHER): Payer: Self-pay | Admitting: Orthopaedic Surgery

## 2018-12-14 ENCOUNTER — Other Ambulatory Visit (INDEPENDENT_AMBULATORY_CARE_PROVIDER_SITE_OTHER): Payer: Self-pay

## 2018-12-14 ENCOUNTER — Telehealth (INDEPENDENT_AMBULATORY_CARE_PROVIDER_SITE_OTHER): Payer: Self-pay | Admitting: Family Medicine

## 2018-12-14 MED ORDER — IBUPROFEN 800 MG PO TABS: 800.0000 mg | ORAL_TABLET | Freq: Three times a day (TID) | ORAL | 0 refills | Status: DC | PRN

## 2018-12-14 MED ORDER — HYDROMORPHONE HCL 2 MG PO TABS: 2.0000 mg | ORAL_TABLET | Freq: Three times a day (TID) | ORAL | 0 refills | Status: DC | PRN

## 2018-12-14 NOTE — Telephone Encounter (Signed)
Please advise 

## 2018-12-14 NOTE — Telephone Encounter (Signed)
It was approved by Dr. Erlinda Hong, so I called in for him.  She's post-op.

## 2018-12-14 NOTE — Telephone Encounter (Signed)
I called and spoke with the pharmacist: the patient picked up #30 Hydrocodone yesterday from them (written by Dr. Sharlett Iles - no diagnosis on the Rx). The pharmacist said she cannot fill the Hydromorphone because she has the Hydrocodone. Please advise Dr. Erlinda Hong.

## 2018-12-14 NOTE — Telephone Encounter (Signed)
Ibuprofen called into pharm.

## 2018-12-14 NOTE — Telephone Encounter (Signed)
[facility] called stating that they received an RX for Hydromorphone today by Dr. Junius Roads.  She stated that this patient has also received Oxycodone on the 16th from Dr. Sharlett Iles.  She is wanting to know if Dr. Junius Roads wants to them to still fill this medication.  CB#[phone].  Thank you.

## 2018-12-14 NOTE — Telephone Encounter (Signed)
Rx sent 

## 2018-12-14 NOTE — Telephone Encounter (Signed)
Thank you :)

## 2018-12-14 NOTE — Telephone Encounter (Signed)
See message below °

## 2018-12-14 NOTE — Telephone Encounter (Signed)
Yes ibuprofen 800 mg is fine.  We can send in dilaudid 2 mg tid prn #30.

## 2018-12-14 NOTE — Telephone Encounter (Signed)
Patient called to check on pain medicine

## 2018-12-14 NOTE — Telephone Encounter (Signed)
Can you call Dilaudid into pharm please. Xu approved it. Thanks you.

## 2018-12-14 NOTE — Telephone Encounter (Signed)
Patient called advised she is in a great deal of pain. The nerve block only lasted part of the day after surgery. Patient said the percocet is not working at all and is making her sick and she is itching really bad. Patient said she stopped taking the medicine. Patient asked if Dr. Erlinda Hong can call in Ibuprofen 800 mg for her? The number to contact patient is [phone]

## 2018-12-17 ENCOUNTER — Ambulatory Visit (INDEPENDENT_AMBULATORY_CARE_PROVIDER_SITE_OTHER): Payer: 59 | Admitting: Physical Medicine and Rehabilitation

## 2018-12-17 ENCOUNTER — Encounter (INDEPENDENT_AMBULATORY_CARE_PROVIDER_SITE_OTHER): Payer: Self-pay | Admitting: Physical Medicine and Rehabilitation

## 2018-12-17 ENCOUNTER — Ambulatory Visit (INDEPENDENT_AMBULATORY_CARE_PROVIDER_SITE_OTHER): Payer: Self-pay

## 2018-12-17 VITALS — BP 141/91 | HR 77 | Temp 98.1°F | Ht 67.5 in | Wt 183.0 lb

## 2018-12-17 DIAGNOSIS — M47816 Spondylosis without myelopathy or radiculopathy, lumbar region: Secondary | ICD-10-CM

## 2018-12-17 MED ORDER — METHYLPREDNISOLONE ACETATE 80 MG/ML IJ SUSP
80.0000 mg | Freq: Once | INTRAMUSCULAR | Status: AC
  Administered 2018-12-17: 80 mg

## 2018-12-17 NOTE — Patient Instructions (Signed)

## 2018-12-17 NOTE — Telephone Encounter (Signed)
See message below °

## 2018-12-17 NOTE — Progress Notes (Signed)
 .  Numeric Pain Rating Scale and Functional Assessment Average Pain 8   In the last MONTH (on 0-10 scale) has pain interfered with the following?  1. General activity like being  able to carry out your everyday physical activities such as walking, climbing stairs, carrying groceries, or moving a chair?  Rating(8)   +Driver, -BT, -Dye Allergies.  

## 2018-12-17 NOTE — Telephone Encounter (Signed)
ok 

## 2018-12-18 NOTE — Progress Notes (Signed)
DAWNNA GRITZ - 63 y.o. female MRN [id number]  Date of birth: [birth year]  Office Visit Note: Visit Date: 12/17/2018 PCP: Leanna Battles, MD Referred by: Leanna Battles, MD  Subjective: Chief Complaint  Patient presents with  . Spine - Pain   HPI:  CHANEL MCADAMS is a 63 y.o. female who comes in today For planned right L4-5 intra-articular facet joint block.  Please see our prior evaluation and management note for further details and justification.  She had recent surgery on her right hand no other new changes.  ROS Otherwise per HPI.  Assessment & Plan: Visit Diagnoses:  1. Spondylosis without myelopathy or radiculopathy, lumbar region     Plan: No additional findings.   Meds & Orders:  Meds ordered this encounter  Medications  . methylPREDNISolone acetate (DEPO-MEDROL) injection 80 mg    Orders Placed This Encounter  Procedures  . Facet Injection  . XR C-ARM NO REPORT    Follow-up: Return if symptoms worsen or fail to improve.   Procedures: No procedures performed  Lumbar Facet Joint Intra-Articular Injection(s) with Fluoroscopic Guidance  Patient: LADONYA JERKINS      Date of Birth: 1956-08-07 MRN: [id number] PCP: Leanna Battles, MD      Visit Date: 12/17/2018   Universal Protocol:    Date/Time: 12/17/2018  Consent Given By: the patient  Position: PRONE   Additional Comments: Vital signs were monitored before and after the procedure. Patient was prepped and draped in the usual sterile fashion. The correct patient, procedure, and site was verified.   Injection Procedure Details:  Procedure Site One Meds Administered:  Meds ordered this encounter  Medications  . methylPREDNISolone acetate (DEPO-MEDROL) injection 80 mg     Laterality: Right  Location/Site:  L4-L5  Needle size: 22 guage  Needle type: Spinal  Needle Placement: Articular  Findings:  -Comments: Excellent flow of contrast producing a partial arthrogram.  Procedure  Details: The fluoroscope beam is vertically oriented in AP, and the inferior recess is visualized beneath the lower pole of the inferior apophyseal process, which represents the target point for needle insertion. When direct visualization is difficult the target point is located at the medial projection of the vertebral pedicle. The region overlying each aforementioned target is locally anesthetized with a 1 to 2 ml. volume of 1% Lidocaine without Epinephrine.   The spinal needle was inserted into each of the above mentioned facet joints using biplanar fluoroscopic guidance. A 0.25 to 0.5 ml. volume of Isovue-250 was injected and a partial facet joint arthrogram was obtained. A single spot film was obtained of the resulting arthrogram.    One to 1.25 ml of the steroid/anesthetic solution was then injected into each of the facet joints noted above.   Additional Comments:  The patient tolerated the procedure well Dressing: Band-Aid    Post-procedure details: Patient was observed during the procedure. Post-procedure instructions were reviewed.  Patient left the clinic in stable condition.    Clinical History: L3-4: Diffuse annular bulge and moderate facet disease with mild bilateral lateral recess encroachment but no spinal or foraminal stenosis. There is a small synovial cyst on the right side which is new but no significant mass effect.  L4-5: Mild stable annular bulge and moderate facet disease contributing to mild lateral recess encroachment bilaterally. Possible irritation of the L5 nerve roots, left greater than right. No focal disc protrusion, significant spinal or foraminal stenosis. This level appears relatively stable.  L5-S1: Stable disc disease and facet disease and  remote postsurgical changes. Mild desiccation and retraction of the central disc protrusion. No direct mass effect on the S1 nerve roots. The exiting L5 nerve roots appear normal.  IMPRESSION: Overall  stable MR examination of the lumbar spine. No significant change since 05/16/2014.  Stable shallow broad-based foraminal and extra foraminal disc protrusion on the left at L2-3 with possible mild impingement on the left L3 nerve root in the lateral recess. No definite mass effect on the exiting left L2 nerve root.  Stable mild bilateral lateral recess encroachment at L3-4. There is a new small synovial cyst on the right side but no mass effect.  Stable multifactorial mild lateral recess encroachment bilaterally at L4-5. Possible irritation of the left L5 nerve roots, left greater than right.  Stable disc disease and facet disease at L5-S1. Mild desiccation and retraction of the central disc protrusion.   Electronically Signed   By: Marijo Sanes M.D.   On: 12/19/2015 10:37     Objective:  VS:  HT:5' 7.5" (171.5 cm)   WT:183 lb (83 kg)  BMI:28.22    BP:(!) 141/91  HR:77bpm  TEMP:98.1 F (36.7 C)(Oral)  RESP:  Physical Exam  Ortho Exam Imaging: Xr C-arm No Report  Result Date: 12/17/2018 Please see Notes tab for imaging impression.

## 2018-12-18 NOTE — Procedures (Signed)
Lumbar Facet Joint Intra-Articular Injection(s) with Fluoroscopic Guidance  [name]      Date of Birth: 1955/12/10 MRN: [id number] PCP: Leanna Battles, MD      Visit Date: 12/17/2018   Universal Protocol:    Date/Time: 12/17/2018  Consent Given By: the patient  Position: PRONE   Additional Comments: Vital signs were monitored before and after the procedure. Patient was prepped and draped in the usual sterile fashion. The correct patient, procedure, and site was verified.   Injection Procedure Details:  Procedure Site One Meds Administered:  Meds ordered this encounter  Medications  . methylPREDNISolone acetate (DEPO-MEDROL) injection 80 mg     Laterality: Right  Location/Site:  L4-L5  Needle size: 22 guage  Needle type: Spinal  Needle Placement: Articular  Findings:  -Comments: Excellent flow of contrast producing a partial arthrogram.  Procedure Details: The fluoroscope beam is vertically oriented in AP, and the inferior recess is visualized beneath the lower pole of the inferior apophyseal process, which represents the target point for needle insertion. When direct visualization is difficult the target point is located at the medial projection of the vertebral pedicle. The region overlying each aforementioned target is locally anesthetized with a 1 to 2 ml. volume of 1% Lidocaine without Epinephrine.   The spinal needle was inserted into each of the above mentioned facet joints using biplanar fluoroscopic guidance. A 0.25 to 0.5 ml. volume of Isovue-250 was injected and a partial facet joint arthrogram was obtained. A single spot film was obtained of the resulting arthrogram.    One to 1.25 ml of the steroid/anesthetic solution was then injected into each of the facet joints noted above.   Additional Comments:  The patient tolerated the procedure well Dressing: Band-Aid    Post-procedure details: Patient was observed during the  procedure. Post-procedure instructions were reviewed.  Patient left the clinic in stable condition.

## 2018-12-26 ENCOUNTER — Encounter (INDEPENDENT_AMBULATORY_CARE_PROVIDER_SITE_OTHER): Payer: Self-pay | Admitting: Orthopaedic Surgery

## 2018-12-26 ENCOUNTER — Ambulatory Visit (INDEPENDENT_AMBULATORY_CARE_PROVIDER_SITE_OTHER): Payer: 59

## 2018-12-26 ENCOUNTER — Ambulatory Visit (INDEPENDENT_AMBULATORY_CARE_PROVIDER_SITE_OTHER): Payer: 59 | Admitting: Orthopaedic Surgery

## 2018-12-26 DIAGNOSIS — M1811 Unilateral primary osteoarthritis of first carpometacarpal joint, right hand: Secondary | ICD-10-CM | POA: Diagnosis not present

## 2018-12-26 MED ORDER — TRAMADOL HCL 50 MG PO TABS: 50.0000 mg | ORAL_TABLET | Freq: Three times a day (TID) | ORAL | 2 refills | Status: DC | PRN

## 2018-12-26 NOTE — Progress Notes (Signed)
Post-Op Visit Note   [name]           Date of Birth: 1956-11-04           MRN: [id number] Visit Date: 12/26/2018 PCP: Natalie Battles, MD   Assessment & Plan:  Chief Complaint:  Chief Complaint  Patient presents with  . Right Thumb - Routine Post Op, Pain, Follow-up   Visit Diagnoses:  1. Primary osteoarthritis of first carpometacarpal joint of right hand     Plan: Natalie Bond is two-week status post right thumb CMC arthroplasty.  She is overall doing well.  She still has some pain around the surgical incisions.  She has no signs of infection.  Neurovascular intact.  X-rays demonstrate a stable tight rope construct of the thumb CMC arthroplasty.  Today we remove the sutures and placed her in a Velcro thumb spica brace.  She will wear this at all times and recheck in 2 weeks to begin hand therapy.  Tramadol was prescribed today.  Follow-Up Instructions: Return in about 2 weeks (around 01/09/2019).   Orders:  Orders Placed This Encounter  Procedures  . XR Hand Complete Right   Meds ordered this encounter  Medications  . traMADol (ULTRAM) 50 MG tablet    Sig: Take 1 tablet (50 mg total) by mouth 3 (three) times daily as needed.    Dispense:  30 tablet    Refill:  2    Imaging: Xr Hand Complete Right  Result Date: 12/26/2018 Stable CMC arthroplasty and tight rope construct.   PMFS History: Patient Active Problem List   Diagnosis Date Noted  . Mixed conductive and sensorineural hearing loss of left ear with restricted hearing of right ear 04/26/2018  . Vertigo 04/26/2018  . Venous stasis dermatitis of left lower extremity 04/09/2018  . Achilles tendon contracture, left 04/09/2018  . Pain in left ankle and joints of left foot 04/09/2018  . Primary osteoarthritis of first carpometacarpal joint of right hand 10/09/2017  . Bilateral hand pain 10/05/2017  . Primary osteoarthritis of both first carpometacarpal joints 10/05/2017  . Trochanteric bursitis, right  hip 06/29/2017  . Degenerative disc disease at L5-S1 level 05/04/2017  . History of joint replacement 05/04/2017  . Claw toe, acquired, right 04/27/2017  . Achilles tendon contracture, right 04/27/2017  . Pain in right foot 04/11/2017  . Status post left hip replacement 10/21/2016  . Unilateral primary osteoarthritis, left hip 09/23/2016    Class: Chronic  . Chronic back pain 01/14/2016  . Migraine 01/14/2016  . Essential hypertension 01/22/2015   Past Medical History:  Diagnosis Date  . Arthritis    back, wrists  . Cancer ([place])    skin cancer  . Complication of anesthesia   . Diabetes mellitus without complication ([place])    pt recently started on metformin  . GERD (gastroesophageal reflux disease)   . Headache   . High cholesterol   . History of bronchitis   . History of kidney stones    2015  . Hypertension   . PONV (postoperative nausea and vomiting)   . Sleep apnea    pt. does not use a CPAP or BiPAP  . Stroke ([place])    Pt. states possible mini stroke in 2014, no deficits    Family History  Problem Relation Age of Onset  . Cancer Mother   . Diabetes Mother   . Heart disease Mother   . Hyperlipidemia Mother   . Hypertension Mother   . Cancer Father   .  Diabetes Father   . Heart disease Father   . Hyperlipidemia Father   . Hypertension Father   . Varicose Veins Father   . Cancer Sister   . Heart disease Sister   . Hyperlipidemia Sister   . Cancer Brother   . Diabetes Brother   . Hyperlipidemia Brother     Past Surgical History:  Procedure Laterality Date  . ABDOMINAL HYSTERECTOMY    . BACK SURGERY    . CARDIAC CATHETERIZATION     01/27/15 [facility]): EF 60%, normal coronaries.  Mortimer Fries SUSPENSION PLASTY Right 12/12/2018   Procedure: RIGHT THUMB CARPOMETACARPAL ([place]) ARTHROPLASTY;  Surgeon: Leandrew Koyanagi, MD;  Location: [place];  Service: Orthopedics;  Laterality: Right;  . CATARACT EXTRACTION, BILATERAL    . CHOLECYSTECTOMY  07/2016    done [place]  . EYE SURGERY    . HERNIA REPAIR Left 1990   left groin   . LOWER EXTREMITY ANGIOGRAM Left 06/08/2015   Procedure: Ascending Venogram  Iliac Vein ;  Surgeon: Elam Dutch, MD;  Location: [facility] OR;  Service: Vascular;  Laterality: Left;  ;  [facility] / [facility] HAVE BEEN NOTIFIED/ CONFIRMED  . PARTIAL HYSTERECTOMY    . SHOULDER SURGERY    . TOTAL HIP ARTHROPLASTY Left 10/21/2016   Procedure: LEFT TOTAL HIP ARTHROPLASTY ANTERIOR APPROACH;  Surgeon: Mcarthur Rossetti, MD;  Location: [facility];  Service: Orthopedics;  Laterality: Left;  . WRIST SURGERY Bilateral    carpal tunnel   Social History   Occupational History  . Occupation: braxton Therapist, art: [employer]  Tobacco Use  . Smoking status: Never Smoker  . Smokeless tobacco: Never Used  Substance and Sexual Activity  . Alcohol use: No  . Drug use: No  . Sexual activity: Not on file

## 2018-12-27 ENCOUNTER — Telehealth (INDEPENDENT_AMBULATORY_CARE_PROVIDER_SITE_OTHER): Payer: Self-pay

## 2018-12-27 ENCOUNTER — Other Ambulatory Visit (INDEPENDENT_AMBULATORY_CARE_PROVIDER_SITE_OTHER): Payer: Self-pay

## 2018-12-27 MED ORDER — TRAMADOL HCL 50 MG PO TABS: 50.0000 mg | ORAL_TABLET | Freq: Three times a day (TID) | ORAL | 2 refills | Status: DC | PRN

## 2018-12-27 NOTE — Telephone Encounter (Signed)
See other message

## 2018-12-27 NOTE — Telephone Encounter (Signed)
Natalie Bond at [facility] in [place] called stating that patient would like for Rx for Tramadol to [facility] [place] due to Tramadol being on back order at [facility].  Cb# for patient is [phone].  Please advise.  Thank You.

## 2018-12-27 NOTE — Telephone Encounter (Signed)
Vassie Moment at [facility] in [place] called stating that patient does not want the Rx for Hydromorphone.  CB#[phone].  Please advise.  Thank you.

## 2018-12-27 NOTE — Telephone Encounter (Signed)
This is Dr. Phoebe Sharps patient (Dr. Junius Roads just wrote that one Rx since Dr. Erlinda Hong was out of the office).

## 2018-12-27 NOTE — Telephone Encounter (Signed)
[facility]ed [facility] instead.

## 2019-01-03 DIAGNOSIS — R300 Dysuria: Secondary | ICD-10-CM | POA: Insufficient documentation

## 2019-01-04 ENCOUNTER — Telehealth (INDEPENDENT_AMBULATORY_CARE_PROVIDER_SITE_OTHER): Payer: Self-pay | Admitting: Orthopaedic Surgery

## 2019-01-04 NOTE — Telephone Encounter (Signed)
Patient left a message stating that [employer] told her that her paperwork is here in the office waiting for Dr. Erlinda Hong to sign off on her paperwork.  She is following up on that paperwork.  CB#[phone]

## 2019-01-08 NOTE — Telephone Encounter (Signed)
Jennie P. Called patient already. Paperwork ready for pick up.

## 2019-01-09 ENCOUNTER — Ambulatory Visit (INDEPENDENT_AMBULATORY_CARE_PROVIDER_SITE_OTHER): Payer: 59 | Admitting: Physician Assistant

## 2019-01-09 ENCOUNTER — Encounter (INDEPENDENT_AMBULATORY_CARE_PROVIDER_SITE_OTHER): Payer: Self-pay | Admitting: Orthopaedic Surgery

## 2019-01-09 DIAGNOSIS — M1811 Unilateral primary osteoarthritis of first carpometacarpal joint, right hand: Secondary | ICD-10-CM

## 2019-01-09 MED ORDER — IBUPROFEN 800 MG PO TABS: 800.0000 mg | ORAL_TABLET | Freq: Three times a day (TID) | ORAL | 2 refills | Status: DC | PRN

## 2019-01-09 MED ORDER — TRAMADOL HCL 50 MG PO TABS: 50.0000 mg | ORAL_TABLET | Freq: Four times a day (QID) | ORAL | 2 refills | Status: DC | PRN

## 2019-01-09 NOTE — Progress Notes (Signed)
Post-Op Visit Note   [name]           Date of Birth: 1956-09-27           MRN: [id number] Visit Date: 01/09/2019 PCP: Leanna Battles, MD   Assessment & Plan:  Chief Complaint:  Chief Complaint  Patient presents with  . Right Hand - Pain, Follow-up, Routine Post Op   Visit Diagnoses:  1. Primary osteoarthritis of first carpometacarpal joint of right hand     Plan: Patient is a pleasant 63 year old female who presents our clinic today 28 days status post right thumb CMC arthroplasty, date of surgery 12/12/2018.  She has been doing fairly well.  She has been compliant in her removable splint.  She has been taking tramadol and ibuprofen at night.  She still notes decreased sensation to the thumb, index and part of the long finger.  Examination of the right hand reveals a well-healed surgical incision without evidence of infection.  Slight swelling throughout.  Decreased sensation to the thumb, index and median side of the long finger.  At this point, we will refer the patient to hand therapy where they will also make a thermoplastic splint.  She will follow-up with [place] in 4 weeks time for recheck.  Follow-Up Instructions: Return in about 4 weeks (around 02/06/2019).   Orders:  No orders of the defined types were placed in this encounter.  Meds ordered this encounter  Medications  . traMADol (ULTRAM) 50 MG tablet    Sig: Take 1 tablet (50 mg total) by mouth every 6 (six) hours as needed.    Dispense:  30 tablet    Refill:  2  . ibuprofen (ADVIL,MOTRIN) 800 MG tablet    Sig: Take 1 tablet (800 mg total) by mouth every 8 (eight) hours as needed.    Dispense:  30 tablet    Refill:  2    Imaging: No new imaging  PMFS History: Patient Active Problem List   Diagnosis Date Noted  . Mixed conductive and sensorineural hearing loss of left ear with restricted hearing of right ear 04/26/2018  . Vertigo 04/26/2018  . Venous stasis dermatitis of left lower extremity  04/09/2018  . Achilles tendon contracture, left 04/09/2018  . Pain in left ankle and joints of left foot 04/09/2018  . Primary osteoarthritis of first carpometacarpal joint of right hand 10/09/2017  . Bilateral hand pain 10/05/2017  . Primary osteoarthritis of both first carpometacarpal joints 10/05/2017  . Trochanteric bursitis, right hip 06/29/2017  . Degenerative disc disease at L5-S1 level 05/04/2017  . History of joint replacement 05/04/2017  . Claw toe, acquired, right 04/27/2017  . Achilles tendon contracture, right 04/27/2017  . Pain in right foot 04/11/2017  . Status post left hip replacement 10/21/2016  . Unilateral primary osteoarthritis, left hip 09/23/2016    Class: Chronic  . Chronic back pain 01/14/2016  . Migraine 01/14/2016  . Essential hypertension 01/22/2015   Past Medical History:  Diagnosis Date  . Arthritis    back, wrists  . Cancer ([place])    skin cancer  . Complication of anesthesia   . Diabetes mellitus without complication ([place])    pt recently started on metformin  . GERD (gastroesophageal reflux disease)   . Headache   . High cholesterol   . History of bronchitis   . History of kidney stones    2015  . Hypertension   . PONV (postoperative nausea and vomiting)   . Sleep apnea  pt. does not use a CPAP or BiPAP  . Stroke ([place])    Pt. states possible mini stroke in 2014, no deficits    Family History  Problem Relation Age of Onset  . Cancer Mother   . Diabetes Mother   . Heart disease Mother   . Hyperlipidemia Mother   . Hypertension Mother   . Cancer Father   . Diabetes Father   . Heart disease Father   . Hyperlipidemia Father   . Hypertension Father   . Varicose Veins Father   . Cancer Sister   . Heart disease Sister   . Hyperlipidemia Sister   . Cancer Brother   . Diabetes Brother   . Hyperlipidemia Brother     Past Surgical History:  Procedure Laterality Date  . ABDOMINAL HYSTERECTOMY    . BACK SURGERY    . CARDIAC  CATHETERIZATION     01/27/15 [facility]): EF 60%, normal coronaries.  Mortimer Fries SUSPENSION PLASTY Right 12/12/2018   Procedure: RIGHT THUMB CARPOMETACARPAL ([place]) ARTHROPLASTY;  Surgeon: Leandrew Koyanagi, MD;  Location: [facility];  Service: Orthopedics;  Laterality: Right;  . CATARACT EXTRACTION, BILATERAL    . CHOLECYSTECTOMY  07/2016   done [place]  . EYE SURGERY    . HERNIA REPAIR Left 1990   left groin   . LOWER EXTREMITY ANGIOGRAM Left 06/08/2015   Procedure: Ascending Venogram  Iliac Vein ;  Surgeon: Elam Dutch, MD;  Location: [facility] OR;  Service: Vascular;  Laterality: Left;  ;  [facility]- [facility] / [facility] HAVE BEEN NOTIFIED/ CONFIRMED  . PARTIAL HYSTERECTOMY    . SHOULDER SURGERY    . TOTAL HIP ARTHROPLASTY Left 10/21/2016   Procedure: LEFT TOTAL HIP ARTHROPLASTY ANTERIOR APPROACH;  Surgeon: Mcarthur Rossetti, MD;  [facility];  Service: Orthopedics;  Laterality: Left;  . WRIST SURGERY Bilateral    carpal tunnel   Social History   Occupational History  . 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 Smoking status: Never Smoker  . Smokeless tobacco: Never Used  Substance and Sexual Activity  . Alcohol use: No  . Drug use: No  . Sexual activity: Not on file

## 2019-02-01 ENCOUNTER — Encounter (INDEPENDENT_AMBULATORY_CARE_PROVIDER_SITE_OTHER): Payer: Self-pay

## 2019-02-01 ENCOUNTER — Ambulatory Visit (INDEPENDENT_AMBULATORY_CARE_PROVIDER_SITE_OTHER): Payer: 59 | Admitting: Orthopaedic Surgery

## 2019-02-01 DIAGNOSIS — M1811 Unilateral primary osteoarthritis of first carpometacarpal joint, right hand: Secondary | ICD-10-CM

## 2019-02-01 MED ORDER — AMITRIPTYLINE HCL 25 MG PO TABS: 25.0000 mg | ORAL_TABLET | Freq: Every day | ORAL | 0 refills | Status: DC

## 2019-02-01 NOTE — Progress Notes (Signed)
Post-Op Visit Note   [name]           Date of Birth: 1956-10-18           MRN: [id number] Visit Date: 02/01/2019 PCP: Leanna Battles, MD   Assessment & Plan:  Visit Diagnoses:  1. Primary osteoarthritis of first carpometacarpal joint of right hand     Plan: Natalie Bond is 8 weeks status post right thumb CMC arthroplasty.  Overall she is improving and progressing with hand therapy.  She mainly complains of some mild constant pain around the incision that is worse with touching and pressure from the protective splint.  She states that overall the surgery has helped but she still feels weakness in her grip strength and discomfort at night when sleeping.  She takes tramadol and ibuprofen.  She also endorses some burning pain. Physical exam shows a fully healed surgical scar.  She is mildly tender along the surgical scar.  Her strength and range of motion of the thumb of both coming along nicely.  At this point we will discontinue the protective splint and continue with hand therapy for strengthening and scar desensitization.  Prescription for Elavil was provided today.  She needs to continue to improve with hand therapy before I feel comfortable releasing her back to work which is very physical and repetitive.  We will recheck in 6 weeks.  Follow-Up Instructions: Return in about 6 weeks (around 03/15/2019).   Orders:  No orders of the defined types were placed in this encounter.  Meds ordered this encounter  Medications  . amitriptyline (ELAVIL) 25 MG tablet    Sig: Take 1 tablet (25 mg total) by mouth at bedtime.    Dispense:  30 tablet    Refill:  0    Imaging: No results found.  PMFS History: Patient Active Problem List   Diagnosis Date Noted  . Mixed conductive and sensorineural hearing loss of left ear with restricted hearing of right ear 04/26/2018  . Vertigo 04/26/2018  . Venous stasis dermatitis of left lower extremity 04/09/2018  . Achilles tendon  contracture, left 04/09/2018  . Pain in left ankle and joints of left foot 04/09/2018  . Primary osteoarthritis of first carpometacarpal joint of right hand 10/09/2017  . Bilateral hand pain 10/05/2017  . Primary osteoarthritis of both first carpometacarpal joints 10/05/2017  . Trochanteric bursitis, right hip 06/29/2017  . Degenerative disc disease at L5-S1 level 05/04/2017  . History of joint replacement 05/04/2017  . Claw toe, acquired, right 04/27/2017  . Achilles tendon contracture, right 04/27/2017  . Pain in right foot 04/11/2017  . Status post left hip replacement 10/21/2016  . Unilateral primary osteoarthritis, left hip 09/23/2016    Class: Chronic  . Chronic back pain 01/14/2016  . Migraine 01/14/2016  . Essential hypertension 01/22/2015   Past Medical History:  Diagnosis Date  . Arthritis    back, wrists  . Cancer ([place])    skin cancer  . Complication of anesthesia   . Diabetes mellitus without complication ([place])    pt recently started on metformin  . GERD (gastroesophageal reflux disease)   . Headache   . High cholesterol   . History of bronchitis   . History of kidney stones    2015  . Hypertension   . PONV (postoperative nausea and vomiting)   . Sleep apnea    pt. does not use a CPAP or BiPAP  . Stroke ([place])    Pt. states possible mini stroke  in 2014, no deficits    Family History  Problem Relation Age of Onset  . Cancer Mother   . Diabetes Mother   . Heart disease Mother   . Hyperlipidemia Mother   . Hypertension Mother   . Cancer Father   . Diabetes Father   . Heart disease Father   . Hyperlipidemia Father   . Hypertension Father   . Varicose Veins Father   . Cancer Sister   . Heart disease Sister   . Hyperlipidemia Sister   . Cancer Brother   . Diabetes Brother   . Hyperlipidemia Brother     Past Surgical History:  Procedure Laterality Date  . ABDOMINAL HYSTERECTOMY    . BACK SURGERY    . CARDIAC CATHETERIZATION     01/27/15 [facility]): EF  60%, normal coronaries.  Mortimer Fries SUSPENSION PLASTY Right 12/12/2018   Procedure: RIGHT THUMB CARPOMETACARPAL ([place]) ARTHROPLASTY;  Surgeon: Leandrew Koyanagi, MD;  Location: [facility];  Service: Orthopedics;  Laterality: Right;  . CATARACT EXTRACTION, BILATERAL    . CHOLECYSTECTOMY  07/2016   done [place]  . EYE SURGERY    . HERNIA REPAIR Left 1990   left groin   . LOWER EXTREMITY ANGIOGRAM Left 06/08/2015   Procedure: Ascending Venogram  Iliac Vein ;  Surgeon: Elam Dutch, MD;  Location: [facility] OR;  Service: Vascular;  Laterality: Left;  ;  Natalie Bond- [facility] / [facility] HAVE BEEN NOTIFIED/ CONFIRMED  . PARTIAL HYSTERECTOMY    . SHOULDER SURGERY    . TOTAL HIP ARTHROPLASTY Left 10/21/2016   Procedure: LEFT TOTAL HIP ARTHROPLASTY ANTERIOR APPROACH;  Surgeon: Mcarthur Rossetti, MD;  [facility];  Service: Orthopedics;  Laterality: Left;  . WRIST SURGERY Bilateral    carpal tunnel   Social History   Occupational History  . 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 Smoking status: Never Smoker  . Smokeless tobacco: Never Used  Substance and Sexual Activity  . Alcohol use: No  . Drug use: No  . Sexual activity: Not on file

## 2019-02-25 ENCOUNTER — Encounter (INDEPENDENT_AMBULATORY_CARE_PROVIDER_SITE_OTHER): Payer: Self-pay | Admitting: Physical Medicine and Rehabilitation

## 2019-02-25 MED ORDER — BUPIVACAINE HCL 0.25 % IJ SOLN
4.0000 mL | INTRAMUSCULAR | Status: AC | PRN
  Administered 2018-11-13: 4 mL via INTRA_ARTICULAR

## 2019-02-25 MED ORDER — TRIAMCINOLONE ACETONIDE 40 MG/ML IJ SUSP
80.0000 mg | INTRAMUSCULAR | Status: AC | PRN
  Administered 2018-11-13: 80 mg via INTRA_ARTICULAR

## 2019-02-25 MED ORDER — LIDOCAINE HCL 2 % IJ SOLN
4.0000 mL | INTRAMUSCULAR | Status: AC | PRN
  Administered 2018-11-13: 4 mL

## 2019-02-27 DIAGNOSIS — L299 Pruritus, unspecified: Secondary | ICD-10-CM | POA: Insufficient documentation

## 2019-02-27 DIAGNOSIS — R21 Rash and other nonspecific skin eruption: Secondary | ICD-10-CM | POA: Insufficient documentation

## 2019-03-14 ENCOUNTER — Other Ambulatory Visit: Payer: Self-pay

## 2019-03-14 ENCOUNTER — Ambulatory Visit (INDEPENDENT_AMBULATORY_CARE_PROVIDER_SITE_OTHER): Payer: 59 | Admitting: Orthopaedic Surgery

## 2019-03-14 ENCOUNTER — Encounter (INDEPENDENT_AMBULATORY_CARE_PROVIDER_SITE_OTHER): Payer: Self-pay | Admitting: Orthopaedic Surgery

## 2019-03-14 DIAGNOSIS — M1811 Unilateral primary osteoarthritis of first carpometacarpal joint, right hand: Secondary | ICD-10-CM | POA: Diagnosis not present

## 2019-03-14 MED ORDER — AMITRIPTYLINE HCL 25 MG PO TABS: 25.0000 mg | ORAL_TABLET | Freq: Every day | ORAL | 3 refills | Status: DC

## 2019-03-14 NOTE — Progress Notes (Signed)
Office Visit Note   [name]           Date of Birth: February 02, 1956           MRN: [id number] Visit Date: 03/14/2019              Requested by: Leanna Battles, MD [place], [place] [address] PCP: Leanna Battles, MD   Assessment & Plan: Visit Diagnoses:  1. Primary osteoarthritis of first carpometacarpal joint of right hand     Plan: Ranya is recovering well from the surgery I do think that she is having some irritation from the superficial radial nerve.  She was taking Elavil at one point which did help but she has stopped taking this but she will resume this.  I have refilled this medication for her.  Overall she states that her pain is significantly improved but based on her progress and her job requirements cutting fabric I am guarded in my belief that she will be able to return back to the her previous job given how physical and strenuous it is on her hands.  Once she has finished hand therapy I will be able to make a determination as to whether she can return to her job or not but for now she is not able to and she needs to continue to stay out of work until her next follow-up which will be sometime after she completes hand therapy.  Follow-Up Instructions: Return if symptoms worsen or fail to improve.   Orders:  No orders of the defined types were placed in this encounter.  Meds ordered this encounter  Medications  . amitriptyline (ELAVIL) 25 MG tablet    Sig: Take 1 tablet (25 mg total) by mouth at bedtime.    Dispense:  30 tablet    Refill:  3      Procedures: No procedures performed   Clinical Data: No additional findings.   Subjective: Chief Complaint  Patient presents with  . Right Hand - Follow-up    Natalie Bond is 92 days status post right thumb CMC arthroplasty.  Overall she states that she is doing well but she has occasional sharp shooting pain along the superficial radial nerve distribution.  She states that the pain has  significantly improved since the surgery but her strength is still weak and she definitely notices increased pain and symptoms with repetitive or prolonged use of the right hand.  She continues to progress with hand therapy.   Review of Systems  Constitutional: Negative.   HENT: Negative.   Eyes: Negative.   Respiratory: Negative.   Cardiovascular: Negative.   Endocrine: Negative.   Musculoskeletal: Negative.   Neurological: Negative.   Hematological: Negative.   Psychiatric/Behavioral: Negative.   All other systems reviewed and are negative.    Objective: Vital Signs: There were no vitals taken for this visit.  Physical Exam Vitals signs and nursing note reviewed.  Constitutional:      Appearance: She is well-developed.  Pulmonary:     Effort: Pulmonary effort is normal.  Skin:    General: Skin is warm.     Capillary Refill: Capillary refill takes less than 2 seconds.  Neurological:     Mental Status: She is alert and oriented to person, place, and time.  Psychiatric:        Behavior: Behavior normal.        Thought Content: Thought content normal.        Judgment: Judgment normal.  Ortho Exam Right hand exam shows a fully healed surgical scar.  She does have some paresthesias in the superficial radial nerve distribution.  She does not have any evidence of allodynia or hyperalgesia.  Mild Tinel's at the superficial radial nerve.  Her grip strength and pinch strength are markedly decreased compared to the left hand. Specialty Comments:  No specialty comments available.  Imaging: No results found.   PMFS History: Patient Active Problem List   Diagnosis Date Noted  . Mixed conductive and sensorineural hearing loss of left ear with restricted hearing of right ear 04/26/2018  . Vertigo 04/26/2018  . Venous stasis dermatitis of left lower extremity 04/09/2018  . Achilles tendon contracture, left 04/09/2018  . Pain in left ankle and joints of left foot 04/09/2018   . Primary osteoarthritis of first carpometacarpal joint of right hand 10/09/2017  . Bilateral hand pain 10/05/2017  . Primary osteoarthritis of both first carpometacarpal joints 10/05/2017  . Trochanteric bursitis, right hip 06/29/2017  . Degenerative disc disease at L5-S1 level 05/04/2017  . History of joint replacement 05/04/2017  . Claw toe, acquired, right 04/27/2017  . Achilles tendon contracture, right 04/27/2017  . Pain in right foot 04/11/2017  . Status post left hip replacement 10/21/2016  . Unilateral primary osteoarthritis, left hip 09/23/2016    Class: Chronic  . Chronic back pain 01/14/2016  . Migraine 01/14/2016  . Essential hypertension 01/22/2015   Past Medical History:  Diagnosis Date  . Arthritis    back, wrists  . Cancer ([place])    skin cancer  . Complication of anesthesia   . Diabetes mellitus without complication ([place])    pt recently started on metformin  . GERD (gastroesophageal reflux disease)   . Headache   . High cholesterol   . History of bronchitis   . History of kidney stones    2015  . Hypertension   . PONV (postoperative nausea and vomiting)   . Sleep apnea    pt. does not use a CPAP or BiPAP  . Stroke ([place])    Pt. states possible mini stroke in 2014, no deficits    Family History  Problem Relation Age of Onset  . Cancer Mother   . Diabetes Mother   . Heart disease Mother   . Hyperlipidemia Mother   . Hypertension Mother   . Cancer Father   . Diabetes Father   . Heart disease Father   . Hyperlipidemia Father   . Hypertension Father   . Varicose Veins Father   . Cancer Sister   . Heart disease Sister   . Hyperlipidemia Sister   . Cancer Brother   . Diabetes Brother   . Hyperlipidemia Brother     Past Surgical History:  Procedure Laterality Date  . ABDOMINAL HYSTERECTOMY    . BACK SURGERY    . CARDIAC CATHETERIZATION     01/27/15 [facility]): EF 60%, normal coronaries.  Mortimer Fries SUSPENSION PLASTY Right 12/12/2018    Procedure: RIGHT THUMB CARPOMETACARPAL ([place]) ARTHROPLASTY;  Surgeon: Leandrew Koyanagi, MD;  Location: [facility];  Service: Orthopedics;  Laterality: Right;  . CATARACT EXTRACTION, BILATERAL    . CHOLECYSTECTOMY  07/2016   done [place]  . EYE SURGERY    . HERNIA REPAIR Left 1990   left groin   . LOWER EXTREMITY ANGIOGRAM Left 06/08/2015   Procedure: Ascending Venogram  Iliac Vein ;  Surgeon: Elam Dutch, MD;  Location: [place] [facility];  Service: Vascular;  Laterality: Left;  ;  TARA- [facility] / ZACH-[facility] HAVE BEEN NOTIFIED/ CONFIRMED  . PARTIAL HYSTERECTOMY    . SHOULDER SURGERY    . TOTAL HIP ARTHROPLASTY Left 10/21/2016   Procedure: LEFT TOTAL HIP ARTHROPLASTY ANTERIOR APPROACH;  Surgeon: Mcarthur Rossetti, MD;  Location: [facility];  Service: Orthopedics;  Laterality: Left;  . WRIST SURGERY Bilateral    carpal tunnel   Social History   Occupational History  . Occupation: braxton Therapist, art: [employer]  Tobacco Use  . Smoking status: Never Smoker  . Smokeless tobacco: Never Used  Substance and Sexual Activity  . Alcohol use: No  . Drug use: No  . Sexual activity: Not on file

## 2019-03-15 ENCOUNTER — Ambulatory Visit (INDEPENDENT_AMBULATORY_CARE_PROVIDER_SITE_OTHER): Payer: 59 | Admitting: Orthopaedic Surgery

## 2019-04-02 ENCOUNTER — Telehealth: Payer: Self-pay | Admitting: Physical Medicine and Rehabilitation

## 2019-04-02 NOTE — Telephone Encounter (Signed)
If last helped then ok

## 2019-04-04 NOTE — Telephone Encounter (Signed)
Left message #1 for patient to call to discuss/ schedule.

## 2019-04-04 NOTE — Telephone Encounter (Signed)
Patient states that she was notified this morning that she has lost her job and no longer has insurance. I told her that we could see her as self pay or she can find our about COBRA coverage. She scheduled an appointment for 6/1 at 0815, and she will let me know if she needs to cancel.

## 2019-04-16 ENCOUNTER — Encounter: Payer: Self-pay | Admitting: Orthopaedic Surgery

## 2019-04-16 ENCOUNTER — Other Ambulatory Visit: Payer: Self-pay

## 2019-04-16 ENCOUNTER — Ambulatory Visit: Payer: 59 | Admitting: Orthopaedic Surgery

## 2019-04-16 DIAGNOSIS — M1811 Unilateral primary osteoarthritis of first carpometacarpal joint, right hand: Secondary | ICD-10-CM

## 2019-04-16 NOTE — Progress Notes (Signed)
Office Visit Note   [name]           Date of Birth: 1955/12/27           MRN: [id number] Visit Date: 04/16/2019              Requested by: Leanna Battles, MD [place], [place] [address] PCP: Leanna Battles, MD   Assessment & Plan: Visit Diagnoses:  1. Arthritis of carpometacarpal (CMC) joint of right thumb     Plan: Impression is 4 months status post right thumb CMC arthroplasty.  Unfortunately she is still not at a point where she can return back to her previous job given how physical the demands are.  It is unfortunate that she cannot attend any more hand therapy sessions due to the cost.  She will continue to work really hard with her home exercises on this.  I will continue to keep her out of work for another 6 weeks so that I can reevaluate at that time.  She has been through a lot in terms of musculoskeletal ailments and she is currently applying for disability.  Only see her next time we may be able to rate and release her.  She is in agreement with the plan.  Questions encouraged and answered.  Follow-Up Instructions: Return in about 6 weeks (around 05/28/2019), or if symptoms worsen or fail to improve.   Orders:  No orders of the defined types were placed in this encounter.  No orders of the defined types were placed in this encounter.     Procedures: No procedures performed   Clinical Data: No additional findings.   Subjective: Chief Complaint  Patient presents with  . Right Thumb - Pain    [name] is 4 months status post right thumb CMC arthroplasty.  She is overall doing well in terms of pain.  She still notices market weakness in her pinch and grip strength.  She has since been laid off from her job and therefore she cannot afford to attend hand therapy.  She does have cramping in her hands with increased use.   Review of Systems  Constitutional: Negative.   HENT: Negative.   Eyes: Negative.   Respiratory: Negative.    Cardiovascular: Negative.   Endocrine: Negative.   Musculoskeletal: Negative.   Neurological: Negative.   Hematological: Negative.   Psychiatric/Behavioral: Negative.   All other systems reviewed and are negative.    Objective: Vital Signs: There were no vitals taken for this visit.  Physical Exam Vitals signs and nursing note reviewed.  Constitutional:      Appearance: She is well-developed.  Pulmonary:     Effort: Pulmonary effort is normal.  Skin:    General: Skin is warm.     Capillary Refill: Capillary refill takes less than 2 seconds.  Neurological:     Mental Status: She is alert and oriented to person, place, and time.  Psychiatric:        Behavior: Behavior normal.        Thought Content: Thought content normal.        Judgment: Judgment normal.     Ortho Exam Right hand exam shows a fully healed surgical scar.  Her grip and pinch strength are still noticeably weaker than the contralateral side.  No signs of infection.  She has less sensitivity with light touch to the skin now. Specialty Comments:  No specialty comments available.  Imaging: No results found.   PMFS History: Patient Active  Problem List   Diagnosis Date Noted  . Arthritis of carpometacarpal [facility]) joint of right thumb 04/16/2019  . Mixed conductive and sensorineural hearing loss of left ear with restricted hearing of right ear 04/26/2018  . Vertigo 04/26/2018  . Venous stasis dermatitis of left lower extremity 04/09/2018  . Achilles tendon contracture, left 04/09/2018  . Pain in left ankle and joints of left foot 04/09/2018  . Primary osteoarthritis of first carpometacarpal joint of right hand 10/09/2017  . Bilateral hand pain 10/05/2017  . Primary osteoarthritis of both first carpometacarpal joints 10/05/2017  . Trochanteric bursitis, right hip 06/29/2017  . Degenerative disc disease at L5-S1 level 05/04/2017  . History of joint replacement 05/04/2017  . Claw toe, acquired, right  04/27/2017  . Achilles tendon contracture, right 04/27/2017  . Pain in right foot 04/11/2017  . Status post left hip replacement 10/21/2016  . Unilateral primary osteoarthritis, left hip 09/23/2016    Class: Chronic  . Chronic back pain 01/14/2016  . Migraine 01/14/2016  . Essential hypertension 01/22/2015   Past Medical History:  Diagnosis Date  . Arthritis    back, wrists  . Cancer ([place])    skin cancer  . Complication of anesthesia   . Diabetes mellitus without complication ([place])    pt recently started on metformin  . GERD (gastroesophageal reflux disease)   . Headache   . High cholesterol   . History of bronchitis   . History of kidney stones    2015  . Hypertension   . PONV (postoperative nausea and vomiting)   . Sleep apnea    pt. does not use a CPAP or BiPAP  . Stroke ([place])    Pt. states possible mini stroke in 2014, no deficits    Family History  Problem Relation Age of Onset  . Cancer Mother   . Diabetes Mother   . Heart disease Mother   . Hyperlipidemia Mother   . Hypertension Mother   . Cancer Father   . Diabetes Father   . Heart disease Father   . Hyperlipidemia Father   . Hypertension Father   . Varicose Veins Father   . Cancer Sister   . Heart disease Sister   . Hyperlipidemia Sister   . Cancer Brother   . Diabetes Brother   . Hyperlipidemia Brother     Past Surgical History:  Procedure Laterality Date  . ABDOMINAL HYSTERECTOMY    . BACK SURGERY    . CARDIAC CATHETERIZATION     01/27/15 [facility]): EF 60%, normal coronaries.  Mortimer Fries SUSPENSION PLASTY Right 12/12/2018   Procedure: RIGHT THUMB CARPOMETACARPAL ([place]) ARTHROPLASTY;  Surgeon: Leandrew Koyanagi, MD;  Location: [place];  Service: Orthopedics;  Laterality: Right;  . CATARACT EXTRACTION, BILATERAL    . CHOLECYSTECTOMY  07/2016   done [place]  . EYE SURGERY    . HERNIA REPAIR Left 1990   left groin   . LOWER EXTREMITY ANGIOGRAM Left 06/08/2015   Procedure:  Ascending Venogram  Iliac Vein ;  Surgeon: Elam Dutch, MD;  Location: [facility];  Service: Vascular;  Laterality: Left;  ;  TARA- [facility]IFIC / ZACH-[place] HAVE BEEN NOTIFIED/ CONFIRMED  . PARTIAL HYSTERECTOMY    . SHOULDER SURGERY    . TOTAL HIP ARTHROPLASTY Left 10/21/2016   Procedure: LEFT TOTAL HIP ARTHROPLASTY ANTERIOR APPROACH;  Surgeon: Mcarthur Rossetti, MD;  [facility];  Service: Orthopedics;  Laterality: Left;  . WRIST SURGERY Bilateral    carpal  tunnel   Social History   Occupational History  . Occupation: braxton Therapist, art: [employer]  Tobacco Use  . Smoking status: Never Smoker  . Smokeless tobacco: Never Used  Substance and Sexual Activity  . Alcohol use: No  . Drug use: No  . Sexual activity: Not on file

## 2019-04-29 ENCOUNTER — Encounter: Payer: 59 | Admitting: Physical Medicine and Rehabilitation

## 2019-05-01 ENCOUNTER — Telehealth: Payer: Self-pay

## 2019-05-01 NOTE — Telephone Encounter (Signed)
Natalie Bond is asking for a returned call from Dr Erlinda Hong Nurse about Vocational Therapy please advise?

## 2019-05-03 ENCOUNTER — Other Ambulatory Visit: Payer: Self-pay

## 2019-05-03 ENCOUNTER — Encounter: Payer: Self-pay | Admitting: Physical Medicine and Rehabilitation

## 2019-05-03 ENCOUNTER — Ambulatory Visit: Payer: Self-pay

## 2019-05-03 ENCOUNTER — Ambulatory Visit: Payer: 59 | Admitting: Physical Medicine and Rehabilitation

## 2019-05-03 VITALS — BP 126/95 | HR 90

## 2019-05-03 DIAGNOSIS — M47816 Spondylosis without myelopathy or radiculopathy, lumbar region: Secondary | ICD-10-CM

## 2019-05-03 MED ORDER — METHYLPREDNISOLONE ACETATE 80 MG/ML IJ SUSP
80.0000 mg | Freq: Once | INTRAMUSCULAR | Status: AC
  Administered 2019-05-03: 80 mg

## 2019-05-03 NOTE — Telephone Encounter (Signed)
I called her and told her to have the VR guy call us directly

## 2019-05-03 NOTE — Telephone Encounter (Signed)
Called patient to see what she needed.  Disability guy that sends her checks states she can do vocational rehab but would like to know if she should say no since you and her discussed she can do home exercises.  Would like to know how to proceed.    CB [phone]

## 2019-05-03 NOTE — Progress Notes (Signed)
 .  Numeric Pain Rating Scale and Functional Assessment Average Pain 8   In the last MONTH (on 0-10 scale) has pain interfered with the following?  1. General activity like being  able to carry out your everyday physical activities such as walking, climbing stairs, carrying groceries, or moving a chair?  Rating(6)   +Driver, -BT, -Dye Allergies.  

## 2019-05-06 NOTE — Progress Notes (Addendum)
Natalie Bond - 63 y.o. female MRN [id number]  Date of birth: Aug 06, 1956  Office Visit Note: Visit Date: 05/03/2019 PCP: Leanna Battles, MD Referred by: Leanna Battles, MD  Subjective: Chief Complaint  Patient presents with  . Lower Back - Pain   HPI:  Natalie Bond is a 63 y.o. female who comes in today Comes in for planned left L4-5 facet joint block.  Patient has had an interesting course in terms of her overall body pain and she has had multiple musculoskeletal complaints and injuries and surgery.  She has had a prior laminectomy.  We updated MRI back in January and really shows mostly facet arthropathy without any focal nerve compression and actually regression of prior disc protrusion and no real scar tissue from the prior surgery.  She has this swelling of the left leg that she refers to as weeping and swelling and again shows me pictures of what looks like venous stasis disease and we can see that today.  She has had multiple vascular work-up.  I cannot seem to convince her that this is not related to her spine although every time we do a spine injection she tells me that the swelling in the leg decreases tremendously.  I can only assume at this point that maybe this is a steroid effect somehow.  We have done epidural injection and sympathetic block and facet block and they all seem to help.  She is unfortunately out of work at this point.  We will complete the facet block and to see how she does.  ROS Otherwise per HPI.  Assessment & Plan: Visit Diagnoses:  1. Spondylosis without myelopathy or radiculopathy, lumbar region     Plan: No additional findings.   Meds & Orders:  Meds ordered this encounter  Medications  . methylPREDNISolone acetate (DEPO-MEDROL) injection 80 mg    Orders Placed This Encounter  Procedures  . Facet Injection  . XR C-ARM NO REPORT    Follow-up: Return if symptoms worsen or fail to improve.   Procedures: No procedures performed  Lumbar Facet  Joint Intra-Articular Injection(s) with Fluoroscopic Guidance  [name]      Date of Birth: Nov 17, 1956 MRN: [id number] PCP: Leanna Battles, MD      Visit Date: 05/03/2019   Universal Protocol:    Date/Time: 05/03/2019  Consent Given By: the patient  Position: PRONE   Additional Comments: Vital signs were monitored before and after the procedure. Patient was prepped and draped in the usual sterile fashion. The correct patient, procedure, and site was verified.   Injection Procedure Details:  Procedure Site One Meds Administered:  Meds ordered this encounter  Medications  . methylPREDNISolone acetate (DEPO-MEDROL) injection 80 mg     Laterality: Left  Location/Site:  L4-L5  Needle size: 22 guage  Needle type: Spinal  Needle Placement: Articular  Findings:  -Comments: Excellent flow of contrast producing a partial arthrogram.  Procedure Details: The fluoroscope beam is vertically oriented in AP, and the inferior recess is visualized beneath the lower pole of the inferior apophyseal process, which represents the target point for needle insertion. When direct visualization is difficult the target point is located at the medial projection of the vertebral pedicle. The region overlying each aforementioned target is locally anesthetized with a 1 to 2 ml. volume of 1% Lidocaine without Epinephrine.   The spinal needle was inserted into each of the above mentioned facet joints using biplanar fluoroscopic guidance. A 0.25 to 0.5 ml. volume  of Isovue-250 was injected and a partial facet joint arthrogram was obtained. A single spot film was obtained of the resulting arthrogram.    One to 1.25 ml of the steroid/anesthetic solution was then injected into each of the facet joints noted above.   Additional Comments:  The patient tolerated the procedure well Dressing: 2 x 2 sterile gauze and Band-Aid    Post-procedure details: Patient was observed during the  procedure. Post-procedure instructions were reviewed.  Patient left the clinic in stable condition.    Clinical History: MRI LUMBAR SPINE WITHOUT CONTRAST  TECHNIQUE: Multiplanar, multisequence MR imaging of the lumbar spine was performed. No intravenous contrast was administered.  COMPARISON:  12/18/2015 lumbar spine MRI.  FINDINGS: Segmentation:  Standard.  Alignment: Mild lumbar levocurvature with apex at L3. Normal lumbar lordosis without listhesis.  Vertebrae: No fracture, evidence of discitis, or bone lesion. Right-sided L4-5 facet edema.  Conus medullaris and cauda equina: Conus extends to the L1-2 level. Conus and cauda equina appear normal.  Paraspinal and other soft tissues: Chronic postsurgical changes related to left L5-S1 hemilaminectomy. No fluid collection.  Disc levels:  T12-L1: No significant disc displacement, foraminal stenosis, or canal stenosis.  L1-2: No significant disc displacement, foraminal stenosis, or canal stenosis.  L2-3: Stable small left foraminal disc protrusion. No significant foraminal or canal stenosis.  L3-4: Stable disc bulge and facet hypertrophy. Interval resorption of right juxta-articular cyst. Mild bilateral recess stenosis. No foraminal or canal stenosis.  L4-5: Stable disc bulge, facet hypertrophy, and ligamentum flavum hypertrophy, greater on the left. Mild left lateral recess stenosis. No foraminal or canal stenosis.  L5-S1: Left hemilaminectomy. Stable left central disc protrusion with mild left lateral recess stenosis. No significant foraminal or canal stenosis.  IMPRESSION: 1. No acute fracture.  Mild levocurvature of the lumbar spine. 2. Right L4-5 facet edema, likely degenerative. 3. Stable lumbar spondylosis. No significant foraminal or canal stenosis.   Electronically Signed   By: Kristine Garbe M.D.   On: 12/01/2018 14:02     Objective:  VS:  HT:    WT:   BMI:      BP:(!) 126/95  HR:90bpm  TEMP: ( )  RESP:  Physical Exam  Ortho Exam Imaging: No results found.

## 2019-05-06 NOTE — Procedures (Signed)
Lumbar Facet Joint Intra-Articular Injection(s) with Fluoroscopic Guidance  [name]      Date of Birth: Dec 15, 1955 MRN: [id number] PCP: Leanna Battles, MD      Visit Date: 05/03/2019   Universal Protocol:    Date/Time: 05/03/2019  Consent Given By: the patient  Position: PRONE   Additional Comments: Vital signs were monitored before and after the procedure. Patient was prepped and draped in the usual sterile fashion. The correct patient, procedure, and site was verified.   Injection Procedure Details:  Procedure Site One Meds Administered:  Meds ordered this encounter  Medications  . methylPREDNISolone acetate (DEPO-MEDROL) injection 80 mg     Laterality: Left  Location/Site:  L4-L5  Needle size: 22 guage  Needle type: Spinal  Needle Placement: Articular  Findings:  -Comments: Excellent flow of contrast producing a partial arthrogram.  Procedure Details: The fluoroscope beam is vertically oriented in AP, and the inferior recess is visualized beneath the lower pole of the inferior apophyseal process, which represents the target point for needle insertion. When direct visualization is difficult the target point is located at the medial projection of the vertebral pedicle. The region overlying each aforementioned target is locally anesthetized with a 1 to 2 ml. volume of 1% Lidocaine without Epinephrine.   The spinal needle was inserted into each of the above mentioned facet joints using biplanar fluoroscopic guidance. A 0.25 to 0.5 ml. volume of Isovue-250 was injected and a partial facet joint arthrogram was obtained. A single spot film was obtained of the resulting arthrogram.    One to 1.25 ml of the steroid/anesthetic solution was then injected into each of the facet joints noted above.   Additional Comments:  The patient tolerated the procedure well Dressing: 2 x 2 sterile gauze and Band-Aid    Post-procedure details: Patient was observed  during the procedure. Post-procedure instructions were reviewed.  Patient left the clinic in stable condition.

## 2019-05-14 ENCOUNTER — Other Ambulatory Visit: Payer: Self-pay

## 2019-05-14 ENCOUNTER — Encounter: Payer: Self-pay | Admitting: Orthopaedic Surgery

## 2019-05-14 ENCOUNTER — Ambulatory Visit (INDEPENDENT_AMBULATORY_CARE_PROVIDER_SITE_OTHER): Payer: PRIVATE HEALTH INSURANCE | Admitting: Orthopaedic Surgery

## 2019-05-14 DIAGNOSIS — M1811 Unilateral primary osteoarthritis of first carpometacarpal joint, right hand: Secondary | ICD-10-CM | POA: Diagnosis not present

## 2019-05-14 NOTE — Progress Notes (Signed)
Office Visit Note   [name]           Date of Birth: [birth year]           MRN: [id number] Visit Date: 05/14/2019              Requested by: Leanna Battles, MD [place],  [place] [address] PCP: Leanna Battles, MD   Assessment & Plan: Visit Diagnoses:  1. Arthritis of carpometacarpal (CMC) joint of right thumb     Plan: At this point Natalie Bond has reached maximum medical improvement.  Unfortunately she is unable to return back to the same job that she was performing before with how her right hand is.  She is very happy with the fact that she no longer has pain at rest but due to the physical nature of her previous job she is just not able to return back to that same level of hand function therefore she is currently applying for permanent Social Security disability.  I am happy to help her process in any way I can.  Questions encouraged and answered.  Follow-up as needed.  Follow-Up Instructions: Return if symptoms worsen or fail to improve.   Orders:  No orders of the defined types were placed in this encounter.  No orders of the defined types were placed in this encounter.     Procedures: No procedures performed   Clinical Data: No additional findings.   Subjective: Chief Complaint  Patient presents with  . Right Hand - Follow-up  . Thumb    Natalie Bond is a 63 year old who follows up today for her right hand and thumb pain.  She reports no pain at rest.  She only has pain with use of her hand for grasping and pinching and gripping.   Review of Systems   Objective: Vital Signs: There were no vitals taken for this visit.  Physical Exam  Ortho Exam Right hand exam is unchanged. Specialty Comments:  No specialty comments available.  Imaging: No results found.   PMFS History: Patient Active Problem List   Diagnosis Date Noted  . Arthritis of carpometacarpal [facility]) joint of right thumb 04/16/2019  . Mixed conductive and sensorineural  hearing loss of left ear with restricted hearing of right ear 04/26/2018  . Vertigo 04/26/2018  . Venous stasis dermatitis of left lower extremity 04/09/2018  . Achilles tendon contracture, left 04/09/2018  . Pain in left ankle and joints of left foot 04/09/2018  . Primary osteoarthritis of first carpometacarpal joint of right hand 10/09/2017  . Bilateral hand pain 10/05/2017  . Primary osteoarthritis of both first carpometacarpal joints 10/05/2017  . Trochanteric bursitis, right hip 06/29/2017  . Degenerative disc disease at L5-S1 level 05/04/2017  . History of joint replacement 05/04/2017  . Claw toe, acquired, right 04/27/2017  . Achilles tendon contracture, right 04/27/2017  . Pain in right foot 04/11/2017  . Status post left hip replacement 10/21/2016  . Unilateral primary osteoarthritis, left hip 09/23/2016    Class: Chronic  . Chronic back pain 01/14/2016  . Migraine 01/14/2016  . Essential hypertension 01/22/2015   Past Medical History:  Diagnosis Date  . Arthritis    back, wrists  . Cancer ([place])    skin cancer  . Complication of anesthesia   . Diabetes mellitus without complication ([place])    pt recently started on metformin  . GERD (gastroesophageal reflux disease)   . Headache   . High cholesterol   . History of bronchitis   .  History of kidney stones    2015  . Hypertension   . PONV (postoperative nausea and vomiting)   . Sleep apnea    pt. does not use a CPAP or BiPAP  . Stroke ([place])    Pt. states possible mini stroke in 2014, no deficits    Family History  Problem Relation Age of Onset  . Cancer Mother   . Diabetes Mother   . Heart disease Mother   . Hyperlipidemia Mother   . Hypertension Mother   . Cancer Father   . Diabetes Father   . Heart disease Father   . Hyperlipidemia Father   . Hypertension Father   . Varicose Veins Father   . Cancer Sister   . Heart disease Sister   . Hyperlipidemia Sister   . Cancer Brother   . Diabetes Brother   .  Hyperlipidemia Brother     Past Surgical History:  Procedure Laterality Date  . ABDOMINAL HYSTERECTOMY    . BACK SURGERY    . CARDIAC CATHETERIZATION     01/27/15 [facility]): EF 60%, normal coronaries.  Mortimer Fries SUSPENSION PLASTY Right 12/12/2018   Procedure: RIGHT THUMB CARPOMETACARPAL ([place]) ARTHROPLASTY;  Surgeon: Leandrew Koyanagi, MD;  Location: [place];  Service: Orthopedics;  Laterality: Right;  . CATARACT EXTRACTION, BILATERAL    . CHOLECYSTECTOMY  07/2016   done [place]  . EYE SURGERY    . HERNIA REPAIR Left 1990   left groin   . LOWER EXTREMITY ANGIOGRAM Left 06/08/2015   Procedure: Ascending Venogram  Iliac Vein ;  Surgeon: Elam Dutch, MD;  Location: [facility] OR;  Service: Vascular;  Laterality: Left;  ;  TARA- [facility] / ZACH-[facility] HAVE BEEN NOTIFIED/ CONFIRMED  . PARTIAL HYSTERECTOMY    . SHOULDER SURGERY    . TOTAL HIP ARTHROPLASTY Left 10/21/2016   Procedure: LEFT TOTAL HIP ARTHROPLASTY ANTERIOR APPROACH;  Surgeon: Mcarthur Rossetti, MD;  Location: [facility]S;  Service: Orthopedics;  Laterality: Left;  . WRIST SURGERY Bilateral    carpal tunnel   Social History   Occupational History  . Occupation: [employer] Therapist, art: [place]  Tobacco Use  . Smoking status: Never Smoker  . Smokeless tobacco: Never Used  Substance and Sexual Activity  . Alcohol use: No  . Drug use: No  . Sexual activity: Not on file

## 2019-05-23 ENCOUNTER — Ambulatory Visit: Payer: 59 | Admitting: Orthopaedic Surgery

## 2019-05-27 ENCOUNTER — Emergency Department (HOSPITAL_COMMUNITY): Payer: PRIVATE HEALTH INSURANCE

## 2019-05-27 ENCOUNTER — Other Ambulatory Visit: Payer: Self-pay

## 2019-05-27 ENCOUNTER — Observation Stay (HOSPITAL_COMMUNITY): Payer: PRIVATE HEALTH INSURANCE

## 2019-05-27 ENCOUNTER — Observation Stay (HOSPITAL_COMMUNITY)
Admission: EM | Admit: 2019-05-27 | Discharge: 2019-05-29 | Disposition: A | Discharge status: Home / Self Care | Payer: PRIVATE HEALTH INSURANCE | Attending: Internal Medicine | Admitting: Internal Medicine

## 2019-05-27 ENCOUNTER — Encounter (HOSPITAL_COMMUNITY): Payer: Self-pay | Admitting: Emergency Medicine

## 2019-05-27 DIAGNOSIS — Z79899 Other long term (current) drug therapy: Secondary | ICD-10-CM | POA: Diagnosis not present

## 2019-05-27 DIAGNOSIS — R531 Weakness: Secondary | ICD-10-CM | POA: Insufficient documentation

## 2019-05-27 DIAGNOSIS — M549 Dorsalgia, unspecified: Secondary | ICD-10-CM | POA: Diagnosis not present

## 2019-05-27 DIAGNOSIS — E119 Type 2 diabetes mellitus without complications: Secondary | ICD-10-CM

## 2019-05-27 DIAGNOSIS — G8929 Other chronic pain: Secondary | ICD-10-CM | POA: Diagnosis not present

## 2019-05-27 DIAGNOSIS — E041 Nontoxic single thyroid nodule: Secondary | ICD-10-CM | POA: Diagnosis not present

## 2019-05-27 DIAGNOSIS — Z85828 Personal history of other malignant neoplasm of skin: Secondary | ICD-10-CM | POA: Diagnosis not present

## 2019-05-27 DIAGNOSIS — I10 Essential (primary) hypertension: Secondary | ICD-10-CM | POA: Diagnosis not present

## 2019-05-27 DIAGNOSIS — G43909 Migraine, unspecified, not intractable, without status migrainosus: Secondary | ICD-10-CM | POA: Insufficient documentation

## 2019-05-27 DIAGNOSIS — Z20828 Contact with and (suspected) exposure to other viral communicable diseases: Secondary | ICD-10-CM | POA: Diagnosis not present

## 2019-05-27 DIAGNOSIS — R29898 Other symptoms and signs involving the musculoskeletal system: Secondary | ICD-10-CM | POA: Diagnosis present

## 2019-05-27 DIAGNOSIS — K219 Gastro-esophageal reflux disease without esophagitis: Secondary | ICD-10-CM | POA: Diagnosis not present

## 2019-05-27 DIAGNOSIS — R42 Dizziness and giddiness: Secondary | ICD-10-CM | POA: Diagnosis not present

## 2019-05-27 DIAGNOSIS — I639 Cerebral infarction, unspecified: Secondary | ICD-10-CM | POA: Insufficient documentation

## 2019-05-27 DIAGNOSIS — M542 Cervicalgia: Secondary | ICD-10-CM

## 2019-05-27 DIAGNOSIS — E785 Hyperlipidemia, unspecified: Secondary | ICD-10-CM | POA: Insufficient documentation

## 2019-05-27 DIAGNOSIS — Z8673 Personal history of transient ischemic attack (TIA), and cerebral infarction without residual deficits: Secondary | ICD-10-CM | POA: Diagnosis not present

## 2019-05-27 DIAGNOSIS — M4310 Spondylolisthesis, site unspecified: Secondary | ICD-10-CM

## 2019-05-27 DIAGNOSIS — Z7982 Long term (current) use of aspirin: Secondary | ICD-10-CM | POA: Insufficient documentation

## 2019-05-27 DIAGNOSIS — E1140 Type 2 diabetes mellitus with diabetic neuropathy, unspecified: Secondary | ICD-10-CM | POA: Insufficient documentation

## 2019-05-27 DIAGNOSIS — G459 Transient cerebral ischemic attack, unspecified: Secondary | ICD-10-CM | POA: Diagnosis present

## 2019-05-27 DIAGNOSIS — R2990 Unspecified symptoms and signs involving the nervous system: Secondary | ICD-10-CM

## 2019-05-27 LAB — PROTIME-INR
INR: 1 (ref 0.8–1.2)
Prothrombin Time: 13.1 seconds (ref 11.4–15.2)

## 2019-05-27 LAB — DIFFERENTIAL
Abs Immature Granulocytes: 0.04 10*3/uL (ref 0.00–0.07)
Basophils Absolute: 0.1 10*3/uL (ref 0.0–0.1)
Basophils Relative: 1 %
Eosinophils Absolute: 0.3 10*3/uL (ref 0.0–0.5)
Eosinophils Relative: 4 %
Immature Granulocytes: 1 %
Lymphocytes Relative: 30 %
Lymphs Abs: 2.5 10*3/uL (ref 0.7–4.0)
Monocytes Absolute: 0.6 10*3/uL (ref 0.1–1.0)
Monocytes Relative: 8 %
Neutro Abs: 4.8 10*3/uL (ref 1.7–7.7)
Neutrophils Relative %: 56 %

## 2019-05-27 LAB — COMPREHENSIVE METABOLIC PANEL
ALT: 21 U/L (ref 0–44)
AST: 29 U/L (ref 15–41)
Albumin: 3.6 g/dL (ref 3.5–5.0)
Alkaline Phosphatase: 85 U/L (ref 38–126)
Anion gap: 11 (ref 5–15)
BUN: 17 mg/dL (ref 8–23)
CO2: 24 mmol/L (ref 22–32)
Calcium: 9.2 mg/dL (ref 8.9–10.3)
Chloride: 105 mmol/L (ref 98–111)
Creatinine, Ser: 1.03 mg/dL — ABNORMAL HIGH (ref 0.44–1.00)
GFR calc Af Amer: >60 mL/min (ref >60)
GFR calc non Af Amer: 59 mL/min — ABNORMAL LOW (ref >60)
Glucose, Bld: 109 mg/dL — ABNORMAL HIGH (ref 70–99)
Potassium: 3.8 mmol/L (ref 3.5–5.1)
Sodium: 140 mmol/L (ref 135–145)
Total Bilirubin: 0.4 mg/dL (ref 0.3–1.2)
Total Protein: 7 g/dL (ref 6.5–8.1)

## 2019-05-27 LAB — I-STAT CHEM 8, ED
BUN: 18 mg/dL (ref 8–23)
Calcium, Ion: 1.19 mmol/L (ref 1.15–1.40)
Chloride: 104 mmol/L (ref 98–111)
Creatinine, Ser: 1 mg/dL (ref 0.44–1.00)
Glucose, Bld: 106 mg/dL — ABNORMAL HIGH (ref 70–99)
HCT: 40 % (ref 36.0–46.0)
Hemoglobin: 13.6 g/dL (ref 12.0–15.0)
Potassium: 3.6 mmol/L (ref 3.5–5.1)
Sodium: 138 mmol/L (ref 135–145)
TCO2: 25 mmol/L (ref 22–32)

## 2019-05-27 LAB — CBC
HCT: 40.2 % (ref 36.0–46.0)
Hemoglobin: 13.3 g/dL (ref 12.0–15.0)
MCH: 31.1 pg (ref 26.0–34.0)
MCHC: 33.1 g/dL (ref 30.0–36.0)
MCV: 93.9 fL (ref 80.0–100.0)
Platelets: 288 10*3/uL (ref 150–400)
RBC: 4.28 MIL/uL (ref 3.87–5.11)
RDW: 12.3 % (ref 11.5–15.5)
WBC: 8.4 10*3/uL (ref 4.0–10.5)
nRBC: 0 % (ref 0.0–0.2)

## 2019-05-27 LAB — APTT: aPTT: 29 seconds (ref 24–36)

## 2019-05-27 MED ORDER — ASPIRIN 300 MG RE SUPP: 300.0000 mg | Freq: Every day | RECTAL | Status: DC

## 2019-05-27 MED ORDER — INSULIN ASPART 100 UNIT/ML ~~LOC~~ SOLN: 0.0000 [IU] | Freq: Every day | SUBCUTANEOUS | Status: DC

## 2019-05-27 MED ORDER — INSULIN ASPART 100 UNIT/ML ~~LOC~~ SOLN
0.0000 [IU] | Freq: Three times a day (TID) | SUBCUTANEOUS | Status: DC
  Administered 2019-05-29: 1 [IU] via SUBCUTANEOUS

## 2019-05-27 MED ORDER — KETOROLAC TROMETHAMINE 30 MG/ML IJ SOLN
15.0000 mg | Freq: Once | INTRAMUSCULAR | Status: AC
  Administered 2019-05-27: 13:00:00 15 mg via INTRAVENOUS
  Filled 2019-05-27: qty 1

## 2019-05-27 MED ORDER — ACETAMINOPHEN 325 MG PO TABS
650.0000 mg | ORAL_TABLET | ORAL | Status: DC | PRN
  Administered 2019-05-28 (×2): 650 mg via ORAL
  Filled 2019-05-27 (×2): qty 2

## 2019-05-27 MED ORDER — ASPIRIN 325 MG PO TABS: 325.0000 mg | ORAL_TABLET | Freq: Every day | ORAL | Status: DC

## 2019-05-27 MED ORDER — LORAZEPAM 2 MG/ML IJ SOLN
1.0000 mg | INTRAMUSCULAR | Status: DC | PRN
  Administered 2019-05-29: 1 mg via INTRAVENOUS
  Filled 2019-05-27 (×2): qty 1

## 2019-05-27 MED ORDER — METOCLOPRAMIDE HCL 5 MG/ML IJ SOLN
10.0000 mg | Freq: Once | INTRAMUSCULAR | Status: AC
  Administered 2019-05-27: 16:00:00 10 mg via INTRAVENOUS
  Filled 2019-05-27: qty 2

## 2019-05-27 MED ORDER — ATORVASTATIN CALCIUM 80 MG PO TABS: 80.0000 mg | ORAL_TABLET | Freq: Every day | ORAL | Status: DC

## 2019-05-27 MED ORDER — ACETAMINOPHEN 160 MG/5ML PO SOLN: 650.0000 mg | ORAL | Status: DC | PRN

## 2019-05-27 MED ORDER — DIPHENHYDRAMINE HCL 50 MG/ML IJ SOLN
25.0000 mg | Freq: Once | INTRAMUSCULAR | Status: AC
  Administered 2019-05-27: 16:00:00 25 mg via INTRAVENOUS
  Filled 2019-05-27: qty 1

## 2019-05-27 MED ORDER — LORAZEPAM 2 MG/ML IJ SOLN
1.0000 mg | Freq: Once | INTRAMUSCULAR | Status: AC
  Administered 2019-05-28: 02:00:00 1 mg via INTRAVENOUS
  Filled 2019-05-27: qty 1

## 2019-05-27 MED ORDER — STROKE: EARLY STAGES OF RECOVERY BOOK
Freq: Once | Status: AC
  Administered 2019-05-28: 1
  Filled 2019-05-27 (×2): qty 1

## 2019-05-27 MED ORDER — PROMETHAZINE HCL 25 MG/ML IJ SOLN
25.0000 mg | Freq: Once | INTRAMUSCULAR | Status: AC
  Administered 2019-05-27: 25 mg via INTRAVENOUS
  Filled 2019-05-27: qty 1

## 2019-05-27 MED ORDER — ACETAMINOPHEN 650 MG RE SUPP
650.0000 mg | RECTAL | Status: DC | PRN
  Administered 2019-05-28: 650 mg via RECTAL
  Filled 2019-05-27: qty 1

## 2019-05-27 MED ORDER — IOHEXOL 350 MG/ML SOLN
100.0000 mL | Freq: Once | INTRAVENOUS | Status: AC | PRN
  Administered 2019-05-27: 100 mL via INTRAVENOUS

## 2019-05-27 NOTE — Progress Notes (Signed)
Called by EDP after MRI. MRI technically limited.  No obvious acute stroke but unable to tolerate the MRI and a lot of motion. Can certainly miss a small brainstem infarct, that would correspond to her symptoms. Clinically, appears like a stroke according to Dr. Cheral Marker evaluation. Needs admission for stroke work-up.  Recommendations: -Admit to hospitalist -Telemetry monitoring -Allow for permissive hypertension for the first 24-48h - only treat PRN if SBP >220 mmHg. Blood pressures can be gradually normalized to SBP<140 upon discharge. -MRI brain without contrast -CT Angiogram of Head and neck -Echocardiogram -HgbA1c, fasting lipid panel -Frequent neuro checks -Prophylactic therapy-Antiplatelet med: Aspirin - dose 325mg  PO or 300mg  PR -Atorvastatin 80 mg PO daily -Risk factor modification -PT consult, OT consult, Speech consult Please page stroke NP/PA/MD (listed on AMION)  from 8am-4 pm as this patient will be followed by the stroke team at this point.  -- Amie Portland, MD [facility] Pager: [phone] If 7pm to 7am, please call on call as listed on AMION.

## 2019-05-27 NOTE — ED Notes (Signed)
ED Provider at bedside. 

## 2019-05-27 NOTE — ED Provider Notes (Signed)
[place] EMERGENCY DEPARTMENT Provider Note   CSN: [id number] Arrival date & time: 05/27/19  1004    History   Chief Complaint No chief complaint on file.   HPI Natalie Bond is a 63 y.o. female.     HPI Patient reports symptoms started suddenly on Thursday, 5 days ago.  She reports that she felt really dizzy and like she kept being off balance towards the right.  She also noted that there was a kind of a weakness in the right arm and leg.  Patient is well noticed the visual change.  She reports it seems something like blurred vision but sometimes a bit of a double vision on the right eye.  Ever since that first episode, she has had a moderate right sided headache.  Symptoms did improve somewhat.  Patient reports she did not come to the hospital at time of onset because she was afraid.  She reports now though the symptoms have persisted somewhat and she still has slight weakness in the right upper and lower extremities.  She reports she can walk and use them appropriately.  Denies she is ever had a similar episode. Past Medical History:  Diagnosis Date  . Arthritis    back, wrists  . Cancer ([place])    skin cancer  . Complication of anesthesia   . Diabetes mellitus without complication ([place])    pt recently started on metformin  . GERD (gastroesophageal reflux disease)   . Headache   . High cholesterol   . History of bronchitis   . History of kidney stones    2015  . Hypertension   . PONV (postoperative nausea and vomiting)   . Sleep apnea    pt. does not use a CPAP or BiPAP  . Stroke ([place])    Pt. states possible mini stroke in 2014, no deficits    Patient Active Problem List   Diagnosis Date Noted  . Arthritis of carpometacarpal [facility]) joint of right thumb 04/16/2019  . Mixed conductive and sensorineural hearing loss of left ear with restricted hearing of right ear 04/26/2018  . Vertigo 04/26/2018  . Venous stasis dermatitis of left lower extremity  04/09/2018  . Achilles tendon contracture, left 04/09/2018  . Pain in left ankle and joints of left foot 04/09/2018  . Primary osteoarthritis of first carpometacarpal joint of right hand 10/09/2017  . Bilateral hand pain 10/05/2017  . Primary osteoarthritis of both first carpometacarpal joints 10/05/2017  . Trochanteric bursitis, right hip 06/29/2017  . Degenerative disc disease at L5-S1 level 05/04/2017  . History of joint replacement 05/04/2017  . Claw toe, acquired, right 04/27/2017  . Achilles tendon contracture, right 04/27/2017  . Pain in right foot 04/11/2017  . Status post left hip replacement 10/21/2016  . Unilateral primary osteoarthritis, left hip 09/23/2016    Class: Chronic  . Chronic back pain 01/14/2016  . Migraine 01/14/2016  . Essential hypertension 01/22/2015    Past Surgical History:  Procedure Laterality Date  . ABDOMINAL HYSTERECTOMY    . BACK SURGERY    . CARDIAC CATHETERIZATION     01/27/15 [facility]): EF 60%, normal coronaries.  Mortimer Fries SUSPENSION PLASTY Right 12/12/2018   Procedure: RIGHT THUMB CARPOMETACARPAL ([place]) ARTHROPLASTY;  Surgeon: Leandrew Koyanagi, MD;  Location: [place];  Service: Orthopedics;  Laterality: Right;  . CATARACT EXTRACTION, BILATERAL    . CHOLECYSTECTOMY  07/2016   done [place]  . EYE SURGERY    . HERNIA REPAIR  Left 1990   left groin   . LOWER EXTREMITY ANGIOGRAM Left 06/08/2015   Procedure: Ascending Venogram  Iliac Vein ;  Surgeon: Elam Dutch, MD;  Location: [facility] OR;  Service: Vascular;  Laterality: Left;  ;  TARA- [facility] / ZACH-[place] HAVE BEEN NOTIFIED/ CONFIRMED  . PARTIAL HYSTERECTOMY    . SHOULDER SURGERY    . TOTAL HIP ARTHROPLASTY Left 10/21/2016   Procedure: LEFT TOTAL HIP ARTHROPLASTY ANTERIOR APPROACH;  Surgeon: Mcarthur Rossetti, MD;  [facility];  Service: Orthopedics;  Laterality: Left;  . WRIST SURGERY Bilateral    carpal tunnel     OB History   No obstetric  history on file.      Home Medications    Prior to Admission medications   Medication Sig Start Date End Date Taking? Authorizing Provider  amitriptyline (ELAVIL) 25 MG tablet Take 1 tablet (25 mg total) by mouth at bedtime. 02/01/19   Leandrew Koyanagi, MD  amitriptyline (ELAVIL) 25 MG tablet Take 1 tablet (25 mg total) by mouth at bedtime. 03/14/19   Leandrew Koyanagi, MD  aspirin 81 MG chewable tablet Chew 1 tablet (81 mg total) by mouth 2 (two) times daily. 10/23/16   Mcarthur Rossetti, MD  baclofen (LIORESAL) 10 MG tablet Take 1 tablet (10 mg total) by mouth every 8 (eight) hours as needed for muscle spasms (Pain). 01/24/18   Magnus Sinning, MD  cloNIDine (CATAPRES) 0.1 MG tablet Take 0.1 mg by mouth 2 (two) times daily as needed (Blood pressure is over 160). If BP is over 160    [provider]  diclofenac sodium (VOLTAREN) 1 % GEL Apply 2 g 4 (four) times daily topically. 10/05/17   Mcarthur Rossetti, MD  gabapentin (NEURONTIN) 100 MG capsule Take 1 capsule (100 mg total) by mouth at bedtime. 05/04/17   Jessy Oto, MD  HYDROcodone-acetaminophen (NORCO/VICODIN) 5-325 MG tablet Take 1-2 tablets by mouth every 6 (six) hours as needed for moderate pain. 11/03/16   Mcarthur Rossetti, MD  ibuprofen (ADVIL,MOTRIN) 800 MG tablet Take 1 tablet (800 mg total) by mouth every 8 (eight) hours as needed. 01/09/19   Aundra Dubin, PA-C  magnesium 30 MG tablet Take 30 mg by mouth 2 (two) times daily.    [provider]  meloxicam (MOBIC) 7.5 MG tablet Take 1 tablet (7.5 mg total) by mouth daily. 04/11/17   Newt Minion, MD  metFORMIN (GLUCOPHAGE) 500 MG tablet Take by mouth 2 (two) times daily with a meal.    [provider]  metoprolol tartrate (LOPRESSOR) 50 MG tablet Take by mouth.    [provider]  omeprazole (PRILOSEC) 20 MG capsule Take 20 mg by mouth daily.    [provider]  ondansetron (ZOFRAN) 4 MG tablet Take 1-2 tablets (4-8 mg  total) by mouth every 8 (eight) hours as needed for nausea or vomiting. 12/12/18   Leandrew Koyanagi, MD  ondansetron (ZOFRAN) 4 MG tablet Take 1-2 tablets (4-8 mg total) by mouth every 8 (eight) hours as needed for nausea or vomiting. 12/12/18   Leandrew Koyanagi, MD  Potassium 75 MG TABS Take by mouth daily.    [provider]  topiramate (TOPAMAX) 25 MG tablet Take 25 mg by mouth at bedtime. 08/08/16   [provider]  traMADol (ULTRAM) 50 MG tablet Take 1 tablet (50 mg total) by mouth 3 (three) times daily as needed. 12/27/18   Leandrew Koyanagi, MD  traMADol (ULTRAM) 50 MG tablet Take 1 tablet (50 mg total) by mouth every 6 (six) hours as needed. 01/09/19   Aundra Dubin, PA-C    Family History Family History  Problem Relation Age of Onset  . Cancer Mother   . Diabetes Mother   . Heart disease Mother   . Hyperlipidemia Mother   . Hypertension Mother   . Cancer Father   . Diabetes Father   . Heart disease Father   . Hyperlipidemia Father   . Hypertension Father   . Varicose Veins Father   . Cancer Sister   . Heart disease Sister   . Hyperlipidemia Sister   . Cancer Brother   . Diabetes Brother   . Hyperlipidemia Brother     Social History Social History   Tobacco Use  . Smoking status: Never Smoker  . Smokeless tobacco: Never Used  Substance Use Topics  . Alcohol use: No  . Drug use: No     Allergies   Dilaudid [hydromorphone hcl] and Penicillins   Review of Systems Review of Systems 10 Systems reviewed and are negative for acute change except as noted in the HPI.   Physical Exam Updated Vital Signs BP (!) 149/113   Pulse 82   Temp 98.4 F (36.9 C) (Oral)   Resp 18   Ht 5\' 7"  (1.702 m)   Wt 81.6 kg   SpO2 91%   BMI 28.19 kg/m   Physical Exam Constitutional:      Appearance: Normal appearance.  HENT:     Head: Normocephalic and atraumatic.     Nose: Nose normal.  Eyes:     Pupils: Pupils are equal, round, and reactive to light.      Comments: With leftward gaze, the appears to be a bit of delay in the right eye to returning midline.  She does perceive that her vertiginous symptoms worsen and that there is a temporary double vision.  Is are symmetric and responsive.  Neck:     Musculoskeletal: Neck supple.  Cardiovascular:     Rate and Rhythm: Normal rate and regular rhythm.  Pulmonary:     Effort: Pulmonary effort is normal.     Breath sounds: Normal breath sounds.  Abdominal:     General: There is no distension.     Palpations: Abdomen is soft.     Tenderness: There is no abdominal tenderness. There is no guarding.  Musculoskeletal: Normal range of motion.        General: No swelling or tenderness.     Right lower leg: No edema.     Left lower leg: No edema.  Skin:    General: Skin is warm and dry.  Neurological:     Mental Status: She is alert.     Comments: Except for ocular motion as outlined cranial nerves appear symmetric.  Patient has a 3\5 grip on the right as opposed to 5\5 on the left.  He\5 motor strength for dorsiflexion and resisting downward pressure on the right lower extremity as opposed to the left.  Patient endorses slightly decreased sensation to light touch right upper and lower extremity as compared to the left.  Psychiatric:        Mood and Affect: Mood normal.       ED Treatments / Results  Labs (all labs ordered are listed, but only abnormal results are displayed) Labs Reviewed  COMPREHENSIVE METABOLIC PANEL - Abnormal; Notable for the following components:      Result Value  Glucose, Bld 109 (*)    Creatinine, Ser 1.03 (*)    GFR calc non Af Amer 59 (*)    All other components within normal limits  I-STAT CHEM 8, ED - Abnormal; Notable for the following components:   Glucose, Bld 106 (*)    All other components within normal limits  NOVEL CORONAVIRUS, NAA (HOSPITAL ORDER, SEND-OUT TO REF LAB)  PROTIME-INR  APTT  CBC  DIFFERENTIAL    EKG EKG Interpretation  Date/Time:   Monday May 27 2019 10:15:10 EDT Ventricular Rate:  95 PR Interval:    QRS Duration: 91 QT Interval:  356 QTC Calculation: 448 R Axis:   -38 Text Interpretation:  Sinus rhythm Left axis deviation agree, no acute ischemic changes, no sig change from previous except increased rate. Confirmed by Charlesetta Shanks [phone]) on 05/27/2019 2:06:22 PM   Radiology Ct Head Wo Contrast  Result Date: 05/27/2019 CLINICAL DATA:  Possible stroke. Right-sided headache with blurred vision. EXAM: CT HEAD WITHOUT CONTRAST TECHNIQUE: Contiguous axial images were obtained from the base of the skull through the vertex without intravenous contrast. COMPARISON:  01/01/2014 FINDINGS: Brain: No evidence of acute infarction, hemorrhage, hydrocephalus, extra-axial collection or mass lesion/mass effect. Vascular: No hyperdense vessel or unexpected calcification. Skull: Normal. Negative for fracture or focal lesion. Sinuses/Orbits: No acute finding.  Bilateral cataract resection IMPRESSION: Negative head CT Electronically Signed   By: Monte Fantasia M.D.   On: 05/27/2019 11:08    Procedures Procedures (including critical care time)  Medications Ordered in ED Medications  promethazine (PHENERGAN) injection 25 mg (has no administration in time range)  ketorolac (TORADOL) 30 MG/ML injection 15 mg (15 mg Intravenous Given 05/27/19 1305)     Initial Impression / Assessment and Plan / ED Course  I have reviewed the triage vital signs and the nursing notes.  Pertinent labs & imaging results that were available during my care of the patient were reviewed by me and considered in my medical decision making (see chart for details).       Consult: Dr. Cheral Marker of neurology consulted.  He has ordered MRI MRA.  Final disposition plan pending results.  Patient presents with onset of neurologic symptoms 5 days earlier.  On exam she does have a objective motor weakness on the right and more subtle ocular movement anomaly.  His  mental status is clear.  Cognitive function intact.  She is in no acute distress.  Disposition pending further neurologic work-up.  Final Clinical Impressions(s) / ED Diagnoses   Final diagnoses:  Right sided weakness  Vertigo    ED Discharge Orders    None       Charlesetta Shanks, MD 05/27/19 1408

## 2019-05-27 NOTE — H&P (Signed)
TRH H&P    Patient Demographics:    Natalie Bond, is a 63 y.o. female  MRN: [id number]  DOB - [birth year]  Admit Date - 05/27/2019  Referring MD/NP/PA:  Lennice Sites  Outpatient Primary MD for the patient is Leanna Battles, MD  Patient coming from:  home  Chief complaint-  Right arm weakness   HPI:    Natalie Bond  is a 63 y.o. female,  w hypertension, hyperlipidemia, dm2, OSA , gerd, h/o TIA , presents with c/o right upper extremity weakness starting on Thursday while washing a fish tank.  Pt noted numbness, vertigo, unsteady gait, and leaning to the right.  Pt states that she required her husband to ambulate and that she was improving over the weekend but then her symptoms worsened today and therefore she presented to ED for evaluation.  Pt also notes slight right sided facial numbness.  Pt notes headache on Tuesday or Wednesday.   Pt denies cp, palp, sob, n/v, abd pain, diarrhea, brbpr, black stool, dysuria.   In ED T 98.4, P 98  R 18, Bp 177/94  Pox 91-100% Wt 81.6 kg  CT brain IMPRESSION: Negative head CT  MRI brain/ MRA brain IMPRESSION: MRI HEAD IMPRESSION:  1. Technically limited examination due to as motion artifact. Inability to tolerate the full length of the study as well 2. No acute intracranial infarct or other abnormality. 3. Mild chronic microvascular ischemic disease for age.  MRA HEAD IMPRESSION:  1. Technically limited exam due to motion artifact. 2. Negative intracranial MRA for large vessel occlusion. No definite hemodynamically significant or correctable stenosis.  Wbc 8.4, Hgb 13.6, Plt 288 Na 138, K 3.6 Bun 18, Creatinine 1.00 INR 1.0 Glucose 106  Pt will be admitted for w/up of right arm weakness / stroke    Review of systems:    In addition to the HPI above,  No Fever-chills, No Headache, No changes with Vision or hearing, No problems swallowing food  or Liquids, No Chest pain, Cough or Shortness of Breath, No Abdominal pain, No Nausea or Vomiting, bowel movements are regular, No Blood in stool or Urine, No dysuria, No new skin rashes or bruises, No new joints pains-aches,   No recent weight gain or loss, No polyuria, polydypsia or polyphagia, No significant Mental Stressors.  All other systems reviewed and are negative.    Past History of the following :    Past Medical History:  Diagnosis Date  . Arthritis    back, wrists  . Cancer ([place])    skin cancer  . Complication of anesthesia   . Diabetes mellitus without complication ([place])    pt recently started on metformin  . GERD (gastroesophageal reflux disease)   . Headache   . High cholesterol   . History of bronchitis   . History of kidney stones    2015  . Hypertension   . PONV (postoperative nausea and vomiting)   . Sleep apnea    pt. does not use a CPAP or BiPAP  . Stroke [facility])  Pt. states possible mini stroke in 2014, no deficits      Past Surgical History:  Procedure Laterality Date  . ABDOMINAL HYSTERECTOMY    . BACK SURGERY    . CARDIAC CATHETERIZATION     01/27/15 [facility]): EF 60%, normal coronaries.  Mortimer Fries SUSPENSION PLASTY Right 12/12/2018   Procedure: RIGHT THUMB CARPOMETACARPAL ([place]) ARTHROPLASTY;  Surgeon: Leandrew Koyanagi, MD;  Location: [place];  Service: Orthopedics;  Laterality: Right;  . CATARACT EXTRACTION, BILATERAL    . CHOLECYSTECTOMY  07/2016   done [place]  . EYE SURGERY    . HERNIA REPAIR Left 1990   left groin   . LOWER EXTREMITY ANGIOGRAM Left 06/08/2015   Procedure: Ascending Venogram  Iliac Vein ;  Surgeon: Elam Dutch, MD;  Location[facility];  Service: Vascular;  Laterality: Left;  ;  [facility]- [place] [employer] / [place] HAVE BEEN NOTIFIED/ CONFIRMED  . PARTIAL HYSTERECTOMY    . SHOULDER SURGERY    . TOTAL HIP ARTHROPLASTY Left 10/21/2016   Procedure: LEFT TOTAL HIP ARTHROPLASTY ANTERIOR  APPROACH;  Surgeon: Mcarthur Rossetti, MD;  Location: [facility] ORS;  Service: Orthopedics;  Laterality: Left;  . WRIST SURGERY Bilateral    carpal tunnel      Social History:      Social History   Tobacco Use  . Smoking status: Never Smoker  . Smokeless tobacco: Never Used  Substance Use Topics  . Alcohol use: No       Family History :     Family History  Problem Relation Age of Onset  . Cancer Mother   . Diabetes Mother   . Heart disease Mother   . Hyperlipidemia Mother   . Hypertension Mother   . Cancer Father   . Diabetes Father   . Heart disease Father   . Hyperlipidemia Father   . Hypertension Father   . Varicose Veins Father   . Cancer Sister   . Heart disease Sister   . Hyperlipidemia Sister   . Cancer Brother   . Diabetes Brother   . Hyperlipidemia Brother        Home Medications:   Prior to Admission medications   Medication Sig Start Date End Date Taking? Authorizing Provider  amitriptyline (ELAVIL) 25 MG tablet Take 1 tablet (25 mg total) by mouth at bedtime. 02/01/19   Leandrew Koyanagi, MD  amitriptyline (ELAVIL) 25 MG tablet Take 1 tablet (25 mg total) by mouth at bedtime. 03/14/19   Leandrew Koyanagi, MD  aspirin 81 MG chewable tablet Chew 1 tablet (81 mg total) by mouth 2 (two) times daily. 10/23/16   Mcarthur Rossetti, MD  baclofen (LIORESAL) 10 MG tablet Take 1 tablet (10 mg total) by mouth every 8 (eight) hours as needed for muscle spasms (Pain). 01/24/18   Magnus Sinning, MD  cloNIDine (CATAPRES) 0.1 MG tablet Take 0.1 mg by mouth 2 (two) times daily as needed (Blood pressure is over 160). If BP is over 160    [provider]  diclofenac sodium (VOLTAREN) 1 % GEL Apply 2 g 4 (four) times daily topically. 10/05/17   Mcarthur Rossetti, MD  gabapentin (NEURONTIN) 100 MG capsule Take 1 capsule (100 mg total) by mouth at bedtime. 05/04/17   Jessy Oto, MD  HYDROcodone-acetaminophen (NORCO/VICODIN) 5-325 MG tablet Take 1-2 tablets by  mouth every 6 (six) hours as needed for moderate pain. 11/03/16   Mcarthur Rossetti, MD  ibuprofen (ADVIL,MOTRIN) 800 MG tablet  Take 1 tablet (800 mg total) by mouth every 8 (eight) hours as needed. 01/09/19   Aundra Dubin, PA-C  magnesium 30 MG tablet Take 30 mg by mouth 2 (two) times daily.    [provider]  meloxicam (MOBIC) 7.5 MG tablet Take 1 tablet (7.5 mg total) by mouth daily. 04/11/17   Newt Minion, MD  metFORMIN (GLUCOPHAGE) 500 MG tablet Take by mouth 2 (two) times daily with a meal.    [provider]  metoprolol tartrate (LOPRESSOR) 50 MG tablet Take by mouth.    [provider]  omeprazole (PRILOSEC) 20 MG capsule Take 20 mg by mouth daily.    [provider]  ondansetron (ZOFRAN) 4 MG tablet Take 1-2 tablets (4-8 mg total) by mouth every 8 (eight) hours as needed for nausea or vomiting. 12/12/18   Leandrew Koyanagi, MD  ondansetron (ZOFRAN) 4 MG tablet Take 1-2 tablets (4-8 mg total) by mouth every 8 (eight) hours as needed for nausea or vomiting. 12/12/18   Leandrew Koyanagi, MD  Potassium 75 MG TABS Take by mouth daily.    [provider]  topiramate (TOPAMAX) 25 MG tablet Take 25 mg by mouth at bedtime. 08/08/16   [provider]  traMADol (ULTRAM) 50 MG tablet Take 1 tablet (50 mg total) by mouth 3 (three) times daily as needed. 12/27/18   Leandrew Koyanagi, MD  traMADol (ULTRAM) 50 MG tablet Take 1 tablet (50 mg total) by mouth every 6 (six) hours as needed. 01/09/19   Aundra Dubin, PA-C     Allergies:     Allergies  Allergen Reactions  . Dilaudid [Hydromorphone Hcl] Anaphylaxis    [name]   . Penicillins Anaphylaxis and Rash    Has patient had a PCN reaction causing immediate rash, facial/tongue/throat swelling, SOB or lightheadedness with hypotension: Yes Has patient had a PCN reaction causing severe rash involving mucus membranes or skin necrosis: No Has patient had a PCN reaction that  required hospitalization Yes Has patient had a PCN reaction occurring within the last 10 years: No If all of the above answers are "NO", then may proceed with Cephalosporin use.      Physical Exam:   Vitals  Blood pressure 128/87, pulse 98, temperature 98.4 F (36.9 C), temperature source Oral, resp. rate 18, height 5\' 7"  (1.702 m), weight 81.6 kg, SpO2 96 %.  1.  General: axox3  2. Psychiatric: euthymic  3. Neurologic: cn2-12 intact, reflexes 2+ symmetric, diffuse with no clonus, motor 5/5 in the left upper/ lower ext, and 5-/5 right upper extremity, and 5/5 in the right lower ext.   4. HEENMT:  Anicteric, pupils 1.51mm symmetric, direct, consensual, near intact Neck: no jvd, no bruit  5. Respiratory : CTAB  6. Cardiovascular : rrr s1, s2, no m/g/r  7. Gastrointestinal:  Abd: soft, obese, nt, nd, +bs  8. Skin:  Ext: no c/c/e,  No rash  9.Musculoskeletal:  Good ROM  No adenopathy    Data Review:    CBC Recent Labs  Lab 05/27/19 1040 05/27/19 1052  WBC 8.4  --   HGB 13.3 13.6  HCT 40.2 40.0  PLT 288  --   MCV 93.9  --   MCH 31.1  --   MCHC 33.1  --   RDW 12.3  --   LYMPHSABS 2.5  --   MONOABS 0.6  --   EOSABS 0.3  --   BASOSABS 0.1  --    ------------------------------------------------------------------------------------------------------------------  Results for orders placed or performed during the hospital encounter of 05/27/19 (from the past 48 hour(s))  Protime-INR     Status: None   Collection Time: 05/27/19 10:40 AM  Result Value Ref Range   Prothrombin Time 13.1 11.4 - 15.2 seconds   INR 1.0 0.8 - 1.2    Comment: (NOTE) INR goal varies based on device and disease states. Performed at [facility], [place] [address], [place], [place] [address]   APTT     Status: None   Collection Time: 05/27/19 10:40 AM  Result Value Ref Range   aPTT 29 24 - 36 seconds    Comment: Performed at [facility] [address],  [place], [place] [address]  CBC     Status: None   Collection Time: 05/27/19 10:40 AM  Result Value Ref Range   WBC 8.4 4.0 - 10.5 K/uL   RBC 4.28 3.87 - 5.11 MIL/uL   Hemoglobin 13.3 12.0 - 15.0 g/dL   HCT 40.2 36.0 - 46.0 %   MCV 93.9 80.0 - 100.0 fL   MCH 31.1 26.0 - 34.0 pg   MCHC 33.1 30.0 - 36.0 g/dL   RDW 12.3 11.5 - 15.5 %   Platelets 288 150 - 400 K/uL   nRBC 0.0 0.0 - 0.2 %    Comment: Performed at [facility], [place] [address], [place], [place] [address]  Differential     Status: None   Collection Time: 05/27/19 10:40 AM  Result Value Ref Range   Neutrophils Relative % 56 %   Neutro Abs 4.8 1.7 - 7.7 K/uL   Lymphocytes Relative 30 %   Lymphs Abs 2.5 0.7 - 4.0 K/uL   Monocytes Relative 8 %   Monocytes Absolute 0.6 0.1 - 1.0 K/uL   Eosinophils Relative 4 %   Eosinophils Absolute 0.3 0.0 - 0.5 K/uL   Basophils Relative 1 %   Basophils Absolute 0.1 0.0 - 0.1 K/uL   Immature Granulocytes 1 %   Abs Immature Granulocytes 0.04 0.00 - 0.07 K/uL    Comment: Performed at [facility] [address], [place], [place] [address]  Comprehensive metabolic panel     Status: Abnormal   Collection Time: 05/27/19 10:40 AM  Result Value Ref Range   Sodium 140 135 - 145 mmol/L   Potassium 3.8 3.5 - 5.1 mmol/L   Chloride 105 98 - 111 mmol/L   CO2 24 22 - 32 mmol/L   Glucose, Bld 109 (H) 70 - 99 mg/dL   BUN 17 8 - 23 mg/dL   Creatinine, Ser 1.03 (H) 0.44 - 1.00 mg/dL   Calcium 9.2 8.9 - 10.3 mg/dL   Total Protein 7.0 6.5 - 8.1 g/dL   Albumin 3.6 3.5 - 5.0 g/dL   AST 29 15 - 41 U/L   ALT 21 0 - 44 U/L   Alkaline Phosphatase 85 38 - 126 U/L   Total Bilirubin 0.4 0.3 - 1.2 mg/dL   GFR calc non Af Amer 59 (L) >60 mL/min   GFR calc Af Amer >60 >60 mL/min   Anion gap 11 5 - 15    Comment: Performed at [facility] [address], [place],  [address]  I-stat chem 8, ED     Status: Abnormal   Collection Time: 05/27/19 10:52 AM  Result Value Ref Range   Sodium 138  135 - 145 mmol/L   Potassium 3.6 3.5 - 5.1 mmol/L  Chloride 104 98 - 111 mmol/L   BUN 18 8 - 23 mg/dL   Creatinine, Ser 1.00 0.44 - 1.00 mg/dL   Glucose, Bld 106 (H) 70 - 99 mg/dL   Calcium, Ion 1.19 1.15 - 1.40 mmol/L   TCO2 25 22 - 32 mmol/L   Hemoglobin 13.6 12.0 - 15.0 g/dL   HCT 40.0 36.0 - 46.0 %    Chemistries  Recent Labs  Lab 05/27/19 1040 05/27/19 1052  NA 140 138  K 3.8 3.6  CL 105 104  CO2 24  --   GLUCOSE 109* 106*  BUN 17 18  CREATININE 1.03* 1.00  CALCIUM 9.2  --   AST 29  --   ALT 21  --   ALKPHOS 85  --   BILITOT 0.4  --    ------------------------------------------------------------------------------------------------------------------  ------------------------------------------------------------------------------------------------------------------ GFR: Estimated Creatinine Clearance: 64.9 mL/min (by C-G formula based on SCr of 1 mg/dL). Liver Function Tests: Recent Labs  Lab 05/27/19 1040  AST 29  ALT 21  ALKPHOS 85  BILITOT 0.4  PROT 7.0  ALBUMIN 3.6   No results for input(s): LIPASE, AMYLASE in the last 168 hours. No results for input(s): AMMONIA in the last 168 hours. Coagulation Profile: Recent Labs  Lab 05/27/19 1040  INR 1.0   Cardiac Enzymes: No results for input(s): CKTOTAL, CKMB, CKMBINDEX, TROPONINI in the last 168 hours. BNP (last 3 results) No results for input(s): PROBNP in the last 8760 hours. HbA1C: No results for input(s): HGBA1C in the last 72 hours. CBG: No results for input(s): GLUCAP in the last 168 hours. Lipid Profile: No results for input(s): CHOL, HDL, LDLCALC, TRIG, CHOLHDL, LDLDIRECT in the last 72 hours. Thyroid Function Tests: No results for input(s): TSH, T4TOTAL, FREET4, T3FREE, THYROIDAB in the last 72 hours. Anemia Panel: No results for input(s): VITAMINB12, FOLATE, FERRITIN, TIBC, IRON, RETICCTPCT in the last 72 hours.   --------------------------------------------------------------------------------------------------------------- Urine analysis: No results found for: COLORURINE, APPEARANCEUR, LABSPEC, PHURINE, GLUCOSEU, HGBUR, BILIRUBINUR, KETONESUR, PROTEINUR, UROBILINOGEN, NITRITE, LEUKOCYTESUR    Imaging Results:    Ct Head Wo Contrast  Result Date: 05/27/2019 CLINICAL DATA:  Possible stroke. Right-sided headache with blurred vision. EXAM: CT HEAD WITHOUT CONTRAST TECHNIQUE: Contiguous axial images were obtained from the base of the skull through the vertex without intravenous contrast. COMPARISON:  01/01/2014 FINDINGS: Brain: No evidence of acute infarction, hemorrhage, hydrocephalus, extra-axial collection or mass lesion/mass effect. Vascular: No hyperdense vessel or unexpected calcification. Skull: Normal. Negative for fracture or focal lesion. Sinuses/Orbits: No acute finding.  Bilateral cataract resection IMPRESSION: Negative head CT Electronically Signed   By: Monte Fantasia M.D.   On: 05/27/2019 11:08   Mr Angio Head Wo Contrast  Result Date: 05/27/2019 CLINICAL DATA:  Initial evaluation for acute right-sided numbness and weakness, vertigo, unsteady. EXAM: MRI HEAD WITHOUT CONTRAST MRA HEAD WITHOUT CONTRAST TECHNIQUE: Multiplanar, multiecho pulse sequences of the brain and surrounding structures were obtained without intravenous contrast. Angiographic images of the head were obtained using MRA technique without contrast. COMPARISON:  Prior CT from earlier the same day. FINDINGS: MRI HEAD FINDINGS Brain: Examination technically limited as the patient was unable to tolerate the full length of the exam. Axial coronal DWI sequence, axial T2 and FLAIR sequences, and axial SWI sequence only were performed. Additionally, images are moderately degraded by motion artifact. Cerebral volume within normal limits for age. Mild scattered subcentimeter T2 signal abnormality noted within the periventricular and deep white  matter both cerebral hemispheres, most likely related to chronic microvascular ischemic  changes, felt to be within normal limits for age. No abnormal foci of restricted diffusion to suggest acute or subacute ischemia. Gray-white matter differentiation maintained. No encephalomalacia to suggest chronic cortical infarction. No foci of susceptibility artifact to suggest acute or chronic intracranial hemorrhage. No mass lesion, midline shift or mass effect. No hydrocephalus. No extra-axial fluid collection. Vascular: Major intracranial vascular flow voids are maintained. Skull and upper cervical spine: Craniocervical junction grossly within normal limits on this limited exam. No focal marrow replacing lesion. Scalp soft tissues unremarkable. Sinuses/Orbits: Globes and orbital soft tissues demonstrate no acute finding. Patient status post bilateral ocular lens replacement. Paranasal sinuses are clear. Small bilateral mastoid effusions, left greater than right, of doubtful significance. Inner ear structures grossly normal. Other: None. MRA HEAD FINDINGS ANTERIOR CIRCULATION: Examination technically degraded by motion artifact. Distal cervical segments of the internal carotid arteries are patent with symmetric antegrade flow. Petrous segments widely patent. Scattered atheromatous irregularity throughout the cavernous/supraclinoid ICAs without definite hemodynamically significant stenosis. ICA termini well perfused. A1 segments widely patent. Normal anterior communicating artery complex. Anterior cerebral arteries patent to their distal aspects without high-grade stenosis. No M1 stenosis or occlusion. Normal MCA bifurcations. Distal MCA branches well perfused and symmetric. POSTERIOR CIRCULATION: Dominant right vertebral artery patent to the vertebrobasilar junction without stenosis. Left vertebral artery diminutive with scattered atheromatous irregularity without definite high-grade stenosis. Left PICA patent proximally.  Right PICA not well seen. Basilar patent to its distal aspect without flow-limiting stenosis. Superior cerebral arteries patent proximally. Both of the posterior cerebral arteries appear to be primarily supplied via the basilar, although a tiny bilateral posterior communicating arteries noted. PCAs patent to their distal aspects without high-grade stenosis. No appreciable aneurysm identified on this motion degraded exam. IMPRESSION: MRI HEAD IMPRESSION: 1. Technically limited examination due to as motion artifact. Inability to tolerate the full length of the study as well 2. No acute intracranial infarct or other abnormality. 3. Mild chronic microvascular ischemic disease for age. MRA HEAD IMPRESSION: 1. Technically limited exam due to motion artifact. 2. Negative intracranial MRA for large vessel occlusion. No definite hemodynamically significant or correctable stenosis. Electronically Signed   By: Jeannine Boga M.D.   On: 05/27/2019 21:12   Mr Brain Wo Contrast  Result Date: 05/27/2019 CLINICAL DATA:  Initial evaluation for acute right-sided numbness and weakness, vertigo, unsteady. EXAM: MRI HEAD WITHOUT CONTRAST MRA HEAD WITHOUT CONTRAST TECHNIQUE: Multiplanar, multiecho pulse sequences of the brain and surrounding structures were obtained without intravenous contrast. Angiographic images of the head were obtained using MRA technique without contrast. COMPARISON:  Prior CT from earlier the same day. FINDINGS: MRI HEAD FINDINGS Brain: Examination technically limited as the patient was unable to tolerate the full length of the exam. Axial coronal DWI sequence, axial T2 and FLAIR sequences, and axial SWI sequence only were performed. Additionally, images are moderately degraded by motion artifact. Cerebral volume within normal limits for age. Mild scattered subcentimeter T2 signal abnormality noted within the periventricular and deep white matter both cerebral hemispheres, most likely related to chronic  microvascular ischemic changes, felt to be within normal limits for age. No abnormal foci of restricted diffusion to suggest acute or subacute ischemia. Gray-white matter differentiation maintained. No encephalomalacia to suggest chronic cortical infarction. No foci of susceptibility artifact to suggest acute or chronic intracranial hemorrhage. No mass lesion, midline shift or mass effect. No hydrocephalus. No extra-axial fluid collection. Vascular: Major intracranial vascular flow voids are maintained. Skull and upper cervical spine: Craniocervical junction grossly within normal  limits on this limited exam. No focal marrow replacing lesion. Scalp soft tissues unremarkable. Sinuses/Orbits: Globes and orbital soft tissues demonstrate no acute finding. Patient status post bilateral ocular lens replacement. Paranasal sinuses are clear. Small bilateral mastoid effusions, left greater than right, of doubtful significance. Inner ear structures grossly normal. Other: None. MRA HEAD FINDINGS ANTERIOR CIRCULATION: Examination technically degraded by motion artifact. Distal cervical segments of the internal carotid arteries are patent with symmetric antegrade flow. Petrous segments widely patent. Scattered atheromatous irregularity throughout the cavernous/supraclinoid ICAs without definite hemodynamically significant stenosis. ICA termini well perfused. A1 segments widely patent. Normal anterior communicating artery complex. Anterior cerebral arteries patent to their distal aspects without high-grade stenosis. No M1 stenosis or occlusion. Normal MCA bifurcations. Distal MCA branches well perfused and symmetric. POSTERIOR CIRCULATION: Dominant right vertebral artery patent to the vertebrobasilar junction without stenosis. Left vertebral artery diminutive with scattered atheromatous irregularity without definite high-grade stenosis. Left PICA patent proximally. Right PICA not well seen. Basilar patent to its distal aspect  without flow-limiting stenosis. Superior cerebral arteries patent proximally. Both of the posterior cerebral arteries appear to be primarily supplied via the basilar, although a tiny bilateral posterior communicating arteries noted. PCAs patent to their distal aspects without high-grade stenosis. No appreciable aneurysm identified on this motion degraded exam. IMPRESSION: MRI HEAD IMPRESSION: 1. Technically limited examination due to as motion artifact. Inability to tolerate the full length of the study as well 2. No acute intracranial infarct or other abnormality. 3. Mild chronic microvascular ischemic disease for age. MRA HEAD IMPRESSION: 1. Technically limited exam due to motion artifact. 2. Negative intracranial MRA for large vessel occlusion. No definite hemodynamically significant or correctable stenosis. Electronically Signed   By: Jeannine Boga M.D.   On: 05/27/2019 21:12   nsr at 95, nl axis, nl int, poor R progression, no st-t changes c/w ischemia    Assessment & Plan:    Principal Problem:   Stroke (cerebrum) (HCC) Active Problems:   Essential hypertension   Right arm weakness   Type 2 diabetes mellitus without complication ([place])  Presumed stroke ? Brain stem, though not evident on MRI brain CTA brain/ neck Check cardiac echo Check hga1c, lipid Permissive hypertension Cont aspirin Start Lipitor 80mg  po qhs Neurology consulted by ED, appreciate input  H/o migraines Cont Topiramate 25mg  po qhs  DM2 Cont Metformin 500mg  po bid fsbs ac and qhs, ISS  Diabetic neuropathy Cont gabapentin 100mg  po qhs  Hypertension Cont metoprolol 50mg  po bid Cont Clonidine 0.1mg  po bid  Chronic back pain Hold off on Meloxicam Cont Flexeril prn  Cont Norco prn   Gerd Cont PPI  OSA   DVT Prophylaxis-   SCDs  AM Labs Ordered, also please review Full Orders  Family Communication: Admission, patients condition and plan of care including tests being ordered have been discussed  with the patient  who indicate understanding and agree with the plan and Code Status.  Code Status:  FULL CODE,  D/w husband  Admission status: Observervation  : Based on patients clinical presentation and evaluation of above clinical data, I have made determination that patient meets observation criteria at this time  Time spent in minutes : 70   Jani Gravel M.D on 05/27/2019 at 10:56 PM

## 2019-05-27 NOTE — ED Provider Notes (Signed)
Patient awaiting MRI to rule out stroke.  CT scan normal.  Patient has had right-sided weakness for the last several days.  Will need admission for stroke care if MRI is positive.  Neurology is aware of the patient.  MRI did not show a stroke.  However, Dr. Malen Gauze states that symptoms are consistent with likely brainstem stroke.  MRI did have some motion artifact.  Patient to be admitted for stroke work-up.  Still has some right lower leg numbness.  Admitted to medicine for further stroke care. Suspect stroke despite MRI.   This chart was dictated using voice recognition software.  Despite best efforts to proofread,  errors can occur which can change the documentation meaning.     Lennice Sites, DO 05/27/19 2158

## 2019-05-27 NOTE — Consult Note (Signed)
Referring Physician: Dr. Johnney Killian    Chief Complaint: Right sided weakness  HPI: Natalie Bond is an 63 y.o. female presenting for evaluation after neurological symptoms which began on Wednesday worsened over the past 24 hours. Her symptoms began on Wednesday while washing a tank. She was laterally flexing her neck sharply to the right during this activity when she suddenly developed RUE sensory numbness, vertigo, unsteady gait, leaning to the right while standing/ambulating nausea. She also noted right sided weakness involving her arm and leg. She felt incoordinated on the right. She required her husband's help to ambulate, but this improved by this weekend, followed by the abrupt re-worsening which precipitated her visit to the ED.   Past Medical History:  Diagnosis Date  . Arthritis    back, wrists  . Cancer ([place])    skin cancer  . Complication of anesthesia   . Diabetes mellitus without complication ([place])    pt recently started on metformin  . GERD (gastroesophageal reflux disease)   . Headache   . High cholesterol   . History of bronchitis   . History of kidney stones    2015  . Hypertension   . PONV (postoperative nausea and vomiting)   . Sleep apnea    pt. does not use a CPAP or BiPAP  . Stroke ([place])    Pt. states possible mini stroke in 2014, no deficits    Past Surgical History:  Procedure Laterality Date  . ABDOMINAL HYSTERECTOMY    . BACK SURGERY    . CARDIAC CATHETERIZATION     01/27/15 [facility]): EF 60%, normal coronaries.  Mortimer Fries SUSPENSION PLASTY Right 12/12/2018   Procedure: RIGHT THUMB CARPOMETACARPAL ([place]) ARTHROPLASTY;  Surgeon: Leandrew Koyanagi, MD;  Location: [facility];  Service: Orthopedics;  Laterality: Right;  . CATARACT EXTRACTION, BILATERAL    . CHOLECYSTECTOMY  07/2016   done [place]  . EYE SURGERY    . HERNIA REPAIR Left 1990   left groin   . LOWER EXTREMITY ANGIOGRAM Left 06/08/2015   Procedure: Ascending Venogram  Iliac  Vein ;  Surgeon: Elam Dutch, MD;  Location: [facility] OR;  Service: Vascular;  Laterality: Left;  ;  [facility] [place] HAVE BEEN NOTIFIED/ CONFIRMED  . PARTIAL HYSTERECTOMY    . SHOULDER SURGERY    . TOTAL HIP ARTHROPLASTY Left 10/21/2016   Procedure: LEFT TOTAL HIP ARTHROPLASTY ANTERIOR APPROACH;  Surgeon: Mcarthur Rossetti, MD;  Location: [facility];  Service: Orthopedics;  Laterality: Left;  . WRIST SURGERY Bilateral    carpal tunnel    Family History  Problem Relation Age of Onset  . Cancer Mother   . Diabetes Mother   . Heart disease Mother   . Hyperlipidemia Mother   . Hypertension Mother   . Cancer Father   . Diabetes Father   . Heart disease Father   . Hyperlipidemia Father   . Hypertension Father   . Varicose Veins Father   . Cancer Sister   . Heart disease Sister   . Hyperlipidemia Sister   . Cancer Brother   . Diabetes Brother   . Hyperlipidemia Brother    Social History:  reports that she has never smoked. She has never used smokeless tobacco. She reports that she does not drink alcohol or use drugs.  Allergies:  Allergies  Allergen Reactions  . Dilaudid [Hydromorphone Hcl] Anaphylaxis    [name]   . Penicillins Anaphylaxis and Rash  Has patient had a PCN reaction causing immediate rash, facial/tongue/throat swelling, SOB or lightheadedness with hypotension: Yes Has patient had a PCN reaction causing severe rash involving mucus membranes or skin necrosis: No Has patient had a PCN reaction that required hospitalization Yes Has patient had a PCN reaction occurring within the last 10 years: No If all of the above answers are "NO", then may proceed with Cephalosporin use.     Home Medications:    ROS: Has right sided neck pain and headache. Right sided subjective visual field deficit in right eye. Some dysphagia. No CP, SOB, fever/chills, cough, abdominal pain. Had an episode of urinary incontinence during CT, which  has never happened before. Other ROS as per HPI with all other systems negative.   Physical Examination: Blood pressure (!) 138/91, pulse 84, temperature 98.4 F (36.9 C), temperature source Oral, resp. rate 19, height 5\' 7"  (1.702 m), weight 81.6 kg, SpO2 97 %.  HEENT: [place]/AT Lungs: Respirations unlabored Ext: No edema  Neurologic Examination: Mental Status: Awake and alert. Oriented. Pleasant and cooperative. Thought content appropriate.  Speech fluent without evidence of aphasia. No dysarthria. Able to follow all commands without difficulty. Cranial Nerves: II:  Visual fields intact OU with each eye tested individually. Right pupil slightly larger than left; reactivity is equally brisk.  III,IV, VI: No ptosis. EOMI without nystagmus.  V,VII: Smile symmetric. Decreased temp sensation right face.  VIII: hearing intact to voice IX,X: No hypophonia XI: Symmetric shoulder shrug XII: Midline tongue extension  Motor: RUE: 4/5 proximally and distally RLE 4+/5 LUE and LLE 5/5 No pronator drift.  Sensory: Temp decreased to RUE and RLE Deep Tendon Reflexes:  Normoactive to mildly hypoactive without asymmetry.  Plantars: Right: downgoing   Left: downgoing Cerebellar: No ataxia with FNF bilaterally. Mild ataxia with H-S on the right Gait: Deferred  Results for orders placed or performed during the hospital encounter of 05/27/19 (from the past 48 hour(s))  Protime-INR     Status: None   Collection Time: 05/27/19 10:40 AM  Result Value Ref Range   Prothrombin Time 13.1 11.4 - 15.2 seconds   INR 1.0 0.8 - 1.2    Comment: (NOTE) INR goal varies based on device and disease states. Performed at [facility], [place] [address] [place], [place] [address]   APTT     Status: None   Collection Time: 05/27/19 10:40 AM  Result Value Ref Range   aPTT 29 24 - 36 seconds    Comment: Performed at [facility] [address], [place], [place] [address]  CBC     Status: None    Collection Time: 05/27/19 10:40 AM  Result Value Ref Range   WBC 8.4 4.0 - 10.5 K/uL   RBC 4.28 3.87 - 5.11 MIL/uL   Hemoglobin 13.3 12.0 - 15.0 g/dL   HCT 40.2 36.0 - 46.0 %   MCV 93.9 80.0 - 100.0 fL   MCH 31.1 26.0 - 34.0 pg   MCHC 33.1 30.0 - 36.0 g/dL   RDW 12.3 11.5 - 15.5 %   Platelets 288 150 - 400 K/uL   nRBC 0.0 0.0 - 0.2 %    Comment: Performed at [facility], [place] [address], [place], [place] [address]  Differential     Status: None   Collection Time: 05/27/19 10:40 AM  Result Value Ref Range   Neutrophils Relative % 56 %   Neutro Abs 4.8 1.7 - 7.7 K/uL   Lymphocytes Relative  30 %   Lymphs Abs 2.5 0.7 - 4.0 K/uL   Monocytes Relative 8 %   Monocytes Absolute 0.6 0.1 - 1.0 K/uL   Eosinophils Relative 4 %   Eosinophils Absolute 0.3 0.0 - 0.5 K/uL   Basophils Relative 1 %   Basophils Absolute 0.1 0.0 - 0.1 K/uL   Immature Granulocytes 1 %   Abs Immature Granulocytes 0.04 0.00 - 0.07 K/uL    Comment: Performed at [facility], [place] [address] [place], [place] [address]  Comprehensive metabolic panel     Status: Abnormal   Collection Time: 05/27/19 10:40 AM  Result Value Ref Range   Sodium 140 135 - 145 mmol/L   Potassium 3.8 3.5 - 5.1 mmol/L   Chloride 105 98 - 111 mmol/L   CO2 24 22 - 32 mmol/L   Glucose, Bld 109 (H) 70 - 99 mg/dL   BUN 17 8 - 23 mg/dL   Creatinine, Ser 1.03 (H) 0.44 - 1.00 mg/dL   Calcium 9.2 8.9 - 10.3 mg/dL   Total Protein 7.0 6.5 - 8.1 g/dL   Albumin 3.6 3.5 - 5.0 g/dL   AST 29 15 - 41 U/L   ALT 21 0 - 44 U/L   Alkaline Phosphatase 85 38 - 126 U/L   Total Bilirubin 0.4 0.3 - 1.2 mg/dL   GFR calc non Af Amer 59 (L) >60 mL/min   GFR calc Af Amer >60 >60 mL/min   Anion gap 11 5 - 15    Comment: Performed at [facility] [address], [place], [place] [address]  I-stat chem 8, ED     Status: Abnormal   Collection Time: 05/27/19 10:52 AM  Result Value Ref Range   Sodium 138 135 - 145 mmol/L   Potassium 3.6 3.5 - 5.1  mmol/L   Chloride 104 98 - 111 mmol/L   BUN 18 8 - 23 mg/dL   Creatinine, Ser 1.00 0.44 - 1.00 mg/dL   Glucose, Bld 106 (H) 70 - 99 mg/dL   Calcium, Ion 1.19 1.15 - 1.40 mmol/L   TCO2 25 22 - 32 mmol/L   Hemoglobin 13.6 12.0 - 15.0 g/dL   HCT 40.0 36.0 - 46.0 %   Ct Head Wo Contrast  Result Date: 05/27/2019 CLINICAL DATA:  Possible stroke. Right-sided headache with blurred vision. EXAM: CT HEAD WITHOUT CONTRAST TECHNIQUE: Contiguous axial images were obtained from the base of the skull through the vertex without intravenous contrast. COMPARISON:  01/01/2014 FINDINGS: Brain: No evidence of acute infarction, hemorrhage, hydrocephalus, extra-axial collection or mass lesion/mass effect. Vascular: No hyperdense vessel or unexpected calcification. Skull: Normal. Negative for fracture or focal lesion. Sinuses/Orbits: No acute finding.  Bilateral cataract resection IMPRESSION: Negative head CT Electronically Signed   By: Monte Fantasia M.D.   On: 05/27/2019 11:08    Assessment: 63 y.o. female with probable late subacute ischemic stroke 1. Symptoms began on Wednesday 2. Exam and history most consistently localize as a left thalamic lacunar infarction or a right cerebellar stroke  3. Stroke Risk Factors - DM, HTN, sleep apnea and symptoms of stroke in 2014  Plan: 1. MRI and MRA of the brain without contrast.  2. Continue ASA 3. Phenergan 25 mg IV x 1 for headache 4. Additional recommendations pending MRI results.   @Electronically  signed: Dr. Kerney Elbe  05/27/2019, 1:05 PM

## 2019-05-27 NOTE — ED Notes (Signed)
Patient transported to CT 

## 2019-05-27 NOTE — ED Triage Notes (Signed)
Patient brought in by husband from home. Starting Tuesday pt began having a right sided headache. Then on Thursday about 3-4pm patient was cleaning when she got a dizzy spell and fell. Patient states she began having right eye blurred vision, right arm and leg numbness. Husband states pt has hx of torn retina on right eye and bilateral cataract surgery.

## 2019-05-27 NOTE — ED Notes (Signed)
Neurology at bedside.

## 2019-05-28 ENCOUNTER — Observation Stay (HOSPITAL_BASED_OUTPATIENT_CLINIC_OR_DEPARTMENT_OTHER): Payer: PRIVATE HEALTH INSURANCE

## 2019-05-28 DIAGNOSIS — G43109 Migraine with aura, not intractable, without status migrainosus: Secondary | ICD-10-CM | POA: Diagnosis not present

## 2019-05-28 DIAGNOSIS — R29898 Other symptoms and signs involving the musculoskeletal system: Secondary | ICD-10-CM | POA: Diagnosis not present

## 2019-05-28 DIAGNOSIS — I639 Cerebral infarction, unspecified: Secondary | ICD-10-CM | POA: Diagnosis not present

## 2019-05-28 DIAGNOSIS — E119 Type 2 diabetes mellitus without complications: Secondary | ICD-10-CM | POA: Diagnosis not present

## 2019-05-28 DIAGNOSIS — I10 Essential (primary) hypertension: Secondary | ICD-10-CM | POA: Diagnosis not present

## 2019-05-28 LAB — LIPID PANEL
Cholesterol: 217 mg/dL — ABNORMAL HIGH (ref 0–200)
HDL: 42 mg/dL (ref >40)
LDL Cholesterol: 98 mg/dL (ref 0–99)
Total CHOL/HDL Ratio: 5.2 RATIO
Triglycerides: 383 mg/dL — ABNORMAL HIGH (ref <150)
VLDL: 77 mg/dL — ABNORMAL HIGH (ref 0–40)

## 2019-05-28 LAB — GLUCOSE, CAPILLARY
Glucose-Capillary: 105 mg/dL — ABNORMAL HIGH (ref 70–99)
Glucose-Capillary: 92 mg/dL (ref 70–99)
Glucose-Capillary: 107 mg/dL — ABNORMAL HIGH (ref 70–99)
Glucose-Capillary: 116 mg/dL — ABNORMAL HIGH (ref 70–99)
Glucose-Capillary: 92 mg/dL (ref 70–99)

## 2019-05-28 LAB — HEMOGLOBIN A1C
Hgb A1c MFr Bld: 6.1 % — ABNORMAL HIGH (ref 4.8–5.6)
Mean Plasma Glucose: 128.37 mg/dL

## 2019-05-28 LAB — HIV ANTIBODY (ROUTINE TESTING W REFLEX): HIV Screen 4th Generation wRfx: NONREACTIVE

## 2019-05-28 LAB — NOVEL CORONAVIRUS, NAA (HOSP ORDER, SEND-OUT TO REF LAB; TAT 18-24 HRS): SARS-CoV-2, NAA: NOT DETECTED

## 2019-05-28 LAB — ECHOCARDIOGRAM COMPLETE
Height: 67 in
Weight: 2870.4 oz

## 2019-05-28 MED ORDER — BUTALBITAL-APAP-CAFFEINE 50-325-40 MG PO TABS
1.0000 | ORAL_TABLET | Freq: Two times a day (BID) | ORAL | Status: DC | PRN
  Administered 2019-05-28 – 2019-05-29 (×2): 1 via ORAL
  Filled 2019-05-28 (×2): qty 1

## 2019-05-28 MED ORDER — ATORVASTATIN CALCIUM 10 MG PO TABS
20.0000 mg | ORAL_TABLET | Freq: Every day | ORAL | Status: DC
  Administered 2019-05-28: 20 mg via ORAL
  Filled 2019-05-28: qty 2

## 2019-05-28 MED ORDER — ALUM & MAG HYDROXIDE-SIMETH 200-200-20 MG/5ML PO SUSP
30.0000 mL | ORAL | Status: DC | PRN
  Administered 2019-05-28: 30 mL via ORAL
  Filled 2019-05-28: qty 30

## 2019-05-28 MED ORDER — PANTOPRAZOLE SODIUM 40 MG PO TBEC
40.0000 mg | DELAYED_RELEASE_TABLET | Freq: Every day | ORAL | Status: DC
  Administered 2019-05-28 – 2019-05-29 (×2): 40 mg via ORAL
  Filled 2019-05-28 (×2): qty 1

## 2019-05-28 MED ORDER — ASPIRIN EC 81 MG PO TBEC
81.0000 mg | DELAYED_RELEASE_TABLET | Freq: Every day | ORAL | Status: DC
  Administered 2019-05-28 – 2019-05-29 (×2): 81 mg via ORAL
  Filled 2019-05-28 (×2): qty 1

## 2019-05-28 NOTE — TOC Transition Note (Signed)
Transition of Care [facility]) - CM/SW Discharge Note   Patient Details  Name: Natalie Bond MRN: [id number] Date of Birth: 20-Apr-1956  [facility] CM/SW Contact:  Geralynn Ochs, LCSW Phone Number: 05/28/2019, 5:32 PM   Clinical Narrative:   CSW met with patient and spouse at bedside to discuss discharge plans. Patient to return home with spouse available 24/7 and able to assist. CSW discussed recommendations for either home health vs outpatient, and patient indicated that she didn't know if her insurance would cover. Per patient, she had to sign up for a basic insurance plan after her job let her go and it doesn't seem to cover much. CSW reached out to insurance provider to determine benefits available, and patient has no home health or outpatient benefit. Patient indicated that she is not able to private pay for services as she is out of work right now. CSW reached out to PT/OT to provide patient with exercises that she can perform at home to improve her strength and mobility. Patient appreciative. CSW alerted MD. No further needs at this time.      Barriers to Discharge: Continued Medical Work up   Patient Goals and CMS Choice Patient states their goals for this hospitalization and ongoing recovery are:: get back home, walk better      Discharge Placement                       Discharge Plan and Services                                     Social Determinants of Health (SDOH) Interventions     Readmission Risk Interventions No flowsheet data found.

## 2019-05-28 NOTE — Progress Notes (Signed)
Report received from [facility] [place] (ED) at [phone]. Pt on unit at 0058, AOx4, c/o headache. Pt initially failed swallow screen in ED, but was able to successfully pass the swallow screen upon arrival to the unit. Diet ordered, menu provided to pt, and dietary services explained to pt.

## 2019-05-28 NOTE — Progress Notes (Signed)
STROKE TEAM PROGRESS NOTE   INTERVAL HISTORY Pt standing in front of the sink and able to go back to sit in bed with minimal assistance. She recounted HPI with me.  She stated that last Thursday night, she was cleaning the fish tank, had a prolonged position of neck leaning towards right.  Then she had a sudden onset of right neck pain, dizziness, falling, not able to get up, and right face and arm and leg numbness.  Symptoms getting better through Friday and Saturday.  However on Sunday her symptoms restarted with right neck pain, right facial arm and leg numbness, feeling heaviness, she went to see her PCP yesterday and recommended come to further work-up.  Vitals:   05/28/19 0015 05/28/19 0102 05/28/19 0413 05/28/19 0449  BP: 134/90 139/84 117/77   Pulse: 74 68 77   Resp: 15 18 18    Temp:  (!) 97.5 F (36.4 C) (!) 97.4 F (36.3 C) 97.8 F (36.6 C)  TempSrc:  Oral Oral Oral  SpO2: 95% 100% 100%   Weight:  81.4 kg    Height:  5\' 7"  (1.702 m)      CBC:  Recent Labs  Lab 05/27/19 1040 05/27/19 1052  WBC 8.4  --   NEUTROABS 4.8  --   HGB 13.3 13.6  HCT 40.2 40.0  MCV 93.9  --   PLT 288  --     Basic Metabolic Panel:  Recent Labs  Lab 05/27/19 1040 05/27/19 1052  NA 140 138  K 3.8 3.6  CL 105 104  CO2 24  --   GLUCOSE 109* 106*  BUN 17 18  CREATININE 1.03* 1.00  CALCIUM 9.2  --    Lipid Panel:     Component Value Date/Time   CHOL 217 (H) 05/28/2019 0113   TRIG 383 (H) 05/28/2019 0113   HDL 42 05/28/2019 0113   CHOLHDL 5.2 05/28/2019 0113   VLDL 77 (H) 05/28/2019 0113   LDLCALC 98 05/28/2019 0113   HgbA1c:  Lab Results  Component Value Date   HGBA1C 6.1 (H) 05/28/2019   Urine Drug Screen: No results found for: LABOPIA, COCAINSCRNUR, LABBENZ, AMPHETMU, THCU, LABBARB  Alcohol Level No results found for: ETH  IMAGING Ct Angio Head W Or Wo Contrast  Result Date: 05/27/2019 CLINICAL DATA:  63 y/o  F; Stroke, follow up right arm weakness. EXAM: CT ANGIOGRAPHY  HEAD AND NECK TECHNIQUE: Multidetector CT imaging of the head and neck was performed using the standard protocol during bolus administration of intravenous contrast. Multiplanar CT image reconstructions and MIPs were obtained to evaluate the vascular anatomy. Carotid stenosis measurements (when applicable) are obtained utilizing NASCET criteria, using the distal internal carotid diameter as the denominator. CONTRAST:  161mL OMNIPAQUE IOHEXOL 350 MG/ML SOLN COMPARISON:  05/27/2019 CT head, MRA head, and MRI of the head. FINDINGS: CTA NECK FINDINGS Aortic arch: Standard branching. Imaged portion shows no evidence of aneurysm or dissection. No significant stenosis of the major arch vessel origins. Right carotid system: No evidence of dissection, stenosis (50% or greater) or occlusion. Left carotid system: No evidence of dissection, stenosis (50% or greater) or occlusion. Vertebral arteries: Codominant. No evidence of dissection, stenosis (50% or greater) or occlusion. Skeleton: Spondylosis of the cervical spine with predominant right greater than left facet arthropathy. No acute osseous abnormality. Other neck: 16 mm nodule in right lobe of thyroid. Upper chest: Negative. Review of the MIP images confirms the above findings CTA HEAD FINDINGS Anterior circulation: No significant stenosis, proximal  occlusion, aneurysm, or vascular malformation. Posterior circulation: No significant stenosis, proximal occlusion, aneurysm, or vascular malformation. Venous sinuses: As permitted by contrast timing, patent. Anatomic variants: Small anterior and posterior communicating arteries. Review of the MIP images confirms the above findings IMPRESSION: 1. Patent carotid and vertebral arteries. No dissection, aneurysm, or hemodynamically significant stenosis utilizing NASCET criteria. 2. Patent anterior and posterior intracranial circulation. No large vessel occlusion, aneurysm, or significant stenosis. 3. 16 mm nodule in right lobe of  thyroid. Thyroid ultrasound is recommended on a nonemergent basis. Electronically Signed   By: Kristine Garbe M.D.   On: 05/27/2019 23:52   Dg Chest 2 View  Result Date: 05/27/2019 CLINICAL DATA:  Right arm weakness EXAM: CHEST - 2 VIEW COMPARISON:  01/01/2014 FINDINGS: Heart and mediastinal contours are within normal limits. No focal opacities or effusions. No acute bony abnormality. IMPRESSION: No active cardiopulmonary disease. Electronically Signed   By: Rolm Baptise M.D.   On: 05/27/2019 23:08   Ct Head Wo Contrast  Result Date: 05/27/2019 CLINICAL DATA:  Possible stroke. Right-sided headache with blurred vision. EXAM: CT HEAD WITHOUT CONTRAST TECHNIQUE: Contiguous axial images were obtained from the base of the skull through the vertex without intravenous contrast. COMPARISON:  01/01/2014 FINDINGS: Brain: No evidence of acute infarction, hemorrhage, hydrocephalus, extra-axial collection or mass lesion/mass effect. Vascular: No hyperdense vessel or unexpected calcification. Skull: Normal. Negative for fracture or focal lesion. Sinuses/Orbits: No acute finding.  Bilateral cataract resection IMPRESSION: Negative head CT Electronically Signed   By: Monte Fantasia M.D.   On: 05/27/2019 11:08   Ct Angio Neck W Or Wo Contrast  Result Date: 05/27/2019 CLINICAL DATA:  63 y/o  F; Stroke, follow up right arm weakness. EXAM: CT ANGIOGRAPHY HEAD AND NECK TECHNIQUE: Multidetector CT imaging of the head and neck was performed using the standard protocol during bolus administration of intravenous contrast. Multiplanar CT image reconstructions and MIPs were obtained to evaluate the vascular anatomy. Carotid stenosis measurements (when applicable) are obtained utilizing NASCET criteria, using the distal internal carotid diameter as the denominator. CONTRAST:  181mL OMNIPAQUE IOHEXOL 350 MG/ML SOLN COMPARISON:  05/27/2019 CT head, MRA head, and MRI of the head. FINDINGS: CTA NECK FINDINGS Aortic arch:  Standard branching. Imaged portion shows no evidence of aneurysm or dissection. No significant stenosis of the major arch vessel origins. Right carotid system: No evidence of dissection, stenosis (50% or greater) or occlusion. Left carotid system: No evidence of dissection, stenosis (50% or greater) or occlusion. Vertebral arteries: Codominant. No evidence of dissection, stenosis (50% or greater) or occlusion. Skeleton: Spondylosis of the cervical spine with predominant right greater than left facet arthropathy. No acute osseous abnormality. Other neck: 16 mm nodule in right lobe of thyroid. Upper chest: Negative. Review of the MIP images confirms the above findings CTA HEAD FINDINGS Anterior circulation: No significant stenosis, proximal occlusion, aneurysm, or vascular malformation. Posterior circulation: No significant stenosis, proximal occlusion, aneurysm, or vascular malformation. Venous sinuses: As permitted by contrast timing, patent. Anatomic variants: Small anterior and posterior communicating arteries. Review of the MIP images confirms the above findings IMPRESSION: 1. Patent carotid and vertebral arteries. No dissection, aneurysm, or hemodynamically significant stenosis utilizing NASCET criteria. 2. Patent anterior and posterior intracranial circulation. No large vessel occlusion, aneurysm, or significant stenosis. 3. 16 mm nodule in right lobe of thyroid. Thyroid ultrasound is recommended on a nonemergent basis. Electronically Signed   By: Kristine Garbe M.D.   On: 05/27/2019 23:52   Mr Angio Head Wo Contrast  Result Date: 05/27/2019 CLINICAL DATA:  Initial evaluation for acute right-sided numbness and weakness, vertigo, unsteady. EXAM: MRI HEAD WITHOUT CONTRAST MRA HEAD WITHOUT CONTRAST TECHNIQUE: Multiplanar, multiecho pulse sequences of the brain and surrounding structures were obtained without intravenous contrast. Angiographic images of the head were obtained using MRA technique  without contrast. COMPARISON:  Prior CT from earlier the same day. FINDINGS: MRI HEAD FINDINGS Brain: Examination technically limited as the patient was unable to tolerate the full length of the exam. Axial coronal DWI sequence, axial T2 and FLAIR sequences, and axial SWI sequence only were performed. Additionally, images are moderately degraded by motion artifact. Cerebral volume within normal limits for age. Mild scattered subcentimeter T2 signal abnormality noted within the periventricular and deep white matter both cerebral hemispheres, most likely related to chronic microvascular ischemic changes, felt to be within normal limits for age. No abnormal foci of restricted diffusion to suggest acute or subacute ischemia. Gray-white matter differentiation maintained. No encephalomalacia to suggest chronic cortical infarction. No foci of susceptibility artifact to suggest acute or chronic intracranial hemorrhage. No mass lesion, midline shift or mass effect. No hydrocephalus. No extra-axial fluid collection. Vascular: Major intracranial vascular flow voids are maintained. Skull and upper cervical spine: Craniocervical junction grossly within normal limits on this limited exam. No focal marrow replacing lesion. Scalp soft tissues unremarkable. Sinuses/Orbits: Globes and orbital soft tissues demonstrate no acute finding. Patient status post bilateral ocular lens replacement. Paranasal sinuses are clear. Small bilateral mastoid effusions, left greater than right, of doubtful significance. Inner ear structures grossly normal. Other: None. MRA HEAD FINDINGS ANTERIOR CIRCULATION: Examination technically degraded by motion artifact. Distal cervical segments of the internal carotid arteries are patent with symmetric antegrade flow. Petrous segments widely patent. Scattered atheromatous irregularity throughout the cavernous/supraclinoid ICAs without definite hemodynamically significant stenosis. ICA termini well perfused. A1  segments widely patent. Normal anterior communicating artery complex. Anterior cerebral arteries patent to their distal aspects without high-grade stenosis. No M1 stenosis or occlusion. Normal MCA bifurcations. Distal MCA branches well perfused and symmetric. POSTERIOR CIRCULATION: Dominant right vertebral artery patent to the vertebrobasilar junction without stenosis. Left vertebral artery diminutive with scattered atheromatous irregularity without definite high-grade stenosis. Left PICA patent proximally. Right PICA not well seen. Basilar patent to its distal aspect without flow-limiting stenosis. Superior cerebral arteries patent proximally. Both of the posterior cerebral arteries appear to be primarily supplied via the basilar, although a tiny bilateral posterior communicating arteries noted. PCAs patent to their distal aspects without high-grade stenosis. No appreciable aneurysm identified on this motion degraded exam. IMPRESSION: MRI HEAD IMPRESSION: 1. Technically limited examination due to as motion artifact. Inability to tolerate the full length of the study as well 2. No acute intracranial infarct or other abnormality. 3. Mild chronic microvascular ischemic disease for age. MRA HEAD IMPRESSION: 1. Technically limited exam due to motion artifact. 2. Negative intracranial MRA for large vessel occlusion. No definite hemodynamically significant or correctable stenosis. Electronically Signed   By: Jeannine Boga M.D.   On: 05/27/2019 21:12   Mr Brain Wo Contrast  Result Date: 05/27/2019 CLINICAL DATA:  Initial evaluation for acute right-sided numbness and weakness, vertigo, unsteady. EXAM: MRI HEAD WITHOUT CONTRAST MRA HEAD WITHOUT CONTRAST TECHNIQUE: Multiplanar, multiecho pulse sequences of the brain and surrounding structures were obtained without intravenous contrast. Angiographic images of the head were obtained using MRA technique without contrast. COMPARISON:  Prior CT from earlier the same  day. FINDINGS: MRI HEAD FINDINGS Brain: Examination technically limited as the patient was unable  to tolerate the full length of the exam. Axial coronal DWI sequence, axial T2 and FLAIR sequences, and axial SWI sequence only were performed. Additionally, images are moderately degraded by motion artifact. Cerebral volume within normal limits for age. Mild scattered subcentimeter T2 signal abnormality noted within the periventricular and deep white matter both cerebral hemispheres, most likely related to chronic microvascular ischemic changes, felt to be within normal limits for age. No abnormal foci of restricted diffusion to suggest acute or subacute ischemia. Gray-white matter differentiation maintained. No encephalomalacia to suggest chronic cortical infarction. No foci of susceptibility artifact to suggest acute or chronic intracranial hemorrhage. No mass lesion, midline shift or mass effect. No hydrocephalus. No extra-axial fluid collection. Vascular: Major intracranial vascular flow voids are maintained. Skull and upper cervical spine: Craniocervical junction grossly within normal limits on this limited exam. No focal marrow replacing lesion. Scalp soft tissues unremarkable. Sinuses/Orbits: Globes and orbital soft tissues demonstrate no acute finding. Patient status post bilateral ocular lens replacement. Paranasal sinuses are clear. Small bilateral mastoid effusions, left greater than right, of doubtful significance. Inner ear structures grossly normal. Other: None. MRA HEAD FINDINGS ANTERIOR CIRCULATION: Examination technically degraded by motion artifact. Distal cervical segments of the internal carotid arteries are patent with symmetric antegrade flow. Petrous segments widely patent. Scattered atheromatous irregularity throughout the cavernous/supraclinoid ICAs without definite hemodynamically significant stenosis. ICA termini well perfused. A1 segments widely patent. Normal anterior communicating artery  complex. Anterior cerebral arteries patent to their distal aspects without high-grade stenosis. No M1 stenosis or occlusion. Normal MCA bifurcations. Distal MCA branches well perfused and symmetric. POSTERIOR CIRCULATION: Dominant right vertebral artery patent to the vertebrobasilar junction without stenosis. Left vertebral artery diminutive with scattered atheromatous irregularity without definite high-grade stenosis. Left PICA patent proximally. Right PICA not well seen. Basilar patent to its distal aspect without flow-limiting stenosis. Superior cerebral arteries patent proximally. Both of the posterior cerebral arteries appear to be primarily supplied via the basilar, although a tiny bilateral posterior communicating arteries noted. PCAs patent to their distal aspects without high-grade stenosis. No appreciable aneurysm identified on this motion degraded exam. IMPRESSION: MRI HEAD IMPRESSION: 1. Technically limited examination due to as motion artifact. Inability to tolerate the full length of the study as well 2. No acute intracranial infarct or other abnormality. 3. Mild chronic microvascular ischemic disease for age. MRA HEAD IMPRESSION: 1. Technically limited exam due to motion artifact. 2. Negative intracranial MRA for large vessel occlusion. No definite hemodynamically significant or correctable stenosis. Electronically Signed   By: Jeannine Boga M.D.   On: 05/27/2019 21:12    PHYSICAL EXAM  Temp:  [97.4 F (36.3 C)-97.9 F (36.6 C)] 97.9 F (36.6 C) (06/30 1710) Pulse Rate:  [68-84] 84 (06/30 1710) Resp:  [15-19] 16 (06/30 1710) BP: (117-146)/(77-90) 146/88 (06/30 1710) SpO2:  [95 %-100 %] 98 % (06/30 1710) Weight:  [81.4 kg] 81.4 kg (06/30 0102)  General - Well nourished, well developed, in no apparent distress.  Ophthalmologic - fundi not visualized due to noncooperation.  Cardiovascular - Regular rate and rhythm.  Mental Status -  Level of arousal and orientation to time,  place, and person were intact. Language including expression, naming, repetition, comprehension was assessed and found intact.  Cranial Nerves II - XII - II - Visual field intact OU. III, IV, VI - Extraocular movements intact. V - Facial sensation decreased on the right, 60% of the left. VII - Facial movement intact bilaterally. VIII - Hearing & vestibular intact bilaterally. X -  Palate elevates symmetrically. XI - Chin turning & shoulder shrug intact bilaterally. XII - Tongue protrusion intact.  Motor Strength - The patient's strength was normal in all extremities and pronator drift was absent.  Bulk was normal and fasciculations were absent.   Motor Tone - Muscle tone was assessed at the neck and appendages and was normal.  Reflexes - The patient's reflexes were symmetrical in all extremities and she had no pathological reflexes.  Sensory - Light touch, temperature/pinprick were assessed and were decreased on the right, 60% of the left  Coordination - The patient had normal movements in the hands with no ataxia or dysmetria.  Tremor was absent.  Gait and Station - deferred.   ASSESSMENT/PLAN Natalie Bond is a 62 y.o. female with history of HTN, HLD, DB, GERD, TIA presenting with R sided weakness/numbness arm and leg.   R sided weakness/numbness - ?  Complicated migraine vs cervical radiculopathy, needs to rule out cervical myelopathy  CT head negative   MRI  No acute infarct. Small vessel disease.   MRA  No LVO  CTA head & neck no LVO. No significant stenosis. R thyroid nodule; [place] recommended.  MRI C-spine pending  2D Echo EF 60 to 65%  LDL 98  HgbA1c 6.1  SCDs for VTE prophylaxis  aspirin 81 mg daily prior to admission, now on aspirin 81 mg daily. Continue at d/c  Therapy recommendations:  HH PT  Disposition:  pending   Migraines  Patient has history of migraines with headache and nausea vomiting  Current presentation is not typical  migraine  However, it could be complicated migraine without headache triggered by neck pain  Continue Topamax nightly for migraine prevention  Hypertension  Stable . BP goal normotensive  Hyperlipidemia  Home meds:  No statin  Now on lipitor 20 given history of diabetes  LDL 98  Continue statin at discharge  Diabetes type II Controlled Diabetic Neuropathy  HgbA1c 6.1, at goal < 7.0  SSI  CBG monitoring  PCP follow-up  Other Stroke Risk Factors  Overweight, Body mass index is 28.1 kg/m., recommend weight loss, diet and exercise as appropriate   Obstructive sleep apnea, not on CPAP at home  Other Active Problems  Chronic back pain and neck pain  GERD  Thyroid nodule, [place] recommended  Hospital day # 0  Rosalin Hawking, MD PhD Stroke Neurology 05/28/2019 6:44 PM    To contact Stroke Continuity provider, please refer to [email or web address] After hours, contact General Neurology

## 2019-05-28 NOTE — Plan of Care (Signed)
  Problem: Education: Goal: Knowledge of General Education information will improve Description: Including pain rating scale, medication(s)/side effects and non-pharmacologic comfort measures Outcome: Progressing   Problem: Health Behavior/Discharge Planning: Goal: Ability to manage health-related needs will improve Outcome: Progressing   Problem: Clinical Measurements: Goal: Ability to maintain clinical measurements within normal limits will improve Outcome: Progressing Goal: Will remain free from infection Outcome: Progressing Goal: Diagnostic test results will improve Outcome: Progressing Goal: Respiratory complications will improve Outcome: Progressing Goal: Cardiovascular complication will be avoided Outcome: Progressing   Problem: Activity: Goal: Risk for activity intolerance will decrease Outcome: Progressing   Problem: Nutrition: Goal: Adequate nutrition will be maintained Outcome: Progressing   Problem: Coping: Goal: Level of anxiety will decrease Outcome: Progressing   Problem: Elimination: Goal: Will not experience complications related to bowel motility Outcome: Progressing Goal: Will not experience complications related to urinary retention Outcome: Progressing   Problem: Pain Managment: Goal: General experience of comfort will improve Outcome: Progressing   Problem: Safety: Goal: Ability to remain free from injury will improve Outcome: Progressing   Problem: Skin Integrity: Goal: Risk for impaired skin integrity will decrease Outcome: Progressing   Problem: Education: Goal: Knowledge of disease or condition will improve Outcome: Progressing Goal: Knowledge of secondary prevention will improve Outcome: Progressing Goal: Knowledge of patient specific risk factors addressed and post discharge goals established will improve Outcome: Progressing   Problem: Coping: Goal: Will verbalize positive feelings about self Outcome: Progressing Goal: Will  identify appropriate support needs Outcome: Progressing   Problem: Self-Care: Goal: Ability to participate in self-care as condition permits will improve Outcome: Progressing Goal: Verbalization of feelings and concerns over difficulty with self-care will improve Outcome: Progressing Goal: Ability to communicate needs accurately will improve Outcome: Progressing   Ival Bible, BSN, RN

## 2019-05-28 NOTE — Progress Notes (Signed)
PROGRESS NOTE    Natalie Bond  ZTI:[id number] DOB: 16-Sep-1956 DOA: 05/27/2019 PCP: Leanna Battles, MD   Brief Narrative:  Admitted with right sided weakness, through to be CVA, however MRI negative. Neuro consulted.  Assessment & Plan   Acute Right sided weakness/numbness, TIA vs complicated migraine? -CT head negative -CTA head and neck: Patent carotid and vertebral arteries.  No large vessel occlusion. -MRI/MRA head:Technically Limited exam due to motion artifact.  No acute intracranial infarct or other abnormality.  Negative intracranial MRA for large vessel occlusion. -LDL 98, Hemoglobin 6.1 -Currently on aspirin and statin -Echocardiogram pending -Neurology consulted and appreciated, pending further recommendations  Migraine headache -Continue Topamax  Diabetes mellitus, type II -On metformin at home -Continue ISS and CBG monitoring  -Hemoglobin A1c 6.1  Diabetic neuropathy -Continue gabapentin  Essential hypertension -continue metoprolol and clonidine  Chronic back pain -Continue home meds  GERD -Continue PPI  Thyroid nodule -Incidental finding -CT head and neck showed 69mm nodule in right lobe of thyroid.  Thyroid ultrasound is recommended on a nonemergent basis   Hyperlipidemia -lipid panel total cholesterol 217, HDL 42, LDL 98, triglycerides 383  DVT Prophylaxis  SCDs  Code Status: Full  Family Communication: None at bedside  Disposition Plan: Observation. Pending neurology recommendations. Home with home health.  Consultants Neurology  Procedures  None  Antibiotics   Anti-infectives (From admission, onward)   None      Subjective:   Natalie Bond seen and examined today.  Feels numbness is improving. Continues to have headache. Has some right sided weakness. Denies chest pain, shortness of breath, abdominal pain, N/V/D/C.  Objective:   Vitals:   05/28/19 0015 05/28/19 0102 05/28/19 0413 05/28/19 0449  BP: 134/90 139/84  117/77   Pulse: 74 68 77   Resp: 15 18 18    Temp:  (!) 97.5 F (36.4 C) (!) 97.4 F (36.3 C) 97.8 F (36.6 C)  TempSrc:  Oral Oral Oral  SpO2: 95% 100% 100%   Weight:  81.4 kg    Height:  5\' 7"  (1.702 m)      Intake/Output Summary (Last 24 hours) at 05/28/2019 1413 Last data filed at 05/28/2019 0614 Gross per 24 hour  Intake --  Output 250 ml  Net -250 ml   Filed Weights   05/27/19 1019 05/28/19 0102  Weight: 81.6 kg 81.4 kg    Exam  General: Well developed, well nourished, NAD, appears stated age  HEENT: NCAT, PERRLA, EOMI, Anicteic Sclera, mucous membranes moist.   Neck: Supple, no JVD, no masses  Cardiovascular: S1 S2 auscultated, no rubs, murmurs or gallops. Regular rate and rhythm.  Respiratory: Clear to auscultation bilaterally with equal chest rise  Abdomen: Soft, nontender, nondistended, + bowel sounds  Extremities: warm dry without cyanosis clubbing or edema  Neuro: AAOx3, mild nystagmus with righward gaze. RLE/RUE 4/5 Strength  Skin: Without rashes exudates or nodules  Psych: Normal affect and demeanor with intact judgement and insight   Data Reviewed: I have personally reviewed following labs and imaging studies  CBC: Recent Labs  Lab 05/27/19 1040 05/27/19 1052  WBC 8.4  --   NEUTROABS 4.8  --   HGB 13.3 13.6  HCT 40.2 40.0  MCV 93.9  --   PLT 288  --    Basic Metabolic Panel: Recent Labs  Lab 05/27/19 1040 05/27/19 1052  NA 140 138  K 3.8 3.6  CL 105 104  CO2 24  --   GLUCOSE 109* 106*  BUN 17 18  CREATININE 1.03* 1.00  CALCIUM 9.2  --    GFR: Estimated Creatinine Clearance: 64.8 mL/min (by C-G formula based on SCr of 1 mg/dL). Liver Function Tests: Recent Labs  Lab 05/27/19 1040  AST 29  ALT 21  ALKPHOS 85  BILITOT 0.4  PROT 7.0  ALBUMIN 3.6   No results for input(s): LIPASE, AMYLASE in the last 168 hours. No results for input(s): AMMONIA in the last 168 hours. Coagulation Profile: Recent Labs  Lab 05/27/19 1040   INR 1.0   Cardiac Enzymes: No results for input(s): CKTOTAL, CKMB, CKMBINDEX, TROPONINI in the last 168 hours. BNP (last 3 results) No results for input(s): PROBNP in the last 8760 hours. HbA1C: Recent Labs    05/28/19 0113  HGBA1C 6.1*   CBG: Recent Labs  Lab 05/28/19 0112 05/28/19 0632 05/28/19 1407  GLUCAP 105* 92 107*   Lipid Profile: Recent Labs    05/28/19 0113  CHOL 217*  HDL 42  LDLCALC 98  TRIG 383*  CHOLHDL 5.2   Thyroid Function Tests: No results for input(s): TSH, T4TOTAL, FREET4, T3FREE, THYROIDAB in the last 72 hours. Anemia Panel: No results for input(s): VITAMINB12, FOLATE, FERRITIN, TIBC, IRON, RETICCTPCT in the last 72 hours. Urine analysis: No results found for: COLORURINE, APPEARANCEUR, LABSPEC, PHURINE, GLUCOSEU, HGBUR, BILIRUBINUR, KETONESUR, PROTEINUR, UROBILINOGEN, NITRITE, LEUKOCYTESUR Sepsis Labs: @LABRCNTIP (procalcitonin:4,lacticidven:4)  ) Recent Results (from the past 240 hour(s))  Novel Coronavirus,NAA,(SEND-OUT TO REF LAB - TAT 24-48 hrs); Hosp Order     Status: None   Collection Time: 05/27/19  1:04 PM   Specimen: Nasopharyngeal Swab; Respiratory  Result Value Ref Range Status   SARS-CoV-2, NAA NOT DETECTED NOT DETECTED Final    Comment: (NOTE) This test was developed and its performance characteristics determined by [employer]. This test has not been FDA cleared or approved. This test has been authorized by FDA under an Emergency Use Authorization (EUA). This test is only authorized for the duration of time the declaration that circumstances exist justifying the authorization of the emergency use of in vitro diagnostic tests for detection of SARS-CoV-2 virus and/or diagnosis of COVID-19 infection under section 564(b)(1) of the Act, 21 U.S.C. 062IRS-8(N)(4), unless the authorization is terminated or revoked sooner. When diagnostic testing is negative, the possibility of a false negative result should be considered in  the context of a patient's recent exposures and the presence of clinical signs and symptoms consistent with COVID-19. An individual without symptoms of COVID-19 and who is not shedding SARS-CoV-2 virus would expect to have a negative (not detected) result in this assay. Performed  At: [facility] [address] [place], [place] [id number] Rush Farmer MD FG:[id number]    [place]  Final    Comment: Performed at [facility], [place] [address], [place], [place] [address]      Radiology Studies: Ct Angio Head W Or Wo Contrast  Result Date: 05/27/2019 CLINICAL DATA:  63 y/o  F; Stroke, follow up right arm weakness. EXAM: CT ANGIOGRAPHY HEAD AND NECK TECHNIQUE: Multidetector CT imaging of the head and neck was performed using the standard protocol during bolus administration of intravenous contrast. Multiplanar CT image reconstructions and MIPs were obtained to evaluate the vascular anatomy. Carotid stenosis measurements (when applicable) are obtained utilizing NASCET criteria, using the distal internal carotid diameter as the denominator. CONTRAST:  154mL OMNIPAQUE IOHEXOL 350 MG/ML SOLN COMPARISON:  05/27/2019 CT head, MRA head, and MRI of the head. FINDINGS: CTA NECK FINDINGS Aortic arch: Standard branching. Imaged  portion shows no evidence of aneurysm or dissection. No significant stenosis of the major arch vessel origins. Right carotid system: No evidence of dissection, stenosis (50% or greater) or occlusion. Left carotid system: No evidence of dissection, stenosis (50% or greater) or occlusion. Vertebral arteries: Codominant. No evidence of dissection, stenosis (50% or greater) or occlusion. Skeleton: Spondylosis of the cervical spine with predominant right greater than left facet arthropathy. No acute osseous abnormality. Other neck: 16 mm nodule in right lobe of thyroid. Upper chest: Negative. Review of the MIP images confirms the above findings CTA HEAD  FINDINGS Anterior circulation: No significant stenosis, proximal occlusion, aneurysm, or vascular malformation. Posterior circulation: No significant stenosis, proximal occlusion, aneurysm, or vascular malformation. Venous sinuses: As permitted by contrast timing, patent. Anatomic variants: Small anterior and posterior communicating arteries. Review of the MIP images confirms the above findings IMPRESSION: 1. Patent carotid and vertebral arteries. No dissection, aneurysm, or hemodynamically significant stenosis utilizing NASCET criteria. 2. Patent anterior and posterior intracranial circulation. No large vessel occlusion, aneurysm, or significant stenosis. 3. 16 mm nodule in right lobe of thyroid. Thyroid ultrasound is recommended on a nonemergent basis. Electronically Signed   By: Kristine Garbe M.D.   On: 05/27/2019 23:52   Dg Chest 2 View  Result Date: 05/27/2019 CLINICAL DATA:  Right arm weakness EXAM: CHEST - 2 VIEW COMPARISON:  01/01/2014 FINDINGS: Heart and mediastinal contours are within normal limits. No focal opacities or effusions. No acute bony abnormality. IMPRESSION: No active cardiopulmonary disease. Electronically Signed   By: Rolm Baptise M.D.   On: 05/27/2019 23:08   Ct Head Wo Contrast  Result Date: 05/27/2019 CLINICAL DATA:  Possible stroke. Right-sided headache with blurred vision. EXAM: CT HEAD WITHOUT CONTRAST TECHNIQUE: Contiguous axial images were obtained from the base of the skull through the vertex without intravenous contrast. COMPARISON:  01/01/2014 FINDINGS: Brain: No evidence of acute infarction, hemorrhage, hydrocephalus, extra-axial collection or mass lesion/mass effect. Vascular: No hyperdense vessel or unexpected calcification. Skull: Normal. Negative for fracture or focal lesion. Sinuses/Orbits: No acute finding.  Bilateral cataract resection IMPRESSION: Negative head CT Electronically Signed   By: Monte Fantasia M.D.   On: 05/27/2019 11:08   Ct Angio Neck W  Or Wo Contrast  Result Date: 05/27/2019 CLINICAL DATA:  63 y/o  F; Stroke, follow up right arm weakness. EXAM: CT ANGIOGRAPHY HEAD AND NECK TECHNIQUE: Multidetector CT imaging of the head and neck was performed using the standard protocol during bolus administration of intravenous contrast. Multiplanar CT image reconstructions and MIPs were obtained to evaluate the vascular anatomy. Carotid stenosis measurements (when applicable) are obtained utilizing NASCET criteria, using the distal internal carotid diameter as the denominator. CONTRAST:  158mL OMNIPAQUE IOHEXOL 350 MG/ML SOLN COMPARISON:  05/27/2019 CT head, MRA head, and MRI of the head. FINDINGS: CTA NECK FINDINGS Aortic arch: Standard branching. Imaged portion shows no evidence of aneurysm or dissection. No significant stenosis of the major arch vessel origins. Right carotid system: No evidence of dissection, stenosis (50% or greater) or occlusion. Left carotid system: No evidence of dissection, stenosis (50% or greater) or occlusion. Vertebral arteries: Codominant. No evidence of dissection, stenosis (50% or greater) or occlusion. Skeleton: Spondylosis of the cervical spine with predominant right greater than left facet arthropathy. No acute osseous abnormality. Other neck: 16 mm nodule in right lobe of thyroid. Upper chest: Negative. Review of the MIP images confirms the above findings CTA HEAD FINDINGS Anterior circulation: No significant stenosis, proximal occlusion, aneurysm, or vascular malformation. Posterior circulation:  No significant stenosis, proximal occlusion, aneurysm, or vascular malformation. Venous sinuses: As permitted by contrast timing, patent. Anatomic variants: Small anterior and posterior communicating arteries. Review of the MIP images confirms the above findings IMPRESSION: 1. Patent carotid and vertebral arteries. No dissection, aneurysm, or hemodynamically significant stenosis utilizing NASCET criteria. 2. Patent anterior and  posterior intracranial circulation. No large vessel occlusion, aneurysm, or significant stenosis. 3. 16 mm nodule in right lobe of thyroid. Thyroid ultrasound is recommended on a nonemergent basis. Electronically Signed   By: Kristine Garbe M.D.   On: 05/27/2019 23:52   Mr Angio Head Wo Contrast  Result Date: 05/27/2019 CLINICAL DATA:  Initial evaluation for acute right-sided numbness and weakness, vertigo, unsteady. EXAM: MRI HEAD WITHOUT CONTRAST MRA HEAD WITHOUT CONTRAST TECHNIQUE: Multiplanar, multiecho pulse sequences of the brain and surrounding structures were obtained without intravenous contrast. Angiographic images of the head were obtained using MRA technique without contrast. COMPARISON:  Prior CT from earlier the same day. FINDINGS: MRI HEAD FINDINGS Brain: Examination technically limited as the patient was unable to tolerate the full length of the exam. Axial coronal DWI sequence, axial T2 and FLAIR sequences, and axial SWI sequence only were performed. Additionally, images are moderately degraded by motion artifact. Cerebral volume within normal limits for age. Mild scattered subcentimeter T2 signal abnormality noted within the periventricular and deep white matter both cerebral hemispheres, most likely related to chronic microvascular ischemic changes, felt to be within normal limits for age. No abnormal foci of restricted diffusion to suggest acute or subacute ischemia. Gray-white matter differentiation maintained. No encephalomalacia to suggest chronic cortical infarction. No foci of susceptibility artifact to suggest acute or chronic intracranial hemorrhage. No mass lesion, midline shift or mass effect. No hydrocephalus. No extra-axial fluid collection. Vascular: Major intracranial vascular flow voids are maintained. Skull and upper cervical spine: Craniocervical junction grossly within normal limits on this limited exam. No focal marrow replacing lesion. Scalp soft tissues  unremarkable. Sinuses/Orbits: Globes and orbital soft tissues demonstrate no acute finding. Patient status post bilateral ocular lens replacement. Paranasal sinuses are clear. Small bilateral mastoid effusions, left greater than right, of doubtful significance. Inner ear structures grossly normal. Other: None. MRA HEAD FINDINGS ANTERIOR CIRCULATION: Examination technically degraded by motion artifact. Distal cervical segments of the internal carotid arteries are patent with symmetric antegrade flow. Petrous segments widely patent. Scattered atheromatous irregularity throughout the cavernous/supraclinoid ICAs without definite hemodynamically significant stenosis. ICA termini well perfused. A1 segments widely patent. Normal anterior communicating artery complex. Anterior cerebral arteries patent to their distal aspects without high-grade stenosis. No M1 stenosis or occlusion. Normal MCA bifurcations. Distal MCA branches well perfused and symmetric. POSTERIOR CIRCULATION: Dominant right vertebral artery patent to the vertebrobasilar junction without stenosis. Left vertebral artery diminutive with scattered atheromatous irregularity without definite high-grade stenosis. Left PICA patent proximally. Right PICA not well seen. Basilar patent to its distal aspect without flow-limiting stenosis. Superior cerebral arteries patent proximally. Both of the posterior cerebral arteries appear to be primarily supplied via the basilar, although a tiny bilateral posterior communicating arteries noted. PCAs patent to their distal aspects without high-grade stenosis. No appreciable aneurysm identified on this motion degraded exam. IMPRESSION: MRI HEAD IMPRESSION: 1. Technically limited examination due to as motion artifact. Inability to tolerate the full length of the study as well 2. No acute intracranial infarct or other abnormality. 3. Mild chronic microvascular ischemic disease for age. MRA HEAD IMPRESSION: 1. Technically limited  exam due to motion artifact. 2. Negative intracranial MRA for large  vessel occlusion. No definite hemodynamically significant or correctable stenosis. Electronically Signed   By: Jeannine Boga M.D.   On: 05/27/2019 21:12   Mr Brain Wo Contrast  Result Date: 05/27/2019 CLINICAL DATA:  Initial evaluation for acute right-sided numbness and weakness, vertigo, unsteady. EXAM: MRI HEAD WITHOUT CONTRAST MRA HEAD WITHOUT CONTRAST TECHNIQUE: Multiplanar, multiecho pulse sequences of the brain and surrounding structures were obtained without intravenous contrast. Angiographic images of the head were obtained using MRA technique without contrast. COMPARISON:  Prior CT from earlier the same day. FINDINGS: MRI HEAD FINDINGS Brain: Examination technically limited as the patient was unable to tolerate the full length of the exam. Axial coronal DWI sequence, axial T2 and FLAIR sequences, and axial SWI sequence only were performed. Additionally, images are moderately degraded by motion artifact. Cerebral volume within normal limits for age. Mild scattered subcentimeter T2 signal abnormality noted within the periventricular and deep white matter both cerebral hemispheres, most likely related to chronic microvascular ischemic changes, felt to be within normal limits for age. No abnormal foci of restricted diffusion to suggest acute or subacute ischemia. Gray-white matter differentiation maintained. No encephalomalacia to suggest chronic cortical infarction. No foci of susceptibility artifact to suggest acute or chronic intracranial hemorrhage. No mass lesion, midline shift or mass effect. No hydrocephalus. No extra-axial fluid collection. Vascular: Major intracranial vascular flow voids are maintained. Skull and upper cervical spine: Craniocervical junction grossly within normal limits on this limited exam. No focal marrow replacing lesion. Scalp soft tissues unremarkable. Sinuses/Orbits: Globes and orbital soft tissues  demonstrate no acute finding. Patient status post bilateral ocular lens replacement. Paranasal sinuses are clear. Small bilateral mastoid effusions, left greater than right, of doubtful significance. Inner ear structures grossly normal. Other: None. MRA HEAD FINDINGS ANTERIOR CIRCULATION: Examination technically degraded by motion artifact. Distal cervical segments of the internal carotid arteries are patent with symmetric antegrade flow. Petrous segments widely patent. Scattered atheromatous irregularity throughout the cavernous/supraclinoid ICAs without definite hemodynamically significant stenosis. ICA termini well perfused. A1 segments widely patent. Normal anterior communicating artery complex. Anterior cerebral arteries patent to their distal aspects without high-grade stenosis. No M1 stenosis or occlusion. Normal MCA bifurcations. Distal MCA branches well perfused and symmetric. POSTERIOR CIRCULATION: Dominant right vertebral artery patent to the vertebrobasilar junction without stenosis. Left vertebral artery diminutive with scattered atheromatous irregularity without definite high-grade stenosis. Left PICA patent proximally. Right PICA not well seen. Basilar patent to its distal aspect without flow-limiting stenosis. Superior cerebral arteries patent proximally. Both of the posterior cerebral arteries appear to be primarily supplied via the basilar, although a tiny bilateral posterior communicating arteries noted. PCAs patent to their distal aspects without high-grade stenosis. No appreciable aneurysm identified on this motion degraded exam. IMPRESSION: MRI HEAD IMPRESSION: 1. Technically limited examination due to as motion artifact. Inability to tolerate the full length of the study as well 2. No acute intracranial infarct or other abnormality. 3. Mild chronic microvascular ischemic disease for age. MRA HEAD IMPRESSION: 1. Technically limited exam due to motion artifact. 2. Negative intracranial MRA for  large vessel occlusion. No definite hemodynamically significant or correctable stenosis. Electronically Signed   By: Jeannine Boga M.D.   On: 05/27/2019 21:12     Scheduled Meds:  aspirin EC  81 mg Oral Daily   atorvastatin  20 mg Oral q1800   insulin aspart  0-5 Units Subcutaneous QHS   insulin aspart  0-9 Units Subcutaneous TID WC   pantoprazole  40 mg Oral Daily   Continuous Infusions:  LOS: 0 days   Time Spent in minutes   30 minutes  Tellis Spivak D.O. on 05/28/2019 at 2:13 PM  Between 7am to 7pm - Please see pager noted on [email or web address]  After 7pm go to [email or web address]  And look for the night coverage person covering for me after hours  [facility] Office  [phone]

## 2019-05-28 NOTE — Progress Notes (Signed)
Physical Therapy Treatment Patient Details Name: Natalie Bond MRN: [id number] DOB: 1956/07/11 Today's Date: 05/28/2019    History of Present Illness Patient began having intial onset of numbness in right arm and decresed coordination on 05/23/2019. The symptoms were intermittent. She felt like over the weekend they increased. At this time she is having decreased sensation in right UE and reports decreased coorsintation in her leg. Her     PT Comments      Patient given HEP for quad set, SLR, LAQ , hip abduction, standing hip 3 way, standing march, balance: tandem stance, narrow base eyes open and closed, narrow base with head turns. Explained each exercise and how to perfrom safely. Patient has a left hip replacement. Advised not to go to end ranges with exercises just until the muslce engages. Also reviewed safety with husband for balance exercises. Patient given bands to advance sitting exercises.   Follow Up Recommendations  Home health PT;Outpatient PT     Equipment Recommendations  None recommended by PT    Recommendations for Other Services       Precautions / Restrictions Restrictions Weight Bearing Restrictions: No    Mobility  Bed Mobility                  Transfers                    Ambulation/Gait                 Stairs             Wheelchair Mobility    Modified Rankin (Stroke Patients Only)       Balance                                            Cognition Arousal/Alertness: Awake/alert Behavior During Therapy: WFL for tasks assessed/performed Overall Cognitive Status: Within Functional Limits for tasks assessed                                        Exercises Other Exercises Other Exercises: Patient given HEP for quad set, SLR, LAQ , hip abduction, standing hip 3 way, standing march, balance: tandem stance, narrow base eyes open and closed, narrow base with head turns. Explained  each exercise and how to perfrom safely. Patient has a left hip replacement. Advised not to go to end ranges with exercises just until the muslce engages. Also reviewed safety with husband for balance exercises. Patient given bands to advance sitting exercises.     General Comments        Pertinent Vitals/Pain Pain Assessment: No/denies pain    Home Living                      Prior Function            PT Goals (current goals can now be found in the care plan section) Acute Rehab PT Goals PT Goal Formulation: With patient Time For Goal Achievement: 06/04/19 Potential to Achieve Goals: Good    Frequency    Min 3X/week      PT Plan      Co-evaluation              AM-PAC PT "6 Clicks" Mobility  Outcome Measure  Help needed turning from your back to your side while in a flat bed without using bedrails?: A Little Help needed moving from lying on your back to sitting on the side of a flat bed without using bedrails?: A Little Help needed moving to and from a bed to a chair (including a wheelchair)?: A Little Help needed standing up from a chair using your arms (e.g., wheelchair or bedside chair)?: A Little Help needed to walk in hospital room?: A Little Help needed climbing 3-5 steps with a railing? : A Lot 6 Click Score: 17    End of Session   Activity Tolerance: Patient tolerated treatment well Patient left: with call bell/phone within reach;with family/visitor present Nurse Communication: Mobility status PT Visit Diagnosis: Other abnormalities of gait and mobility (R26.89);Unsteadiness on feet (R26.81)     Time: 3810-1751 PT Time Calculation (min) (ACUTE ONLY): 12 min  Charges:  $Therapeutic Exercise: 8-22 mins                        Carney Living PT DPT  05/28/2019, 4:53 PM

## 2019-05-28 NOTE — Progress Notes (Signed)
OT Cancellation Note  Patient Details Name: Natalie Bond MRN: [id number] DOB: [birth year]   Cancelled Treatment:    Reason Eval/Treat Not Completed: Patient at procedure or test/ unavailable  Richelle Ito, OTR/L  Acute Rehabilitation Services Pager: [phone] Office: [phone] .  05/28/2019, 3:26 PM

## 2019-05-28 NOTE — Progress Notes (Signed)
  Echocardiogram 2D Echocardiogram has been performed.  Natalie Bond L Androw 05/28/2019, 3:02 PM

## 2019-05-28 NOTE — Evaluation (Signed)
Physical Therapy Evaluation Patient Details Name: Natalie Bond MRN: [id number] DOB: 1956/04/23 Today's Date: 05/28/2019   History of Present Illness  Patient is a 63 year old female who began having intial onset of numbness in right arm and decreased coordination on 05/23/2019. The symptoms were intermittent. She felt like over the weekend they increased. At this time she is having decreased sensation in right UE and reports decreased coorsintation in her leg. Her   Clinical Impression  Patient presents with decreased strength and coordination of her right UE and LE. She has decreased sensation in her arm and leg. She has decreased balance in standing. She requires guarding for all mobility. She will have her husband assist her at home. She has 4 steps to get =in her house. She would benefit from home health therapy with a progression to outpatient if her symptoms persist. Acute therapy will continue to work with the patient.     Follow Up Recommendations Home health PT;Outpatient PT    Equipment Recommendations  None recommended by PT    Recommendations for Other Services       Precautions / Restrictions Restrictions Weight Bearing Restrictions: No      Mobility  Bed Mobility Overal bed mobility: Independent             General bed mobility comments: able to sit EOB without ddifficulty   Transfers Overall transfer level: Needs assistance Equipment used: None Transfers: Sit to/from Stand Sit to Stand: Supervision         General transfer comment: supervision for initial  balance   Ambulation/Gait Ambulation/Gait assistance: Min guard Gait Distance (Feet): 25 Feet Assistive device: None Gait Pattern/deviations: Drifts right/left Gait velocity: decreased   General Gait Details: Patient reports that she felt as though her right leg was "off". She had a slight drift towards that side but had no loss of balance   Stairs            Wheelchair Mobility     Modified Rankin (Stroke Patients Only) Modified Rankin (Stroke Patients Only) Pre-Morbid Rankin Score: No symptoms Modified Rankin: Moderate disability     Balance Overall balance assessment: Needs assistance Sitting-balance support: No upper extremity supported;Feet supported Sitting balance-Leahy Scale: Good     Standing balance support: No upper extremity supported Standing balance-Leahy Scale: Fair Standing balance comment: needed gaurding for balance                High Level Balance Comments: moderate assistance for eyes closed balanace; min a for narrow base and min a for tandem with both feet forward             Pertinent Vitals/Pain Pain Assessment: No/denies pain(had a headache but is better now )    Home Living Family/patient expects to be discharged to:: Private residence Living Arrangements: Spouse/significant other Available Help at Discharge: Family Type of Home: House Home Access: Stairs to enter   Technical brewer of Steps: 4 Home Layout: One level        Prior Function Level of Independence: Independent         Comments: Patient was active prior to onset of sympotms      Hand Dominance   Dominant Hand: Left(but uses right for some fine motor tasks )    Extremity/Trunk Assessment   Upper Extremity Assessment Upper Extremity Assessment: Defer to OT evaluation(right UE grip weakness )    Lower Extremity Assessment Lower Extremity Assessment: RLE deficits/detail RLE Deficits / Details: gross right LE  weakness RLE Sensation: decreased light touch RLE Coordination: decreased gross motor    Cervical / Trunk Assessment Cervical / Trunk Assessment: Normal  Communication   Communication: No difficulties  Cognition Arousal/Alertness: Awake/alert Behavior During Therapy: WFL for tasks assessed/performed Overall Cognitive Status: Within Functional Limits for tasks assessed                                         General Comments General comments (skin integrity, edema, etc.): decreased right eye lateral tracking. Reported increased syncope; mild decrease in right peripheral vision; slow reaction on the right to light touch     Exercises     Assessment/Plan    PT Assessment Patient needs continued PT services  PT Problem List Decreased strength;Decreased activity tolerance;Decreased balance;Decreased mobility;Decreased coordination       PT Treatment Interventions Gait training;DME instruction;Functional mobility training;Therapeutic activities;Therapeutic exercise;Patient/family education;Neuromuscular re-education;Balance training;Stair training    PT Goals (Current goals can be found in the Care Plan section)  Acute Rehab PT Goals Patient Stated Goal: to go home  PT Goal Formulation: With patient Time For Goal Achievement: 06/04/19 Potential to Achieve Goals: Good    Frequency Min 3X/week   Barriers to discharge        Co-evaluation               AM-PAC PT "6 Clicks" Mobility  Outcome Measure Help needed turning from your back to your side while in a flat bed without using bedrails?: A Little Help needed moving from lying on your back to sitting on the side of a flat bed without using bedrails?: A Little Help needed moving to and from a bed to a chair (including a wheelchair)?: A Little Help needed standing up from a chair using your arms (e.g., wheelchair or bedside chair)?: A Little Help needed to walk in hospital room?: A Little Help needed climbing 3-5 steps with a railing? : A Lot 6 Click Score: 17    End of Session Equipment Utilized During Treatment: Gait belt Activity Tolerance: Patient tolerated treatment well Patient left: with call bell/phone within reach;with family/visitor present(with huband on commode. Patient requested to take her time ) Nurse Communication: Mobility status PT Visit Diagnosis: Other abnormalities of gait and mobility  (R26.89);Unsteadiness on feet (R26.81)    Time: 4403-4742 PT Time Calculation (min) (ACUTE ONLY): 24 min   Charges:   PT Evaluation $PT Eval Moderate Complexity: 1 Mod           Carney Living PT DPT  05/28/2019, 10:41 AM

## 2019-05-29 ENCOUNTER — Observation Stay (HOSPITAL_COMMUNITY): Payer: PRIVATE HEALTH INSURANCE

## 2019-05-29 DIAGNOSIS — E041 Nontoxic single thyroid nodule: Secondary | ICD-10-CM | POA: Diagnosis present

## 2019-05-29 DIAGNOSIS — G43109 Migraine with aura, not intractable, without status migrainosus: Secondary | ICD-10-CM | POA: Diagnosis not present

## 2019-05-29 DIAGNOSIS — M4310 Spondylolisthesis, site unspecified: Secondary | ICD-10-CM

## 2019-05-29 DIAGNOSIS — I10 Essential (primary) hypertension: Secondary | ICD-10-CM | POA: Diagnosis not present

## 2019-05-29 DIAGNOSIS — G459 Transient cerebral ischemic attack, unspecified: Secondary | ICD-10-CM | POA: Diagnosis present

## 2019-05-29 LAB — GLUCOSE, CAPILLARY
Glucose-Capillary: 91 mg/dL (ref 70–99)
Glucose-Capillary: 127 mg/dL — ABNORMAL HIGH (ref 70–99)

## 2019-05-29 MED ORDER — ATORVASTATIN CALCIUM 20 MG PO TABS: 20.0000 mg | ORAL_TABLET | Freq: Every day | ORAL | 1 refills | Status: DC

## 2019-05-29 MED ORDER — BUTALBITAL-APAP-CAFFEINE 50-325-40 MG PO TABS: 1.0000 | ORAL_TABLET | Freq: Two times a day (BID) | ORAL | 0 refills | Status: AC | PRN

## 2019-05-29 NOTE — Progress Notes (Signed)
STROKE TEAM PROGRESS NOTE   INTERVAL HISTORY Pt sitting in bed, stated that her right side numbness getting better but not resolved. MRI showed no spinal cord compression but cervical radiculopathy. Will refer to [place] for management. Neck movement education provided.   Vitals:   05/29/19 0019 05/29/19 0502 05/29/19 0809 05/29/19 1215  BP: 125/82 136/83 140/77 (!) 143/85  Pulse: 76 62 70 82  Resp: 18 16 18 18   Temp: 98.2 F (36.8 C) 97.9 F (36.6 C) 98.3 F (36.8 C) 98.3 F (36.8 C)  TempSrc:  Oral Oral Oral  SpO2: 97% 98%  97%  Weight:      Height:        CBC:  Recent Labs  Lab 05/27/19 1040 05/27/19 1052  WBC 8.4  --   NEUTROABS 4.8  --   HGB 13.3 13.6  HCT 40.2 40.0  MCV 93.9  --   PLT 288  --     Basic Metabolic Panel:  Recent Labs  Lab 05/27/19 1040 05/27/19 1052  NA 140 138  K 3.8 3.6  CL 105 104  CO2 24  --   GLUCOSE 109* 106*  BUN 17 18  CREATININE 1.03* 1.00  CALCIUM 9.2  --    Lipid Panel:     Component Value Date/Time   CHOL 217 (H) 05/28/2019 0113   TRIG 383 (H) 05/28/2019 0113   HDL 42 05/28/2019 0113   CHOLHDL 5.2 05/28/2019 0113   VLDL 77 (H) 05/28/2019 0113   LDLCALC 98 05/28/2019 0113   HgbA1c:  Lab Results  Component Value Date   HGBA1C 6.1 (H) 05/28/2019   Urine Drug Screen: No results found for: LABOPIA, COCAINSCRNUR, LABBENZ, AMPHETMU, THCU, LABBARB  Alcohol Level No results found for: ETH  IMAGING Ct Angio Head W Or Wo Contrast  Result Date: 05/27/2019 CLINICAL DATA:  63 y/o  F; Stroke, follow up right arm weakness. EXAM: CT ANGIOGRAPHY HEAD AND NECK TECHNIQUE: Multidetector CT imaging of the head and neck was performed using the standard protocol during bolus administration of intravenous contrast. Multiplanar CT image reconstructions and MIPs were obtained to evaluate the vascular anatomy. Carotid stenosis measurements (when applicable) are obtained utilizing NASCET criteria, using the distal internal carotid diameter as the  denominator. CONTRAST:  165mL OMNIPAQUE IOHEXOL 350 MG/ML SOLN COMPARISON:  05/27/2019 CT head, MRA head, and MRI of the head. FINDINGS: CTA NECK FINDINGS Aortic arch: Standard branching. Imaged portion shows no evidence of aneurysm or dissection. No significant stenosis of the major arch vessel origins. Right carotid system: No evidence of dissection, stenosis (50% or greater) or occlusion. Left carotid system: No evidence of dissection, stenosis (50% or greater) or occlusion. Vertebral arteries: Codominant. No evidence of dissection, stenosis (50% or greater) or occlusion. Skeleton: Spondylosis of the cervical spine with predominant right greater than left facet arthropathy. No acute osseous abnormality. Other neck: 16 mm nodule in right lobe of thyroid. Upper chest: Negative. Review of the MIP images confirms the above findings CTA HEAD FINDINGS Anterior circulation: No significant stenosis, proximal occlusion, aneurysm, or vascular malformation. Posterior circulation: No significant stenosis, proximal occlusion, aneurysm, or vascular malformation. Venous sinuses: As permitted by contrast timing, patent. Anatomic variants: Small anterior and posterior communicating arteries. Review of the MIP images confirms the above findings IMPRESSION: 1. Patent carotid and vertebral arteries. No dissection, aneurysm, or hemodynamically significant stenosis utilizing NASCET criteria. 2. Patent anterior and posterior intracranial circulation. No large vessel occlusion, aneurysm, or significant stenosis. 3. 16 mm nodule in right  lobe of thyroid. Thyroid ultrasound is recommended on a nonemergent basis. Electronically Signed   By: Kristine Garbe M.D.   On: 05/27/2019 23:52   Dg Chest 2 View  Result Date: 05/27/2019 CLINICAL DATA:  Right arm weakness EXAM: CHEST - 2 VIEW COMPARISON:  01/01/2014 FINDINGS: Heart and mediastinal contours are within normal limits. No focal opacities or effusions. No acute bony  abnormality. IMPRESSION: No active cardiopulmonary disease. Electronically Signed   By: Rolm Baptise M.D.   On: 05/27/2019 23:08   Ct Angio Neck W Or Wo Contrast  Result Date: 05/27/2019 CLINICAL DATA:  63 y/o  F; Stroke, follow up right arm weakness. EXAM: CT ANGIOGRAPHY HEAD AND NECK TECHNIQUE: Multidetector CT imaging of the head and neck was performed using the standard protocol during bolus administration of intravenous contrast. Multiplanar CT image reconstructions and MIPs were obtained to evaluate the vascular anatomy. Carotid stenosis measurements (when applicable) are obtained utilizing NASCET criteria, using the distal internal carotid diameter as the denominator. CONTRAST:  175mL OMNIPAQUE IOHEXOL 350 MG/ML SOLN COMPARISON:  05/27/2019 CT head, MRA head, and MRI of the head. FINDINGS: CTA NECK FINDINGS Aortic arch: Standard branching. Imaged portion shows no evidence of aneurysm or dissection. No significant stenosis of the major arch vessel origins. Right carotid system: No evidence of dissection, stenosis (50% or greater) or occlusion. Left carotid system: No evidence of dissection, stenosis (50% or greater) or occlusion. Vertebral arteries: Codominant. No evidence of dissection, stenosis (50% or greater) or occlusion. Skeleton: Spondylosis of the cervical spine with predominant right greater than left facet arthropathy. No acute osseous abnormality. Other neck: 16 mm nodule in right lobe of thyroid. Upper chest: Negative. Review of the MIP images confirms the above findings CTA HEAD FINDINGS Anterior circulation: No significant stenosis, proximal occlusion, aneurysm, or vascular malformation. Posterior circulation: No significant stenosis, proximal occlusion, aneurysm, or vascular malformation. Venous sinuses: As permitted by contrast timing, patent. Anatomic variants: Small anterior and posterior communicating arteries. Review of the MIP images confirms the above findings IMPRESSION: 1. Patent  carotid and vertebral arteries. No dissection, aneurysm, or hemodynamically significant stenosis utilizing NASCET criteria. 2. Patent anterior and posterior intracranial circulation. No large vessel occlusion, aneurysm, or significant stenosis. 3. 16 mm nodule in right lobe of thyroid. Thyroid ultrasound is recommended on a nonemergent basis. Electronically Signed   By: Kristine Garbe M.D.   On: 05/27/2019 23:52   Mr Angio Head Wo Contrast  Result Date: 05/27/2019 CLINICAL DATA:  Initial evaluation for acute right-sided numbness and weakness, vertigo, unsteady. EXAM: MRI HEAD WITHOUT CONTRAST MRA HEAD WITHOUT CONTRAST TECHNIQUE: Multiplanar, multiecho pulse sequences of the brain and surrounding structures were obtained without intravenous contrast. Angiographic images of the head were obtained using MRA technique without contrast. COMPARISON:  Prior CT from earlier the same day. FINDINGS: MRI HEAD FINDINGS Brain: Examination technically limited as the patient was unable to tolerate the full length of the exam. Axial coronal DWI sequence, axial T2 and FLAIR sequences, and axial SWI sequence only were performed. Additionally, images are moderately degraded by motion artifact. Cerebral volume within normal limits for age. Mild scattered subcentimeter T2 signal abnormality noted within the periventricular and deep white matter both cerebral hemispheres, most likely related to chronic microvascular ischemic changes, felt to be within normal limits for age. No abnormal foci of restricted diffusion to suggest acute or subacute ischemia. Gray-white matter differentiation maintained. No encephalomalacia to suggest chronic cortical infarction. No foci of susceptibility artifact to suggest acute or chronic intracranial  hemorrhage. No mass lesion, midline shift or mass effect. No hydrocephalus. No extra-axial fluid collection. Vascular: Major intracranial vascular flow voids are maintained. Skull and upper  cervical spine: Craniocervical junction grossly within normal limits on this limited exam. No focal marrow replacing lesion. Scalp soft tissues unremarkable. Sinuses/Orbits: Globes and orbital soft tissues demonstrate no acute finding. Patient status post bilateral ocular lens replacement. Paranasal sinuses are clear. Small bilateral mastoid effusions, left greater than right, of doubtful significance. Inner ear structures grossly normal. Other: None. MRA HEAD FINDINGS ANTERIOR CIRCULATION: Examination technically degraded by motion artifact. Distal cervical segments of the internal carotid arteries are patent with symmetric antegrade flow. Petrous segments widely patent. Scattered atheromatous irregularity throughout the cavernous/supraclinoid ICAs without definite hemodynamically significant stenosis. ICA termini well perfused. A1 segments widely patent. Normal anterior communicating artery complex. Anterior cerebral arteries patent to their distal aspects without high-grade stenosis. No M1 stenosis or occlusion. Normal MCA bifurcations. Distal MCA branches well perfused and symmetric. POSTERIOR CIRCULATION: Dominant right vertebral artery patent to the vertebrobasilar junction without stenosis. Left vertebral artery diminutive with scattered atheromatous irregularity without definite high-grade stenosis. Left PICA patent proximally. Right PICA not well seen. Basilar patent to its distal aspect without flow-limiting stenosis. Superior cerebral arteries patent proximally. Both of the posterior cerebral arteries appear to be primarily supplied via the basilar, although a tiny bilateral posterior communicating arteries noted. PCAs patent to their distal aspects without high-grade stenosis. No appreciable aneurysm identified on this motion degraded exam. IMPRESSION: MRI HEAD IMPRESSION: 1. Technically limited examination due to as motion artifact. Inability to tolerate the full length of the study as well 2. No acute  intracranial infarct or other abnormality. 3. Mild chronic microvascular ischemic disease for age. MRA HEAD IMPRESSION: 1. Technically limited exam due to motion artifact. 2. Negative intracranial MRA for large vessel occlusion. No definite hemodynamically significant or correctable stenosis. Electronically Signed   By: Jeannine Boga M.D.   On: 05/27/2019 21:12   Mr Brain Wo Contrast  Result Date: 05/27/2019 CLINICAL DATA:  Initial evaluation for acute right-sided numbness and weakness, vertigo, unsteady. EXAM: MRI HEAD WITHOUT CONTRAST MRA HEAD WITHOUT CONTRAST TECHNIQUE: Multiplanar, multiecho pulse sequences of the brain and surrounding structures were obtained without intravenous contrast. Angiographic images of the head were obtained using MRA technique without contrast. COMPARISON:  Prior CT from earlier the same day. FINDINGS: MRI HEAD FINDINGS Brain: Examination technically limited as the patient was unable to tolerate the full length of the exam. Axial coronal DWI sequence, axial T2 and FLAIR sequences, and axial SWI sequence only were performed. Additionally, images are moderately degraded by motion artifact. Cerebral volume within normal limits for age. Mild scattered subcentimeter T2 signal abnormality noted within the periventricular and deep white matter both cerebral hemispheres, most likely related to chronic microvascular ischemic changes, felt to be within normal limits for age. No abnormal foci of restricted diffusion to suggest acute or subacute ischemia. Gray-white matter differentiation maintained. No encephalomalacia to suggest chronic cortical infarction. No foci of susceptibility artifact to suggest acute or chronic intracranial hemorrhage. No mass lesion, midline shift or mass effect. No hydrocephalus. No extra-axial fluid collection. Vascular: Major intracranial vascular flow voids are maintained. Skull and upper cervical spine: Craniocervical junction grossly within normal  limits on this limited exam. No focal marrow replacing lesion. Scalp soft tissues unremarkable. Sinuses/Orbits: Globes and orbital soft tissues demonstrate no acute finding. Patient status post bilateral ocular lens replacement. Paranasal sinuses are clear. Small bilateral mastoid effusions, left greater than  right, of doubtful significance. Inner ear structures grossly normal. Other: None. MRA HEAD FINDINGS ANTERIOR CIRCULATION: Examination technically degraded by motion artifact. Distal cervical segments of the internal carotid arteries are patent with symmetric antegrade flow. Petrous segments widely patent. Scattered atheromatous irregularity throughout the cavernous/supraclinoid ICAs without definite hemodynamically significant stenosis. ICA termini well perfused. A1 segments widely patent. Normal anterior communicating artery complex. Anterior cerebral arteries patent to their distal aspects without high-grade stenosis. No M1 stenosis or occlusion. Normal MCA bifurcations. Distal MCA branches well perfused and symmetric. POSTERIOR CIRCULATION: Dominant right vertebral artery patent to the vertebrobasilar junction without stenosis. Left vertebral artery diminutive with scattered atheromatous irregularity without definite high-grade stenosis. Left PICA patent proximally. Right PICA not well seen. Basilar patent to its distal aspect without flow-limiting stenosis. Superior cerebral arteries patent proximally. Both of the posterior cerebral arteries appear to be primarily supplied via the basilar, although a tiny bilateral posterior communicating arteries noted. PCAs patent to their distal aspects without high-grade stenosis. No appreciable aneurysm identified on this motion degraded exam. IMPRESSION: MRI HEAD IMPRESSION: 1. Technically limited examination due to as motion artifact. Inability to tolerate the full length of the study as well 2. No acute intracranial infarct or other abnormality. 3. Mild chronic  microvascular ischemic disease for age. MRA HEAD IMPRESSION: 1. Technically limited exam due to motion artifact. 2. Negative intracranial MRA for large vessel occlusion. No definite hemodynamically significant or correctable stenosis. Electronically Signed   By: Jeannine Boga M.D.   On: 05/27/2019 21:12   Mr Cervical Spine Wo Contrast  Result Date: 05/29/2019 CLINICAL DATA:  Chronic neck pain EXAM: MRI CERVICAL SPINE WITHOUT CONTRAST TECHNIQUE: Multiplanar, multisequence MR imaging of the cervical spine was performed. No intravenous contrast was administered. COMPARISON:  CTA head neck from 2 days ago. Cervical MRI May 16, 2014 FINDINGS: Alignment: 2 mm of anterolisthesis at C5-6, facet mediated Vertebrae: No fracture, evidence of discitis, or bone lesion. Cord: Normal signal and morphology. Posterior Fossa, vertebral arteries, paraspinal tissues: Negative Disc levels: C2-3: Moderate degenerative right foraminal spurring with foraminal impingement. C3-4: Degenerative facet spurring asymmetric to the right. Small central protrusion based on axial slices. No impingement C4-5: Degenerative facet spurring asymmetric to the left. No impingement. C5-6: Degenerative facet spurring that is bulky on the left, with mild anterolisthesis. Moderate left foraminal narrowing C6-7: Degenerative facet spurring on both sides. No evident impingement C7-T1:Unremarkable. IMPRESSION: 1. Asymmetric, multilevel degenerative facet spurring with C5-6 mild anterolisthesis. 2. Facet spurs encroach on the foramina on the right at C2-3 and left C5-6. Electronically Signed   By: Monte Fantasia M.D.   On: 05/29/2019 09:00    PHYSICAL EXAM  Temp:  [97.9 F (36.6 C)-98.3 F (36.8 C)] 98.3 F (36.8 C) (07/01 1215) Pulse Rate:  [62-84] 82 (07/01 1215) Resp:  [16-18] 18 (07/01 1215) BP: (125-146)/(77-88) 143/85 (07/01 1215) SpO2:  [97 %-98 %] 97 % (07/01 1215)  General - Well nourished, well developed, in no apparent  distress.  Ophthalmologic - fundi not visualized due to noncooperation.  Cardiovascular - Regular rate and rhythm.  Mental Status -  Level of arousal and orientation to time, place, and person were intact. Language including expression, naming, repetition, comprehension was assessed and found intact.  Cranial Nerves II - XII - II - Visual field intact OU. III, IV, VI - Extraocular movements intact. V - Facial sensation decreased on the right, 60% of the left. VII - Facial movement intact bilaterally. VIII - Hearing & vestibular intact bilaterally. X -  Palate elevates symmetrically. XI - Chin turning & shoulder shrug intact bilaterally. XII - Tongue protrusion intact.  Motor Strength - The patient's strength was normal in all extremities and pronator drift was absent.  Bulk was normal and fasciculations were absent.   Motor Tone - Muscle tone was assessed at the neck and appendages and was normal.  Reflexes - The patient's reflexes were symmetrical in all extremities and she had no pathological reflexes.  Sensory - Light touch, temperature/pinprick were assessed and were decreased on the right, 60% of the left  Coordination - The patient had normal movements in the hands with no ataxia or dysmetria.  Tremor was absent.  Gait and Station - deferred.   ASSESSMENT/PLAN Natalie Bond is a 63 y.o. female with history of HTN, HLD, DB, GERD, TIA presenting with R sided weakness/numbness arm and leg.   R sided weakness/numbness - Complicated migraine vs cervical radiculopathy  CT head negative   MRI  No acute infarct. Small vessel disease.   MRA  No LVO  CTA head & neck no LVO. No significant stenosis. R thyroid nodule; [place] recommended.  MRI C-spine asymmetric multilevel degenerative facet spurring C5-6, facet spurs encroaching on foramina are C2-3 and L C5-6  2D Echo EF 60 to 65%  LDL 98  HgbA1c 6.1  SCDs for VTE prophylaxis  aspirin 81 mg daily prior to admission,  now on aspirin 81 mg daily. Continue at d/c  Therapy recommendations:  HH PT (has no insurance coverage for home health or outpatient therapies)  Disposition:  Return home   Migraines  Patient has history of migraines with headache and nausea vomiting  Current presentation is not typical migraine  However, it could be complicated migraine without headache triggered by neck pain  Continue Topamax nightly for migraine prevention  Cervical radiculopathy  MRI C-spine asymmetric multilevel degenerative facet spurring C5-6, facet spurs encroaching on foramina are C2-3 and L C5-6  Will refer to neurosurgery for follow-up and management  Hypertension  Stable . BP goal normotensive  Hyperlipidemia  Home meds:  No statin  Now on lipitor 20 given history of diabetes  LDL 98  Continue statin at discharge  Diabetes type II Controlled Diabetic Neuropathy  HgbA1c 6.1, at goal < 7.0  SSI  CBG monitoring  PCP follow-up  Other Stroke Risk Factors  Overweight, Body mass index is 28.1 kg/m., recommend weight loss, diet and exercise as appropriate   Obstructive sleep apnea, not on CPAP at home  Other Active Problems  Chronic back pain and neck pain  GERD  Thyroid nodule, [place] recommended  Hospital day # 0   Neurology will sign off. Please call with questions. Pt will follow up with neurosurgery in about 4 weeks.  Referral ordered.  Thanks for the consult.  Rosalin Hawking, MD PhD Stroke Neurology 05/29/2019 2:42 PM    To contact Stroke Continuity provider, please refer to [email or web address] After hours, contact General Neurology

## 2019-05-29 NOTE — TOC Transition Note (Signed)
Transition of Care [facility]) - CM/SW Discharge Note   Patient Details  Name: Natalie Bond MRN: [id number] Date of Birth: 1956/04/14  [facility] CM/SW Contact:  Pollie Friar, RN Phone Number: 05/29/2019, 2:22 PM   Clinical Narrative:    Pt discharging home with self care. Pts insurance will not provide any HH or outpatient services. Pt and MD are aware.  Pt has transportation home.   Final next level of care: Home/Self Care Barriers to Discharge: No Barriers Identified   Patient Goals and CMS Choice Patient states their goals for this hospitalization and ongoing recovery are:: get back home, walk better      Discharge Placement                       Discharge Plan and Services                                     Social Determinants of Health (SDOH) Interventions     Readmission Risk Interventions No flowsheet data found.

## 2019-05-29 NOTE — Discharge Summary (Signed)
Physician Discharge Summary  Natalie Bond YIR:[id number] DOB: 03/18/1956 DOA: 05/27/2019  PCP: Leanna Battles, MD  Admit date: 05/27/2019 Discharge date: 05/29/2019  Time spent: 45 minutes  Recommendations for Outpatient Follow-up:  Follow up with PCP 2-3 weeks for evaluation of symptoms. Migraine management. Consider OP US thyroid. Consider referral to neurosurgery for EMG  Discharge Diagnoses:  Principal Problem:   TIA (transient ischemic attack) Active Problems:   Essential hypertension   Right arm weakness   Type 2 diabetes mellitus without complication (HCC)   Anterolisthesis   Thyroid nodule   Discharge Condition: stable  Diet recommendation: heart healthy/carb modifie  Filed Weights   05/27/19 1019 05/28/19 0102  Weight: 81.6 kg 81.4 kg    History of present illness:    Natalie Bond  is a 63 y.o. female,  w hypertension, hyperlipidemia, dm2, OSA , gerd, h/o TIA , presented 6/29 with c/o right upper extremity weakness starting on day prior while washing a fish tank.  Pt noted numbness, vertigo, unsteady gait, and leaning to the right.  Pt stated that she required her husband to ambulate and that she was improving over the weekend but then her symptoms worsened  and therefore she presented to ED for evaluation.  Pt also noted slight right sided facial numbness.  Pt noted headache on Tuesday or Wednesday.  Pt denied cp, palp, sob, n/v, abd pain, diarrhea, brbpr, [place] stool, dysuria.   Hospital Course:   Acute Right sided weakness/numbness, TIA vs complicated migraine? -CT head negative -CTA head and neck:Patent carotid and vertebral arteries. No large vessel occlusion. -MRI/MRAhead:Technically Limited exam due to motion artifact. No acute intracranial infarct or other abnormality. Negative intracranial MRA for large vessel occlusion. -LDL 98, Hemoglobin 6.1 -Discharge on aspirin and statin. -Echocardiogram EF 60% -MRI c spine with Asymmetric, multilevel  degenerative facet spurring with C5-6 mild anterolisthesis. Facet spurs encroach on the foramina on the right at C2-3 and left C5-6. -Neurology consulted and recommending asa, statin and topamax for migraine.   Migraine headache -Continue Topamax  Diabetes mellitus, type II -Hemoglobin A1c 6.1  Diabetic neuropathy -Continue gabapentin  Essential hypertension controlled -continue metoprolol and clonidine  Chronic back pain -Continue home meds  GERD -Continue PPI  Thyroid nodule -Incidental finding -CT head and neck showed 45mm nodule in right lobe of thyroid. Thyroid ultrasound is recommended on a nonemergent basis  Hyperlipidemia -lipid panel total cholesterol 217, HDL 42, LDL 98, triglycerides 383  Procedures:  Echo   Consultations:  [facility] neurology  Discharge Exam: Vitals:   05/29/19 0809 05/29/19 1215  BP: 140/77 (!) 143/85  Pulse: 70 82  Resp: 18 18  Temp: 98.3 F (36.8 C) 98.3 F (36.8 C)  SpO2:  97%    General: ambulating in hall with steady gait Cardiovascular: rrr no mgr no LE edema Respiratory: normal effort BS clear bilaterally no wheeze  Discharge Instructions    Allergies as of 05/29/2019      Reactions   Dilaudid [hydromorphone Hcl] Anaphylaxis   [name]    Penicillins Anaphylaxis, Rash   Has patient had a PCN reaction causing immediate rash, facial/tongue/throat swelling, SOB or lightheadedness with hypotension: Yes Has patient had a PCN reaction causing severe rash involving mucus membranes or skin necrosis: No Has patient had a PCN reaction that required hospitalization Yes Has patient had a PCN reaction occurring within the last 10 years: No If all of the above answers are "NO", then may proceed with Cephalosporin use.  Medication List    STOP taking these medications   baclofen 10 MG tablet Commonly known as: LIORESAL   diclofenac sodium 1 % Gel Commonly known as: Voltaren    HYDROcodone-acetaminophen 5-325 MG tablet Commonly known as: NORCO/VICODIN   meloxicam 7.5 MG tablet Commonly known as: Mobic     TAKE these medications   amitriptyline 25 MG tablet Commonly known as: ELAVIL Take 1 tablet (25 mg total) by mouth at bedtime. What changed: Another medication with the same name was removed. Continue taking this medication, and follow the directions you see here.   aspirin 81 MG chewable tablet Chew 1 tablet (81 mg total) by mouth 2 (two) times daily.   atorvastatin 20 MG tablet Commonly known as: LIPITOR Take 1 tablet (20 mg total) by mouth daily at 6 PM.   butalbital-acetaminophen-caffeine 50-325-40 MG tablet Commonly known as: FIORICET Take 1 tablet by mouth every 12 (twelve) hours as needed for headache.   cloNIDine 0.1 MG tablet Commonly known as: CATAPRES Take 0.1 mg by mouth 2 (two) times daily as needed (Blood pressure is over 160). If BP is over 160   cyclobenzaprine 10 MG tablet Commonly known as: FLEXERIL Take 10 mg by mouth 3 (three) times daily as needed for muscle spasms.   gabapentin 100 MG capsule Commonly known as: NEURONTIN Take 1 capsule (100 mg total) by mouth at bedtime.   ibuprofen 800 MG tablet Commonly known as: ADVIL Take 1 tablet (800 mg total) by mouth every 8 (eight) hours as needed.   magnesium 30 MG tablet Take 30 mg by mouth 2 (two) times daily.   metFORMIN 500 MG 24 hr tablet Commonly known as: GLUCOPHAGE-XR Take 500 mg by mouth daily.   omeprazole 20 MG capsule Commonly known as: PRILOSEC Take 20 mg by mouth daily.   ondansetron 4 MG tablet Commonly known as: ZOFRAN Take 1-2 tablets (4-8 mg total) by mouth every 8 (eight) hours as needed for nausea or vomiting. What changed: Another medication with the same name was removed. Continue taking this medication, and follow the directions you see here.   Potassium 75 MG Tabs Take 75 mg by mouth daily.   telmisartan-hydrochlorothiazide 80-12.5 MG  tablet Commonly known as: MICARDIS HCT Take 1 tablet by mouth daily.   topiramate 25 MG tablet Commonly known as: TOPAMAX Take 25 mg by mouth at bedtime.   traMADol 50 MG tablet Commonly known as: ULTRAM Take 1 tablet (50 mg total) by mouth 3 (three) times daily as needed. What changed:   reasons to take this  Another medication with the same name was removed. Continue taking this medication, and follow the directions you see here.      Allergies  Allergen Reactions  . Dilaudid [Hydromorphone Hcl] Anaphylaxis    [name]   . Penicillins Anaphylaxis and Rash    Has patient had a PCN reaction causing immediate rash, facial/tongue/throat swelling, SOB or lightheadedness with hypotension: Yes Has patient had a PCN reaction causing severe rash involving mucus membranes or skin necrosis: No Has patient had a PCN reaction that required hospitalization Yes Has patient had a PCN reaction occurring within the last 10 years: No If all of the above answers are "NO", then may proceed with Cephalosporin use.       The results of significant diagnostics from this hospitalization (including imaging, microbiology, ancillary and laboratory) are listed below for reference.    Significant Diagnostic Studies: Ct Angio Head W Or Wo Contrast  Result Date: 05/27/2019 CLINICAL DATA:  63 y/o  F; Stroke, follow up right arm weakness. EXAM: CT ANGIOGRAPHY HEAD AND NECK TECHNIQUE: Multidetector CT imaging of the head and neck was performed using the standard protocol during bolus administration of intravenous contrast. Multiplanar CT image reconstructions and MIPs were obtained to evaluate the vascular anatomy. Carotid stenosis measurements (when applicable) are obtained utilizing NASCET criteria, using the distal internal carotid diameter as the denominator. CONTRAST:  169mL OMNIPAQUE IOHEXOL 350 MG/ML SOLN COMPARISON:  05/27/2019 CT head, MRA head, and MRI of the head. FINDINGS: CTA  NECK FINDINGS Aortic arch: Standard branching. Imaged portion shows no evidence of aneurysm or dissection. No significant stenosis of the major arch vessel origins. Right carotid system: No evidence of dissection, stenosis (50% or greater) or occlusion. Left carotid system: No evidence of dissection, stenosis (50% or greater) or occlusion. Vertebral arteries: Codominant. No evidence of dissection, stenosis (50% or greater) or occlusion. Skeleton: Spondylosis of the cervical spine with predominant right greater than left facet arthropathy. No acute osseous abnormality. Other neck: 16 mm nodule in right lobe of thyroid. Upper chest: Negative. Review of the MIP images confirms the above findings CTA HEAD FINDINGS Anterior circulation: No significant stenosis, proximal occlusion, aneurysm, or vascular malformation. Posterior circulation: No significant stenosis, proximal occlusion, aneurysm, or vascular malformation. Venous sinuses: As permitted by contrast timing, patent. Anatomic variants: Small anterior and posterior communicating arteries. Review of the MIP images confirms the above findings IMPRESSION: 1. Patent carotid and vertebral arteries. No dissection, aneurysm, or hemodynamically significant stenosis utilizing NASCET criteria. 2. Patent anterior and posterior intracranial circulation. No large vessel occlusion, aneurysm, or significant stenosis. 3. 16 mm nodule in right lobe of thyroid. Thyroid ultrasound is recommended on a nonemergent basis. Electronically Signed   By: Kristine Garbe M.D.   On: 05/27/2019 23:52   Dg Chest 2 View  Result Date: 05/27/2019 CLINICAL DATA:  Right arm weakness EXAM: CHEST - 2 VIEW COMPARISON:  01/01/2014 FINDINGS: Heart and mediastinal contours are within normal limits. No focal opacities or effusions. No acute bony abnormality. IMPRESSION: No active cardiopulmonary disease. Electronically Signed   By: Rolm Baptise M.D.   On: 05/27/2019 23:08   Ct Head Wo  Contrast  Result Date: 05/27/2019 CLINICAL DATA:  Possible stroke. Right-sided headache with blurred vision. EXAM: CT HEAD WITHOUT CONTRAST TECHNIQUE: Contiguous axial images were obtained from the base of the skull through the vertex without intravenous contrast. COMPARISON:  01/01/2014 FINDINGS: Brain: No evidence of acute infarction, hemorrhage, hydrocephalus, extra-axial collection or mass lesion/mass effect. Vascular: No hyperdense vessel or unexpected calcification. Skull: Normal. Negative for fracture or focal lesion. Sinuses/Orbits: No acute finding.  Bilateral cataract resection IMPRESSION: Negative head CT Electronically Signed   By: Monte Fantasia M.D.   On: 05/27/2019 11:08   Ct Angio Neck W Or Wo Contrast  Result Date: 05/27/2019 CLINICAL DATA:  63 y/o  F; Stroke, follow up right arm weakness. EXAM: CT ANGIOGRAPHY HEAD AND NECK TECHNIQUE: Multidetector CT imaging of the head and neck was performed using the standard protocol during bolus administration of intravenous contrast. Multiplanar CT image reconstructions and MIPs were obtained to evaluate the vascular anatomy. Carotid stenosis measurements (when applicable) are obtained utilizing NASCET criteria, using the distal internal carotid diameter as the denominator. CONTRAST:  195mL OMNIPAQUE IOHEXOL 350 MG/ML SOLN COMPARISON:  05/27/2019 CT head, MRA head, and MRI of the head. FINDINGS: CTA NECK FINDINGS Aortic arch: Standard branching. Imaged portion shows no evidence of aneurysm or dissection.  No significant stenosis of the major arch vessel origins. Right carotid system: No evidence of dissection, stenosis (50% or greater) or occlusion. Left carotid system: No evidence of dissection, stenosis (50% or greater) or occlusion. Vertebral arteries: Codominant. No evidence of dissection, stenosis (50% or greater) or occlusion. Skeleton: Spondylosis of the cervical spine with predominant right greater than left facet arthropathy. No acute osseous  abnormality. Other neck: 16 mm nodule in right lobe of thyroid. Upper chest: Negative. Review of the MIP images confirms the above findings CTA HEAD FINDINGS Anterior circulation: No significant stenosis, proximal occlusion, aneurysm, or vascular malformation. Posterior circulation: No significant stenosis, proximal occlusion, aneurysm, or vascular malformation. Venous sinuses: As permitted by contrast timing, patent. Anatomic variants: Small anterior and posterior communicating arteries. Review of the MIP images confirms the above findings IMPRESSION: 1. Patent carotid and vertebral arteries. No dissection, aneurysm, or hemodynamically significant stenosis utilizing NASCET criteria. 2. Patent anterior and posterior intracranial circulation. No large vessel occlusion, aneurysm, or significant stenosis. 3. 16 mm nodule in right lobe of thyroid. Thyroid ultrasound is recommended on a nonemergent basis. Electronically Signed   By: Kristine Garbe M.D.   On: 05/27/2019 23:52   Mr Angio Head Wo Contrast  Result Date: 05/27/2019 CLINICAL DATA:  Initial evaluation for acute right-sided numbness and weakness, vertigo, unsteady. EXAM: MRI HEAD WITHOUT CONTRAST MRA HEAD WITHOUT CONTRAST TECHNIQUE: Multiplanar, multiecho pulse sequences of the brain and surrounding structures were obtained without intravenous contrast. Angiographic images of the head were obtained using MRA technique without contrast. COMPARISON:  Prior CT from earlier the same day. FINDINGS: MRI HEAD FINDINGS Brain: Examination technically limited as the patient was unable to tolerate the full length of the exam. Axial coronal DWI sequence, axial T2 and FLAIR sequences, and axial SWI sequence only were performed. Additionally, images are moderately degraded by motion artifact. Cerebral volume within normal limits for age. Mild scattered subcentimeter T2 signal abnormality noted within the periventricular and deep white matter both cerebral  hemispheres, most likely related to chronic microvascular ischemic changes, felt to be within normal limits for age. No abnormal foci of restricted diffusion to suggest acute or subacute ischemia. Gray-white matter differentiation maintained. No encephalomalacia to suggest chronic cortical infarction. No foci of susceptibility artifact to suggest acute or chronic intracranial hemorrhage. No mass lesion, midline shift or mass effect. No hydrocephalus. No extra-axial fluid collection. Vascular: Major intracranial vascular flow voids are maintained. Skull and upper cervical spine: Craniocervical junction grossly within normal limits on this limited exam. No focal marrow replacing lesion. Scalp soft tissues unremarkable. Sinuses/Orbits: Globes and orbital soft tissues demonstrate no acute finding. Patient status post bilateral ocular lens replacement. Paranasal sinuses are clear. Small bilateral mastoid effusions, left greater than right, of doubtful significance. Inner ear structures grossly normal. Other: None. MRA HEAD FINDINGS ANTERIOR CIRCULATION: Examination technically degraded by motion artifact. Distal cervical segments of the internal carotid arteries are patent with symmetric antegrade flow. Petrous segments widely patent. Scattered atheromatous irregularity throughout the cavernous/supraclinoid ICAs without definite hemodynamically significant stenosis. ICA termini well perfused. A1 segments widely patent. Normal anterior communicating artery complex. Anterior cerebral arteries patent to their distal aspects without high-grade stenosis. No M1 stenosis or occlusion. Normal MCA bifurcations. Distal MCA branches well perfused and symmetric. POSTERIOR CIRCULATION: Dominant right vertebral artery patent to the vertebrobasilar junction without stenosis. Left vertebral artery diminutive with scattered atheromatous irregularity without definite high-grade stenosis. Left PICA patent proximally. Right PICA not well  seen. Basilar patent to its distal aspect without  flow-limiting stenosis. Superior cerebral arteries patent proximally. Both of the posterior cerebral arteries appear to be primarily supplied via the basilar, although a tiny bilateral posterior communicating arteries noted. PCAs patent to their distal aspects without high-grade stenosis. No appreciable aneurysm identified on this motion degraded exam. IMPRESSION: MRI HEAD IMPRESSION: 1. Technically limited examination due to as motion artifact. Inability to tolerate the full length of the study as well 2. No acute intracranial infarct or other abnormality. 3. Mild chronic microvascular ischemic disease for age. MRA HEAD IMPRESSION: 1. Technically limited exam due to motion artifact. 2. Negative intracranial MRA for large vessel occlusion. No definite hemodynamically significant or correctable stenosis. Electronically Signed   By: Jeannine Boga M.D.   On: 05/27/2019 21:12   Mr Brain Wo Contrast  Result Date: 05/27/2019 CLINICAL DATA:  Initial evaluation for acute right-sided numbness and weakness, vertigo, unsteady. EXAM: MRI HEAD WITHOUT CONTRAST MRA HEAD WITHOUT CONTRAST TECHNIQUE: Multiplanar, multiecho pulse sequences of the brain and surrounding structures were obtained without intravenous contrast. Angiographic images of the head were obtained using MRA technique without contrast. COMPARISON:  Prior CT from earlier the same day. FINDINGS: MRI HEAD FINDINGS Brain: Examination technically limited as the patient was unable to tolerate the full length of the exam. Axial coronal DWI sequence, axial T2 and FLAIR sequences, and axial SWI sequence only were performed. Additionally, images are moderately degraded by motion artifact. Cerebral volume within normal limits for age. Mild scattered subcentimeter T2 signal abnormality noted within the periventricular and deep white matter both cerebral hemispheres, most likely related to chronic microvascular  ischemic changes, felt to be within normal limits for age. No abnormal foci of restricted diffusion to suggest acute or subacute ischemia. Gray-white matter differentiation maintained. No encephalomalacia to suggest chronic cortical infarction. No foci of susceptibility artifact to suggest acute or chronic intracranial hemorrhage. No mass lesion, midline shift or mass effect. No hydrocephalus. No extra-axial fluid collection. Vascular: Major intracranial vascular flow voids are maintained. Skull and upper cervical spine: Craniocervical junction grossly within normal limits on this limited exam. No focal marrow replacing lesion. Scalp soft tissues unremarkable. Sinuses/Orbits: Globes and orbital soft tissues demonstrate no acute finding. Patient status post bilateral ocular lens replacement. Paranasal sinuses are clear. Small bilateral mastoid effusions, left greater than right, of doubtful significance. Inner ear structures grossly normal. Other: None. MRA HEAD FINDINGS ANTERIOR CIRCULATION: Examination technically degraded by motion artifact. Distal cervical segments of the internal carotid arteries are patent with symmetric antegrade flow. Petrous segments widely patent. Scattered atheromatous irregularity throughout the cavernous/supraclinoid ICAs without definite hemodynamically significant stenosis. ICA termini well perfused. A1 segments widely patent. Normal anterior communicating artery complex. Anterior cerebral arteries patent to their distal aspects without high-grade stenosis. No M1 stenosis or occlusion. Normal MCA bifurcations. Distal MCA branches well perfused and symmetric. POSTERIOR CIRCULATION: Dominant right vertebral artery patent to the vertebrobasilar junction without stenosis. Left vertebral artery diminutive with scattered atheromatous irregularity without definite high-grade stenosis. Left PICA patent proximally. Right PICA not well seen. Basilar patent to its distal aspect without  flow-limiting stenosis. Superior cerebral arteries patent proximally. Both of the posterior cerebral arteries appear to be primarily supplied via the basilar, although a tiny bilateral posterior communicating arteries noted. PCAs patent to their distal aspects without high-grade stenosis. No appreciable aneurysm identified on this motion degraded exam. IMPRESSION: MRI HEAD IMPRESSION: 1. Technically limited examination due to as motion artifact. Inability to tolerate the full length of the study as well 2. No acute intracranial infarct  or other abnormality. 3. Mild chronic microvascular ischemic disease for age. MRA HEAD IMPRESSION: 1. Technically limited exam due to motion artifact. 2. Negative intracranial MRA for large vessel occlusion. No definite hemodynamically significant or correctable stenosis. Electronically Signed   By: Jeannine Boga M.D.   On: 05/27/2019 21:12   Mr Cervical Spine Wo Contrast  Result Date: 05/29/2019 CLINICAL DATA:  Chronic neck pain EXAM: MRI CERVICAL SPINE WITHOUT CONTRAST TECHNIQUE: Multiplanar, multisequence MR imaging of the cervical spine was performed. No intravenous contrast was administered. COMPARISON:  CTA head neck from 2 days ago. Cervical MRI May 16, 2014 FINDINGS: Alignment: 2 mm of anterolisthesis at C5-6, facet mediated Vertebrae: No fracture, evidence of discitis, or bone lesion. Cord: Normal signal and morphology. Posterior Fossa, vertebral arteries, paraspinal tissues: Negative Disc levels: C2-3: Moderate degenerative right foraminal spurring with foraminal impingement. C3-4: Degenerative facet spurring asymmetric to the right. Small central protrusion based on axial slices. No impingement C4-5: Degenerative facet spurring asymmetric to the left. No impingement. C5-6: Degenerative facet spurring that is bulky on the left, with mild anterolisthesis. Moderate left foraminal narrowing C6-7: Degenerative facet spurring on both sides. No evident impingement  C7-T1:Unremarkable. IMPRESSION: 1. Asymmetric, multilevel degenerative facet spurring with C5-6 mild anterolisthesis. 2. Facet spurs encroach on the foramina on the right at C2-3 and left C5-6. Electronically Signed   By: Monte Fantasia M.D.   On: 05/29/2019 09:00   Xr C-arm No Report  Result Date: 05/03/2019 Please see Notes tab for imaging impression.   Microbiology: Recent Results (from the past 240 hour(s))  Novel Coronavirus,NAA,(SEND-OUT TO REF LAB - TAT 24-48 hrs); Hosp Order     Status: None   Collection Time: 05/27/19  1:04 PM   Specimen: Nasopharyngeal Swab; Respiratory  Result Value Ref Range Status   SARS-CoV-2, NAA NOT DETECTED NOT DETECTED Final    Comment: (NOTE) This test was developed and its performance characteristics determined by [employer]. This test has not been FDA cleared or approved. This test has been authorized by FDA under an Emergency Use Authorization (EUA). This test is only authorized for the duration of time the declaration that circumstances exist justifying the authorization of the emergency use of in vitro diagnostic tests for detection of SARS-CoV-2 virus and/or diagnosis of COVID-19 infection under section 564(b)(1) of the Act, 21 U.S.C. 161WRU-0(A)(5), unless the authorization is terminated or revoked sooner. When diagnostic testing is negative, the possibility of a false negative result should be considered in the context of a patient's recent exposures and the presence of clinical signs and symptoms consistent with COVID-19. An individual without symptoms of COVID-19 and who is not shedding SARS-CoV-2 virus would expect to have a negative (not detected) result in this assay. Performed  At: [facility] [address] [place], [place] [id number] Rush Farmer MD NW:[id number]    [place]  Final    Comment: Performed at [facility], [place] [address], [place], [place] [address]      Labs: Basic Metabolic Panel: Recent Labs  Lab 05/27/19 1040 05/27/19 1052  NA 140 138  K 3.8 3.6  CL 105 104  CO2 24  --   GLUCOSE 109* 106*  BUN 17 18  CREATININE 1.03* 1.00  CALCIUM 9.2  --    Liver Function Tests: Recent Labs  Lab 05/27/19 1040  AST 29  ALT 21  ALKPHOS 85  BILITOT 0.4  PROT 7.0  ALBUMIN 3.6   No results for input(s): LIPASE, AMYLASE in  the last 168 hours. No results for input(s): AMMONIA in the last 168 hours. CBC: Recent Labs  Lab 05/27/19 1040 05/27/19 1052  WBC 8.4  --   NEUTROABS 4.8  --   HGB 13.3 13.6  HCT 40.2 40.0  MCV 93.9  --   PLT 288  --    Cardiac Enzymes: No results for input(s): CKTOTAL, CKMB, CKMBINDEX, TROPONINI in the last 168 hours. BNP: BNP (last 3 results) No results for input(s): BNP in the last 8760 hours.  ProBNP (last 3 results) No results for input(s): PROBNP in the last 8760 hours.  CBG: Recent Labs  Lab 05/28/19 1407 05/28/19 1713 05/28/19 2103 05/29/19 0628 05/29/19 1211  GLUCAP 107* 116* 92 91 127*       Signed:  Radene Gunning NP.  [facility] 05/29/2019, 1:30 PM

## 2019-05-29 NOTE — Plan of Care (Signed)
Adequate for discharge.

## 2019-05-29 NOTE — Evaluation (Signed)
Occupational Therapy Evaluation Patient Details Name: Natalie Bond MRN: [id number] DOB: [birth year] Today's Date: 05/29/2019    History of Present Illness Pt with R arm numbness, decreased coordination and  weakness with negative CT head and negative MRI brain. Pt PMHx: HTN, HLD, DMT2, h/o TIA. Pt reports difficulty speaking sometimes.    Clinical Impression   Pt PTA: living with spouse. Pt currently performing ADL functional transfers and mobility with no Ad and fair balance, supervisionA. Pt performing ADL tasks with BUEs at sink with supervisionA. Pt given education and HEP for visual deficits R eye abducts with scanning and had blurry vision. Pt given handout for taping technique and glasses; pt performed fine motor coordination exercises with and without theraputty. Pt tolerating session very well. Pt happens to be ambidextrous so RUE with decreased coordination requires increased time for tasks. Pt performing own ADL with increased time. Pt would benefit from continued OT skilled services for ADL, mobility and safety in OP OT setting. OT following acutely.  Pt does not have insurance for therapy coverage outside of hospital stay so pt given education materials and handouts for progressive HEP.       Follow Up Recommendations  Outpatient OT;Supervision - Intermittent    Equipment Recommendations  None recommended by OT    Recommendations for Other Services       Precautions / Restrictions Precautions Precautions: Fall Restrictions Weight Bearing Restrictions: No      Mobility Bed Mobility               General bed mobility comments: OOB at entry  Transfers Overall transfer level: Needs assistance Equipment used: None Transfers: Sit to/from Stand Sit to Stand: Supervision         General transfer comment: patient standing at Sink upon entry    Balance Overall balance assessment: Needs assistance   Sitting balance-Leahy Scale: Good       Standing  balance-Leahy Scale: Fair                             ADL either performed or assessed with clinical judgement   ADL Overall ADL's : At baseline                                       General ADL Comments: pt is ambidexturous so R hand coordination deficits reduces ability to perform fine motor coordination tasks, but able to perform with increased time and use of L hand.     Vision Baseline Vision/History: Wears glasses Wears Glasses: At all times Patient Visual Report: Blurring of vision;Peripheral vision impairment Vision Assessment?: Yes Eye Alignment: Impaired (comment)(pt's R eye abducts and requires cues to keep eye in line.) Ocular Range of Motion: Within Functional Limits Alignment/Gaze Preference: Within Defined Limits Tracking/Visual Pursuits: Able to track stimulus in all quads without difficulty Saccades: Within functional limits Diplopia Assessment: Disappears with one eye closed Additional Comments: Pt given eye exercises with handout; pt given taping technique for R eye, pt believes that she can see better with the glasses taped.     Perception     Praxis      Pertinent Vitals/Pain Pain Assessment: No/denies pain     Hand Dominance Left   Extremity/Trunk Assessment Upper Extremity Assessment Upper Extremity Assessment: RUE deficits/detail RUE Deficits / Details: 3/5 MM grade RUE Coordination: decreased fine motor;decreased gross motor  Lower Extremity Assessment Lower Extremity Assessment: Overall WFL for tasks assessed   Cervical / Trunk Assessment Cervical / Trunk Assessment: Normal   Communication Communication Communication: No difficulties   Cognition Arousal/Alertness: Awake/alert Behavior During Therapy: WFL for tasks assessed/performed Overall Cognitive Status: Impaired/Different from baseline Area of Impairment: Memory;Problem solving                     Memory: Decreased short-term memory        Problem Solving: Slow processing;Decreased initiation General Comments: Pt reports "my mind isn't working like it used toWalt Disney Comments  Pt given theraputty and exercises to assist with R hand coordination deficits.    Exercises Exercises: Other exercises Other Exercises Other Exercises: HEP for fine motor coordination, visual scanning and tracking, eye taping method with glasses provided and tape. Other Exercises: perfromed all above exercises   Shoulder Instructions      Home Living Family/patient expects to be discharged to:: Private residence Living Arrangements: Spouse/significant other Available Help at Discharge: Family Type of Home: House Home Access: Stairs to enter [employer] of Steps: 4 Entrance Stairs-Rails: None       Bathroom Shower/Tub: Gaffer;Tub/shower unit   Bathroom Toilet: Standard     Home Equipment: Environmental consultant - 2 wheels;Cane - single point          Prior Functioning/Environment Level of Independence: Independent                 OT Problem List: Decreased strength;Decreased activity tolerance;Impaired vision/perception;Decreased coordination;Decreased safety awareness;Impaired UE functional use      OT Treatment/Interventions: Self-care/ADL training;Therapeutic exercise;Energy conservation;Neuromuscular education;Therapeutic activities;Patient/family education;Balance training;Visual/perceptual remediation/compensation    OT Goals(Current goals can be found in the care plan section) Acute Rehab OT Goals Patient Stated Goal: to go home  OT Goal Formulation: With patient Time For Goal Achievement: 06/12/19 Potential to Achieve Goals: Good ADL Goals Pt/caregiver will Perform Home Exercise Program: Right Upper extremity;With theraputty;With written HEP provided;Independently Additional ADL Goal #1: Pt Modified independent with use of RUE for ADL tasks with fair balance  OT Frequency: Min 2X/week   Barriers to D/C:             Co-evaluation              AM-PAC OT "6 Clicks" Daily Activity     Outcome Measure Help from another person eating meals?: None Help from another person taking care of personal grooming?: None Help from another person toileting, which includes using toliet, bedpan, or urinal?: None Help from another person bathing (including washing, rinsing, drying)?: A Little Help from another person to put on and taking off regular upper body clothing?: None Help from another person to put on and taking off regular lower body clothing?: None 6 Click Score: 23   End of Session Equipment Utilized During Treatment: Gait belt Nurse Communication: Mobility status  Activity Tolerance: Patient tolerated treatment well Patient left: in chair;with call bell/phone within reach;with chair alarm set  OT Visit Diagnosis: Muscle weakness (generalized) (M62.81);Other symptoms and signs involving the nervous system (R29.898)                Time: 1010-1107 OT Time Calculation (min): 57 min Charges:  OT General Charges $OT Visit: 1 Visit OT Evaluation $OT Eval Moderate Complexity: 1 Mod OT Treatments $Self Care/Home Management : 8-22 mins $Neuromuscular Re-education: 8-22 mins $Therapeutic Exercise: 8-22 mins  Darryl Nestle) Natalie Bond OTR/L Acute Rehabilitation Services Pager: [phone] Office: [phone]  Natalie Bond Natalie Bond 05/29/2019, 2:54 PM

## 2019-05-29 NOTE — Discharge Instructions (Signed)
Follow up with Neurosurgery in 4 weeks after discharge. You should be called for the appointment. If in 1-2 weeks, you did not get call back, please call the number we give you to make an appointment.

## 2019-05-29 NOTE — Progress Notes (Signed)
Physical Therapy Treatment Patient Details Name: Natalie Bond MRN: [id number] DOB: 1956/11/23 Today's Date: 05/29/2019    History of Present Illness Patient began having intial onset of numbness in right arm and decresed coordination on 05/23/2019. The symptoms were intermittent. She felt like over the weekend they increased. At this time she is having decreased sensation in right UE and reports decreased coorsintation in her leg. Her     PT Comments    Patient ambulating without AD, with balance and strength impairments noted. Would strongly benefit from ongoing physical therapy after d/c. Reviewed therex with patient.   Follow Up Recommendations  Home health PT;Outpatient PT     Equipment Recommendations  None recommended by PT    Recommendations for Other Services       Precautions / Restrictions Restrictions Weight Bearing Restrictions: No    Mobility  Bed Mobility               General bed mobility comments: OOB at entry  Transfers                 General transfer comment: patient standing at [facility] upon entry  Ambulation/Gait Ambulation/Gait assistance: Min guard;Supervision Gait Distance (Feet): 150 Feet Assistive device: None Gait Pattern/deviations: Drifts right/left Gait velocity: decreased   General Gait Details: mild R LOB, overall able to ambulate without AD or overt LOB. RLE weakness causes mild buckle   Stairs             Wheelchair Mobility    Modified Rankin (Stroke Patients Only) Modified Rankin (Stroke Patients Only) Pre-Morbid Rankin Score: No symptoms Modified Rankin: Moderate disability     Balance Overall balance assessment: Needs assistance Sitting-balance support: No upper extremity supported;Feet supported Sitting balance-Leahy Scale: Good     Standing balance support: No upper extremity supported Standing balance-Leahy Scale: Fair                              Cognition Arousal/Alertness:  Awake/alert Behavior During Therapy: WFL for tasks assessed/performed Overall Cognitive Status: Within Functional Limits for tasks assessed                                        Exercises Other Exercises Other Exercises: Patient given HEP for quad set, SLR, LAQ , hip abduction, standing hip 3 way, standing march, balance: tandem stance, narrow base eyes open and closed, narrow base with head turns. Explained each exercise and how to perfrom safely. Patient has a left hip replacement. Advised not to go to end ranges with exercises just until the muslce engages. Also reviewed safety with husband for balance exercises. Patient given bands to advance sitting exercises.  Other Exercises: perfromed all above exercises    General Comments        Pertinent Vitals/Pain      Home Living                      Prior Function            PT Goals (current goals can now be found in the care plan section) Acute Rehab PT Goals PT Goal Formulation: With patient Time For Goal Achievement: 06/04/19 Potential to Achieve Goals: Good    Frequency    Min 3X/week      PT Plan      Co-evaluation  AM-PAC PT "6 Clicks" Mobility   Outcome Measure  Help needed turning from your back to your side while in a flat bed without using bedrails?: A Little Help needed moving from lying on your back to sitting on the side of a flat bed without using bedrails?: A Little Help needed moving to and from a bed to a chair (including a wheelchair)?: A Little Help needed standing up from a chair using your arms (e.g., wheelchair or bedside chair)?: A Little Help needed to walk in hospital room?: A Little Help needed climbing 3-5 steps with a railing? : A Lot 6 Click Score: 17    End of Session   Activity Tolerance: Patient tolerated treatment well Patient left: with call bell/phone within reach;with family/visitor present Nurse Communication: Mobility status PT  Visit Diagnosis: Other abnormalities of gait and mobility (R26.89);Unsteadiness on feet (R26.81)     Time: 5797-2820 PT Time Calculation (min) (ACUTE ONLY): 30 min  Charges:  $Gait Training: 8-22 mins $Therapeutic Exercise: 8-22 mins                     Reinaldo Berber, PT, DPT Acute Rehabilitation Services Pager: [phone] Office: [phone]     Reinaldo Berber 05/29/2019, 9:24 AM

## 2019-05-29 NOTE — Evaluation (Signed)
Speech Language Pathology Evaluation Patient Details Name: Natalie Bond MRN: [id number] DOB: Mar 23, 1956 Today's Date: 05/29/2019 Time: 1207-1228 SLP Time Calculation (min) (ACUTE ONLY): 21 min  Problem List:  Patient Active Problem List   Diagnosis Date Noted  . 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 Right arm weakness 05/27/2019  . Type 2 diabetes mellitus without complication ([place]) 58/07/9832  . Arthritis of carpometacarpal [facility]) joint of right thumb 04/16/2019  . Mixed conductive and sensorineural hearing loss of left ear with restricted hearing of right ear 04/26/2018  . Vertigo 04/26/2018  . Venous stasis dermatitis of left lower extremity 04/09/2018  . Achilles tendon contracture, left 04/09/2018  . Pain in left ankle and joints of left foot 04/09/2018  . Primary osteoarthritis of first carpometacarpal joint of right hand 10/09/2017  . Bilateral hand pain 10/05/2017  . Primary osteoarthritis of both first carpometacarpal joints 10/05/2017  . Trochanteric bursitis, right hip 06/29/2017  . Degenerative disc disease at L5-S1 level 05/04/2017  . History of joint replacement 05/04/2017  . Claw toe, acquired, right 04/27/2017  . Achilles tendon contracture, right 04/27/2017  . Pain in right foot 04/11/2017  . Status post left hip replacement 10/21/2016  . Unilateral primary osteoarthritis, left hip 09/23/2016    Class: Chronic  . Chronic back pain 01/14/2016  . Migraine 01/14/2016  . Essential hypertension 01/22/2015   Past Medical History:  Past Medical History:  Diagnosis Date  . Arthritis    back, wrists  . Cancer ([place])    skin cancer  . Complication of anesthesia   . Diabetes mellitus without complication ([place])    pt recently started on metformin  . GERD (gastroesophageal reflux disease)   . Headache   . High cholesterol   . History of bronchitis   . History of kidney stones    2015  . Hypertension   . PONV (postoperative nausea and vomiting)   . Sleep apnea     pt. does not use a CPAP or BiPAP  . Stroke ([place])    Pt. states possible mini stroke in 2014, no deficits   Past Surgical History:  Past Surgical History:  Procedure Laterality Date  . ABDOMINAL HYSTERECTOMY    . BACK SURGERY    . CARDIAC CATHETERIZATION     01/27/15 [facility]): EF 60%, normal coronaries.  Mortimer Fries SUSPENSION PLASTY Right 12/12/2018   Procedure: RIGHT THUMB CARPOMETACARPAL ([place]) ARTHROPLASTY;  Surgeon: Leandrew Koyanagi, MD;  Location: [place];  Service: Orthopedics;  Laterality: Right;  . CATARACT EXTRACTION, BILATERAL    . CHOLECYSTECTOMY  07/2016   done [place]  . EYE SURGERY    . HERNIA REPAIR Left 1990   left groin   . LOWER EXTREMITY ANGIOGRAM Left 06/08/2015   Procedure: Ascending Venogram  Iliac Vein ;  Surgeon: Elam Dutch, MD;  [facility];  Service: Vascular;  Laterality: Left;  ;  [facility] [place] [employer] / [place] HAVE BEEN NOTIFIED/ CONFIRMED  . PARTIAL HYSTERECTOMY    . SHOULDER SURGERY    . TOTAL HIP ARTHROPLASTY Left 10/21/2016   Procedure: LEFT TOTAL HIP ARTHROPLASTY ANTERIOR APPROACH;  Surgeon: Mcarthur Rossetti, MD;  Location: [facility] ORS;  Service: Orthopedics;  Laterality: Left;  . WRIST SURGERY Bilateral    carpal tunnel   HPI:  Pt is a 63 y.o. female,  with hypertension, hyperlipidemia, dm2, OSA , gerd, h/o TIA , who presented with c/o right upper extremity weakness starting on 05/23/19 while washing a fish tank.  Pt noted numbness, vertigo, unsteady gait, slight right sided facial numbness, and leaning to the right which worsened on 05/27/19 and she presented to ED for evaluation.  MRI was negative for acute changes.    Assessment / Plan / Recommendation Clinical Impression  Pt participated in speech/language evaluation with her husband present.  Both parties denied any baseline deficits in speech, language or cognition but reported that she was demonstrating difficulty with word retrieval which has since  resolved and dysarthria which she reported is now 95% back to baseline. Her motor speech and language skills are currently within normal limits and no overt cognitive deficits were noted. Further skilled SLP services are not clinically indicated at this time. Pt, nursing, and her husband were educated regarding this and all parties verbalized understanding as well as agreement with plan of care.    SLP Assessment  SLP Recommendation/Assessment: Patient does not need any further Speech Lanaguage Pathology Services SLP Visit Diagnosis: Dysarthria and anarthria (R47.1)    Follow Up Recommendations  None    Frequency and Duration           SLP Evaluation Cognition  Overall Cognitive Status: Within Functional Limits for tasks assessed Arousal/Alertness: Awake/alert Orientation Level: Oriented X4 Attention: Focused;Sustained Focused Attention: Appears intact Sustained Attention: Appears intact Memory: Appears intact(Immediate: 3/3; Delayed: 3/3) Awareness: Appears intact Problem Solving: Appears intact       Comprehension  Auditory Comprehension Overall Auditory Comprehension: Appears within functional limits for tasks assessed Yes/No Questions: Within Functional Limits Basic Biographical Questions: (5/5) Complex Questions: (4/5) Paragraph Comprehension (via yes/no questions): (4/4) Commands: Within Functional Limits One Step Basic Commands: (4/4) Two Step Basic Commands: (4/4) Multistep Basic Commands: (4/4) Visual Recognition/Discrimination Discrimination: Within Function Limits Reading Comprehension Reading Status: Not tested    Expression Expression Primary Mode of Expression: Verbal Verbal Expression Overall Verbal Expression: Appears within functional limits for tasks assessed Initiation: No impairment Level of Generative/Spontaneous Verbalization: Conversation Repetition: No impairment(5/5) Naming: No impairment Responsive: (5/5) Confrontation: Within functional  limits(10/10) Convergent: (Sentence completion: 5/5) Pragmatics: No impairment   Oral / Motor  Oral Motor/Sensory Function Overall Oral Motor/Sensory Function: Within functional limits Motor Speech Overall Motor Speech: Appears within functional limits for tasks assessed(95% back to baseline. ) Respiration: Within functional limits Phonation: Normal Resonance: Within functional limits Articulation: Within functional limitis Intelligibility: Intelligible Motor Planning: Witnin functional limits Motor Speech Errors: Not applicable   Siddhartha Hoback I. Hardin Negus, [place], [place] Office number [phone] Pager [place] 05/29/2019, 12:36 PM

## 2019-06-06 DIAGNOSIS — G8190 Hemiplegia, unspecified affecting unspecified side: Secondary | ICD-10-CM | POA: Insufficient documentation

## 2019-06-06 DIAGNOSIS — E1142 Type 2 diabetes mellitus with diabetic polyneuropathy: Secondary | ICD-10-CM | POA: Insufficient documentation

## 2019-06-13 ENCOUNTER — Other Ambulatory Visit: Payer: Self-pay | Admitting: Internal Medicine

## 2019-06-13 DIAGNOSIS — E041 Nontoxic single thyroid nodule: Secondary | ICD-10-CM

## 2019-06-18 ENCOUNTER — Other Ambulatory Visit: Payer: Self-pay | Admitting: Neurosurgery

## 2019-06-18 DIAGNOSIS — M5481 Occipital neuralgia: Secondary | ICD-10-CM

## 2019-07-01 ENCOUNTER — Other Ambulatory Visit: Payer: PRIVATE HEALTH INSURANCE

## 2019-07-02 ENCOUNTER — Other Ambulatory Visit: Payer: PRIVATE HEALTH INSURANCE

## 2019-08-28 DIAGNOSIS — R1110 Vomiting, unspecified: Secondary | ICD-10-CM | POA: Insufficient documentation

## 2019-08-29 ENCOUNTER — Encounter: Payer: Self-pay | Admitting: Orthopaedic Surgery

## 2019-08-29 ENCOUNTER — Ambulatory Visit (INDEPENDENT_AMBULATORY_CARE_PROVIDER_SITE_OTHER): Payer: PRIVATE HEALTH INSURANCE

## 2019-08-29 ENCOUNTER — Ambulatory Visit (INDEPENDENT_AMBULATORY_CARE_PROVIDER_SITE_OTHER): Payer: Self-pay | Admitting: Orthopaedic Surgery

## 2019-08-29 ENCOUNTER — Encounter: Payer: Self-pay | Admitting: Nurse Practitioner

## 2019-08-29 DIAGNOSIS — M25572 Pain in left ankle and joints of left foot: Secondary | ICD-10-CM

## 2019-08-29 MED ORDER — METHYLPREDNISOLONE 4 MG PO TABS: ORAL_TABLET | ORAL | 0 refills | Status: DC

## 2019-08-29 MED ORDER — ACETAMINOPHEN-CODEINE #3 300-30 MG PO TABS: 1.0000 | ORAL_TABLET | Freq: Three times a day (TID) | ORAL | 0 refills | Status: DC | PRN

## 2019-08-29 NOTE — Progress Notes (Signed)
Office Visit Note   [name]           Date of Birth: [birth year]           MRN: [id number] Visit Date: 08/29/2019              Requested by: Leanna Battles, MD [place],  [place] [address] PCP: Leanna Battles, MD   Assessment & Plan: Visit Diagnoses:  1. Pain in left ankle and joints of left foot     Plan: Certainly there is some type of inflammatory process that is bothering her left ankle.  I will start her on a 6-day steroid taper and some Tylenol 3 for pain.  We will also put her in a short cam walking boot for stability purposes I would like to see her back in 2 weeks for repeat exam.  All question concerns were answered and addressed.  Follow-Up Instructions: Return in about 2 weeks (around 09/12/2019).   Orders:  Orders Placed This Encounter  Procedures  . XR Ankle Complete Left   Meds ordered this encounter  Medications  . methylPREDNISolone (MEDROL) 4 MG tablet    Sig: Medrol dose pack. Take as instructed    Dispense:  21 tablet    Refill:  0  . acetaminophen-codeine (TYLENOL #3) 300-30 MG tablet    Sig: Take 1-2 tablets by mouth every 8 (eight) hours as needed for moderate pain.    Dispense:  30 tablet    Refill:  0      Procedures: No procedures performed   Clinical Data: No additional findings.   Subjective: Chief Complaint  Patient presents with  . Left Ankle - Pain  The patient comes in today with a week to 2 weeks history of left ankle pain and swelling.  She has had no known recent injury but she did have an ankle injury about 30 years ago and she told her there was a fracture.  She does have chronic lymphedema in that left lower extremity in terms of leg swelling which she says is always been more swollen since she had back surgery.  She is tried anti-inflammatories but that has not helped and is bothering her stomach.  She does have an ASO at home which she is tried and that is not helping either.  She points the  lateral aspect of her ankle as a source of her pain.  She denies any recent illnesses or acute changes in her health status.  HPI  Review of Systems She currently denies any headache, chest pain, shortness of breath, fever, chills, nausea, vomiting  Objective: Vital Signs: There were no vitals taken for this visit.  Physical Exam She is alert and orient x3 and in no acute distress Ortho Exam Examination of her left lower extremity does show that she does have a slightly larger leg from peripheral edema on the left side comparing left and right side.  The left ankle is swollen but the range of motion is full and his ligaments is stable.  There is a lot of pain on the anterior lateral aspect of the ankle anterior and posterior to the fibula.  There is some medial tenderness as well.  Again the ankle range of motion is full. Specialty Comments:  No specialty comments available.  Imaging: Xr Ankle Complete Left  Result Date: 08/29/2019 3 views left ankle show no acute findings.  There is soft tissue swelling.  There is mild to moderate arthritic changes on  the medial compartment of the left ankle.    PMFS History: Patient Active Problem List   Diagnosis Date Noted  . TIA (transient ischemic attack) 05/29/2019  . Anterolisthesis 05/29/2019  . Thyroid nodule 05/29/2019  . 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 Right arm weakness 05/27/2019  . Type 2 diabetes mellitus without complication ([place]) 99991111  . Arthritis of carpometacarpal [facility]) joint of right thumb 04/16/2019  . Mixed conductive and sensorineural hearing loss of left ear with restricted hearing of right ear 04/26/2018  . Vertigo 04/26/2018  . Venous stasis dermatitis of left lower extremity 04/09/2018  . Achilles tendon contracture, left 04/09/2018  . Pain in left ankle and joints of left foot 04/09/2018  . Primary osteoarthritis of first carpometacarpal joint of right hand 10/09/2017  . Bilateral hand pain 10/05/2017   . Primary osteoarthritis of both first carpometacarpal joints 10/05/2017  . Trochanteric bursitis, right hip 06/29/2017  . Degenerative disc disease at L5-S1 level 05/04/2017  . History of joint replacement 05/04/2017  . Claw toe, acquired, right 04/27/2017  . Achilles tendon contracture, right 04/27/2017  . Pain in right foot 04/11/2017  . Status post left hip replacement 10/21/2016  . Unilateral primary osteoarthritis, left hip 09/23/2016    Class: Chronic  . Chronic back pain 01/14/2016  . Migraine 01/14/2016  . Essential hypertension 01/22/2015   Past Medical History:  Diagnosis Date  . Arthritis    back, wrists  . Cancer ([place])    skin cancer  . Complication of anesthesia   . Diabetes mellitus without complication ([place])    pt recently started on metformin  . GERD (gastroesophageal reflux disease)   . Headache   . High cholesterol   . History of bronchitis   . History of kidney stones    2015  . Hypertension   . PONV (postoperative nausea and vomiting)   . Sleep apnea    pt. does not use a CPAP or BiPAP  . Stroke ([place])    Pt. states possible mini stroke in 2014, no deficits    Family History  Problem Relation Age of Onset  . Cancer Mother   . Diabetes Mother   . Heart disease Mother   . Hyperlipidemia Mother   . Hypertension Mother   . Cancer Father   . Diabetes Father   . Heart disease Father   . Hyperlipidemia Father   . Hypertension Father   . Varicose Veins Father   . Cancer Sister   . Heart disease Sister   . Hyperlipidemia Sister   . Cancer Brother   . Diabetes Brother   . Hyperlipidemia Brother     Past Surgical History:  Procedure Laterality Date  . ABDOMINAL HYSTERECTOMY    . BACK SURGERY    . CARDIAC CATHETERIZATION     01/27/15 [facility]): EF 60%, normal coronaries.  Mortimer Fries SUSPENSION PLASTY Right 12/12/2018   Procedure: RIGHT THUMB CARPOMETACARPAL ([place]) ARTHROPLASTY;  Surgeon: Leandrew Koyanagi, MD;  Location: [place];   Service: Orthopedics;  Laterality: Right;  . CATARACT EXTRACTION, BILATERAL    . CHOLECYSTECTOMY  07/2016   done [place]  . EYE SURGERY    . HERNIA REPAIR Left 1990   left groin   . LOWER EXTREMITY ANGIOGRAM Left 06/08/2015   Procedure: Ascending Venogram  Iliac Vein ;  Surgeon: Elam Dutch, MD;  Location: [facility] OR;  Service: Vascular;  Laterality: Left;  ;  TARA- [facility] / ZACH-[place] HAVE BEEN NOTIFIED/ CONFIRMED  .  PARTIAL HYSTERECTOMY    . SHOULDER SURGERY    . TOTAL HIP ARTHROPLASTY Left 10/21/2016   Procedure: LEFT TOTAL HIP ARTHROPLASTY ANTERIOR APPROACH;  Surgeon: Mcarthur Rossetti, MD;  [facility];  Service: Orthopedics;  Laterality: Left;  . WRIST SURGERY Bilateral    carpal tunnel   Social History   Occupational History  . 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 Smoking status: Never Smoker  . Smokeless tobacco: Never Used  Substance and Sexual Activity  . Alcohol use: No  . Drug use: No  . Sexual activity: Not on file

## 2019-09-06 ENCOUNTER — Ambulatory Visit: Payer: PRIVATE HEALTH INSURANCE | Admitting: Nurse Practitioner

## 2019-09-12 ENCOUNTER — Encounter: Payer: Self-pay | Admitting: Orthopaedic Surgery

## 2019-09-12 ENCOUNTER — Ambulatory Visit (INDEPENDENT_AMBULATORY_CARE_PROVIDER_SITE_OTHER): Payer: PRIVATE HEALTH INSURANCE | Admitting: Orthopaedic Surgery

## 2019-09-12 ENCOUNTER — Other Ambulatory Visit: Payer: Self-pay

## 2019-09-12 DIAGNOSIS — M25572 Pain in left ankle and joints of left foot: Secondary | ICD-10-CM

## 2019-09-12 NOTE — Progress Notes (Signed)
The patient is continuing to have left ankle pain.  I saw her 2 weeks ago and placed in a cam walking boot.  Her pain and swelling have been at the lateral ankle and posterior to the fibula.  It is still been a consistent pain for her.  The boot has helped somewhat.  It is more activity related.  She still gets a lot of swelling in her ankle as well.  On examination of her left ankle she is very point tender along the peroneal tendons.  You invert or evert her foot and most of the pain with inversion is along the course of the peroneal tendons.  She is very swollen in this area as well.  Her [place] Silos test is negative.  Her Achilles is nonpainful to exam.  There is no pain over the medial ankle structures.  At this point an MRI is warranted to rule out a tear of the peroneus brevis or longus tendons or to assess for the degree of tenosynovitis that she has based on what worsening on exam.  All question concerns were answered addressed.  She will continue the walking boot until we see her after the MRI.  She also continue Voltaren gel.

## 2019-09-23 ENCOUNTER — Other Ambulatory Visit: Payer: Self-pay

## 2019-09-23 ENCOUNTER — Ambulatory Visit (HOSPITAL_COMMUNITY)
Admission: RE | Admit: 2019-09-23 | Discharge: 2019-09-23 | Disposition: A | Discharge status: Home / Self Care | Payer: Self-pay | Source: Ambulatory Visit | Attending: Orthopaedic Surgery | Admitting: Orthopaedic Surgery

## 2019-09-23 DIAGNOSIS — M25572 Pain in left ankle and joints of left foot: Secondary | ICD-10-CM | POA: Insufficient documentation

## 2019-09-25 ENCOUNTER — Ambulatory Visit: Payer: PRIVATE HEALTH INSURANCE | Admitting: Physician Assistant

## 2019-09-30 ENCOUNTER — Ambulatory Visit (INDEPENDENT_AMBULATORY_CARE_PROVIDER_SITE_OTHER): Payer: 59 | Admitting: Physician Assistant

## 2019-09-30 ENCOUNTER — Other Ambulatory Visit: Payer: Self-pay

## 2019-09-30 ENCOUNTER — Encounter: Payer: Self-pay | Admitting: Physician Assistant

## 2019-09-30 DIAGNOSIS — M25572 Pain in left ankle and joints of left foot: Secondary | ICD-10-CM

## 2019-09-30 DIAGNOSIS — S8265XD Nondisplaced fracture of lateral malleolus of left fibula, subsequent encounter for closed fracture with routine healing: Secondary | ICD-10-CM | POA: Diagnosis not present

## 2019-09-30 NOTE — Progress Notes (Signed)
Office Visit Note   [name]           Date of Birth: 10-Aug-1956           MRN: [id number] Visit Date: 09/30/2019              Requested by: Natalie Battles, MD [place],  [place] [address] PCP: Natalie Battles, MD   Assessment & Plan: Visit Diagnoses:  1. Pain in left ankle and joints of left foot   2. Closed nondisplaced fracture of lateral malleolus of left fibula with routine healing, subsequent encounter     Plan: We will place her in a nonweightbearing short leg cast for the next 2 weeks.  Have her take 81 mg aspirin enteric coated twice daily.  Elevation left leg and wiggling toes encouraged.  We will see her back in 2 weeks remove the cast and most likely at this point time we will place her back in a cam walker boot.  No radiographs at that time unless clinically indicated.  Follow-Up Instructions: Return in about 2 weeks (around 10/14/2019).   Orders:  No orders of the defined types were placed in this encounter.  No orders of the defined types were placed in this encounter.     Procedures: No procedures performed   Clinical Data: No additional findings.   Subjective: Chief Complaint  Patient presents with  . Left Ankle - Pain, Follow-up    HPI [name] is now approximately 7 weeks status post left ankle injury.  She returns today to go over the MRI of her left ankle.  Continues have pain in the lateral aspect of the ankle.  She wearing a short cam walker boot.  She states she continues to have swelling that dissipates overnight.  No new injury.  Denies any history of personal pulmonary embolism or family history of DVT PE.  MRI dated 09/23/2019 left ankle images are reviewed with the patient.  Linear low intensity signal seen at lateral malleolus consistent with a nondisplaced fracture.  Also interstitial tear of the peroneus longus tendon and a split tear involving the peroneus brevis tendon seen.  Review of Systems Denies  any fever chills shortness of breath chest pain.  Objective: Vital Signs: There were no vitals taken for this visit.  Physical Exam Constitutional:      Appearance: She is not ill-appearing.  Cardiovascular:     Pulses: Normal pulses.  Pulmonary:     Effort: Pulmonary effort is normal.  Neurological:     Mental Status: She is alert and oriented to person, place, and time.     Ortho Exam Left ankle tenderness over the lateral malleolus and over the peroneal tendons distal to the lateral malleolus.  There is no rashes skin lesions ulcerations of the left ankle.  Mild edema lateral aspect of the left ankle.  Calf supple minimal tenderness.  Tenderness over the medial malleolus. Specialty Comments:  No specialty comments available.  Imaging: No results found.   PMFS History: Patient Active Problem List   Diagnosis Date Noted  . TIA (transient ischemic attack) 05/29/2019  . Anterolisthesis 05/29/2019  . Thyroid nodule 05/29/2019  . 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 Right arm weakness 05/27/2019  . Type 2 diabetes mellitus without complication ([place]) 99991111  . Arthritis of carpometacarpal [facility]) joint of right thumb 04/16/2019  . Mixed conductive and sensorineural hearing loss of left ear with restricted hearing of right ear 04/26/2018  . Vertigo 04/26/2018  .  Venous stasis dermatitis of left lower extremity 04/09/2018  . Achilles tendon contracture, left 04/09/2018  . Pain in left ankle and joints of left foot 04/09/2018  . Primary osteoarthritis of first carpometacarpal joint of right hand 10/09/2017  . Bilateral hand pain 10/05/2017  . Primary osteoarthritis of both first carpometacarpal joints 10/05/2017  . Trochanteric bursitis, right hip 06/29/2017  . Degenerative disc disease at L5-S1 level 05/04/2017  . History of joint replacement 05/04/2017  . Claw toe, acquired, right 04/27/2017  . Achilles tendon contracture, right 04/27/2017  . Pain in right foot  04/11/2017  . Status post left hip replacement 10/21/2016  . Unilateral primary osteoarthritis, left hip 09/23/2016    Class: Chronic  . Chronic back pain 01/14/2016  . Migraine 01/14/2016  . Essential hypertension 01/22/2015   Past Medical History:  Diagnosis Date  . Arthritis    back, wrists  . Cancer ([place])    skin cancer  . Complication of anesthesia   . Diabetes mellitus without complication ([place])    pt recently started on metformin  . GERD (gastroesophageal reflux disease)   . Headache   . High cholesterol   . History of bronchitis   . History of kidney stones    2015  . Hypertension   . PONV (postoperative nausea and vomiting)   . Sleep apnea    pt. does not use a CPAP or BiPAP  . Stroke ([place])    Pt. states possible mini stroke in 2014, no deficits    Family History  Problem Relation Age of Onset  . Cancer Mother   . Diabetes Mother   . Heart disease Mother   . Hyperlipidemia Mother   . Hypertension Mother   . Cancer Father   . Diabetes Father   . Heart disease Father   . Hyperlipidemia Father   . Hypertension Father   . Varicose Veins Father   . Cancer Sister   . Heart disease Sister   . Hyperlipidemia Sister   . Cancer Brother   . Diabetes Brother   . Hyperlipidemia Brother     Past Surgical History:  Procedure Laterality Date  . ABDOMINAL HYSTERECTOMY    . BACK SURGERY    . CARDIAC CATHETERIZATION     01/27/15 [facility]): EF 60%, normal coronaries.  Natalie Bond SUSPENSION PLASTY Right 12/12/2018   Procedure: RIGHT THUMB CARPOMETACARPAL ([place]) ARTHROPLASTY;  Surgeon: Natalie Koyanagi, MD;  Location: [place];  Service: Orthopedics;  Laterality: Right;  . CATARACT EXTRACTION, BILATERAL    . CHOLECYSTECTOMY  07/2016   done [place]  . EYE SURGERY    . HERNIA REPAIR Left 1990   left groin   . LOWER EXTREMITY ANGIOGRAM Left 06/08/2015   Procedure: Ascending Venogram  Iliac Vein ;  Surgeon: Natalie Dutch, MD;  Location: [facility] OR;   Service: Vascular;  Laterality: Left;  ;  Natalie Bond- [facility] / Natalie Bond-[place] HAVE BEEN NOTIFIED/ CONFIRMED  . PARTIAL HYSTERECTOMY    . SHOULDER SURGERY    . TOTAL HIP ARTHROPLASTY Left 10/21/2016   Procedure: LEFT TOTAL HIP ARTHROPLASTY ANTERIOR APPROACH;  Surgeon: Mcarthur Rossetti, MD;  [facility];  Service: Orthopedics;  Laterality: Left;  . WRIST SURGERY Bilateral    carpal tunnel   Social History   Occupational History  . 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 Smoking status: Never Smoker  . Smokeless tobacco: Never Used  Substance and Sexual Activity  .  Alcohol use: No  . Drug use: No  . Sexual activity: Not on file

## 2019-10-02 ENCOUNTER — Telehealth: Payer: Self-pay | Admitting: Orthopaedic Surgery

## 2019-10-02 NOTE — Telephone Encounter (Signed)
Called .patient will try to obtain a knee scooter but has no insurance

## 2019-10-02 NOTE — Telephone Encounter (Signed)
Patient called stated given crutches with cast on left leg and it's bothering her right hip.  Please call patient [phone]

## 2019-10-14 ENCOUNTER — Ambulatory Visit (INDEPENDENT_AMBULATORY_CARE_PROVIDER_SITE_OTHER): Payer: 59

## 2019-10-14 ENCOUNTER — Encounter: Payer: Self-pay | Admitting: Orthopaedic Surgery

## 2019-10-14 ENCOUNTER — Ambulatory Visit (INDEPENDENT_AMBULATORY_CARE_PROVIDER_SITE_OTHER): Payer: 59 | Admitting: Orthopaedic Surgery

## 2019-10-14 DIAGNOSIS — M25572 Pain in left ankle and joints of left foot: Secondary | ICD-10-CM | POA: Diagnosis not present

## 2019-10-14 NOTE — Progress Notes (Signed)
Office Visit Note   [name]           Date of Birth: [birth year]           MRN: [id number] Visit Date: 10/14/2019              Requested by: Leanna Battles, MD [place],  [place] [address] PCP: Leanna Battles, MD   Assessment & Plan: Visit Diagnoses:  1. Pain in left ankle and joints of left foot     Plan: I am going to have her go back to the cam walking boot with weightbearing as tolerated.  She needs to wear this boot for the next month.  This can take several months for this to resolve.  We will see her back in 2 months to see how she is doing overall.  If she still having problems with right hip pain I would like to have a low AP pelvis and lateral of her right hip if she is having pain in that hip.  We do not need to x-ray the ankle at the next visit.  Follow-Up Instructions: Return in about 2 months (around 12/14/2019).   Orders:  Orders Placed This Encounter  Procedures  . XR Ankle Complete Left   No orders of the defined types were placed in this encounter.     Procedures: No procedures performed   Clinical Data: No additional findings.   Subjective: Chief Complaint  Patient presents with  . Left Ankle - Follow-up  The patient is following up 2 weeks after we placed her in a short leg cast involving her left ankle.  She had an MRI showing significant edema in the lateral malleolus suggesting a stress fracture.  She was also having significant pain with weightbearing.  The MRI also showed some interstitial tearing of the peroneus longus and brevis tendons.  Her pain is still just been over the lateral malleolus.  It was appropriate to place her in a cast and put her on aspirin.  She says she is ready to go back to her boot because walk with crutches and nonweightbearing is been hard for her.  She has been developing some right hip pain.  She does have a history of a left total hip replacement that we did several years ago.  HPI   Review of Systems She currently denies any headache, chest pain, shortness of breath, fever, chills, nausea, vomiting  Objective: Vital Signs: There were no vitals taken for this visit.  Physical Exam She is alert and orient x3 in no acute distress Ortho Exam Examination of her left ankle out of cast still shows significant pain over the lateral malleolus.  There is pain with inversion of her left foot.  Her flexion extension is full.  There is no pain to palpation posterior to the lateral malleolus and the peroneal tendons do not subluxate. Specialty Comments:  No specialty comments available.  Imaging: Xr Ankle Complete Left  Result Date: 10/14/2019 3 views left ankle show no acute findings.  The ankle mortise is well located.    PMFS History: Patient Active Problem List   Diagnosis Date Noted  . TIA (transient ischemic attack) 05/29/2019  . Anterolisthesis 05/29/2019  . Thyroid nodule 05/29/2019  . 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 Right arm weakness 05/27/2019  . Type 2 diabetes mellitus without complication ([place]) 99991111  . Arthritis of carpometacarpal [facility]) joint of right thumb 04/16/2019  . Mixed conductive and sensorineural hearing loss of  left ear with restricted hearing of right ear 04/26/2018  . Vertigo 04/26/2018  . Venous stasis dermatitis of left lower extremity 04/09/2018  . Achilles tendon contracture, left 04/09/2018  . Pain in left ankle and joints of left foot 04/09/2018  . Primary osteoarthritis of first carpometacarpal joint of right hand 10/09/2017  . Bilateral hand pain 10/05/2017  . Primary osteoarthritis of both first carpometacarpal joints 10/05/2017  . Trochanteric bursitis, right hip 06/29/2017  . Degenerative disc disease at L5-S1 level 05/04/2017  . History of joint replacement 05/04/2017  . Claw toe, acquired, right 04/27/2017  . Achilles tendon contracture, right 04/27/2017  . Pain in right foot 04/11/2017  . Status post left hip  replacement 10/21/2016  . Unilateral primary osteoarthritis, left hip 09/23/2016    Class: Chronic  . Chronic back pain 01/14/2016  . Migraine 01/14/2016  . Essential hypertension 01/22/2015   Past Medical History:  Diagnosis Date  . Arthritis    back, wrists  . Cancer ([place])    skin cancer  . Complication of anesthesia   . Diabetes mellitus without complication ([place])    pt recently started on metformin  . GERD (gastroesophageal reflux disease)   . Headache   . High cholesterol   . History of bronchitis   . History of kidney stones    2015  . Hypertension   . PONV (postoperative nausea and vomiting)   . Sleep apnea    pt. does not use a CPAP or BiPAP  . Stroke ([place])    Pt. states possible mini stroke in 2014, no deficits    Family History  Problem Relation Age of Onset  . Cancer Mother   . Diabetes Mother   . Heart disease Mother   . Hyperlipidemia Mother   . Hypertension Mother   . Cancer Father   . Diabetes Father   . Heart disease Father   . Hyperlipidemia Father   . Hypertension Father   . Varicose Veins Father   . Cancer Sister   . Heart disease Sister   . Hyperlipidemia Sister   . Cancer Brother   . Diabetes Brother   . Hyperlipidemia Brother     Past Surgical History:  Procedure Laterality Date  . ABDOMINAL HYSTERECTOMY    . BACK SURGERY    . CARDIAC CATHETERIZATION     01/27/15 [facility]): EF 60%, normal coronaries.  Mortimer Fries SUSPENSION PLASTY Right 12/12/2018   Procedure: RIGHT THUMB CARPOMETACARPAL ([place]) ARTHROPLASTY;  Surgeon: Leandrew Koyanagi, MD;  Location: [place];  Service: Orthopedics;  Laterality: Right;  . CATARACT EXTRACTION, BILATERAL    . CHOLECYSTECTOMY  07/2016   [place]  . EYE SURGERY    . HERNIA REPAIR Left 1990   left groin   . LOWER EXTREMITY ANGIOGRAM Left 06/08/2015   Procedure: Ascending Venogram  Iliac Vein ;  Surgeon: Elam Dutch, MD;  Location: [facility] OR;  Service: Vascular;  Laterality: Left;   ;  TARA- [facility] [employer] / ZACH-[place] HAVE BEEN NOTIFIED/ CONFIRMED  . PARTIAL HYSTERECTOMY    . SHOULDER SURGERY    . TOTAL HIP ARTHROPLASTY Left 10/21/2016   Procedure: LEFT TOTAL HIP ARTHROPLASTY ANTERIOR APPROACH;  Surgeon: Mcarthur Rossetti, MD;  [facility];  Service: Orthopedics;  Laterality: Left;  . WRIST SURGERY Bilateral    carpal tunnel   Social History   Occupational History  . 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 Smoking status:  Never Smoker  . Smokeless tobacco: Never Used  Substance and Sexual Activity  . Alcohol use: No  . Drug use: No  . Sexual activity: Not on file

## 2019-10-30 ENCOUNTER — Emergency Department (HOSPITAL_COMMUNITY)
Admission: EM | Admit: 2019-10-30 | Discharge: 2019-10-30 | Disposition: A | Discharge status: Home / Self Care | Payer: Self-pay | Attending: Emergency Medicine | Admitting: Emergency Medicine

## 2019-10-30 ENCOUNTER — Emergency Department (HOSPITAL_COMMUNITY): Payer: Self-pay

## 2019-10-30 ENCOUNTER — Other Ambulatory Visit: Payer: Self-pay

## 2019-10-30 ENCOUNTER — Encounter (HOSPITAL_COMMUNITY): Payer: Self-pay | Admitting: Emergency Medicine

## 2019-10-30 DIAGNOSIS — M542 Cervicalgia: Secondary | ICD-10-CM | POA: Insufficient documentation

## 2019-10-30 DIAGNOSIS — Z7984 Long term (current) use of oral hypoglycemic drugs: Secondary | ICD-10-CM | POA: Insufficient documentation

## 2019-10-30 DIAGNOSIS — I10 Essential (primary) hypertension: Secondary | ICD-10-CM | POA: Insufficient documentation

## 2019-10-30 DIAGNOSIS — M25512 Pain in left shoulder: Secondary | ICD-10-CM | POA: Insufficient documentation

## 2019-10-30 DIAGNOSIS — Y999 Unspecified external cause status: Secondary | ICD-10-CM | POA: Insufficient documentation

## 2019-10-30 DIAGNOSIS — R519 Headache, unspecified: Secondary | ICD-10-CM | POA: Insufficient documentation

## 2019-10-30 DIAGNOSIS — S59901A Unspecified injury of right elbow, initial encounter: Secondary | ICD-10-CM

## 2019-10-30 DIAGNOSIS — Z7982 Long term (current) use of aspirin: Secondary | ICD-10-CM | POA: Insufficient documentation

## 2019-10-30 DIAGNOSIS — Z85828 Personal history of other malignant neoplasm of skin: Secondary | ICD-10-CM | POA: Insufficient documentation

## 2019-10-30 DIAGNOSIS — M25532 Pain in left wrist: Secondary | ICD-10-CM | POA: Insufficient documentation

## 2019-10-30 DIAGNOSIS — Z79899 Other long term (current) drug therapy: Secondary | ICD-10-CM | POA: Insufficient documentation

## 2019-10-30 DIAGNOSIS — S80211A Abrasion, right knee, initial encounter: Secondary | ICD-10-CM | POA: Insufficient documentation

## 2019-10-30 DIAGNOSIS — Z23 Encounter for immunization: Secondary | ICD-10-CM | POA: Insufficient documentation

## 2019-10-30 DIAGNOSIS — Y9301 Activity, walking, marching and hiking: Secondary | ICD-10-CM | POA: Insufficient documentation

## 2019-10-30 DIAGNOSIS — M25511 Pain in right shoulder: Secondary | ICD-10-CM | POA: Insufficient documentation

## 2019-10-30 DIAGNOSIS — W1830XA Fall on same level, unspecified, initial encounter: Secondary | ICD-10-CM | POA: Insufficient documentation

## 2019-10-30 DIAGNOSIS — W19XXXA Unspecified fall, initial encounter: Secondary | ICD-10-CM

## 2019-10-30 DIAGNOSIS — S0033XA Contusion of nose, initial encounter: Secondary | ICD-10-CM | POA: Insufficient documentation

## 2019-10-30 DIAGNOSIS — E119 Type 2 diabetes mellitus without complications: Secondary | ICD-10-CM | POA: Insufficient documentation

## 2019-10-30 DIAGNOSIS — S5001XA Contusion of right elbow, initial encounter: Secondary | ICD-10-CM | POA: Insufficient documentation

## 2019-10-30 DIAGNOSIS — Y929 Unspecified place or not applicable: Secondary | ICD-10-CM | POA: Insufficient documentation

## 2019-10-30 MED ORDER — ACETAMINOPHEN 500 MG PO TABS
1000.0000 mg | ORAL_TABLET | Freq: Once | ORAL | Status: AC
  Administered 2019-10-30: 1000 mg via ORAL
  Filled 2019-10-30: qty 2

## 2019-10-30 MED ORDER — TETANUS-DIPHTH-ACELL PERTUSSIS 5-2.5-18.5 LF-MCG/0.5 IM SUSP
0.5000 mL | Freq: Once | INTRAMUSCULAR | Status: AC
  Administered 2019-10-30: 20:00:00 0.5 mL via INTRAMUSCULAR
  Filled 2019-10-30: qty 0.5

## 2019-10-30 MED ORDER — OXYCODONE HCL 5 MG PO TABS
5.0000 mg | ORAL_TABLET | Freq: Once | ORAL | Status: AC
  Administered 2019-10-30: 5 mg via ORAL
  Filled 2019-10-30: qty 1

## 2019-10-30 MED ORDER — CYCLOBENZAPRINE HCL 10 MG PO TABS
5.0000 mg | ORAL_TABLET | Freq: Once | ORAL | Status: AC
  Administered 2019-10-30: 5 mg via ORAL
  Filled 2019-10-30: qty 1

## 2019-10-30 MED ORDER — HYDROCODONE-ACETAMINOPHEN 5-325 MG PO TABS: 1.0000 | ORAL_TABLET | Freq: Four times a day (QID) | ORAL | 0 refills | Status: DC | PRN

## 2019-10-30 NOTE — ED Provider Notes (Signed)
Signout from previous provider, Carmon Sails, PA-C at shift change See previous providers note for full H&P  Briefly, patient presents for fall on outstretched hand after tripping over a cement parking stop.  Imaging is pending at shift change.  Tetanus updated.  CT head, C-spine, maxillofacial are negative.  X-rays of the right and left shoulder as well as left wrist and knee are negative.  However, there is a subtle radial head fracture in the right elbow.  Patient also has tenderness in the left anatomical snuffbox.  Will place Velcro thumb spica on the left and long-arm posterior with sling on the right.  Will discharge home with Vicodin short course for pain control.  Ice discussed.  Follow-up to patient's orthopedist, Dr. Ninfa Linden, for further evaluation and treatment of her injuries.  Return precautions discussed.  Patient understands and agrees with plan.  Patient vitals stable throughout ED course and discharged in satisfactory condition.  SPLINT APPLICATION Date/Time: 0000000 PM Authorized by: Frederica Kuster Consent: Verbal consent obtained. Risks and benefits: risks, benefits and alternatives were discussed Consent given by: patient Splint applied by: orthopedic technician Location details: RUE Splint type: Long-arm posterior Supplies used: Ortho-Glass, Ace wrap Post-procedure: The splinted body part was neurovascularly unchanged following the procedure. Patient tolerance: Patient tolerated the procedure well with no immediate complications.       Frederica Kuster, PA-C 10/30/19 2253    Drenda Freeze, MD 11/02/19 [phone]

## 2019-10-30 NOTE — ED Triage Notes (Signed)
Pt arrives via POV from home with trip and fall over concrete parking block. Neg LOC. VSS. Pt alert, oriented x4. Pt reports pain to face, head, elbows and right knee.

## 2019-10-30 NOTE — Discharge Instructions (Addendum)
Take Vicodin every 6 hours as needed for severe pain.  Use ice 3-4 times daily alternating 20 minutes on, 20 minutes off.  Wear splint on your right arm at all times.  Cover when bathing.  Wear splint on the left wrist at all times except when bathing.  Please follow-up with Dr. Ninfa Linden for further evaluation and treatment of your injuries.  Please return emergency department if you develop any new or worsening symptoms.  Do not drink alcohol, drive, operate machinery or participate in any other potentially dangerous activities while taking opiate pain medication as it may make you sleepy. Do not take this medication with any other sedating medications, either prescription or over-the-counter. If you were prescribed Percocet or Vicodin, do not take these with acetaminophen (Tylenol) as it is already contained within these medications and overdose of Tylenol is dangerous.   This medication is an opiate (or narcotic) pain medication and can be habit forming.  Use it as little as possible to achieve adequate pain control.  Do not use or use it with extreme caution if you have a history of opiate abuse or dependence. This medication is intended for your use only - do not give any to anyone else and keep it in a secure place where nobody else, especially children, have access to it. It will also cause or worsen constipation, so you may want to consider taking an over-the-counter stool softener while you are taking this medication.

## 2019-10-30 NOTE — ED Provider Notes (Signed)
[facility] EMERGENCY DEPARTMENT Provider Note   CSN: [id number] Arrival date & time: 10/30/19  1643     History   Chief Complaint Chief Complaint  Patient presents with  . Fall    HPI Natalie Bond is a 63 y.o. female brought to the ER by POV and husband for evaluation of injury sustained after a fall earlier today around 4 PM.  Patient has been walking out of a store.  Husband witnessed the fall and states patient turned around and tripped over handicap ramp falling forward mostly landing on her face, he is not sure if he braced his fall with his hands.  There was no loss of consciousness.  Patient states for several seconds she could only see "white", this spontaneously resolved.  She has developed constant, moderate, gradual pain and "soreness" in bilateral shoulders, right elbow, left hand and wrist, right knee.  Has been ambulatory since.  She has an abrasion on her nose and has noticed pain and frontal facial pain and mild frontal headache.  No interventions.  Patient states the pain in her right elbow hand shoulders are worse when she tries to use her hands, movement.  No anticoagulants.  Denies vision changes, vomiting, seizure, chest pain, shortness of breath, abdominal pain or any other injuries.  No one-sided weakness, loss of sensation.  Unknown tetanus status. H/o left hip surgery, right CMC surgery and left shoulder surgery, chronic back pain.      HPI  Past Medical History:  Diagnosis Date  . Arthritis    back, wrists  . Cancer ([place])    skin cancer  . Complication of anesthesia   . Diabetes mellitus without complication ([place])    pt recently started on metformin  . GERD (gastroesophageal reflux disease)   . Headache   . High cholesterol   . History of bronchitis   . History of kidney stones    2015  . Hypertension   . PONV (postoperative nausea and vomiting)   . Sleep apnea    pt. does not use a CPAP or BiPAP  . Stroke ([place])    Pt. states  possible mini stroke in 2014, no deficits    Patient Active Problem List   Diagnosis Date Noted  . TIA (transient ischemic attack) 05/29/2019  . Anterolisthesis 05/29/2019  . Thyroid nodule 05/29/2019  . 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 Right arm weakness 05/27/2019  . Type 2 diabetes mellitus without complication ([place]) 99991111  . Arthritis of carpometacarpal [facility]) joint of right thumb 04/16/2019  . Mixed conductive and sensorineural hearing loss of left ear with restricted hearing of right ear 04/26/2018  . Vertigo 04/26/2018  . Venous stasis dermatitis of left lower extremity 04/09/2018  . Achilles tendon contracture, left 04/09/2018  . Pain in left ankle and joints of left foot 04/09/2018  . Primary osteoarthritis of first carpometacarpal joint of right hand 10/09/2017  . Bilateral hand pain 10/05/2017  . Primary osteoarthritis of both first carpometacarpal joints 10/05/2017  . Trochanteric bursitis, right hip 06/29/2017  . Degenerative disc disease at L5-S1 level 05/04/2017  . History of joint replacement 05/04/2017  . Claw toe, acquired, right 04/27/2017  . Achilles tendon contracture, right 04/27/2017  . Pain in right foot 04/11/2017  . Status post left hip replacement 10/21/2016  . Unilateral primary osteoarthritis, left hip 09/23/2016    Class: Chronic  . Chronic back pain 01/14/2016  . Migraine 01/14/2016  . Essential hypertension 01/22/2015    Past  Surgical History:  Procedure Laterality Date  . ABDOMINAL HYSTERECTOMY    . BACK SURGERY    . CARDIAC CATHETERIZATION     01/27/15 [facility]): EF 60%, normal coronaries.  Mortimer Fries SUSPENSION PLASTY Right 12/12/2018   Procedure: RIGHT THUMB CARPOMETACARPAL ([place]) ARTHROPLASTY;  Surgeon: Leandrew Koyanagi, MD;  Location: [facility];  Service: Orthopedics;  Laterality: Right;  . CATARACT EXTRACTION, BILATERAL    . CHOLECYSTECTOMY  07/2016   done [place]  . EYE SURGERY    . HERNIA REPAIR Left  1990   left groin   . LOWER EXTREMITY ANGIOGRAM Left 06/08/2015   Procedure: Ascending Venogram  Iliac Vein ;  Surgeon: Elam Dutch, MD;  Location[facility];  Service: Vascular;  Laterality: Left;  ;  [facility] [employer] / [place] HAVE BEEN NOTIFIED/ CONFIRMED  . PARTIAL HYSTERECTOMY    . SHOULDER SURGERY    . TOTAL HIP ARTHROPLASTY Left 10/21/2016   Procedure: LEFT TOTAL HIP ARTHROPLASTY ANTERIOR APPROACH;  Surgeon: Mcarthur Rossetti, MD;  Location: [facility] ORS;  Service: Orthopedics;  Laterality: Left;  . WRIST SURGERY Bilateral    carpal tunnel     OB History   No obstetric history on file.      Home Medications    Prior to Admission medications   Medication Sig Start Date End Date Taking? Authorizing Provider  acetaminophen-codeine (TYLENOL #3) 300-30 MG tablet Take 1-2 tablets by mouth every 8 (eight) hours as needed for moderate pain. 08/29/19   Mcarthur Rossetti, MD  amitriptyline (ELAVIL) 25 MG tablet Take 1 tablet (25 mg total) by mouth at bedtime. 03/14/19   Leandrew Koyanagi, MD  aspirin 81 MG chewable tablet Chew 1 tablet (81 mg total) by mouth 2 (two) times daily. 10/23/16   Mcarthur Rossetti, MD  atorvastatin (LIPITOR) 20 MG tablet Take 1 tablet (20 mg total) by mouth daily at 6 PM. 05/29/19   Black, Lezlie Octave, NP  butalbital-acetaminophen-caffeine (FIORICET) 50-325-40 MG tablet Take 1 tablet by mouth every 12 (twelve) hours as needed for headache. 05/29/19   Black, Lezlie Octave, NP  cloNIDine (CATAPRES) 0.1 MG tablet Take 0.1 mg by mouth 2 (two) times daily as needed (Blood pressure is over 160). If BP is over 160    [provider]  cyclobenzaprine (FLEXERIL) 10 MG tablet Take 10 mg by mouth 3 (three) times daily as needed for muscle spasms.  04/16/19   [provider]  gabapentin (NEURONTIN) 100 MG capsule Take 1 capsule (100 mg total) by mouth at bedtime. 05/04/17   Jessy Oto, MD  ibuprofen (ADVIL,MOTRIN) 800 MG tablet Take 1 tablet (800 mg  total) by mouth every 8 (eight) hours as needed. 01/09/19   Aundra Dubin, PA-C  magnesium 30 MG tablet Take 30 mg by mouth 2 (two) times daily.    [provider]  metFORMIN (GLUCOPHAGE-XR) 500 MG 24 hr tablet Take 500 mg by mouth daily. 04/19/19   [provider]  methylPREDNISolone (MEDROL) 4 MG tablet Medrol dose pack. Take as instructed 08/29/19   Mcarthur Rossetti, MD  omeprazole (PRILOSEC) 20 MG capsule Take 20 mg by mouth daily.    [provider]  ondansetron (ZOFRAN) 4 MG tablet Take 1-2 tablets (4-8 mg total) by mouth every 8 (eight) hours as needed for nausea or vomiting. 12/12/18   Leandrew Koyanagi, MD  Potassium 75 MG TABS Take 75 mg by mouth daily.     [provider]  telmisartan-hydrochlorothiazide (MICARDIS HCT) 80-12.5 MG tablet Take 1 tablet by mouth daily. 03/18/19   [provider]  topiramate (TOPAMAX) 25 MG tablet Take 25 mg by mouth at bedtime. 08/08/16   [provider]  traMADol (ULTRAM) 50 MG tablet Take 1 tablet (50 mg total) by mouth 3 (three) times daily as needed. Patient taking differently: Take 50 mg by mouth 3 (three) times daily as needed for moderate pain.  12/27/18   Leandrew Koyanagi, MD    Family History Family History  Problem Relation Age of Onset  . Cancer Mother   . Diabetes Mother   . Heart disease Mother   . Hyperlipidemia Mother   . Hypertension Mother   . Cancer Father   . Diabetes Father   . Heart disease Father   . Hyperlipidemia Father   . Hypertension Father   . Varicose Veins Father   . Cancer Sister   . Heart disease Sister   . Hyperlipidemia Sister   . Cancer Brother   . Diabetes Brother   . Hyperlipidemia Brother     Social History Social History   Tobacco Use  . Smoking status: Never Smoker  . Smokeless tobacco: Never Used  Substance Use Topics  . Alcohol use: No  . Drug use: No     Allergies   Dilaudid [hydromorphone hcl] and Penicillins   Review of Systems  Review of Systems  HENT: Positive for facial swelling (and pain).   Musculoskeletal: Positive for arthralgias and myalgias.  Skin: Positive for wound.  Neurological: Positive for headaches.  All other systems reviewed and are negative.    Physical Exam Updated Vital Signs BP 136/75 (BP Location: Right Arm)   Pulse 62   Temp 97.6 F (36.4 C) (Oral)   Resp 18   SpO2 100%   Physical Exam Constitutional:      General: She is not in acute distress.    Appearance: She is well-developed.  HENT:     Head:     Comments: Abrasion, focal contusion/ecchymosis over bridge of nose locally tender. No obvious nasal deviation.  No other facial, scalp bone tenderness.     Ears:     Comments: No hemotympanum. No Battle's sign.    Nose:     Comments: No intranasal bleeding or rhinorrhea. Septum midline. No septal hematoma     Mouth/Throat:     Comments: No intraoral bleeding or injury. No malocclusion. MMM. Dentition appears stable.  Eyes:     Conjunctiva/sclera: Conjunctivae normal.     Comments: Lids normal. EOMs and PERRL intact. No racoon's eyes   Neck:     Musculoskeletal: Muscular tenderness present.     Comments: C-spine: diffuse posterior neck tenderness, with even light touch including TTP at midline and paraspinal muscular tenderness. However patient noted to be actively/spontaneously moving neck during encounter without obvious deficit.  Active ROM of cervical spine with pain with neck rotation. Trachea midline Cardiovascular:     Rate and Rhythm: Normal rate and regular rhythm.     Pulses:          Radial pulses are 1+ on the right side and 1+ on the left side.       Dorsalis pedis pulses are 1+ on the right side and 1+ on the left side.     Heart sounds: Normal heart sounds, S1 normal and S2 normal.  Pulmonary:     Effort: Pulmonary effort is normal.     Breath sounds: Normal breath sounds. No  decreased breath sounds.     Comments: No anterior/lateral chest wall tenderness   Musculoskeletal: Normal range of motion.        General: No deformity.     Right shoulder: She exhibits tenderness.     Left shoulder: She exhibits tenderness.     Right elbow: Tenderness found.     Left wrist: She exhibits tenderness.     Right knee: Tenderness found.     Comments: T-spine: no paraspinal muscular tenderness or midline tenderness.   L-spine: no paraspinal muscular or midline tenderness.  Pelvis: no instability with AP/L compression, leg shortening or rotation. Full PROM of hips bilaterally without pain. Negative SLR bilaterally.   Patient jumps away during exam and with initial palpation of all affected joints even with light touch however with encouragement mostly reports "soreness" throughout.  R shoulder: diffuse posterior deltoid tenderness, no focal bony tenderness to scapula, [place]/AC joint, clavicle, anterior chest. Full ROM (with encouragement) with pain with cross over, abduction. No crepitus with ROM. No obvious dislocation.  L shoulder: tenderness mostly anterior shoulder over biceps groove, no focal bony tenderness to scapula, [place]/AC joint, clavicle, anterior chest. Full ROM (with encouragement) with pain with cross over, abduction. No crepitus with ROM.   R elbow: focal tenderness to medial elbow and medial AC fossa, normal skin over the joint.  Full flexion/extension and supination with reported pain mostly medially/AC fossa.  Compartments of upper/lower arm soft. No crepitus with ROM.   R hand/wrist: no focal bony tenderness, full ROM without pain.    L wrist: focal edema, ecchymosis and tenderness to base of thumb, scaphoid, distal radius with decreased ROM.  +Scaphoid compression test.  Full ROM of digits without pain.    R knee: small abrasion over patella with local tenderness, otherwise no focal bony tenderness over knee, medial/lateral joint lines, quad/patellar tendons, popliteal fossa. Full ROM without pain    Skin:    General: Skin is warm and dry.      Capillary Refill: Capillary refill takes less than 2 seconds.     Findings: Abrasion and bruising present.     Comments: Linear, small abrasions and contusion over nasal bridge as above, left palm, right distal forearm and right knee   Neurological:     Mental Status: She is alert, oriented to person, place, and time and easily aroused.     Comments: Speech is fluent without obvious dysarthria or dysphasia. Strength 5/5 with hand grip and ankle F/E.   Sensation to light touch intact in hands and feet.  CN II-XII grossly intact bilaterally.   Psychiatric:        Behavior: Behavior normal. Behavior is cooperative.        Thought Content: Thought content normal.      ED Treatments / Results  Labs (all labs ordered are listed, but only abnormal results are displayed) Labs Reviewed - No data to display  EKG None  Radiology No results found.  Procedures Procedures (including critical care time)  Medications Ordered in ED Medications  cyclobenzaprine (FLEXERIL) tablet 5 mg (has no administration in time range)  acetaminophen (TYLENOL) tablet 1,000 mg (has no administration in time range)  Tdap (BOOSTRIX) injection 0.5 mL (has no administration in time range)     Initial Impression / Assessment and Plan / ED Course  I have reviewed the triage vital signs and the nursing notes.  Pertinent labs & imaging results that were available during my care of the patient were reviewed by me  and considered in my medical decision making (see chart for details).  63 y.o. yo female here after mechanical fall.  Reports pain and "soreness" to several area including nose, frontal face, bilateral shoulder, right elbow, left wrist/hand, right knee.   She is very jumpy during exam but with encouragement exam is reassuring.  No signs to suggest unstable fractures, dislocations, significant facial/head cervical injuries.  Given distracting injuries and age, will obtain respective imaging.  Mostly  concern about left wrist and scaphoid tenderness, contusion.  No signs of chest, abdominal, TL spine, pelvis trauma.    Tylenol, flexeril, tetanus.    1850: Pt will be handed off to oncoming EDPA who will f/u on imaging.  Anticipate discharge with symptomatic tx including muscle relaxer, NSAID, ice, rest.  Given left wrist pain/scaphoid tenderness would dc with thumb spica even if negative x-rays and hand f/u.  She f/u with Dr Ninfa Linden from previous right wrist/hand surgery.  Discussed this with patient and husband who understand possible occult scaphoid injury.  Several rx for narcotic pain medicines by different providers likely for chronic pain.  Last rx one month ago.   Final Clinical Impressions(s) / ED Diagnoses   Final diagnoses:  Fall, initial encounter  Contusion of nose, initial encounter  Left wrist pain  Acute pain of both shoulders  Abrasion of right knee, initial encounter  Soft tissue injury of right elbow, initial encounter    ED Discharge Orders    None       Arlean Hopping 10/30/19 1856    Davonna Belling, MD 10/30/19 2239

## 2019-10-30 NOTE — Progress Notes (Signed)
Orthopedic Tech Progress Note Patient Details:  Natalie Bond 11-16-1956 [id number]  Ortho Devices Type of Ortho Device: Thumb velcro splint, Long arm splint, Arm sling Ortho Device/Splint Interventions: Adjustment, Application, Ordered   Post Interventions Patient Tolerated: Well Instructions Provided: Care of device, Adjustment of device   Melony Overly T 10/30/2019, 10:37 PM

## 2019-10-31 ENCOUNTER — Encounter (HOSPITAL_BASED_OUTPATIENT_CLINIC_OR_DEPARTMENT_OTHER): Payer: Self-pay | Admitting: Orthopedic Surgery

## 2019-10-31 ENCOUNTER — Encounter: Payer: Self-pay | Admitting: Orthopaedic Surgery

## 2019-10-31 ENCOUNTER — Telehealth: Payer: Self-pay | Admitting: Orthopaedic Surgery

## 2019-10-31 ENCOUNTER — Other Ambulatory Visit: Payer: Self-pay

## 2019-10-31 ENCOUNTER — Ambulatory Visit (INDEPENDENT_AMBULATORY_CARE_PROVIDER_SITE_OTHER): Payer: Self-pay | Admitting: Orthopaedic Surgery

## 2019-10-31 DIAGNOSIS — S52124A Nondisplaced fracture of head of right radius, initial encounter for closed fracture: Secondary | ICD-10-CM

## 2019-10-31 DIAGNOSIS — S63502A Unspecified sprain of left wrist, initial encounter: Secondary | ICD-10-CM

## 2019-10-31 MED ORDER — OXYCODONE HCL 5 MG PO TABS: 5.0000 mg | ORAL_TABLET | ORAL | 0 refills | Status: DC | PRN

## 2019-10-31 MED ORDER — TIZANIDINE HCL 4 MG PO TABS: 4.0000 mg | ORAL_TABLET | Freq: Three times a day (TID) | ORAL | 1 refills | Status: DC | PRN

## 2019-10-31 MED ORDER — ONDANSETRON 4 MG PO TBDP: 4.0000 mg | ORAL_TABLET | Freq: Three times a day (TID) | ORAL | 0 refills | Status: DC | PRN

## 2019-10-31 NOTE — Telephone Encounter (Signed)
Patient called left vm and stated broke elbow.Called pt back 1st time recording vm not set up 2Nd attempt N/A

## 2019-10-31 NOTE — Progress Notes (Signed)
Office Visit Note   [name]           Date of Birth: 09/29/1956           MRN: [id number] Visit Date: 10/31/2019              Requested by: Leanna Battles, MD [place],  [place] [address] PCP: Leanna Battles, MD   Assessment & Plan: Visit Diagnoses:  1. Closed nondisplaced fracture of head of right radius, initial encounter   2. Sprain of left wrist, initial encounter     Plan: I am going to send in some oxycodone and Zanaflex for her for pain and spasms.  She can still take anti-inflammatories as needed as well.  I would like to see her back around 14 December to have the splint removed from her right elbow.  I would like 3 views of her right elbow at that visit.  We can also have an AP and lateral of the left wrist at that visit.  All question concerns were answered and addressed.  Follow-Up Instructions: Return in about 12 days (around 11/12/2019).     Orders:  No orders of the defined types were placed in this encounter.  Meds ordered this encounter  Medications   oxyCODONE (ROXICODONE) 5 MG immediate release tablet    Sig: Take 1-2 tablets (5-10 mg total) by mouth every 4 (four) hours as needed for severe pain.    Dispense:  30 tablet    Refill:  0   tiZANidine (ZANAFLEX) 4 MG tablet    Sig: Take 1 tablet (4 mg total) by mouth every 8 (eight) hours as needed for muscle spasms.    Dispense:  40 tablet    Refill:  1   ondansetron (ZOFRAN ODT) 4 MG disintegrating tablet    Sig: Take 1 tablet (4 mg total) by mouth every 8 (eight) hours as needed for nausea or vomiting.    Dispense:  20 tablet    Refill:  0      Procedures: No procedures performed   Clinical Data: No additional findings.   Subjective: Chief Complaint  Patient presents with   Right Elbow - Pain, Injury   Left Wrist - Injury, Pain  The patient is actually well-known patient of mine that have seen recently.  I have been dealing with treating left foot and  ankle pain and she had been in a warm cam walking boot.  I actually saw her a few weeks ago.  She had a hard fall in a parking lot last evening.  She was seen at the emergency room and found to have a right elbow nondisplaced radial head fracture for which she is placed in a posterior splint and a sling.  She also had significant left wrist pain and was placed in a Velcro removable wrist splint.  She also sustained contusions to her face.  She sustained a contusion to her right knee and does report bilateral shoulder pain.  She does report significant pain all over due to the nature of her fall and how hard she fell.  She denies any numbness and tingling in her hands.  HPI  Review of Systems She currently denies any chest pain or shortness of breath.  She does have a headache.  She denies any fever, chills, nausea, vomiting  Objective: Vital Signs: There were no vitals taken for this visit.  Physical Exam She is alert and orient x3 and in no acute distress but  obvious discomfort Ortho Exam There is a well-padded posterior splint with an Ace wrap on her right upper extremity.  Her fingers are well-perfused.  I did not take the splint off because it feels like it is well-padded.  She is able to move her fingers and thumb.  Examination of her left wrist shows some pain with palmar flexion dorsiflexion of the wrist.  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  She is actually had a CMC arthroplasty on the right side before.  Her right knee has some bruising but no effusion with good range of motion.  It is difficult for her to lift both of her shoulders above her head and I did not have her try it that much on the right side due to the splint that she has. Specialty Comments:  No specialty comments available.  Imaging: Dg Shoulder Right  Result Date: 10/30/2019 CLINICAL DATA:  63 year old female status post fall today with  pain. EXAM: RIGHT SHOULDER - 2+ VIEW COMPARISON:  Chest radiographs 05/27/2019. FINDINGS: Bone mineralization is within normal limits for age. No glenohumeral joint dislocation. Proximal right humerus appears intact. Chronic small ossific fragments at the right [facility] joint appears stable. Right clavicle and scapula appear intact. Negative visible right ribs and chest. IMPRESSION: No acute fracture or dislocation identified about the right shoulder. Electronically Signed   By: Genevie Ann M.D.   On: 10/30/2019 19:19   Dg Elbow Complete Right  Result Date: 10/30/2019 CLINICAL DATA:  63 year old female status post fall today with pain. EXAM: RIGHT ELBOW - COMPLETE 3+ VIEW COMPARISON:  Right shoulder series today. FINDINGS: Positive joint effusion. Subtle cortical irregularity and suggestion of mild impaction at the right radial head/neck (arrow). Preserved joint spaces and alignment. Distal humerus and proximal ulna appear intact. IMPRESSION: 1. Subtle fracture of the right proximal radius head/neck. 2. Positive right elbow joint effusion, hemarthrosis. Electronically Signed   By: Genevie Ann M.D.   On: 10/30/2019 19:20   Dg Wrist Complete Left  Result Date: 10/30/2019 CLINICAL DATA:  63 year old female status post fall today with pain. EXAM: LEFT WRIST - COMPLETE 3+ VIEW COMPARISON:  Left hand series 10/05/2017. FINDINGS: Chronic severe 1st CMC and radial aspect distal carpal row degeneration. Bulky osteophytosis and fragmentation has mildly progressed there since 2018. Elsewhere carpal bone alignment and joint spaces appear normal. Distal left radius and ulna appear intact. No acute osseous abnormality identified. IMPRESSION: 1. No acute fracture or dislocation identified about the left wrist. 2. 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 Electronically Signed   By: Genevie Ann M.D.   On: 10/30/2019 19:22   Ct Head Wo Contrast  Result Date: 10/30/2019 CLINICAL DATA:  Facial trauma and neck pain after fall. No loss of  consciousness. EXAM: CT HEAD WITHOUT CONTRAST CT MAXILLOFACIAL WITHOUT CONTRAST CT CERVICAL SPINE WITHOUT CONTRAST TECHNIQUE: Multidetector CT imaging of the head, cervical spine, and maxillofacial structures were performed using the standard protocol without intravenous contrast. Multiplanar CT image reconstructions of the cervical spine and maxillofacial structures were also generated. COMPARISON:  May 27, 2019. FINDINGS: CT HEAD FINDINGS Brain: No evidence of acute infarction, hemorrhage, hydrocephalus, extra-axial collection or mass lesion/mass effect. Vascular: No hyperdense vessel or unexpected calcification. Skull: Normal. Negative for fracture or focal lesion. Other: None. CT MAXILLOFACIAL FINDINGS Osseous: No fracture or mandibular dislocation. No destructive process. Orbits: Negative. No traumatic or inflammatory finding. Sinuses: Clear. Soft  tissues: Negative. CT CERVICAL SPINE FINDINGS Alignment: Minimal grade 1 anterolisthesis of C5-6 is noted secondary to posterior facet joint hypertrophy. Skull base and vertebrae: No acute fracture. No primary bone lesion or focal pathologic process. Soft tissues and spinal canal: No prevertebral fluid or swelling. No visible canal hematoma. Disc levels:  Mild degenerative disc disease is noted at C6-7. Upper chest: Negative. Other: Degenerative changes are seen involving posterior facet joints bilaterally. IMPRESSION: 1. Normal head CT. 2. No abnormality seen in maxillofacial region. 3. Multilevel degenerative disc disease is noted in the cervical spine. No fracture or spondylolisthesis is noted. Electronically Signed   By: Marijo Conception M.D.   On: 10/30/2019 20:10   Ct Cervical Spine Wo Contrast  Result Date: 10/30/2019 CLINICAL DATA:  Facial trauma and neck pain after fall. No loss of consciousness. EXAM: CT HEAD WITHOUT CONTRAST CT MAXILLOFACIAL WITHOUT CONTRAST CT CERVICAL SPINE WITHOUT CONTRAST TECHNIQUE: Multidetector CT imaging of the head, cervical  spine, and maxillofacial structures were performed using the standard protocol without intravenous contrast. Multiplanar CT image reconstructions of the cervical spine and maxillofacial structures were also generated. COMPARISON:  May 27, 2019. FINDINGS: CT HEAD FINDINGS Brain: No evidence of acute infarction, hemorrhage, hydrocephalus, extra-axial collection or mass lesion/mass effect. Vascular: No hyperdense vessel or unexpected calcification. Skull: Normal. Negative for fracture or focal lesion. Other: None. CT MAXILLOFACIAL FINDINGS Osseous: No fracture or mandibular dislocation. No destructive process. Orbits: Negative. No traumatic or inflammatory finding. Sinuses: Clear. Soft tissues: Negative. CT CERVICAL SPINE FINDINGS Alignment: Minimal grade 1 anterolisthesis of C5-6 is noted secondary to posterior facet joint hypertrophy. Skull base and vertebrae: No acute fracture. No primary bone lesion or focal pathologic process. Soft tissues and spinal canal: No prevertebral fluid or swelling. No visible canal hematoma. Disc levels:  Mild degenerative disc disease is noted at C6-7. Upper chest: Negative. Other: Degenerative changes are seen involving posterior facet joints bilaterally. IMPRESSION: 1. Normal head CT. 2. No abnormality seen in maxillofacial region. 3. Multilevel degenerative disc disease is noted in the cervical spine. No fracture or spondylolisthesis is noted. Electronically Signed   By: Marijo Conception M.D.   On: 10/30/2019 20:10   Dg Shoulder Left  Result Date: 10/30/2019 CLINICAL DATA:  63 year old female with fall and trauma to the left upper extremity. EXAM: LEFT SHOULDER - 2+ VIEW COMPARISON:  None. FINDINGS: There is no acute fracture or dislocation. The bones are osteopenic. Minimal degenerative changes of the left shoulder with minimal narrowing of the glenohumeral joint. The soft tissues are unremarkable. IMPRESSION: No acute fracture or dislocation. Electronically Signed   By: Anner Crete M.D.   On: 10/30/2019 19:21   Dg Knee Complete 4 Views Right  Result Date: 10/30/2019 CLINICAL DATA:  Post fall landing on face, wrist and knees. Right knee pain. EXAM: RIGHT KNEE - COMPLETE 4+ VIEW COMPARISON:  None. FINDINGS: No evidence of fracture, dislocation, or joint effusion. No evidence of arthropathy or other focal bone abnormality. Soft tissues are unremarkable. IMPRESSION: Negative radiographs of the right knee. Electronically Signed   By: Keith Rake M.D.   On: 10/30/2019 19:24   Ct Maxillofacial Wo Contrast  Result Date: 10/30/2019 CLINICAL DATA:  Facial trauma and neck pain after fall. No loss of consciousness. EXAM: CT HEAD WITHOUT CONTRAST CT MAXILLOFACIAL WITHOUT CONTRAST CT CERVICAL SPINE WITHOUT CONTRAST TECHNIQUE: Multidetector CT imaging of the head, cervical spine, and maxillofacial structures were performed using the standard protocol without intravenous contrast. Multiplanar CT image reconstructions of  the cervical spine and maxillofacial structures were also generated. COMPARISON:  May 27, 2019. FINDINGS: CT HEAD FINDINGS Brain: No evidence of acute infarction, hemorrhage, hydrocephalus, extra-axial collection or mass lesion/mass effect. Vascular: No hyperdense vessel or unexpected calcification. Skull: Normal. Negative for fracture or focal lesion. Other: None. CT MAXILLOFACIAL FINDINGS Osseous: No fracture or mandibular dislocation. No destructive process. Orbits: Negative. No traumatic or inflammatory finding. Sinuses: Clear. Soft tissues: Negative. CT CERVICAL SPINE FINDINGS Alignment: Minimal grade 1 anterolisthesis of C5-6 is noted secondary to posterior facet joint hypertrophy. Skull base and vertebrae: No acute fracture. No primary bone lesion or focal pathologic process. Soft tissues and spinal canal: No prevertebral fluid or swelling. No visible canal hematoma. Disc levels:  Mild degenerative disc disease is noted at C6-7. Upper chest: Negative. Other:  Degenerative changes are seen involving posterior facet joints bilaterally. IMPRESSION: 1. Normal head CT. 2. No abnormality seen in maxillofacial region. 3. Multilevel degenerative disc disease is noted in the cervical spine. No fracture or spondylolisthesis is noted. Electronically Signed   By: Marijo Conception M.D.   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   Right arm weakness 05/27/2019   Type 2 diabetes mellitus without complication ([place]) 99991111   Arthritis of carpometacarpal (CMC) joint of right thumb 04/16/2019   Mixed conductive and sensorineural hearing loss of left ear with restricted hearing of right ear 04/26/2018   Vertigo 04/26/2018   Venous stasis dermatitis of left lower extremity 04/09/2018   Achilles tendon contracture, left 04/09/2018   Pain in left ankle and joints of left foot 04/09/2018   Primary osteoarthritis of first carpometacarpal joint of right hand 10/09/2017   Bilateral hand pain 10/05/2017   Primary osteoarthritis of both first carpometacarpal joints 10/05/2017   Trochanteric bursitis, right hip 06/29/2017   Degenerative disc disease at L5-S1 level 05/04/2017   History of joint replacement 05/04/2017   Claw toe, acquired, right 04/27/2017   Achilles tendon contracture, right 04/27/2017   Pain in right foot 04/11/2017   Status post left hip replacement 10/21/2016   Unilateral primary osteoarthritis, left hip 09/23/2016    Class: Chronic   Chronic back pain 01/14/2016   Migraine 01/14/2016   Essential hypertension 01/22/2015   Past Medical History:  Diagnosis Date   Arthritis    Arthritis    back, wrists   Cancer ([place])    skin cancer   Carpal tunnel syndrome    Complication of anesthesia    DDD (degenerative disc disease)      Diabetes mellitus without complication ([place])    pt recently started on metformin   Dysrhythmia 1995   irreg hr   Full dentures    GERD (gastroesophageal reflux disease)    Headache    High cholesterol    History of bronchitis    History of kidney stones    2015   Hypertension    PONV (postoperative nausea and vomiting)    Sleep apnea    pt. does not use a CPAP or BiPAP   Stroke ([place])    Pt. states possible mini stroke in 2014, no deficits    Family History  Problem Relation Age of Onset   Cancer Mother    Diabetes Mother    Heart disease Mother    Hyperlipidemia Mother  Hypertension Mother    Cancer Father    Diabetes Father    Heart disease Father    Hyperlipidemia Father    Hypertension Father    Varicose Veins Father    Cancer Sister    Heart disease Sister    Hyperlipidemia Sister    Cancer Brother    Diabetes Brother    Hyperlipidemia Brother     Past Surgical History:  Procedure Laterality Date   ABDOMINAL HYSTERECTOMY     BACK SURGERY  2004   [id number]   BACK SURGERY     CARDIAC CATHETERIZATION     01/27/15 [facility]): EF 60%, normal coronaries.   CARPAL TUNNEL RELEASE  09/20/2012   Procedure: CARPAL TUNNEL RELEASE;  Surgeon: Cammie Sickle., MD;  Location: [place];  Service: Orthopedics;  Laterality: Left;   CARPOMETACARPEL SUSPENSION PLASTY Right 12/12/2018   Procedure: RIGHT THUMB CARPOMETACARPAL [facility]) ARTHROPLASTY;  Surgeon: Leandrew Koyanagi, MD;  Location: [place];  Service: Orthopedics;  Laterality: Right;   CATARACT EXTRACTION, BILATERAL     CHOLECYSTECTOMY  07/2016   done [place]   DORSAL COMPARTMENT RELEASE  09/20/2012   Procedure: RELEASE DORSAL COMPARTMENT (DEQUERVAIN);  Surgeon: Cammie Sickle., MD;  Location: [facility];  Service: Orthopedics;  Laterality: Left;   EYE SURGERY     bilat cataracts   EYE SURGERY     HERNIA REPAIR Left 1990    left groin    LOWER EXTREMITY ANGIOGRAM Left 06/08/2015   Procedure: Ascending Venogram  Iliac Vein ;  Surgeon: Elam Dutch, MD;  Location: [facility] OR;  Service: Vascular;  Laterality: Left;  ;  TARA- [facility] / ZACH-[place] HAVE BEEN NOTIFIED/ CONFIRMED   PARTIAL HYSTERECTOMY     SHOULDER SURGERY     TOTAL HIP ARTHROPLASTY Left 10/21/2016   Procedure: LEFT TOTAL HIP ARTHROPLASTY ANTERIOR APPROACH;  Surgeon: Mcarthur Rossetti, MD;  Location: [facility];  Service: Orthopedics;  Laterality: Left;   WRIST SURGERY Bilateral    carpal tunnel   Social History   Occupational History   Occupation: braxton Therapist, art: [employer]  Tobacco Use   Smoking status: Never Smoker   Smokeless tobacco: Never Used  Substance and Sexual Activity   Alcohol use: No   Drug use: No   Sexual activity: Not on file

## 2019-11-11 ENCOUNTER — Inpatient Hospital Stay: Payer: Self-pay | Admitting: Orthopaedic Surgery

## 2019-11-18 ENCOUNTER — Ambulatory Visit (INDEPENDENT_AMBULATORY_CARE_PROVIDER_SITE_OTHER): Payer: Self-pay

## 2019-11-18 ENCOUNTER — Ambulatory Visit (INDEPENDENT_AMBULATORY_CARE_PROVIDER_SITE_OTHER): Payer: Self-pay | Admitting: Orthopaedic Surgery

## 2019-11-18 ENCOUNTER — Encounter: Payer: Self-pay | Admitting: Orthopaedic Surgery

## 2019-11-18 ENCOUNTER — Other Ambulatory Visit: Payer: Self-pay

## 2019-11-18 DIAGNOSIS — S63502A Unspecified sprain of left wrist, initial encounter: Secondary | ICD-10-CM

## 2019-11-18 DIAGNOSIS — S52124A Nondisplaced fracture of head of right radius, initial encounter for closed fracture: Secondary | ICD-10-CM

## 2019-11-18 MED ORDER — HYDROCODONE-ACETAMINOPHEN 5-325 MG PO TABS: 1.0000 | ORAL_TABLET | Freq: Four times a day (QID) | ORAL | 0 refills | Status: DC | PRN

## 2019-11-18 MED ORDER — LIDOCAINE HCL 1 % IJ SOLN
1.0000 mL | INTRAMUSCULAR | Status: AC | PRN
  Administered 2019-11-18: 1 mL

## 2019-11-18 MED ORDER — METHYLPREDNISOLONE ACETATE 40 MG/ML IJ SUSP
40.0000 mg | INTRAMUSCULAR | Status: AC | PRN
  Administered 2019-11-18: 10:00:00 40 mg

## 2019-11-18 NOTE — Progress Notes (Signed)
+    Office Visit Note   [name]           Date of Birth: May 15, 1956           MRN: [id number] Visit Date: 11/18/2019              Requested by: Leanna Battles, MD [place],  [place] [address] PCP: Leanna Battles, MD   Assessment & Plan: Visit Diagnoses:  1. Closed nondisplaced fracture of head of right radius, initial encounter   2. Sprain of left wrist, initial encounter     Plan: I did give her a steroid injection around the base of the left thumb joint at the [facility] area which she tolerated well.  She will continue with thumb spica wrist splint on left side and will have her wear a thumb spica splint on the right side.  She can be out of the sling and splint on the right elbow at this standpoint.  She will still avoid heavy lifting.  I would like to see her back in 4 weeks.  At that visit I would like 2 views of the right wrist and 2 views of the right elbow.  I will send in some more hydrocodone for her as well.  All question concerns were answered and addressed.  Follow-Up Instructions: Return in about 4 weeks (around 12/16/2019).   Orders:  Orders Placed This Encounter  Procedures  . Hand/UE Inj  . XR Elbow Complete Right (3+View)  . XR Wrist 2 Views Left   Meds ordered this encounter  Medications  . HYDROcodone-acetaminophen (NORCO/VICODIN) 5-325 MG tablet    Sig: Take 1 tablet by mouth every 6 (six) hours as needed.    Dispense:  30 tablet    Refill:  0      Procedures: Hand/UE Inj: L thumb CMC for osteoarthritis on 11/18/2019 9:35 AM Medications: 1 mL lidocaine 1 %; 40 mg methylPREDNISolone acetate 40 MG/ML      Clinical Data: No additional findings.   Subjective: Chief Complaint  Patient presents with  . Right Elbow - Follow-up  . Left Wrist - Follow-up  The patient is about 3 weeks out from a mechanical fall in which she sustained a nondisplaced fractures of the right radial head and neck area.  She does have a history  of a CMC arthroplasty of the base of the right thumb.  The wrist and elbow been hurting her.  We have had her in a splint.  On the left side she has severe CMC joint arthritis at the base of the thumb.  She is reporting pain is significant in this area.  She does wear a Velcro thumb spica splint on the left side.  She denies any numbness and tingling on either side.  She says the elbow pain is quite severe on the right side.  HPI  Review of Systems She currently denies any headache, chest pain, shortness of breath, fever, chills, nausea, vomiting  Objective: Vital Signs: There were no vitals taken for this visit.  Physical Exam She is alert and orient x3 and in no acute distress Ortho Exam Examination of her right elbow does show less swelling and improve motion but it is painful.  It radiates to the wrist.  The wrist is stable ligamentously as is the thumb.  She does have significant pain over the basilar thumb joint her left side and pain with the wrist joint on the left as well with flexion and  extension. Specialty Comments:  No specialty comments available.  Imaging: XR Elbow Complete Right (3+View)  Result Date: 11/18/2019 3 views of the right elbow show a nondisplaced healing radial head/neck fracture.  The elbow joint is well located.  XR Wrist 2 Views Left  Result Date: 11/18/2019 2 views of the left wrist show no fracture.  There is severe basilar thumb joint arthritis.    PMFS History: Patient Active Problem List   Diagnosis Date Noted  . TIA (transient ischemic attack) 05/29/2019  . Anterolisthesis 05/29/2019  . Thyroid nodule 05/29/2019  . 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 Right arm weakness 05/27/2019  . Type 2 diabetes mellitus without complication ([place]) 99991111  . Arthritis of carpometacarpal [facility]) joint of right thumb 04/16/2019  . Mixed conductive and sensorineural hearing loss of left ear with restricted hearing of right ear 04/26/2018  . Vertigo  04/26/2018  . Venous stasis dermatitis of left lower extremity 04/09/2018  . Achilles tendon contracture, left 04/09/2018  . Pain in left ankle and joints of left foot 04/09/2018  . Primary osteoarthritis of first carpometacarpal joint of right hand 10/09/2017  . Bilateral hand pain 10/05/2017  . Primary osteoarthritis of both first carpometacarpal joints 10/05/2017  . Trochanteric bursitis, right hip 06/29/2017  . Degenerative disc disease at L5-S1 level 05/04/2017  . History of joint replacement 05/04/2017  . Claw toe, acquired, right 04/27/2017  . Achilles tendon contracture, right 04/27/2017  . Pain in right foot 04/11/2017  . Status post left hip replacement 10/21/2016  . Unilateral primary osteoarthritis, left hip 09/23/2016    Class: Chronic  . Chronic back pain 01/14/2016  . Migraine 01/14/2016  . Essential hypertension 01/22/2015   Past Medical History:  Diagnosis Date  . Arthritis   . Arthritis    back, wrists  . Cancer ([place])    skin cancer  . Carpal tunnel syndrome   . Complication of anesthesia   . DDD (degenerative disc disease)   . Diabetes mellitus without complication ([place])    pt recently started on metformin  . Dysrhythmia 1995   irreg hr  . Full dentures   . GERD (gastroesophageal reflux disease)   . Headache   . High cholesterol   . History of bronchitis   . History of kidney stones    2015  . Hypertension   . PONV (postoperative nausea and vomiting)   . Sleep apnea    pt. does not use a CPAP or BiPAP  . Stroke ([place])    Pt. states possible mini stroke in 2014, no deficits    Family History  Problem Relation Age of Onset  . Cancer Mother   . Diabetes Mother   . Heart disease Mother   . Hyperlipidemia Mother   . Hypertension Mother   . Cancer Father   . Diabetes Father   . Heart disease Father   . Hyperlipidemia Father   . Hypertension Father   . Varicose Veins Father   . Cancer Sister   . Heart disease Sister   . Hyperlipidemia Sister     . Cancer Brother   . Diabetes Brother   . Hyperlipidemia Brother     Past Surgical History:  Procedure Laterality Date  . ABDOMINAL HYSTERECTOMY    . BACK SURGERY  2004   [id number]-lam  . BACK SURGERY    . CARDIAC CATHETERIZATION     01/27/15 [facility]): EF 60%, normal coronaries.  . CARPAL TUNNEL RELEASE  09/20/2012   Procedure: CARPAL TUNNEL RELEASE;  Surgeon: Cammie Sickle., MD;  Location: [facility];  Service: Orthopedics;  Laterality: Left;  . CARPOMETACARPEL SUSPENSION PLASTY Right 12/12/2018   Procedure: RIGHT THUMB CARPOMETACARPAL [facility]) ARTHROPLASTY;  Surgeon: Leandrew Koyanagi, MD;  Location: [place];  Service: Orthopedics;  Laterality: Right;  . CATARACT EXTRACTION, BILATERAL    . CHOLECYSTECTOMY  07/2016   done [place]  . DORSAL COMPARTMENT RELEASE  09/20/2012   Procedure: RELEASE DORSAL COMPARTMENT (DEQUERVAIN);  Surgeon: Cammie Sickle., MD;  [facility];  Service: Orthopedics;  Laterality: Left;  . EYE SURGERY     bilat cataracts  . EYE SURGERY    . HERNIA REPAIR Left 1990   left groin   . LOWER EXTREMITY ANGIOGRAM Left 06/08/2015   Procedure: Ascending Venogram  Iliac Vein ;  Surgeon: Elam Dutch, MD;  Location: [facility] OR;  Service: Vascular;  Laterality: Left;  ;  TARA- [facility] [facility] / ZACH-[place] HAVE BEEN NOTIFIED/ CONFIRMED  . PARTIAL HYSTERECTOMY    . SHOULDER SURGERY    . TOTAL HIP ARTHROPLASTY Left 10/21/2016   Procedure: LEFT TOTAL HIP ARTHROPLASTY ANTERIOR APPROACH;  Surgeon: Mcarthur Rossetti, MD;  [facility];  Service: Orthopedics;  Laterality: Left;  . WRIST SURGERY Bilateral    carpal tunnel   Social History   Occupational History  . Occupation: braxton Therapist, art: [employer]  Tobacco Use  . Smoking status: Never Smoker  . Smokeless tobacco: Never Used  Substance and Sexual Activity  . Alcohol use: No  . Drug use: No  . Sexual activity: Not on file

## 2019-12-10 ENCOUNTER — Telehealth: Payer: Self-pay | Admitting: *Deleted

## 2019-12-10 NOTE — Telephone Encounter (Signed)
OV

## 2019-12-11 NOTE — Telephone Encounter (Signed)
Called pt and she is scheduled for a ROV 12/24/2019

## 2019-12-16 ENCOUNTER — Ambulatory Visit: Payer: 59 | Admitting: Orthopaedic Surgery

## 2019-12-18 ENCOUNTER — Ambulatory Visit (INDEPENDENT_AMBULATORY_CARE_PROVIDER_SITE_OTHER): Payer: Self-pay

## 2019-12-18 ENCOUNTER — Other Ambulatory Visit: Payer: Self-pay

## 2019-12-18 ENCOUNTER — Encounter: Payer: Self-pay | Admitting: Orthopaedic Surgery

## 2019-12-18 ENCOUNTER — Ambulatory Visit (INDEPENDENT_AMBULATORY_CARE_PROVIDER_SITE_OTHER): Payer: Self-pay | Admitting: Orthopaedic Surgery

## 2019-12-18 DIAGNOSIS — S63502A Unspecified sprain of left wrist, initial encounter: Secondary | ICD-10-CM

## 2019-12-18 DIAGNOSIS — S52124D Nondisplaced fracture of head of right radius, subsequent encounter for closed fracture with routine healing: Secondary | ICD-10-CM

## 2019-12-18 DIAGNOSIS — M25551 Pain in right hip: Secondary | ICD-10-CM

## 2019-12-18 MED ORDER — LIDOCAINE HCL 1 % IJ SOLN
3.0000 mL | INTRAMUSCULAR | Status: AC | PRN
  Administered 2019-12-18: 10:00:00 3 mL

## 2019-12-18 MED ORDER — METHYLPREDNISOLONE ACETATE 40 MG/ML IJ SUSP
40.0000 mg | INTRAMUSCULAR | Status: AC | PRN
  Administered 2019-12-18: 10:00:00 40 mg via INTRA_ARTICULAR

## 2019-12-18 NOTE — Progress Notes (Signed)
Office Visit Note   [name]           Date of Birth: 11/20/1956           MRN: [id number] Visit Date: 12/18/2019              Requested by: Leanna Battles, MD [place],  [place] [id number] PCP: Leanna Battles, MD   Assessment & Plan: Visit Diagnoses:  1. Closed nondisplaced fracture of head of right radius with routine healing, subsequent encounter   2. Sprain of left wrist, initial encounter   3. Pain in right hip     Plan: We will refer her back to Dr. Erlinda Hong for possible left [facility] joint suspension arthroplasty.  She had the right thumb done by Dr. Sherrian Divers in the past and is done well with this.  She is also having some right wrist pain since the injury 7 weeks ago with fall she would like to discuss this with Dr. Erlinda Hong as he did a right [facility] joint suspension arthroplasty.  She is having global pain about the wrist today.  Does not want any x-rays performed today.  In regards to her ankle like for her to do an ASO brace which she is given today wear this for 2 weeks when in the home and out of the home.  Then after 2 weeks discontinue the brace and just wear regular shoe in the home.  Continue to wear the brace when out of the home for the next 2 weeks and then discontinue.  She is given exercises to do at home for range of motion of the ankle.  Advised her that it may take several months for the ankle pain to completely dissipate along with the swelling.  It is felt that her right hip pain may be exacerbated by the cam walker boot and this is another reason not like to get her out of the cam walker boot soon as possible. Right radial head fracture appears well-healed on the radiographs and therefore recommend she slowly advance activities as tolerated.  Follow-up with [place] on as-needed basis in regards to the right elbow.   Follow-Up Instructions: Return Dr. Erlinda Hong for left thumb.   Orders:  Orders Placed This Encounter  Procedures  . Large Joint Inj: R greater trochanter   . XR Elbow 2 Views Right  . XR Wrist 2 Views Left  . XR HIP UNILAT W OR W/O PELVIS 1V RIGHT   No orders of the defined types were placed in this encounter.     Procedures: Large Joint Inj: R greater trochanter on 12/18/2019 10:14 AM Indications: pain Details: 22 G 1.5 in needle, lateral approach  Arthrogram: No  Medications: 3 mL lidocaine 1 %; 40 mg methylPREDNISolone acetate 40 MG/ML Outcome: tolerated well, no immediate complications Procedure, treatment alternatives, risks and benefits explained, specific risks discussed. Consent was given by the patient. Immediately prior to procedure a time out was called to verify the correct patient, procedure, equipment, support staff and site/side marked as required. Patient was prepped and draped in the usual sterile fashion.       Clinical Data: No additional findings.   Subjective: Chief Complaint  Patient presents with  . Right Elbow - Follow-up    HPI Natalie Bond is now less than 7 weeks status post mechanical fall which she sustained a nondisplaced fracture of the right radial head neck.  She comes in today with multiple complaints 1 being the right elbow.  The other being bilateral wrist pain.  She has a history of CMC arthroplasty by Dr. Sherrian Divers in the past and is done well until the fall.  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  Also having global pain about the left wrist.  She has been wearing Velcro thumb spica splint on and off due to pain in the left wrist.  Also she is having right hip pain is unsure if this is due to the fall or due to wearing the cam walker boot.  She is working on the boot on the left ankle due to a previous ankle injury.  She had MRI back in October of the ankle which showed significant edema of the lateral malleolus suggesting stress fracture.  Also some interstitial tearing within the peroneus longus and brevis tendons.  Unfortunately she is unable to do physical therapy due to  having no insurance.  She states she continues to have pain in the lateral aspect of the ankle.  No new injury to the left ankle Review of Systems Please see HPI otherwise negative  Objective: Vital Signs: There were no vitals taken for this visit.  Physical Exam Constitutional:      Appearance: She is not ill-appearing or diaphoretic.  Pulmonary:     Effort: Pulmonary effort is normal.  Neurological:     Mental Status: She is alert and oriented to person, place, and time.  Psychiatric:        Mood and Affect: Mood normal.     Ortho Exam Left wrist bony tenderness.  Positive grind test left thumb.  No obvious deformity left wrist.  Right wrist "tenderness no gross deformity.  Right elbow full extension full flexion.  Full supination pronation forearm.  No gross deformity of the elbow.  Tenderness over the radial head.  Remainder the elbow nontender.  Left ankle full dorsiflexion plantarflexion.  Full knee eversion left foot without pain inversion causes pain laterally.  Out of 5 strength with inversion eversion against resistance.  Tenderness over the distal left fibula and over the peroneal tendons.  Slight edema about the left ankle.  Calf supple nontender.  Right hip good range of motion pain with external rotation.  Tenderness over the right greater trochanteric region. Specialty Comments:  No specialty comments available.  Imaging: XR HIP UNILAT W OR W/O PELVIS 1V RIGHT  Result Date: 12/18/2019 AP pelvis and lateral view of the right hip: Both hips well located.  Status post left total hip arthroplasty.  Right hip is well-maintained.  Calcific changes over the right greater trochanteric region consistent with chronic bursitis.  No acute fractures.  XR Elbow 2 Views Right  Result Date: 12/18/2019 Right elbow 2 views: Radial head/neck fracture remains nondisplaced.  Further consolidation at the fracture site is seen.  Elbow joint is congruent.  No other fractures  identified.  XR Wrist 2 Views Left  Result Date: 12/18/2019 Left wrist 2 views: No acute fractures.  Severe CMC joint arthritis base of the thumb.    PMFS History: Patient Active Problem List   Diagnosis Date Noted  . TIA (transient ischemic attack) 05/29/2019  . Anterolisthesis 05/29/2019  . Thyroid nodule 05/29/2019  . 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 Right arm weakness 05/27/2019  . Type 2 diabetes mellitus without complication ([place]) 99991111  . Arthritis of carpometacarpal [facility]) joint of right thumb 04/16/2019  . Mixed conductive and sensorineural hearing loss of left ear with restricted hearing of right ear 04/26/2018  .  Vertigo 04/26/2018  . Venous stasis dermatitis of left lower extremity 04/09/2018  . Achilles tendon contracture, left 04/09/2018  . Pain in left ankle and joints of left foot 04/09/2018  . Primary osteoarthritis of first carpometacarpal joint of right hand 10/09/2017  . Bilateral hand pain 10/05/2017  . Primary osteoarthritis of both first carpometacarpal joints 10/05/2017  . Trochanteric bursitis, right hip 06/29/2017  . Degenerative disc disease at L5-S1 level 05/04/2017  . History of joint replacement 05/04/2017  . Claw toe, acquired, right 04/27/2017  . Achilles tendon contracture, right 04/27/2017  . Pain in right foot 04/11/2017  . Status post left hip replacement 10/21/2016  . Unilateral primary osteoarthritis, left hip 09/23/2016    Class: Chronic  . Chronic back pain 01/14/2016  . Migraine 01/14/2016  . Essential hypertension 01/22/2015   Past Medical History:  Diagnosis Date  . Arthritis   . Arthritis    back, wrists  . Cancer ([place])    skin cancer  . Carpal tunnel syndrome   . Complication of anesthesia   . DDD (degenerative disc disease)   . Diabetes mellitus without complication ([place])    pt recently started on metformin  . Dysrhythmia 1995   irreg hr  . Full dentures   . GERD (gastroesophageal reflux disease)   .  Headache   . High cholesterol   . History of bronchitis   . History of kidney stones    2015  . Hypertension   . PONV (postoperative nausea and vomiting)   . Sleep apnea    pt. does not use a CPAP or BiPAP  . Stroke ([place])    Pt. states possible mini stroke in 2014, no deficits    Family History  Problem Relation Age of Onset  . Cancer Mother   . Diabetes Mother   . Heart disease Mother   . Hyperlipidemia Mother   . Hypertension Mother   . Cancer Father   . Diabetes Father   . Heart disease Father   . Hyperlipidemia Father   . Hypertension Father   . Varicose Veins Father   . Cancer Sister   . Heart disease Sister   . Hyperlipidemia Sister   . Cancer Brother   . Diabetes Brother   . Hyperlipidemia Brother     Past Surgical History:  Procedure Laterality Date  . ABDOMINAL HYSTERECTOMY    . BACK SURGERY  2004   x2lumb-lam  . BACK SURGERY    . CARDIAC CATHETERIZATION     01/27/15 [facility]): EF 60%, normal coronaries.  . CARPAL TUNNEL RELEASE  09/20/2012   Procedure: CARPAL TUNNEL RELEASE;  Surgeon: Cammie Sickle., MD;  Location: [place];  Service: Orthopedics;  Laterality: Left;  . CARPOMETACARPEL SUSPENSION PLASTY Right 12/12/2018   Procedure: RIGHT THUMB CARPOMETACARPAL [facility]) ARTHROPLASTY;  Surgeon: Leandrew Koyanagi, MD;  Location: [place];  Service: Orthopedics;  Laterality: Right;  . CATARACT EXTRACTION, BILATERAL    . CHOLECYSTECTOMY  07/2016   [place]  . DORSAL COMPARTMENT RELEASE  09/20/2012   Procedure: RELEASE DORSAL COMPARTMENT (DEQUERVAIN);  Surgeon: Cammie Sickle., MD;  Location: [facility];  Service: Orthopedics;  Laterality: Left;  . EYE SURGERY     bilat cataracts  . EYE SURGERY    . HERNIA REPAIR Left 1990   left groin   . LOWER EXTREMITY ANGIOGRAM Left 06/08/2015   Procedure: Ascending Venogram  Iliac Vein ;  Surgeon: Elam Dutch, MD;  [facility]: [facility];  Service: Vascular;   Laterality: Left;  ;  TARA- [facility] SCIENTIFIC / ZACH-[place] HAVE BEEN NOTIFIED/ CONFIRMED  . PARTIAL HYSTERECTOMY    . SHOULDER SURGERY    . TOTAL HIP ARTHROPLASTY Left 10/21/2016   Procedure: LEFT TOTAL HIP ARTHROPLASTY ANTERIOR APPROACH;  Surgeon: Mcarthur Rossetti, MD;  Location: [facility];  Service: Orthopedics;  Laterality: Left;  . WRIST SURGERY Bilateral    carpal tunnel   Social History   Occupational History  . Occupation: braxton Therapist, art: [employer]  Tobacco Use  . Smoking status: Never Smoker  . Smokeless tobacco: Never Used  Substance and Sexual Activity  . Alcohol use: No  . Drug use: No  . Sexual activity: Not on file

## 2019-12-24 ENCOUNTER — Ambulatory Visit (INDEPENDENT_AMBULATORY_CARE_PROVIDER_SITE_OTHER): Payer: Self-pay | Admitting: Orthopaedic Surgery

## 2019-12-24 ENCOUNTER — Encounter: Payer: Self-pay | Admitting: Physical Medicine and Rehabilitation

## 2019-12-24 ENCOUNTER — Ambulatory Visit: Payer: Self-pay

## 2019-12-24 ENCOUNTER — Ambulatory Visit (INDEPENDENT_AMBULATORY_CARE_PROVIDER_SITE_OTHER): Payer: Self-pay

## 2019-12-24 ENCOUNTER — Ambulatory Visit (INDEPENDENT_AMBULATORY_CARE_PROVIDER_SITE_OTHER): Payer: Self-pay | Admitting: Physical Medicine and Rehabilitation

## 2019-12-24 ENCOUNTER — Other Ambulatory Visit: Payer: Self-pay

## 2019-12-24 ENCOUNTER — Encounter (HOSPITAL_BASED_OUTPATIENT_CLINIC_OR_DEPARTMENT_OTHER): Payer: Self-pay | Admitting: Orthopaedic Surgery

## 2019-12-24 VITALS — BP 172/107 | HR 109

## 2019-12-24 DIAGNOSIS — M1812 Unilateral primary osteoarthritis of first carpometacarpal joint, left hand: Secondary | ICD-10-CM | POA: Insufficient documentation

## 2019-12-24 DIAGNOSIS — M5116 Intervertebral disc disorders with radiculopathy, lumbar region: Secondary | ICD-10-CM

## 2019-12-24 DIAGNOSIS — M654 Radial styloid tenosynovitis [de Quervain]: Secondary | ICD-10-CM

## 2019-12-24 DIAGNOSIS — M961 Postlaminectomy syndrome, not elsewhere classified: Secondary | ICD-10-CM

## 2019-12-24 DIAGNOSIS — M5416 Radiculopathy, lumbar region: Secondary | ICD-10-CM

## 2019-12-24 DIAGNOSIS — M7072 Other bursitis of hip, left hip: Secondary | ICD-10-CM

## 2019-12-24 MED ORDER — PREDNISONE 50 MG PO TABS: ORAL_TABLET | ORAL | 0 refills | Status: DC

## 2019-12-24 NOTE — Progress Notes (Signed)
  Numeric Pain Rating Scale and Functional Assessment Average Pain 8   In the last MONTH (on 0-10 scale) has pain interfered with the following?  1. General activity like being  able to carry out your everyday physical activities such as walking, climbing stairs, carrying groceries, or moving a chair?  Rating(10)    

## 2019-12-24 NOTE — Progress Notes (Signed)
Office Visit Note   [name]           Date of Birth: [birth year]           MRN: [id number] Visit Date: 12/24/2019              Requested by: Leanna Battles, MD [place],  [place] [id number] PCP: Leanna Battles, MD   Assessment & Plan: Visit Diagnoses:  1. Primary osteoarthritis of first carpometacarpal joint of left hand   2. De Quervain's tenosynovitis, right     Plan: My impression is right wrist de Quervain's tenosynovitis.  She is supposed to start on a prednisone Dosepak today for her back so she will hold off on an injection.  She has a thumb spica brace to wear.  For the left thumb she would like to go ahead and schedule for a [facility] arthroplasty.  Risk benefits and rehab and recovery were again discussed with the patient and she is agreeable to proceed with scheduling.  Follow-Up Instructions: Return if symptoms worsen or fail to improve.   Orders:  Orders Placed This Encounter  Procedures  . XR Finger Thumb Right  . XR Finger Thumb Left   No orders of the defined types were placed in this encounter.     Procedures: No procedures performed   Clinical Data: No additional findings.   Subjective: Chief Complaint  Patient presents with  . Left Thumb - Pain    Natalie Bond is a 64 year old female who is well-known to me who comes in for evaluation of bilateral wrist pain.  She recently had a mechanical fall directly onto her right forearm and she has had right wrist pain since then.  She is status post [facility] arthroplasty about a year ago.  It is sore to turn her wrist in pronation.  Denies any numbness and tingling.  In terms of her left thumb she has had chronic pain with significant dysfunction of her left hand for quite some time.  She is interested in having [facility] arthroplasty for this as she had a very good result with the other side issues happy with outcome.   Review of Systems  Constitutional: Negative.   HENT: Negative.   Eyes:  Negative.   Respiratory: Negative.   Cardiovascular: Negative.   Endocrine: Negative.   Musculoskeletal: Negative.   Neurological: Negative.   Hematological: Negative.   Psychiatric/Behavioral: Negative.   All other systems reviewed and are negative.    Objective: Vital Signs: There were no vitals taken for this visit.  Physical Exam Vitals and nursing note reviewed.  Constitutional:      Appearance: She is well-developed.  Pulmonary:     Effort: Pulmonary effort is normal.  Skin:    General: Skin is warm.     Capillary Refill: Capillary refill takes less than 2 seconds.  Neurological:     Mental Status: She is alert and oriented to person, place, and time.  Psychiatric:        Behavior: Behavior normal.        Thought Content: Thought content normal.        Judgment: Judgment normal.     Ortho Exam Right wrist exam shows pain with palpation at the radial styloid with positive Finkelstein's test.  She has good grip strength and thumb mobility.  Left wrist exam shows positive grind test and pain with pinch and gripping. Specialty Comments:  No specialty comments available.  Imaging: XR Finger Thumb Left  Result  Date: 12/24/2019 Advanced Beulah Gandy stage IV arthrosis of the left thumb CMC joint.  No acute abnormalities.  XR Finger Thumb Right  Result Date: 12/24/2019 No acute abnormalities to the right hand.  She is status post CMC suspension plasty    PMFS History: Patient Active Problem List   Diagnosis Date Noted  . Primary osteoarthritis of first carpometacarpal joint of left hand 12/24/2019  . TIA (transient ischemic attack) 05/29/2019  . Anterolisthesis 05/29/2019  . Thyroid nodule 05/29/2019  . 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 Right arm weakness 05/27/2019  . Type 2 diabetes mellitus without complication ([place]) 99991111  . Arthritis of carpometacarpal [facility]) joint of right thumb 04/16/2019  . Mixed conductive and sensorineural hearing loss of left  ear with restricted hearing of right ear 04/26/2018  . Vertigo 04/26/2018  . Venous stasis dermatitis of left lower extremity 04/09/2018  . Achilles tendon contracture, left 04/09/2018  . Pain in left ankle and joints of left foot 04/09/2018  . Primary osteoarthritis of first carpometacarpal joint of right hand 10/09/2017  . Bilateral hand pain 10/05/2017  . Primary osteoarthritis of both first carpometacarpal joints 10/05/2017  . Trochanteric bursitis, right hip 06/29/2017  . Degenerative disc disease at L5-S1 level 05/04/2017  . History of joint replacement 05/04/2017  . Claw toe, acquired, right 04/27/2017  . Achilles tendon contracture, right 04/27/2017  . Pain in right foot 04/11/2017  . Status post left hip replacement 10/21/2016  . Unilateral primary osteoarthritis, left hip 09/23/2016    Class: Chronic  . Chronic back pain 01/14/2016  . Migraine 01/14/2016  . Essential hypertension 01/22/2015   Past Medical History:  Diagnosis Date  . Arthritis   . Arthritis    back, wrists  . Cancer ([place])    skin cancer  . Carpal tunnel syndrome   . Complication of anesthesia   . DDD (degenerative disc disease)   . Diabetes mellitus without complication ([place])    pt recently started on metformin  . Dysrhythmia 1995   irreg hr  . Full dentures   . GERD (gastroesophageal reflux disease)   . Headache   . High cholesterol   . History of bronchitis   . History of kidney stones    2015  . Hypertension   . PONV (postoperative nausea and vomiting)   . Sleep apnea    pt. does not use a CPAP or BiPAP  . Stroke ([place])    Pt. states possible mini stroke in 2014, no deficits    Family History  Problem Relation Age of Onset  . Cancer Mother   . Diabetes Mother   . Heart disease Mother   . Hyperlipidemia Mother   . Hypertension Mother   . Cancer Father   . Diabetes Father   . Heart disease Father   . Hyperlipidemia Father   . Hypertension Father   . Varicose Veins Father   .  Cancer Sister   . Heart disease Sister   . Hyperlipidemia Sister   . Cancer Brother   . Diabetes Brother   . Hyperlipidemia Brother     Past Surgical History:  Procedure Laterality Date  . ABDOMINAL HYSTERECTOMY    . BACK SURGERY  2004   x2lumb-lam  . BACK SURGERY    . CARDIAC CATHETERIZATION     01/27/15 [facility]): EF 60%, normal coronaries.  . CARPAL TUNNEL RELEASE  09/20/2012   Procedure: CARPAL TUNNEL RELEASE;  Surgeon: Cammie Sickle., MD;  Location: [place];  Service: Orthopedics;  Laterality: Left;  . CARPOMETACARPEL SUSPENSION PLASTY Right 12/12/2018   Procedure: RIGHT THUMB CARPOMETACARPAL [facility]) ARTHROPLASTY;  Surgeon: Leandrew Koyanagi, MD;  Location: [place];  Service: Orthopedics;  Laterality: Right;  . CATARACT EXTRACTION, BILATERAL    . CHOLECYSTECTOMY  07/2016   [place]  . DORSAL COMPARTMENT RELEASE  09/20/2012   Procedure: RELEASE DORSAL COMPARTMENT (DEQUERVAIN);  Surgeon: Cammie Sickle., MD;  [facility];  Service: Orthopedics;  Laterality: Left;  . EYE SURGERY     bilat cataracts  . EYE SURGERY    . HERNIA REPAIR Left 1990   left groin   . LOWER EXTREMITY ANGIOGRAM Left 06/08/2015   Procedure: Ascending Venogram  Iliac Vein ;  Surgeon: Elam Dutch, MD;  Location: [facility];  Service: Vascular;  Laterality: Left;  ;  TARA- [facility] SCIENTIFIC / ZACH-[place] HAVE BEEN NOTIFIED/ CONFIRMED  . PARTIAL HYSTERECTOMY    . SHOULDER SURGERY    . TOTAL HIP ARTHROPLASTY Left 10/21/2016   Procedure: LEFT TOTAL HIP ARTHROPLASTY ANTERIOR APPROACH;  Surgeon: Mcarthur Rossetti, MD;  [facility];  Service: Orthopedics;  Laterality: Left;  . WRIST SURGERY Bilateral    carpal tunnel   Social History   Occupational History  . 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 Smoking status: Never Smoker  . Smokeless tobacco: Never Used  Substance and Sexual Activity  .  Alcohol use: No  . Drug use: No  . Sexual activity: Not on file

## 2019-12-24 NOTE — Progress Notes (Signed)
Natalie Bond - 64 y.o. female MRN [id number]  Date of birth: 22-Jun-1956  Office Visit Note: Visit Date: 12/24/2019 PCP: Leanna Battles, MD Referred by: Leanna Battles, MD  Subjective: Chief Complaint  Patient presents with  . Lower Back - Pain  . Left Buttock    Pain   HPI: Natalie Bond is a 64 y.o. female who comes in today For evaluation management of the left low back left buttock and hamstring pain which is chronic worsening and severe.  She is well-known to me and has had many years of chronic musculoskeletal complaints and injuries.  She is actually seeing Dr. Eduard Roux today for evaluation and management continued with her hands.  We have seen her in the past with known disc herniation at L5-S1 status post 2 lumbar laminectomy discectomies.  She has had residual small disc protrusion at that level on the left side.  Has done well in the past with S1 transforaminal injections.  We had an episode in the recent past where it was more of an upper lumbar pain she had facet arthropathy with facet joint cyst but that has resolved.  She comes in today with 8 out of 10 pain and really loss of function.  She has been unable to work and has been let go by her company.  She has no insurance at this point.  She continues to follow with her primary care physician Dr. Philip Aspen.  She reports no new injuries just this chronic worsening left buttock pain that refers sometimes to the inside of the thigh from the back not the front.  She has had prior total hip arthroplasty on the left through Dr. Ninfa Linden in our office.  She denies any groin pain.  She denies any real pain past the knee.  No paresthesias.  Pain is worse with weightbearing and sitting.  Review of Systems  Constitutional: Negative for chills, fever, malaise/fatigue and weight loss.  HENT: Negative for hearing loss and sinus pain.   Eyes: Negative for blurred vision, double vision and photophobia.  Respiratory: Negative for cough and  shortness of breath.   Cardiovascular: Negative for chest pain, palpitations and leg swelling.  Gastrointestinal: Negative for abdominal pain, nausea and vomiting.  Genitourinary: Negative for flank pain.  Musculoskeletal: Positive for back pain and joint pain. Negative for myalgias.  Skin: Negative for itching and rash.  Neurological: Negative for tremors, focal weakness and weakness.  Endo/Heme/Allergies: Negative.   Psychiatric/Behavioral: Negative for depression.  All other systems reviewed and are negative.  Otherwise per HPI.  Assessment & Plan: Visit Diagnoses:  1. Ischial bursitis of left side   2. Lumbar radiculopathy   3. Radiculopathy due to lumbar intervertebral disc disorder   4. Post laminectomy syndrome     Plan: Findings:  Clinical exam and clinical interview consistent more with a ischial bursitis but I cannot rule out on the differential and S1 radiculopathy do to prior lumbar surgery and postlaminectomy syndrome as well as small disc protrusion.  MRIs from a couple of years ago and could entertain the idea of a new MRI if this became persistent.  She is looking at getting [place] in the next few weeks and trying to help out with getting insurance.  At this point I think the best approach is a few days course of prednisone as an anti-inflammatory along with her current medication.  She does get a small amount of hydrocodone from Dr. Janie Morning.  She reports taking 5 mg  of hydrocodone up to 3 sometimes 4 times a day.  I have warned her about the problems with chronic opioid use and its terms with decreasing ability to help with pain if is taken regularly.  We also will schedule her for diagnostic ischial bursa injection with fluoroscopic guidance.  If that is not very beneficial would entertain the idea of one-time epidural injection and then look at imaging of the spine if needed.  She will continue with home exercises.  She has had multiple bouts of physical therapy  in the past.    Meds & Orders:  Meds ordered this encounter  Medications  . predniSONE (DELTASONE) 50 MG tablet    Sig: Take 1 tablet daily with food for 5 days until finished    Dispense:  5 tablet    Refill:  0   No orders of the defined types were placed in this encounter.   Follow-up: Return for Left ischial bursa injection.   Procedures: No procedures performed  No notes on file   Clinical History: HISTORY: Bilateral hip pain  TECHNIQUE: 2 view bilateral hip study  FINDINGS: Right hip shows no acute osseous abnormalities. Dystrophic calcifications are seen in the soft tissues adjacent to the greater trochanter most likely age related but could represent old injury. No articular irregularities. Unremarkable SI  joints.  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 No evidence of loosening or periarticular fractures. Alignment unremarkable. Unremarkable left SI joint Other Result Information Acute Interface, Incoming Rad Results - 11/11/2019  4:06 PM EST HISTORY: Bilateral hip pain  TECHNIQUE: 2 view bilateral hip study  FINDINGS: Right hip shows no acute osseous abnormalities. Dystrophic calcifications are seen in the soft tissues adjacent to the greater trochanter most likely age related but could represent old injury. No articular irregularities. Unremarkable SI  joints.  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 No evidence of loosening or periarticular fractures. Alignment unremarkable. Unremarkable left SI joint   IMPRESSION: No acute osseous or soft tissue findings.  Electronically Signed by: Satira Sark   ----- MRI LUMBAR SPINE WITHOUT CONTRAST    TECHNIQUE:  Multiplanar, multisequence MR imaging of the lumbar spine was  performed. No intravenous contrast was administered.    COMPARISON: 12/18/2015 lumbar spine MRI.    FINDINGS:  Segmentation: Standard.    Alignment: Mild lumbar levocurvature with apex at L3. Normal lumbar  lordosis  without listhesis.    Vertebrae: No fracture, evidence of discitis, or bone lesion.  Right-sided L4-5 facet edema.    Conus medullaris and cauda equina: Conus extends to the L1-2 level.  Conus and cauda equina appear normal.    Paraspinal and other soft tissues: Chronic postsurgical changes  related to left L5-S1 hemilaminectomy. No fluid collection.    Disc levels:    T12-L1: No significant disc displacement, foraminal stenosis, or  canal stenosis.    L1-2: No significant disc displacement, foraminal stenosis, or canal  stenosis.    L2-3: Stable small left foraminal disc protrusion. No significant  foraminal or canal stenosis.    L3-4: Stable disc bulge and facet hypertrophy. Interval resorption  of right juxta-articular cyst. Mild bilateral recess stenosis. No  foraminal or canal stenosis.    L4-5: Stable disc bulge, facet hypertrophy, and ligamentum flavum  hypertrophy, greater on the left. Mild left lateral recess stenosis.  No foraminal or canal stenosis.    L5-S1: Left hemilaminectomy. Stable left central disc protrusion  with mild left lateral recess stenosis. No significant foraminal or  canal  stenosis.    IMPRESSION:  1. No acute fracture. Mild levocurvature of the lumbar spine.  2. Right L4-5 facet edema, likely degenerative.  3. Stable lumbar spondylosis. No significant foraminal or canal  stenosis.      Electronically Signed  By: Kristine Garbe M.D.  On: 12/01/2018 14:02   She reports that she has never smoked. She has never used smokeless tobacco.  Recent Labs    05/28/19 0113  HGBA1C 6.1*    Objective:  VS:  HT:    WT:   BMI:     BP:(!) 172/107  HR:(!) 109bpm  TEMP: ( )  RESP:  Physical Exam Vitals and nursing note reviewed.  Constitutional:      General: She is not in acute distress.    Appearance: Normal appearance. She is well-developed. She is not ill-appearing.  HENT:     Head: Normocephalic and  atraumatic.  Eyes:     Conjunctiva/sclera: Conjunctivae normal.     Pupils: Pupils are equal, round, and reactive to light.  Cardiovascular:     Rate and Rhythm: Normal rate.     Pulses: Normal pulses.  Pulmonary:     Effort: Pulmonary effort is normal.  Musculoskeletal:     Right lower leg: No edema.     Left lower leg: No edema.     Comments: Pain over the ischial bursa more so than the greater trochanter.  She has some pain over the right greater trochanter.  She has no pain with hip rotation.  She has good distal strength  Skin:    General: Skin is warm and dry.     Findings: No erythema or rash.  Neurological:     General: No focal deficit present.     Mental Status: She is alert and oriented to person, place, and time.     Sensory: No sensory deficit.     Motor: No abnormal muscle tone.     Coordination: Coordination normal.     Gait: Gait normal.  Psychiatric:        Mood and Affect: Mood normal.        Behavior: Behavior normal.     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  No acute abnormalities.  XR Finger Thumb Right  Result Date: 12/24/2019 No acute abnormalities to the right hand.  She is status post CMC suspension plasty   Past Medical/Family/Surgical/Social History: Medications & Allergies reviewed per EMR, new medications updated. Patient Active Problem List   Diagnosis Date Noted  . Primary osteoarthritis of first carpometacarpal joint of left hand 12/24/2019  . TIA (transient ischemic attack) 05/29/2019  . Anterolisthesis 05/29/2019  . Thyroid nodule 05/29/2019  . 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 Right arm weakness 05/27/2019  . Type 2 diabetes mellitus without complication ([place]) 99991111  . Arthritis of carpometacarpal [facility]) joint of right thumb 04/16/2019  . Mixed conductive and sensorineural hearing loss of left ear with restricted hearing of right ear  04/26/2018  . Vertigo 04/26/2018  . Venous stasis dermatitis of left lower extremity 04/09/2018  . Achilles tendon contracture, left 04/09/2018  . Pain in left ankle and joints of left foot 04/09/2018  . Primary osteoarthritis of first carpometacarpal joint of right hand 10/09/2017  . Bilateral hand pain 10/05/2017  . Primary osteoarthritis of both first carpometacarpal joints 10/05/2017  . Trochanteric bursitis, right hip 06/29/2017  . Degenerative disc disease at L5-S1 level  05/04/2017  . History of joint replacement 05/04/2017  . Claw toe, acquired, right 04/27/2017  . Achilles tendon contracture, right 04/27/2017  . Pain in right foot 04/11/2017  . Status post left hip replacement 10/21/2016  . Unilateral primary osteoarthritis, left hip 09/23/2016    Class: Chronic  . Chronic back pain 01/14/2016  . Migraine 01/14/2016  . Essential hypertension 01/22/2015   Past Medical History:  Diagnosis Date  . Arthritis   . Arthritis    back, wrists  . Cancer ([place])    skin cancer  . Carpal tunnel syndrome   . Complication of anesthesia   . DDD (degenerative disc disease)   . Diabetes mellitus without complication ([place])    does not take meds  . Dysrhythmia 1995   irreg hr  . Full dentures   . GERD (gastroesophageal reflux disease)   . Headache   . High cholesterol   . History of bronchitis   . History of kidney stones    2015  . Hypertension   . PONV (postoperative nausea and vomiting)   . Sleep apnea    pt. does not use a CPAP or BiPAP  . Stroke ([place])    Pt. states possible mini stroke in 2014, no deficits   Family History  Problem Relation Age of Onset  . Cancer Mother   . Diabetes Mother   . Heart disease Mother   . Hyperlipidemia Mother   . Hypertension Mother   . Cancer Father   . Diabetes Father   . Heart disease Father   . Hyperlipidemia Father   . Hypertension Father   . Varicose Veins Father   . Cancer Sister   . Heart disease Sister   . Hyperlipidemia  Sister   . Cancer Brother   . Diabetes Brother   . Hyperlipidemia Brother    Past Surgical History:  Procedure Laterality Date  . ABDOMINAL HYSTERECTOMY    . BACK SURGERY  2004   [id number]  . BACK SURGERY    . CARDIAC CATHETERIZATION     [facility]): EF 60%, normal coronaries.  . CARPAL TUNNEL RELEASE  09/20/2012   Procedure: CARPAL TUNNEL RELEASE;  Surgeon: Cammie Sickle., MD;  Location: [place];  Service: Orthopedics;  Laterality: Left;  . CARPOMETACARPEL SUSPENSION PLASTY Right 12/12/2018   Procedure: RIGHT THUMB CARPOMETACARPAL [facility]) ARTHROPLASTY;  Surgeon: Leandrew Koyanagi, MD;  Location: [facility];  Service: Orthopedics;  Laterality: Right;  . CATARACT EXTRACTION, BILATERAL    . CHOLECYSTECTOMY  07/2016   done [place]  . DORSAL COMPARTMENT RELEASE  09/20/2012   Procedure: RELEASE DORSAL COMPARTMENT ([place]);  Surgeon: Cammie Sickle., MD;  Location: [facility];  Service: Orthopedics;  Laterality: Left;  . EYE SURGERY     bilat cataracts  . EYE SURGERY    . HERNIA REPAIR Left 1990   left groin   . LOWER EXTREMITY ANGIOGRAM Left 06/08/2015   Procedure: Ascending Venogram  Iliac Vein ;  Surgeon: Elam Dutch, MD;  Location: [facility] OR;  Service: Vascular;  Laterality: Left;  ;  [facility] [employer] / [place] HAVE BEEN NOTIFIED/ CONFIRMED  . PARTIAL HYSTERECTOMY    . SHOULDER SURGERY    . TOTAL HIP ARTHROPLASTY Left 10/21/2016   Procedure: LEFT TOTAL HIP ARTHROPLASTY ANTERIOR APPROACH;  Surgeon: Mcarthur Rossetti, MD;  Location: [facility];  Service: Orthopedics;  Laterality: Left;  . WRIST SURGERY Bilateral    carpal tunnel  Social History   Occupational History  . Occupation: [employer]ist, art: [place]  Tobacco Use  . Smoking status: Never Smoker  . Smokeless tobacco: Never Used  Substance and Sexual Activity  . Alcohol use: No  . Drug use: No  . Sexual activity: Not on file

## 2019-12-26 ENCOUNTER — Other Ambulatory Visit (HOSPITAL_COMMUNITY)
Admission: RE | Admit: 2019-12-26 | Discharge: 2019-12-26 | Disposition: A | Discharge status: Home / Self Care | Payer: Medicaid Other | Source: Ambulatory Visit | Attending: Orthopaedic Surgery | Admitting: Orthopaedic Surgery

## 2019-12-26 ENCOUNTER — Encounter (HOSPITAL_BASED_OUTPATIENT_CLINIC_OR_DEPARTMENT_OTHER)
Admission: RE | Admit: 2019-12-26 | Discharge: 2019-12-26 | Disposition: A | Discharge status: Home / Self Care | Payer: Medicaid Other | Source: Ambulatory Visit | Attending: Orthopaedic Surgery | Admitting: Orthopaedic Surgery

## 2019-12-26 DIAGNOSIS — Z20822 Contact with and (suspected) exposure to covid-19: Secondary | ICD-10-CM | POA: Diagnosis not present

## 2019-12-26 DIAGNOSIS — Z01812 Encounter for preprocedural laboratory examination: Secondary | ICD-10-CM | POA: Insufficient documentation

## 2019-12-26 LAB — BASIC METABOLIC PANEL
Anion gap: 8 (ref 5–15)
BUN: 18 mg/dL (ref 8–23)
CO2: 25 mmol/L (ref 22–32)
Calcium: 9.3 mg/dL (ref 8.9–10.3)
Chloride: 105 mmol/L (ref 98–111)
Creatinine, Ser: 1.05 mg/dL — ABNORMAL HIGH (ref 0.44–1.00)
GFR calc Af Amer: >60 mL/min (ref >60)
GFR calc non Af Amer: 57 mL/min — ABNORMAL LOW (ref >60)
Glucose, Bld: 100 mg/dL — ABNORMAL HIGH (ref 70–99)
Potassium: 3.6 mmol/L (ref 3.5–5.1)
Sodium: 138 mmol/L (ref 135–145)

## 2019-12-26 LAB — SARS CORONAVIRUS 2 (TAT 6-24 HRS): SARS Coronavirus 2: NEGATIVE

## 2019-12-29 ENCOUNTER — Other Ambulatory Visit: Payer: Self-pay | Admitting: Orthopaedic Surgery

## 2019-12-29 MED ORDER — OXYCODONE-ACETAMINOPHEN 5-325 MG PO TABS: 1.0000 | ORAL_TABLET | Freq: Three times a day (TID) | ORAL | 0 refills | Status: DC | PRN

## 2019-12-29 MED ORDER — GABAPENTIN 100 MG PO CAPS: 100.0000 mg | ORAL_CAPSULE | Freq: Three times a day (TID) | ORAL | 3 refills | Status: DC

## 2019-12-29 MED ORDER — ONDANSETRON HCL 4 MG PO TABS: 4.0000 mg | ORAL_TABLET | Freq: Three times a day (TID) | ORAL | 0 refills | Status: AC | PRN

## 2019-12-30 ENCOUNTER — Other Ambulatory Visit: Payer: Self-pay

## 2019-12-30 ENCOUNTER — Ambulatory Visit (HOSPITAL_BASED_OUTPATIENT_CLINIC_OR_DEPARTMENT_OTHER)
Admission: RE | Admit: 2019-12-30 | Discharge: 2019-12-30 | Disposition: A | Discharge status: Home / Self Care | Payer: Self-pay | Source: Ambulatory Visit | Attending: Orthopaedic Surgery | Admitting: Orthopaedic Surgery

## 2019-12-30 ENCOUNTER — Encounter (HOSPITAL_BASED_OUTPATIENT_CLINIC_OR_DEPARTMENT_OTHER)
Admission: RE | Disposition: A | Discharge status: Home / Self Care | Payer: Self-pay | Source: Ambulatory Visit | Attending: Orthopaedic Surgery

## 2019-12-30 ENCOUNTER — Ambulatory Visit (HOSPITAL_BASED_OUTPATIENT_CLINIC_OR_DEPARTMENT_OTHER): Payer: Self-pay | Admitting: Anesthesiology

## 2019-12-30 ENCOUNTER — Encounter (HOSPITAL_BASED_OUTPATIENT_CLINIC_OR_DEPARTMENT_OTHER): Payer: Self-pay | Admitting: Orthopaedic Surgery

## 2019-12-30 DIAGNOSIS — G4730 Sleep apnea, unspecified: Secondary | ICD-10-CM | POA: Insufficient documentation

## 2019-12-30 DIAGNOSIS — M1812 Unilateral primary osteoarthritis of first carpometacarpal joint, left hand: Secondary | ICD-10-CM | POA: Insufficient documentation

## 2019-12-30 DIAGNOSIS — Z833 Family history of diabetes mellitus: Secondary | ICD-10-CM | POA: Insufficient documentation

## 2019-12-30 DIAGNOSIS — K219 Gastro-esophageal reflux disease without esophagitis: Secondary | ICD-10-CM | POA: Insufficient documentation

## 2019-12-30 DIAGNOSIS — Z79899 Other long term (current) drug therapy: Secondary | ICD-10-CM | POA: Insufficient documentation

## 2019-12-30 DIAGNOSIS — E119 Type 2 diabetes mellitus without complications: Secondary | ICD-10-CM | POA: Insufficient documentation

## 2019-12-30 DIAGNOSIS — I10 Essential (primary) hypertension: Secondary | ICD-10-CM | POA: Insufficient documentation

## 2019-12-30 DIAGNOSIS — Z8249 Family history of ischemic heart disease and other diseases of the circulatory system: Secondary | ICD-10-CM | POA: Insufficient documentation

## 2019-12-30 DIAGNOSIS — Z880 Allergy status to penicillin: Secondary | ICD-10-CM | POA: Insufficient documentation

## 2019-12-30 DIAGNOSIS — Z885 Allergy status to narcotic agent status: Secondary | ICD-10-CM | POA: Insufficient documentation

## 2019-12-30 DIAGNOSIS — Z85828 Personal history of other malignant neoplasm of skin: Secondary | ICD-10-CM | POA: Insufficient documentation

## 2019-12-30 DIAGNOSIS — Z8673 Personal history of transient ischemic attack (TIA), and cerebral infarction without residual deficits: Secondary | ICD-10-CM | POA: Insufficient documentation

## 2019-12-30 DIAGNOSIS — G8929 Other chronic pain: Secondary | ICD-10-CM | POA: Insufficient documentation

## 2019-12-30 DIAGNOSIS — Z7982 Long term (current) use of aspirin: Secondary | ICD-10-CM | POA: Insufficient documentation

## 2019-12-30 DIAGNOSIS — Z8349 Family history of other endocrine, nutritional and metabolic diseases: Secondary | ICD-10-CM | POA: Insufficient documentation

## 2019-12-30 DIAGNOSIS — M545 Low back pain: Secondary | ICD-10-CM | POA: Insufficient documentation

## 2019-12-30 DIAGNOSIS — Z96642 Presence of left artificial hip joint: Secondary | ICD-10-CM | POA: Insufficient documentation

## 2019-12-30 DIAGNOSIS — E7800 Pure hypercholesterolemia, unspecified: Secondary | ICD-10-CM | POA: Insufficient documentation

## 2019-12-30 HISTORY — PX: CARPOMETACARPEL SUSPENSION PLASTY: SHX5005

## 2019-12-30 LAB — GLUCOSE, CAPILLARY
Glucose-Capillary: 89 mg/dL (ref 70–99)
Glucose-Capillary: 78 mg/dL (ref 70–99)

## 2019-12-30 SURGERY — CARPOMETACARPEL (CMC) SUSPENSION PLASTY
Anesthesia: Regional | Site: Thumb | Laterality: Left

## 2019-12-30 MED ORDER — MIDAZOLAM HCL 2 MG/2ML IJ SOLN
1.0000 mg | INTRAMUSCULAR | Status: DC | PRN
  Administered 2019-12-30: 09:00:00 2 mg via INTRAVENOUS

## 2019-12-30 MED ORDER — MIDAZOLAM HCL 2 MG/2ML IJ SOLN
INTRAMUSCULAR | Status: AC
  Filled 2019-12-30: qty 2

## 2019-12-30 MED ORDER — OXYCODONE-ACETAMINOPHEN 5-325 MG PO TABS: 1.0000 | ORAL_TABLET | Freq: Three times a day (TID) | ORAL | 0 refills | Status: DC | PRN

## 2019-12-30 MED ORDER — CLONIDINE HCL (ANALGESIA) 100 MCG/ML EP SOLN
EPIDURAL | Status: DC | PRN
  Administered 2019-12-30: 100 ug

## 2019-12-30 MED ORDER — CHLORHEXIDINE GLUCONATE 4 % EX LIQD: 60.0000 mL | Freq: Once | CUTANEOUS | Status: DC

## 2019-12-30 MED ORDER — MEPERIDINE HCL 25 MG/ML IJ SOLN: 6.2500 mg | INTRAMUSCULAR | Status: DC | PRN

## 2019-12-30 MED ORDER — PROMETHAZINE HCL 25 MG/ML IJ SOLN: 6.2500 mg | INTRAMUSCULAR | Status: DC | PRN

## 2019-12-30 MED ORDER — BUPIVACAINE HCL (PF) 0.5 % IJ SOLN
INTRAMUSCULAR | Status: DC | PRN
  Administered 2019-12-30: 40 mL via PERINEURAL

## 2019-12-30 MED ORDER — OXYCODONE HCL ER 10 MG PO T12A: 10.0000 mg | EXTENDED_RELEASE_TABLET | Freq: Two times a day (BID) | ORAL | 0 refills | Status: AC

## 2019-12-30 MED ORDER — ONDANSETRON HCL 4 MG/2ML IJ SOLN
INTRAMUSCULAR | Status: DC | PRN
  Administered 2019-12-30: 4 mg via INTRAVENOUS

## 2019-12-30 MED ORDER — FENTANYL CITRATE (PF) 100 MCG/2ML IJ SOLN
INTRAMUSCULAR | Status: AC
  Filled 2019-12-30: qty 2

## 2019-12-30 MED ORDER — LACTATED RINGERS IV SOLN: INTRAVENOUS | Status: DC

## 2019-12-30 MED ORDER — PROPOFOL 500 MG/50ML IV EMUL
INTRAVENOUS | Status: DC | PRN
  Administered 2019-12-30: 75 ug/kg/min via INTRAVENOUS

## 2019-12-30 MED ORDER — PROPOFOL 10 MG/ML IV BOLUS
INTRAVENOUS | Status: DC | PRN
  Administered 2019-12-30: 30 mg via INTRAVENOUS

## 2019-12-30 MED ORDER — CLINDAMYCIN PHOSPHATE 900 MG/50ML IV SOLN
900.0000 mg | INTRAVENOUS | Status: AC
  Administered 2019-12-30: 900 mg via INTRAVENOUS

## 2019-12-30 MED ORDER — CLINDAMYCIN PHOSPHATE 900 MG/50ML IV SOLN
INTRAVENOUS | Status: AC
  Filled 2019-12-30: qty 50

## 2019-12-30 MED ORDER — FENTANYL CITRATE (PF) 100 MCG/2ML IJ SOLN
50.0000 ug | INTRAMUSCULAR | Status: DC | PRN
  Administered 2019-12-30: 09:00:00 100 ug via INTRAVENOUS

## 2019-12-30 MED ORDER — FENTANYL CITRATE (PF) 100 MCG/2ML IJ SOLN: 25.0000 ug | INTRAMUSCULAR | Status: DC | PRN

## 2019-12-30 SURGICAL SUPPLY — 80 items
BAND INSRT 18 STRL LF DISP RB (MISCELLANEOUS) ×1
BAND RUBBER #18 3X1/16 STRL (MISCELLANEOUS) ×3 IMPLANT
BIT DRILL PASSING CMC 1/4 FLEX (BIT) IMPLANT
BLADE MINI RND TIP GREEN BEAV (BLADE) ×3 IMPLANT
BLADE SURG 15 STRL LF DISP TIS (BLADE) ×2 IMPLANT
BLADE SURG 15 STRL SS (BLADE) ×6
BNDG CMPR 9X4 STRL LF SNTH (GAUZE/BANDAGES/DRESSINGS) ×1
BNDG ELASTIC 3X5.8 VLCR STR LF (GAUZE/BANDAGES/DRESSINGS) ×3 IMPLANT
BNDG ESMARK 4X9 LF (GAUZE/BANDAGES/DRESSINGS) ×3 IMPLANT
BRUSH SCRUB EZ PLAIN DRY (MISCELLANEOUS) ×3 IMPLANT
BUTTON ALL-SUT W/BACKSTOP (Orthopedic Implant) ×2 IMPLANT
CORD BIPOLAR FORCEPS 12FT (ELECTRODE) ×3 IMPLANT
COVER BACK TABLE 60X90IN (DRAPES) ×3 IMPLANT
COVER WAND RF STERILE (DRAPES) IMPLANT
CUFF TOURN SGL QUICK 18X4 (TOURNIQUET CUFF) ×2 IMPLANT
DECANTER SPIKE VIAL GLASS SM (MISCELLANEOUS) IMPLANT
DRAPE EXTREMITY T 121X128X90 (DISPOSABLE) ×3 IMPLANT
DRAPE IMP U-DRAPE 54X76 (DRAPES) ×3 IMPLANT
DRAPE OEC MINIVIEW 54X84 (DRAPES) ×2 IMPLANT
DRAPE SURG 17X23 STRL (DRAPES) ×3 IMPLANT
DRILL PASSING CMC 1/4 FLEX (BIT) ×3
GAUZE 4X4 16PLY RFD (DISPOSABLE) IMPLANT
GAUZE SPONGE 4X4 12PLY STRL (GAUZE/BANDAGES/DRESSINGS) ×3 IMPLANT
GAUZE XEROFORM 1X8 LF (GAUZE/BANDAGES/DRESSINGS) ×3 IMPLANT
GLOVE BIO SURGEON STRL SZ 6.5 (GLOVE) ×1 IMPLANT
GLOVE BIO SURGEONS STRL SZ 6.5 (GLOVE) ×1
GLOVE BIOGEL PI IND STRL 7.0 (GLOVE) ×1 IMPLANT
GLOVE BIOGEL PI IND STRL 8 (GLOVE) IMPLANT
GLOVE BIOGEL PI INDICATOR 7.0 (GLOVE) ×2
GLOVE BIOGEL PI INDICATOR 8 (GLOVE) ×2
GLOVE ECLIPSE 7.0 STRL STRAW (GLOVE) ×3 IMPLANT
GLOVE SKINSENSE NS SZ7.5 (GLOVE) ×2
GLOVE SKINSENSE STRL SZ7.5 (GLOVE) ×1 IMPLANT
GLOVE SURG SYN 7.5  E (GLOVE) ×2
GLOVE SURG SYN 7.5 E (GLOVE) ×1 IMPLANT
GLOVE SURG SYN 7.5 PF PI (GLOVE) ×2 IMPLANT
GOWN SRG XL LVL 4 BRTHBL STRL (GOWNS) ×1 IMPLANT
GOWN STRL NON-REIN XL LVL4 (GOWNS) ×3
GOWN STRL REIN XL XLG (GOWN DISPOSABLE) ×3 IMPLANT
GOWN STRL REUS W/ TWL LRG LVL3 (GOWN DISPOSABLE) ×1 IMPLANT
GOWN STRL REUS W/ TWL XL LVL3 (GOWN DISPOSABLE) ×1 IMPLANT
GOWN STRL REUS W/TWL LRG LVL3 (GOWN DISPOSABLE) ×3
GOWN STRL REUS W/TWL XL LVL3 (GOWN DISPOSABLE) ×3
NDL HYPO 25X1 1.5 SAFETY (NEEDLE) IMPLANT
NEEDLE HYPO 25X1 1.5 SAFETY (NEEDLE) IMPLANT
NS IRRIG 1000ML POUR BTL (IV SOLUTION) ×3 IMPLANT
PACK BASIN DAY SURGERY FS (CUSTOM PROCEDURE TRAY) ×3 IMPLANT
PAD CAST 3X4 CTTN HI CHSV (CAST SUPPLIES) ×1 IMPLANT
PADDING CAST ABS 3INX4YD NS (CAST SUPPLIES)
PADDING CAST ABS 4INX4YD NS (CAST SUPPLIES) ×2
PADDING CAST ABS COTTON 3X4 (CAST SUPPLIES) IMPLANT
PADDING CAST ABS COTTON 4X4 ST (CAST SUPPLIES) ×1 IMPLANT
PADDING CAST COTTON 3X4 STRL (CAST SUPPLIES) ×3
SLEEVE SCD COMPRESS KNEE MED (MISCELLANEOUS) ×3 IMPLANT
SLING ARM FOAM STRAP MED (SOFTGOODS) ×2 IMPLANT
SPLINT FIBERGLASS 3X35 (CAST SUPPLIES) ×2 IMPLANT
STOCKINETTE 4X48 STRL (DRAPES) ×3 IMPLANT
SUCTION FRAZIER HANDLE 10FR (MISCELLANEOUS) ×2
SUCTION TUBE FRAZIER 10FR DISP (MISCELLANEOUS) IMPLANT
SUT ETHIBOND 0 MO6 C/R (SUTURE) IMPLANT
SUT ETHILON 4 0 PS 2 18 (SUTURE) ×3 IMPLANT
SUT FIBERWIRE #2 38 T-5 BLUE (SUTURE)
SUT FIBERWIRE 2-0 18 17.9 3/8 (SUTURE)
SUT MNCRL AB 4-0 PS2 18 (SUTURE) IMPLANT
SUT VIC AB 0 SH 27 (SUTURE) IMPLANT
SUT VIC AB 2-0 CT1 27 (SUTURE)
SUT VIC AB 2-0 CT1 TAPERPNT 27 (SUTURE) IMPLANT
SUT VIC AB 2-0 SH 27 (SUTURE) ×3
SUT VIC AB 2-0 SH 27XBRD (SUTURE) ×1 IMPLANT
SUTURE FIBERWR #2 38 T-5 BLUE (SUTURE) IMPLANT
SUTURE FIBERWR 2-0 18 17.9 3/8 (SUTURE) IMPLANT
SUTURE TAPE 1.3 FIBERLOP 20 ST (SUTURE) IMPLANT
SUTURETAPE 1.3 FIBERLOOP 20 ST (SUTURE)
SYR BULB 3OZ (MISCELLANEOUS) ×3 IMPLANT
SYR CONTROL 10ML LL (SYRINGE) IMPLANT
TOWEL GREEN STERILE FF (TOWEL DISPOSABLE) ×6 IMPLANT
TRAY DSU PREP LF (CUSTOM PROCEDURE TRAY) ×3 IMPLANT
TUBE CONNECTING 20'X1/4 (TUBING) ×1
TUBE CONNECTING 20X1/4 (TUBING) ×2 IMPLANT
UNDERPAD 30X36 HEAVY ABSORB (UNDERPADS AND DIAPERS) ×3 IMPLANT

## 2019-12-30 NOTE — Anesthesia Preprocedure Evaluation (Signed)
Anesthesia Evaluation  Patient identified by MRN, date of birth, ID band Patient awake    Reviewed: Allergy & Precautions, NPO status , Patient's Chart, lab work & pertinent test results  History of Anesthesia Complications (+) PONV and history of anesthetic complications  Airway Mallampati: I       Dental  (+) Edentulous Lower, Edentulous Upper   Pulmonary shortness of breath and with exertion, sleep apnea and Continuous Positive Airway Pressure Ventilation ,    Pulmonary exam normal breath sounds clear to auscultation       Cardiovascular hypertension, Pt. on medications Normal cardiovascular exam+ dysrhythmias  Rhythm:Regular Rate:Normal     Neuro/Psych  Headaches, CVA, No Residual Symptoms negative psych ROS   GI/Hepatic GERD  ,  Endo/Other  diabetesHyperlipidemia  Renal/GU Hx/o renal calculi  negative genitourinary   Musculoskeletal  (+) Arthritis , Osteoarthritis,  Severe OA left hip Chronic low back pain   Abdominal Normal abdominal exam  (+)   Peds  Hematology negative hematology ROS (+)   Anesthesia Other Findings   Reproductive/Obstetrics negative OB ROS                             Anesthesia Physical  Anesthesia Plan  ASA: III  Anesthesia Plan: Regional   Post-op Pain Management:    Induction: Intravenous  PONV Risk Score and Plan: 3 and Ondansetron, Propofol infusion, Dexamethasone and Treatment may vary due to age or medical condition  Airway Management Planned: Simple Face Mask and Natural Airway  Additional Equipment: None  Intra-op Plan:   Post-operative Plan:   Informed Consent: I have reviewed the patients History and Physical, chart, labs and discussed the procedure including the risks, benefits and alternatives for the proposed anesthesia with the patient or authorized representative who has indicated his/her understanding and acceptance.     Dental  advisory given  Plan Discussed with: CRNA  Anesthesia Plan Comments:         Anesthesia Quick Evaluation

## 2019-12-30 NOTE — Anesthesia Procedure Notes (Signed)
Anesthesia Regional Block: Axillary brachial plexus block   Pre-Anesthetic Checklist: ,, timeout performed, Correct Patient, Correct Site, Correct Laterality, Correct Procedure, Correct Position, site marked, Risks and benefits discussed,  Surgical consent,  Pre-op evaluation,  At surgeon's request and post-op pain management  Laterality: Left  Prep: chloraprep       Needles:  Injection technique: Single-shot  Needle Type: Stimulator Needle - 40     Needle Length: 4cm  Needle Gauge: 22     Additional Needles:   Procedures:,,,, ultrasound used (permanent image in chart),,,,  Narrative:  Start time: 12/30/2019 9:22 AM End time: 12/30/2019 9:27 AM Injection made incrementally with aspirations every 5 mL. Anesthesiologist: Nolon Nations, MD  Additional Notes: BP cuff, EKG monitors applied. Sedation begun. Nerve location verified with U/S. Anesthetic injected incrementally, slowly , and after neg aspirations under direct u/s guidance. Good perineural spread. Tolerated well.

## 2019-12-30 NOTE — H&P (Signed)
PREOPERATIVE H&P  Chief Complaint: left thumb carpometacarpal osteoarthritis  HPI: Natalie Bond is a 64 y.o. female who presents for surgical treatment of left thumb carpometacarpal osteoarthritis.  She denies any changes in medical history.  Past Medical History:  Diagnosis Date  . Arthritis   . Arthritis    back, wrists  . Cancer ([place])    skin cancer  . Carpal tunnel syndrome   . Complication of anesthesia   . DDD (degenerative disc disease)   . Diabetes mellitus without complication ([place])    does not take meds  . Dysrhythmia 1995   irreg hr  . Full dentures   . GERD (gastroesophageal reflux disease)   . Headache   . High cholesterol   . History of bronchitis   . History of kidney stones    2015  . Hypertension   . PONV (postoperative nausea and vomiting)   . Sleep apnea    pt. does not use a CPAP or BiPAP  . Stroke ([place])    Pt. states possible mini stroke in 2014, no deficits   Past Surgical History:  Procedure Laterality Date  . ABDOMINAL HYSTERECTOMY    . BACK SURGERY  2004   [id number]  . BACK SURGERY    . CARDIAC CATHETERIZATION     01/27/15 [facility]): EF 60%, normal coronaries.  . CARPAL TUNNEL RELEASE  09/20/2012   Procedure: CARPAL TUNNEL RELEASE;  Surgeon: Cammie Sickle., MD;  Location: [place];  Service: Orthopedics;  Laterality: Left;  . CARPOMETACARPEL SUSPENSION PLASTY Right 12/12/2018   Procedure: RIGHT THUMB CARPOMETACARPAL [facility]) ARTHROPLASTY;  Surgeon: Leandrew Koyanagi, MD;  Location: [place];  Service: Orthopedics;  Laterality: Right;  . CATARACT EXTRACTION, BILATERAL    . CHOLECYSTECTOMY  07/2016   done [place]  . DORSAL COMPARTMENT RELEASE  09/20/2012   Procedure: RELEASE DORSAL COMPARTMENT (DEQUERVAIN);  Surgeon: Cammie Sickle., MD;  Location: [facility];  Service: Orthopedics;  Laterality: Left;  . EYE SURGERY     bilat cataracts  . EYE SURGERY    . HERNIA REPAIR Left 1990   left groin   . LOWER EXTREMITY ANGIOGRAM Left 06/08/2015   Procedure: Ascending Venogram  Iliac Vein ;  Surgeon: Elam Dutch, MD;  Location: [facility] OR;  Service: Vascular;  Laterality: Left;  ;  TARA- [place] [employer] / ZACH-[place] HAVE BEEN NOTIFIED/ CONFIRMED  . PARTIAL HYSTERECTOMY    . SHOULDER SURGERY    . TOTAL HIP ARTHROPLASTY Left 10/21/2016   Procedure: LEFT TOTAL HIP ARTHROPLASTY ANTERIOR APPROACH;  Surgeon: Mcarthur Rossetti, MD;  Location: [facility];  Service: Orthopedics;  Laterality: Left;  . WRIST SURGERY Bilateral    carpal tunnel   Social History   Socioeconomic History  . Marital status: Married    Spouse name: marty  . Number of children: 2  . Years of education: college  . Highest education level: Not on file  Occupational History  . Occupation: braxton Therapist, art: [employer]  Tobacco Use  . Smoking status: Never Smoker  . Smokeless tobacco: Never Used  Substance and Sexual Activity  . Alcohol use: No  . Drug use: No  . Sexual activity: Not on file  Other Topics Concern  . Not on file  Social History Narrative   ** Merged History Encounter **       Social Determinants of Health   Financial Resource Strain:   .  Difficulty of Paying Living Expenses: Not on file  Food Insecurity:   . Worried About Charity fundraiser in the Last Year: Not on file  . Ran Out of Food in the Last Year: Not on file  Transportation Needs:   . Lack of Transportation (Medical): Not on file  . Lack of Transportation (Non-Medical): Not on file  Physical Activity:   . Days of Exercise per Week: Not on file  . Minutes of Exercise per Session: Not on file  Stress:   . Feeling of Stress : Not on file  Social Connections:   . Frequency of Communication with Friends and Family: Not on file  . Frequency of Social Gatherings with Friends and Family: Not on file  . Attends Religious Services: Not on file  . Active Member of Clubs or Organizations: Not on file  .  Attends Archivist Meetings: Not on file  . Marital Status: Not on file   Family History  Problem Relation Age of Onset  . Cancer Mother   . Diabetes Mother   . Heart disease Mother   . Hyperlipidemia Mother   . Hypertension Mother   . Cancer Father   . Diabetes Father   . Heart disease Father   . Hyperlipidemia Father   . Hypertension Father   . Varicose Veins Father   . Cancer Sister   . Heart disease Sister   . Hyperlipidemia Sister   . Cancer Brother   . Diabetes Brother   . Hyperlipidemia Brother    Allergies  Allergen Reactions  . Dilaudid [Hydromorphone Hcl] Anaphylaxis  . Dilaudid [Hydromorphone Hcl] Anaphylaxis    [name]   . Penicillins Anaphylaxis and Rash    Has patient had a PCN reaction causing immediate rash, facial/tongue/throat swelling, SOB or lightheadedness with hypotension: Yes Has patient had a PCN reaction causing severe rash involving mucus membranes or skin necrosis: No Has patient had a PCN reaction that required hospitalization Yes Has patient had a PCN reaction occurring within the last 10 years: No If all of the above answers are "NO", then may proceed with Cephalosporin use.   Marland Kitchen Penicillins Hives   Prior to Admission medications   Medication Sig Start Date End Date Taking? Authorizing Provider  aspirin 81 MG tablet Take 81 mg by mouth daily.     Yes [provider]  atorvastatin (LIPITOR) 20 MG tablet Take 1 tablet (20 mg total) by mouth daily at 6 PM. 05/29/19  Yes Black, Lezlie Octave, NP  butalbital-acetaminophen-caffeine (FIORICET) 50-325-40 MG tablet Take 1 tablet by mouth every 12 (twelve) hours as needed for headache. 05/29/19  Yes Black, Lezlie Octave, NP  cloNIDine (CATAPRES) 0.1 MG tablet Take 0.1 mg by mouth 2 (two) times daily as needed (Blood pressure is over 160). If BP is over 160   Yes [provider]  cyclobenzaprine (FLEXERIL) 10 MG tablet Take 10 mg by mouth 3 (three) times daily as needed.    Yes [provider]  gabapentin (NEURONTIN) 100 MG capsule Take 1 capsule (100 mg total) by mouth at bedtime. 05/04/17  Yes Jessy Oto, MD  HYDROcodone-acetaminophen (NORCO/VICODIN) 5-325 MG tablet Take 1 tablet by mouth every 6 (six) hours as needed. 11/18/19  Yes Mcarthur Rossetti, MD  ibuprofen (ADVIL,MOTRIN) 800 MG tablet Take 1 tablet (800 mg total) by mouth every 8 (eight) hours as needed. 01/09/19  Yes Aundra Dubin, PA-C  magnesium 30 MG tablet Take 30 mg  by mouth 2 (two) times daily.   Yes [provider]  metoprolol (TOPROL-XL) 50 MG 24 hr tablet Take 50 mg by mouth daily.     Yes [provider]  nortriptyline (PAMELOR) 25 MG capsule Take 25 mg by mouth at bedtime.   Yes [provider]  omeprazole (PRILOSEC) 20 MG capsule Take 20 mg by mouth daily.   Yes [provider]  Potassium 75 MG TABS Take 75 mg by mouth daily.    Yes [provider]  telmisartan-hydrochlorothiazide (MICARDIS HCT) 80-12.5 MG tablet Take 1 tablet by mouth daily. 03/18/19  Yes [provider]  topiramate (TOPAMAX) 25 MG tablet Take 25 mg by mouth at bedtime. 08/08/16  Yes [provider]  venlafaxine (EFFEXOR) 75 MG tablet Take 75 mg by mouth at bedtime as needed.    Yes [provider]  gabapentin (NEURONTIN) 100 MG capsule Take 1 capsule (100 mg total) by mouth 3 (three) times daily. 12/29/19   Leandrew Koyanagi, MD  ondansetron (ZOFRAN) 4 MG tablet Take 1-2 tablets (4-8 mg total) by mouth every 8 (eight) hours as needed for nausea or vomiting. 12/12/18   Leandrew Koyanagi, MD  ondansetron (ZOFRAN) 4 MG tablet Take 1-2 tablets (4-8 mg total) by mouth every 8 (eight) hours as needed for nausea or vomiting. 12/29/19   Leandrew Koyanagi, MD  oxyCODONE-acetaminophen (PERCOCET) 5-325 MG tablet Take 1-2 tablets by mouth every 8 (eight) hours as needed for severe pain. 12/29/19   Leandrew Koyanagi, MD  predniSONE (DELTASONE) 50 MG tablet Take 1 tablet  daily with food for 5 days until finished 12/24/19   Magnus Sinning, MD     Positive ROS: All other systems have been reviewed and were otherwise negative with the exception of those mentioned in the HPI and as above.  Physical Exam: General: Alert, no acute distress Cardiovascular: No pedal edema Respiratory: No cyanosis, no use of accessory musculature GI: abdomen soft Skin: No lesions in the area of chief complaint Neurologic: Sensation intact distally Psychiatric: Patient is competent for consent with normal mood and affect Lymphatic: no lymphedema  MUSCULOSKELETAL: exam stable  Assessment: left thumb carpometacarpal osteoarthritis  Plan: Plan for Procedure(s): LEFT THUMB CARPOMETACARPAL ([facility]) ARTHROPLASTY  The risks benefits and alternatives were discussed with the patient including but not limited to the risks of nonoperative treatment, versus surgical intervention including infection, bleeding, nerve injury,  blood clots, cardiopulmonary complications, morbidity, mortality, among others, and they were willing to proceed.   Eduard Roux, MD   12/30/2019 7:15 AM

## 2019-12-30 NOTE — Op Note (Signed)
   DATE OF SURGERY:12/30/2019  PREOPERATIVE DIAGNOSIS: Left thumb basal joint arthritis.  POSTOPERATIVE DIAGNOSIS: same.  PROCEDURE:  1. Left thumb carpometacarpal interposition arthroplasty ([id number]); thumb     IMPLANTS: Conmed microlink  SURGEON: N. Eduard Roux, MD  ASSIST: Madalyn Rob, PA-C; necessary for the timely completion of procedure and due to complexity of procedure.  ANESTHESIA: regional and MAC  TOURNIQUET TIME: less than 60 mins.  BLOOD LOSS: Minimal.  COMPLICATIONS: None.  PATHOLOGY: None.  TIME OUT: A time out was performed before the procedure started.  INDICATIONS FOR PROCEDURE: The patient presents today for the above mentioned procedure after failing extensive conservative measures.  The risks, benefits, and alternatives to surgery were discussed and the patient elected to proceed with surgery.  DESCRIPTION OF PROCEDURE: A Wagner's incision was made. The superficial radial nerve was identified and protected. Radial artery was exposed and protected. We dissected sharply in between the extensor pollicis brevis and abductor pollicis longus tendon interval. Capsulotomy was performed. Capsular flaps were raised. Fluoroscopy was used to identify the borders of the trapezium.  Trapezium was exposed and removed in a piecemeal fashion.  I then made a second incision on the ulnar border of the second metacarpal base and blunt dissection was taken down to the cortex.  Retractors were placed.  We then placed the targeting guide for drilling the micro link device.  Fluoroscopy was used to confirm the correct trajectory.  The pin was then drilled from the second metacarpal into the base of the first metacarpal in a bicortical fashion for both metacarpals.  The pin was then pulled across with a Vicryl suture which was then used to deliver the micro link from a radial to ulnar direction.  The suture button was then pulled down tight against the base of the first metacarpal  and a second suture button was delivered along the second metacarpal and tightened provisionally which was then checked under fluoroscopy to restore Shenton's line.  Movement of the thumb also was performed to confirm appropriate range of motion.  Additional suture knots were performed.  The surgical wounds were then thoroughly irrigated. The capsular tissue was repaired using 2-0 Vicryl sutures. Then tourniquet was deflated. Hemostasis achieved. Wounds were irrigated and closed using 4-0 nylon sutures. Sterile dressing applied, hand immobilized in a thumb spica splint. The patient then was transferred to the recovery room in stable condition after all counts were correct.  POSTOPERATIVE PLAN: Return in two weeks for suture removal and application of a thumb spica cast. Four weeks after surgery cast will be removed and switch to a thumb spica brace and start hand therapy.

## 2019-12-30 NOTE — Progress Notes (Signed)
Assisted Dr. Germeroth with left, ultrasound guided, axillary block. Side rails up, monitors on throughout procedure. See vital signs in flow sheet. Tolerated Procedure well. 

## 2019-12-30 NOTE — Discharge Instructions (Signed)
 Postoperative instructions:  Weightbearing instructions: non weight bearing  Dressing instructions: Keep your dressing and/or splint clean and dry at all times.  It will be removed at your first post-operative appointment.  Your stitches and/or staples will be removed at this visit.  Incision instructions:  Do not soak your incision for 3 weeks after surgery.  If the incision gets wet, pat dry and do not scrub the incision.  Pain control:  You have been given a prescription to be taken as directed for post-operative pain control.  In addition, elevate the operative extremity above the heart at all times to prevent swelling and throbbing pain.  Take over-the-counter Colace, 100mg by mouth twice a day while taking narcotic pain medications to help prevent constipation.  Follow up appointments: 1) 14 days for suture removal and wound check. 2) Dr. Xu as scheduled.   -------------------------------------------------------------------------------------------------------------  After Surgery Pain Control:  After your surgery, post-surgical discomfort or pain is likely. This discomfort can last several days to a few weeks. At certain times of the day your discomfort may be more intense.  Did you receive a nerve block?  A nerve block can provide pain relief for one hour to two days after your surgery. As long as the nerve block is working, you will experience little or no sensation in the area the surgeon operated on.  As the nerve block wears off, you will begin to experience pain or discomfort. It is very important that you begin taking your prescribed pain medication before the nerve block fully wears off. Treating your pain at the first sign of the block wearing off will ensure your pain is better controlled and more tolerable when full-sensation returns. Do not wait until the pain is intolerable, as the medicine will be less effective. It is better to treat pain in advance than to try and  catch up.  General Anesthesia:  If you did not receive a nerve block during your surgery, you will need to start taking your pain medication shortly after your surgery and should continue to do so as prescribed by your surgeon.  Pain Medication:  Most commonly we prescribe Vicodin and Percocet for post-operative pain. Both of these medications contain a combination of acetaminophen (Tylenol) and a narcotic to help control pain.   It takes between 30 and 45 minutes before pain medication starts to work. It is important to take your medication before your pain level gets too intense.   Nausea is a common side effect of many pain medications. You will want to eat something before taking your pain medicine to help prevent nausea.   If you are taking a prescription pain medication that contains acetaminophen, we recommend that you do not take additional over the counter acetaminophen (Tylenol).  Other pain relieving options:   Using a cold pack to ice the affected area a few times a day (15 to 20 minutes at a time) can help to relieve pain, reduce swelling and bruising.   Elevation of the affected area can also help to reduce pain and swelling.   Post Anesthesia Home Care Instructions  Activity: Get plenty of rest for the remainder of the day. A responsible individual must stay with you for 24 hours following the procedure.  For the next 24 hours, DO NOT: -Drive a car -Operate machinery -Drink alcoholic beverages -Take any medication unless instructed by your physician -Make any legal decisions or sign important papers.  Meals: Start with liquid foods such as gelatin or   soup. Progress to regular foods as tolerated. Avoid greasy, spicy, heavy foods. If nausea and/or vomiting occur, drink only clear liquids until the nausea and/or vomiting subsides. Call your physician if vomiting continues. ° °Special Instructions/Symptoms: °Your throat may feel dry or sore from the anesthesia or the  breathing tube placed in your throat during surgery. If this causes discomfort, gargle with warm salt water. The discomfort should disappear within 24 hours. ° °If you had a scopolamine patch placed behind your ear for the management of post- operative nausea and/or vomiting: ° °1. The medication in the patch is effective for 72 hours, after which it should be removed.  Wrap patch in a tissue and discard in the trash. Wash hands thoroughly with soap and water. °2. You may remove the patch earlier than 72 hours if you experience unpleasant side effects which may include dry mouth, dizziness or visual disturbances. °3. Avoid touching the patch. Wash your hands with soap and water after contact with the patch. °   °Regional Anesthesia Blocks ° °1. Numbness or the inability to move the "blocked" extremity may last from 3-48 hours after placement. The length of time depends on the medication injected and your individual response to the medication. If the numbness is not going away after 48 hours, call your surgeon. ° °2. The extremity that is blocked will need to be protected until the numbness is gone and the  Strength has returned. Because you cannot feel it, you will need to take extra care to avoid injury. Because it may be weak, you may have difficulty moving it or using it. You may not know what position it is in without looking at it while the block is in effect. ° °3. For blocks in the legs and feet, returning to weight bearing and walking needs to be done carefully. You will need to wait until the numbness is entirely gone and the strength has returned. You should be able to move your leg and foot normally before you try and bear weight or walk. You will need someone to be with you when you first try to ensure you do not fall and possibly risk injury. ° °4. Bruising and tenderness at the needle site are common side effects and will resolve in a few days. ° °5. Persistent numbness or new problems with movement  should be communicated to the surgeon or the [facility] ([phone])/ [facility] ([phone]). °

## 2019-12-30 NOTE — Transfer of Care (Signed)
Immediate Anesthesia Transfer of Care Note  [name]  Procedure(s) Performed: LEFT THUMB CARPOMETACARPAL ([place]) ARTHROPLASTY (Left Thumb)  Patient Location: PACU  Anesthesia Type:MAC combined with regional for post-op pain  Level of Consciousness: awake, alert  and oriented  Airway & Oxygen Therapy: Patient Spontanous Breathing and Patient connected to face mask oxygen  Post-op Assessment: Report given to RN and Post -op Vital signs reviewed and stable  Post vital signs: Reviewed and stable  Last Vitals:  Vitals Value Taken Time  BP 134/69 12/30/19 1155  Temp    Pulse 54 12/30/19 1157  Resp 11 12/30/19 1157  SpO2 99 % 12/30/19 1157  Vitals shown include unvalidated device data.  Last Pain:  Vitals:   12/30/19 0829  TempSrc: Temporal  PainSc: 3          Complications: No apparent anesthesia complications

## 2019-12-30 NOTE — Anesthesia Postprocedure Evaluation (Signed)
Anesthesia Post Note  [name]  Procedure(s) Performed: LEFT THUMB CARPOMETACARPAL ([place]) ARTHROPLASTY (Left Thumb)     Patient location during evaluation: PACU Anesthesia Type: Regional Level of consciousness: awake and alert Pain management: pain level controlled Vital Signs Assessment: post-procedure vital signs reviewed and stable Respiratory status: spontaneous breathing Cardiovascular status: stable Anesthetic complications: no    Last Vitals:  Vitals:   12/30/19 1238 12/30/19 1251  BP:  (!) 156/81  Pulse: (!) 53 (!) 58  Resp: 18 16  Temp:  36.6 C  SpO2: 94% 99%    Last Pain:  Vitals:   12/30/19 1251  TempSrc:   PainSc: 0-No pain                 Nolon Nations

## 2019-12-31 ENCOUNTER — Ambulatory Visit: Payer: Self-pay | Admitting: Physical Medicine and Rehabilitation

## 2019-12-31 ENCOUNTER — Telehealth: Payer: Self-pay | Admitting: Orthopaedic Surgery

## 2019-12-31 ENCOUNTER — Encounter: Payer: Self-pay | Admitting: *Deleted

## 2019-12-31 NOTE — Telephone Encounter (Signed)
Patient called requesting new medication. Patient claims medication is to strong. Patient is asking hydrocodone is better. Patient asking for new medication to be sent to pharmacy [facility]. Patient phone number is [phone]

## 2020-01-01 ENCOUNTER — Other Ambulatory Visit: Payer: Self-pay | Admitting: Physician Assistant

## 2020-01-01 MED ORDER — HYDROCODONE-ACETAMINOPHEN 5-325 MG PO TABS: 1.0000 | ORAL_TABLET | Freq: Three times a day (TID) | ORAL | 0 refills | Status: DC | PRN

## 2020-01-01 NOTE — Telephone Encounter (Signed)
Please advise 

## 2020-01-01 NOTE — Telephone Encounter (Signed)
Ok, I just sent in

## 2020-01-02 ENCOUNTER — Other Ambulatory Visit: Payer: Self-pay | Admitting: Physician Assistant

## 2020-01-06 ENCOUNTER — Other Ambulatory Visit: Payer: Self-pay

## 2020-01-06 ENCOUNTER — Encounter: Payer: Self-pay | Admitting: Physical Medicine and Rehabilitation

## 2020-01-06 ENCOUNTER — Ambulatory Visit: Payer: Self-pay

## 2020-01-06 ENCOUNTER — Ambulatory Visit (INDEPENDENT_AMBULATORY_CARE_PROVIDER_SITE_OTHER): Payer: Self-pay | Admitting: Physical Medicine and Rehabilitation

## 2020-01-06 DIAGNOSIS — M7072 Other bursitis of hip, left hip: Secondary | ICD-10-CM

## 2020-01-06 NOTE — Progress Notes (Signed)
   Numeric Pain Rating Scale and Functional Assessment Average Pain (6)   In the last MONTH (on 0-10 scale) has pain interfered with the following?  1. General activity like being  able to carry out your everyday physical activities such as walking, climbing stairs, carrying groceries, or moving a chair?  Rating(8)   +Driver, -BT, -Dye Allergies.

## 2020-01-07 MED ORDER — BUPIVACAINE HCL 0.25 % IJ SOLN
4.0000 mL | INTRAMUSCULAR | Status: AC | PRN
  Administered 2020-01-06: 15:00:00 4 mL via INTRA_ARTICULAR

## 2020-01-07 MED ORDER — TRIAMCINOLONE ACETONIDE 40 MG/ML IJ SUSP
40.0000 mg | INTRAMUSCULAR | Status: AC | PRN
  Administered 2020-01-06: 15:00:00 40 mg via INTRA_ARTICULAR

## 2020-01-07 NOTE — Progress Notes (Signed)
Natalie Bond - 64 y.o. female MRN [id number]  Date of birth: 18-Jan-1956  Office Visit Note: Visit Date: 01/06/2020 PCP: Leanna Battles, MD Referred by: Leanna Battles, MD  Subjective: Chief Complaint  Patient presents with  . Left Hip - Pain   HPI:  Natalie Bond is a 64 y.o. female who comes in today For planned left ischial bursa injection under fluoroscopic guidance.  The patient has failed conservative care including home exercise, medications, time and activity modification.  This injection will be diagnostic and hopefully therapeutic.  Please see requesting physician notes for further details and justification. Patient's course complicated by history of type 2 diabetes and stroke as well as left total hip replacement.  ROS Otherwise per HPI.  Assessment & Plan: Visit Diagnoses:  1. Ischial bursitis of left side     Plan: No additional findings.   Meds & Orders: No orders of the defined types were placed in this encounter.   Orders Placed This Encounter  Procedures  . Large Joint Inj  . XR C-ARM NO REPORT    Follow-up: Return if symptoms worsen or fail to improve.   Procedures: Large Joint Inj (Left ischial bursa) on 01/06/2020 3:00 PM Indications: diagnostic evaluation and pain Details: 22 G 3.5 in needle, fluoroscopy-guided anterior approach  Arthrogram: No  Medications: 4 mL bupivacaine 0.25 %; 40 mg triamcinolone acetonide 40 MG/ML Outcome: tolerated well, no immediate complications  There was excellent flow of contrast outlining the ischial bursa without intravascular flow. Procedure, treatment alternatives, risks and benefits explained, specific risks discussed. Consent was given by the patient. Immediately prior to procedure a time out was called to verify the correct patient, procedure, equipment, support staff and site/side marked as required. Patient was prepped and draped in the usual sterile fashion.      No notes on file   Clinical  History: HISTORY: Bilateral hip pain  TECHNIQUE: 2 view bilateral hip study  FINDINGS: Right hip shows no acute osseous abnormalities. Dystrophic calcifications are seen in the soft tissues adjacent to the greater trochanter most likely age related but could represent old injury. No articular irregularities. Unremarkable SI  joints.  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 No evidence of loosening or periarticular fractures. Alignment unremarkable. Unremarkable left SI joint Other Result Information Acute Interface, Incoming Rad Results - 11/11/2019  4:06 PM EST HISTORY: Bilateral hip pain  TECHNIQUE: 2 view bilateral hip study  FINDINGS: Right hip shows no acute osseous abnormalities. Dystrophic calcifications are seen in the soft tissues adjacent to the greater trochanter most likely age related but could represent old injury. No articular irregularities. Unremarkable SI  joints.  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 No evidence of loosening or periarticular fractures. Alignment unremarkable. Unremarkable left SI joint   IMPRESSION: No acute osseous or soft tissue findings.  Electronically Signed by: Satira Sark   ----- MRI LUMBAR SPINE WITHOUT CONTRAST    TECHNIQUE:  Multiplanar, multisequence MR imaging of the lumbar spine was  performed. No intravenous contrast was administered.    COMPARISON: 12/18/2015 lumbar spine MRI.    FINDINGS:  Segmentation: Standard.    Alignment: Mild lumbar levocurvature with apex at L3. Normal lumbar  lordosis without listhesis.    Vertebrae: No fracture, evidence of discitis, or bone lesion.  Right-sided L4-5 facet edema.    Conus medullaris and cauda equina: Conus extends to the L1-2 level.  Conus and cauda equina appear normal.    Paraspinal and other soft tissues: Chronic  postsurgical changes  related to left L5-S1 hemilaminectomy. No fluid collection.    Disc levels:    T12-L1: No significant disc  displacement, foraminal stenosis, or  canal stenosis.    L1-2: No significant disc displacement, foraminal stenosis, or canal  stenosis.    L2-3: Stable small left foraminal disc protrusion. No significant  foraminal or canal stenosis.    L3-4: Stable disc bulge and facet hypertrophy. Interval resorption  of right juxta-articular cyst. Mild bilateral recess stenosis. No  foraminal or canal stenosis.    L4-5: Stable disc bulge, facet hypertrophy, and ligamentum flavum  hypertrophy, greater on the left. Mild left lateral recess stenosis.  No foraminal or canal stenosis.    L5-S1: Left hemilaminectomy. Stable left central disc protrusion  with mild left lateral recess stenosis. No significant foraminal or  canal stenosis.    IMPRESSION:  1. No acute fracture. Mild levocurvature of the lumbar spine.  2. Right L4-5 facet edema, likely degenerative.  3. Stable lumbar spondylosis. No significant foraminal or canal  stenosis.      Electronically Signed  By: Kristine Garbe M.D.  On: 12/01/2018 14:02     Objective:  VS:  HT:    WT:   BMI:     BP:   HR: bpm  TEMP: ( )  RESP:  Physical Exam  Ortho Exam Imaging: XR C-ARM NO REPORT  Result Date: 01/06/2020 Please see Notes tab for imaging impression.

## 2020-01-14 ENCOUNTER — Ambulatory Visit (INDEPENDENT_AMBULATORY_CARE_PROVIDER_SITE_OTHER): Payer: Self-pay | Admitting: Orthopaedic Surgery

## 2020-01-14 ENCOUNTER — Encounter: Payer: Self-pay | Admitting: Orthopaedic Surgery

## 2020-01-14 ENCOUNTER — Other Ambulatory Visit: Payer: Self-pay

## 2020-01-14 ENCOUNTER — Ambulatory Visit (INDEPENDENT_AMBULATORY_CARE_PROVIDER_SITE_OTHER): Payer: Self-pay

## 2020-01-14 DIAGNOSIS — M79642 Pain in left hand: Secondary | ICD-10-CM

## 2020-01-14 DIAGNOSIS — M1812 Unilateral primary osteoarthritis of first carpometacarpal joint, left hand: Secondary | ICD-10-CM

## 2020-01-14 MED ORDER — HYDROCODONE-ACETAMINOPHEN 5-325 MG PO TABS: 1.0000 | ORAL_TABLET | Freq: Two times a day (BID) | ORAL | 0 refills | Status: DC | PRN

## 2020-01-14 NOTE — Progress Notes (Signed)
Post-Op Visit Note   [name]           Date of Birth: 11-14-1956           MRN: [id number] Visit Date: 01/14/2020 PCP: Leanna Battles, MD   Assessment & Plan:  Chief Complaint:  Chief Complaint  Patient presents with  . Left Thumb - Follow-up, Routine Post Op   Visit Diagnoses:  1. Pain in left hand   2. Primary osteoarthritis of first carpometacarpal joint of left hand     Plan: Patient is a pleasant 64 year old female who comes in today 2 weeks out left [facility] arthroplasty 12/30/2019.  She has been doing fairly well.  No fevers or chills.  Moderate pain which is relieved with narcotic pain medicine.  Examination of her left wrist reveals well-healing surgical incisions with nylon sutures in place.  There is 1 area to the proximal radial incision with slight bloody drainage.  No erythema.  She is neurovascularly intact distally.  Today, nylon sutures were removed and Steri-Strips applied.  He was placed the patient in a thumb spica splint for the next 2 weeks.  She will elevate for pain and swelling.  Follow-up with [place] in 2 weeks time for transition into a removable thumb spica splint and start hand physical therapy.  At that point, we will also write for a thermoplastic splint.  Follow-Up Instructions: Return in about 2 weeks (around 01/28/2020).   Orders:  Orders Placed This Encounter  Procedures  . XR Hand Complete Left   Meds ordered this encounter  Medications  . HYDROcodone-acetaminophen (NORCO) 5-325 MG tablet    Sig: Take 1-2 tablets by mouth 2 (two) times daily as needed for moderate pain.    Dispense:  20 tablet    Refill:  0    Imaging: XR Hand Complete Left  Result Date: 01/14/2020 Status post [facility] arthroplasty without any complications   PMFS History: Patient Active Problem List   Diagnosis Date Noted  . Primary osteoarthritis of first carpometacarpal joint of left hand 12/24/2019  . TIA (transient ischemic attack) 05/29/2019  .  Anterolisthesis 05/29/2019  . Thyroid nodule 05/29/2019  . Stroke (cerebrum) ([place]) 05/27/2019  . Right arm weakness 05/27/2019  . Type 2 diabetes mellitus without complication ([place]) 99991111  . Arthritis of carpometacarpal [facility]) joint of right thumb 04/16/2019  . Mixed conductive and sensorineural hearing loss of left ear with restricted hearing of right ear 04/26/2018  . Vertigo 04/26/2018  . Venous stasis dermatitis of left lower extremity 04/09/2018  . Achilles tendon contracture, left 04/09/2018  . Pain in left ankle and joints of left foot 04/09/2018  . Primary osteoarthritis of first carpometacarpal joint of right hand 10/09/2017  . Bilateral hand pain 10/05/2017  . Primary osteoarthritis of both first carpometacarpal joints 10/05/2017  . Trochanteric bursitis, right hip 06/29/2017  . Degenerative disc disease at L5-S1 level 05/04/2017  . History of joint replacement 05/04/2017  . Claw toe, acquired, right 04/27/2017  . Achilles tendon contracture, right 04/27/2017  . Pain in right foot 04/11/2017  . Status post left hip replacement 10/21/2016  . Chronic back pain 01/14/2016  . Migraine 01/14/2016  . Essential hypertension 01/22/2015   Past Medical History:  Diagnosis Date  . Arthritis   . Arthritis    back, wrists  . Cancer ([place])    skin cancer  . Carpal tunnel syndrome   . Complication of anesthesia   . DDD (degenerative disc disease)   . Diabetes  mellitus without complication ([place])    does not take meds  . Dysrhythmia 1995   irreg hr  . Full dentures   . GERD (gastroesophageal reflux disease)   . Headache   . High cholesterol   . History of bronchitis   . History of kidney stones    2015  . Hypertension   . PONV (postoperative nausea and vomiting)   . Sleep apnea    pt. does not use a CPAP or BiPAP  . Stroke ([place])    Pt. states possible mini stroke in 2014, no deficits    Family History  Problem Relation Age of Onset  . Cancer Mother   . Diabetes  Mother   . Heart disease Mother   . Hyperlipidemia Mother   . Hypertension Mother   . Cancer Father   . Diabetes Father   . Heart disease Father   . Hyperlipidemia Father   . Hypertension Father   . Varicose Veins Father   . Cancer Sister   . Heart disease Sister   . Hyperlipidemia Sister   . Cancer Brother   . Diabetes Brother   . Hyperlipidemia Brother     Past Surgical History:  Procedure Laterality Date  . ABDOMINAL HYSTERECTOMY    . BACK SURGERY  2004   x2lumb-lam  . BACK SURGERY    . CARDIAC CATHETERIZATION     01/27/15 [facility]): EF 60%, normal coronaries.  . CARPAL TUNNEL RELEASE  09/20/2012   Procedure: CARPAL TUNNEL RELEASE;  Surgeon: Cammie Sickle., MD;  Location: [place];  Service: Orthopedics;  Laterality: Left;  . CARPOMETACARPEL SUSPENSION PLASTY Right 12/12/2018   Procedure: RIGHT THUMB CARPOMETACARPAL [facility]) ARTHROPLASTY;  Surgeon: Leandrew Koyanagi, MD;  Location: [place];  Service: Orthopedics;  Laterality: Right;  . CARPOMETACARPEL SUSPENSION PLASTY Left 12/30/2019   Procedure: LEFT THUMB CARPOMETACARPAL ([place]) ARTHROPLASTY;  Surgeon: Leandrew Koyanagi, MD;  Location: [place];  Service: Orthopedics;  Laterality: Left;  . CATARACT EXTRACTION, BILATERAL    . CHOLECYSTECTOMY  07/2016   done [place]  . DORSAL COMPARTMENT RELEASE  09/20/2012   Procedure: RELEASE DORSAL COMPARTMENT ([place]);  Surgeon: Cammie Sickle., MD;  Location[facility];  Service: Orthopedics;  Laterality: Left;  . EYE SURGERY     bilat cataracts  . EYE SURGERY    . HERNIA REPAIR Left 1990   left groin   . LOWER EXTREMITY ANGIOGRAM Left 06/08/2015   Procedure: Ascending Venogram  Iliac Vein ;  Surgeon: Elam Dutch, MD;  [facility];  Service: Vascular;  Laterality: Left;  ;  [facility] / [facility] HAVE BEEN NOTIFIED/ CONFIRMED  . PARTIAL HYSTERECTOMY    . SHOULDER SURGERY    . TOTAL HIP  ARTHROPLASTY Left 10/21/2016   Procedure: LEFT TOTAL HIP ARTHROPLASTY ANTERIOR APPROACH;  Surgeon: Mcarthur Rossetti, MD;  [facility];  Service: Orthopedics;  Laterality: Left;  . WRIST SURGERY Bilateral    carpal tunnel   Social History   Occupational History  . 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 Smoking status: Never Smoker  . Smokeless tobacco: Never Used  Substance and Sexual Activity  . Alcohol use: No  . Drug use: No  . Sexual activity: Not on file

## 2020-01-28 ENCOUNTER — Other Ambulatory Visit: Payer: Self-pay

## 2020-01-28 ENCOUNTER — Ambulatory Visit (INDEPENDENT_AMBULATORY_CARE_PROVIDER_SITE_OTHER): Payer: Self-pay | Admitting: Physician Assistant

## 2020-01-28 ENCOUNTER — Ambulatory Visit (INDEPENDENT_AMBULATORY_CARE_PROVIDER_SITE_OTHER): Payer: Self-pay

## 2020-01-28 ENCOUNTER — Encounter: Payer: Self-pay | Admitting: Orthopaedic Surgery

## 2020-01-28 DIAGNOSIS — M1812 Unilateral primary osteoarthritis of first carpometacarpal joint, left hand: Secondary | ICD-10-CM

## 2020-01-28 MED ORDER — HYDROCODONE-ACETAMINOPHEN 5-325 MG PO TABS: 1.0000 | ORAL_TABLET | Freq: Two times a day (BID) | ORAL | 0 refills | Status: DC | PRN

## 2020-01-28 NOTE — Progress Notes (Signed)
Post-Op Visit Note   [name]           Date of Birth: [birth year]           MRN: [id number] Visit Date: 01/28/2020 PCP: Leanna Battles, MD   Assessment & Plan:  Chief Complaint:  Chief Complaint  Patient presents with  . Left Thumb - Follow-up   Visit Diagnoses:  1. Primary osteoarthritis of first carpometacarpal joint of left hand     Plan: Patient is a pleasant 64 year old female who comes in today 4 weeks out left first [facility] arthroplasty.  She has been doing fairly well.  She has mild to moderate pain at times.  No fevers or chills.  She does complain of decreased sensation to all 5 fingers.  Examination of her left wrist/hand reveals well-healed surgical incisions without evidence of infection or cellulitis.  She does have decreased sensation to all 5 fingers.  At this point, we would ideally like to start her in hand therapy for gentle range of motion and application of thermoplastic splint.  The patient notes that she does not have any insurance and does not able to do this.  We will provide her with a thumb spica splint to wear at this time.  She will work on exercises that she still has from her right first [facility] arthroplasty.  She will follow-up with [place] in 6 weeks time for recheck.  Call with concerns or questions.  Follow-Up Instructions: Return in about 6 weeks (around 03/10/2020).   Orders:  Orders Placed This Encounter  Procedures  . XR Finger Thumb Left   Meds ordered this encounter  Medications  . HYDROcodone-acetaminophen (NORCO) 5-325 MG tablet    Sig: Take 1-2 tablets by mouth 2 (two) times daily as needed for moderate pain.    Dispense:  20 tablet    Refill:  0    Imaging: No new imaging  PMFS History: Patient Active Problem List   Diagnosis Date Noted  . Primary osteoarthritis of first carpometacarpal joint of left hand 12/24/2019  . TIA (transient ischemic attack) 05/29/2019  . Anterolisthesis 05/29/2019  . Thyroid nodule 05/29/2019    . Stroke (cerebrum) ([place]) 05/27/2019  . Right arm weakness 05/27/2019  . Type 2 diabetes mellitus without complication ([place]) 99991111  . Arthritis of carpometacarpal [facility]) joint of right thumb 04/16/2019  . Mixed conductive and sensorineural hearing loss of left ear with restricted hearing of right ear 04/26/2018  . Vertigo 04/26/2018  . Venous stasis dermatitis of left lower extremity 04/09/2018  . Achilles tendon contracture, left 04/09/2018  . Pain in left ankle and joints of left foot 04/09/2018  . Primary osteoarthritis of first carpometacarpal joint of right hand 10/09/2017  . Bilateral hand pain 10/05/2017  . Primary osteoarthritis of both first carpometacarpal joints 10/05/2017  . Trochanteric bursitis, right hip 06/29/2017  . Degenerative disc disease at L5-S1 level 05/04/2017  . History of joint replacement 05/04/2017  . Claw toe, acquired, right 04/27/2017  . Achilles tendon contracture, right 04/27/2017  . Pain in right foot 04/11/2017  . Status post left hip replacement 10/21/2016  . Chronic back pain 01/14/2016  . Migraine 01/14/2016  . Essential hypertension 01/22/2015   Past Medical History:  Diagnosis Date  . Arthritis   . Arthritis    back, wrists  . Cancer ([place])    skin cancer  . Carpal tunnel syndrome   . Complication of anesthesia   . DDD (degenerative disc disease)   .  Diabetes mellitus without complication ([place])    does not take meds  . Dysrhythmia 1995   irreg hr  . Full dentures   . GERD (gastroesophageal reflux disease)   . Headache   . High cholesterol   . History of bronchitis   . History of kidney stones    2015  . Hypertension   . PONV (postoperative nausea and vomiting)   . Sleep apnea    pt. does not use a CPAP or BiPAP  . Stroke ([place])    Pt. states possible mini stroke in 2014, no deficits    Family History  Problem Relation Age of Onset  . Cancer Mother   . Diabetes Mother   . Heart disease Mother   . Hyperlipidemia Mother    . Hypertension Mother   . Cancer Father   . Diabetes Father   . Heart disease Father   . Hyperlipidemia Father   . Hypertension Father   . Varicose Veins Father   . Cancer Sister   . Heart disease Sister   . Hyperlipidemia Sister   . Cancer Brother   . Diabetes Brother   . Hyperlipidemia Brother     Past Surgical History:  Procedure Laterality Date  . ABDOMINAL HYSTERECTOMY    . BACK SURGERY  2004   x2lumb-lam  . BACK SURGERY    . CARDIAC CATHETERIZATION     01/27/15 [facility]): EF 60%, normal coronaries.  . CARPAL TUNNEL RELEASE  09/20/2012   Procedure: CARPAL TUNNEL RELEASE;  Surgeon: Cammie Sickle., MD;  Location: [place];  Service: Orthopedics;  Laterality: Left;  . CARPOMETACARPEL SUSPENSION PLASTY Right 12/12/2018   Procedure: RIGHT THUMB CARPOMETACARPAL [facility]) ARTHROPLASTY;  Surgeon: Leandrew Koyanagi, MD;  Location: [place];  Service: Orthopedics;  Laterality: Right;  . CARPOMETACARPEL SUSPENSION PLASTY Left 12/30/2019   Procedure: LEFT THUMB CARPOMETACARPAL ([place]) ARTHROPLASTY;  Surgeon: Leandrew Koyanagi, MD;  Location: [place];  Service: Orthopedics;  Laterality: Left;  . CATARACT EXTRACTION, BILATERAL    . CHOLECYSTECTOMY  07/2016   done [place]  . DORSAL COMPARTMENT RELEASE  09/20/2012   Procedure: RELEASE DORSAL COMPARTMENT ([place]);  Surgeon: Cammie Sickle., MD;  Location[facility];  Service: Orthopedics;  Laterality: Left;  . EYE SURGERY     bilat cataracts  . EYE SURGERY    . HERNIA REPAIR Left 1990   left groin   . LOWER EXTREMITY ANGIOGRAM Left 06/08/2015   Procedure: Ascending Venogram  Iliac Vein ;  Surgeon: Elam Dutch, MD;  Location: [facility] OR;  Service: Vascular;  Laterality: Left;  ;  [facility] / [place] HAVE BEEN NOTIFIED/ CONFIRMED  . PARTIAL HYSTERECTOMY    . SHOULDER SURGERY    . TOTAL HIP ARTHROPLASTY Left 10/21/2016   Procedure: LEFT TOTAL HIP  ARTHROPLASTY ANTERIOR APPROACH;  Surgeon: Mcarthur Rossetti, MD;  [facility];  Service: Orthopedics;  Laterality: Left;  . WRIST SURGERY Bilateral    carpal tunnel   Social History   Occupational History  . 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 Smoking status: Never Smoker  . Smokeless tobacco: Never Used  Substance and Sexual Activity  . Alcohol use: No  . Drug use: No  . Sexual activity: Not on file

## 2020-03-10 ENCOUNTER — Encounter: Payer: Self-pay | Admitting: Orthopaedic Surgery

## 2020-03-10 ENCOUNTER — Ambulatory Visit (INDEPENDENT_AMBULATORY_CARE_PROVIDER_SITE_OTHER): Payer: Self-pay | Admitting: Orthopaedic Surgery

## 2020-03-10 ENCOUNTER — Other Ambulatory Visit: Payer: Self-pay

## 2020-03-10 VITALS — Ht 67.0 in | Wt 178.0 lb

## 2020-03-10 DIAGNOSIS — M1812 Unilateral primary osteoarthritis of first carpometacarpal joint, left hand: Secondary | ICD-10-CM

## 2020-03-10 NOTE — Progress Notes (Signed)
Post-Op Visit Note   [name]           Date of Birth: [birth year]           MRN: [id number] Visit Date: 03/10/2020 PCP: Leanna Battles, MD   Assessment & Plan:  Chief Complaint:  Chief Complaint  Patient presents with  . Left Hand - Follow-up    Left [facility] arthroplasty 12/30/2019   Visit Diagnoses:  1. Primary osteoarthritis of first carpometacarpal joint of left hand     Plan: [name] is 10 weeks status post left thumb [facility] arthroplasty.  Overall she is doing well.  She has moderate pain with gripping but overall she is very happy.  She has been doing her home exercises daily.  She wears a brace at night.  Her main complaint is some residual stiffness and decreased range of motion of the thumb.  Surgical scars are fully healed..  Grip strength is improving.  She does not have any signs of infection.  Overall she is doing well.  She will continue with home exercises.  I would like to recheck her in 3 months.  Follow-Up Instructions: Return in about 3 months (around 06/09/2020).   Orders:  No orders of the defined types were placed in this encounter.  No orders of the defined types were placed in this encounter.   Imaging: No results found.  PMFS History: Patient Active Problem List   Diagnosis Date Noted  . Primary osteoarthritis of first carpometacarpal joint of left hand 12/24/2019  . TIA (transient ischemic attack) 05/29/2019  . Anterolisthesis 05/29/2019  . Thyroid nodule 05/29/2019  . Stroke (cerebrum) ([place]) 05/27/2019  . Right arm weakness 05/27/2019  . Type 2 diabetes mellitus without complication ([place]) 99991111  . Arthritis of carpometacarpal [facility]) joint of right thumb 04/16/2019  . Mixed conductive and sensorineural hearing loss of left ear with restricted hearing of right ear 04/26/2018  . Vertigo 04/26/2018  . Venous stasis dermatitis of left lower extremity 04/09/2018  . Achilles tendon contracture, left 04/09/2018  . Pain in left ankle and  joints of left foot 04/09/2018  . Primary osteoarthritis of first carpometacarpal joint of right hand 10/09/2017  . Bilateral hand pain 10/05/2017  . Primary osteoarthritis of both first carpometacarpal joints 10/05/2017  . Trochanteric bursitis, right hip 06/29/2017  . Degenerative disc disease at L5-S1 level 05/04/2017  . History of joint replacement 05/04/2017  . Claw toe, acquired, right 04/27/2017  . Achilles tendon contracture, right 04/27/2017  . Pain in right foot 04/11/2017  . Status post left hip replacement 10/21/2016  . Chronic back pain 01/14/2016  . Migraine 01/14/2016  . Essential hypertension 01/22/2015   Past Medical History:  Diagnosis Date  . Arthritis   . Arthritis    back, wrists  . Cancer ([place])    skin cancer  . Carpal tunnel syndrome   . Complication of anesthesia   . DDD (degenerative disc disease)   . Diabetes mellitus without complication ([place])    does not take meds  . Dysrhythmia 1995   irreg hr  . Full dentures   . GERD (gastroesophageal reflux disease)   . Headache   . High cholesterol   . History of bronchitis   . History of kidney stones    2015  . Hypertension   . PONV (postoperative nausea and vomiting)   . Sleep apnea    pt. does not use a CPAP or BiPAP  . Stroke ([place])    Pt.  states possible mini stroke in 2014, no deficits    Family History  Problem Relation Age of Onset  . Cancer Mother   . Diabetes Mother   . Heart disease Mother   . Hyperlipidemia Mother   . Hypertension Mother   . Cancer Father   . Diabetes Father   . Heart disease Father   . Hyperlipidemia Father   . Hypertension Father   . Varicose Veins Father   . Cancer Sister   . Heart disease Sister   . Hyperlipidemia Sister   . Cancer Brother   . Diabetes Brother   . Hyperlipidemia Brother     Past Surgical History:  Procedure Laterality Date  . ABDOMINAL HYSTERECTOMY    . BACK SURGERY  2004   x2lumb-lam  . BACK SURGERY    . CARDIAC CATHETERIZATION      01/27/15 [facility]): EF 60%, normal coronaries.  . CARPAL TUNNEL RELEASE  09/20/2012   Procedure: CARPAL TUNNEL RELEASE;  Surgeon: Cammie Sickle., MD;  Location: [place];  Service: Orthopedics;  Laterality: Left;  . CARPOMETACARPEL SUSPENSION PLASTY Right 12/12/2018   Procedure: RIGHT THUMB CARPOMETACARPAL [facility]) ARTHROPLASTY;  Surgeon: Leandrew Koyanagi, MD;  Location: [place];  Service: Orthopedics;  Laterality: Right;  . CARPOMETACARPEL SUSPENSION PLASTY Left 12/30/2019   Procedure: LEFT THUMB CARPOMETACARPAL ([place]) ARTHROPLASTY;  Surgeon: Leandrew Koyanagi, MD;  Location: [place];  Service: Orthopedics;  Laterality: Left;  . CATARACT EXTRACTION, BILATERAL    . CHOLECYSTECTOMY  07/2016   done [place]  . DORSAL COMPARTMENT RELEASE  09/20/2012   Procedure: RELEASE DORSAL COMPARTMENT ([place]);  Surgeon: Cammie Sickle., MD;  Location: [facility];  Service: Orthopedics;  Laterality: Left;  . EYE SURGERY     bilat cataracts  . EYE SURGERY    . HERNIA REPAIR Left 1990   left groin   . LOWER EXTREMITY ANGIOGRAM Left 06/08/2015   Procedure: Ascending Venogram  Iliac Vein ;  Surgeon: Elam Dutch, MD;  [facility]: [facility] OR;  Service: Vascular;  Laterality: Left;  ;  [facility] [employer] / [place] HAVE BEEN NOTIFIED/ CONFIRMED  . PARTIAL HYSTERECTOMY    . SHOULDER SURGERY    . TOTAL HIP ARTHROPLASTY Left 10/21/2016   Procedure: LEFT TOTAL HIP ARTHROPLASTY ANTERIOR APPROACH;  Surgeon: Mcarthur Rossetti, MD;  [facility];  Service: Orthopedics;  Laterality: Left;  . WRIST SURGERY Bilateral    carpal tunnel   Social History   Occupational History  . 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 Smoking status: Never Smoker  . Smokeless tobacco: Never Used  Substance and Sexual Activity  . Alcohol use: No  . Drug use: No  . Sexual activity: Not on file

## 2020-04-09 ENCOUNTER — Telehealth: Payer: Self-pay | Admitting: Physical Medicine and Rehabilitation

## 2020-04-09 NOTE — Telephone Encounter (Signed)
Patient is requesting another left ischial bursa injection. She states that the last one (01/06/20) helped, but the pain is starting again. Ok to repeat?

## 2020-04-10 NOTE — Telephone Encounter (Signed)
ok 

## 2020-04-10 NOTE — Telephone Encounter (Signed)
Called pt and lvm #1 

## 2020-04-11 IMAGING — MR MR LUMBAR SPINE W/O CM
4 of 5 series · 18 of 48 positions shown · non-contrast
Comparison: 12/18/2015 lumbar spine MRI.

CLINICAL DATA: 62 y/o F; severe lower back pain radiating to the
right lower extremity. Numbness in the posterior left leg.

EXAM:
MRI LUMBAR SPINE WITHOUT CONTRAST
TECHNIQUE: Multiplanar, multisequence MR imaging of the lumbar spine was
performed. No intravenous contrast was administered.

[Series 6: T2 · sagittal · 4.0mm · 0.73mm/px · 6 of 18 slices shown (1 of 2)]
[im 1/18]
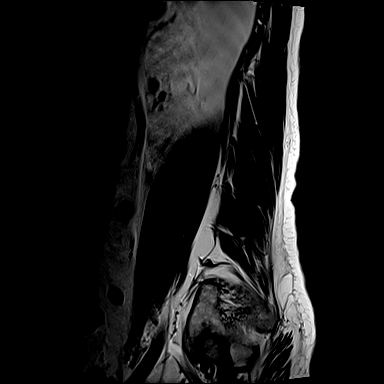
[im 4/18]
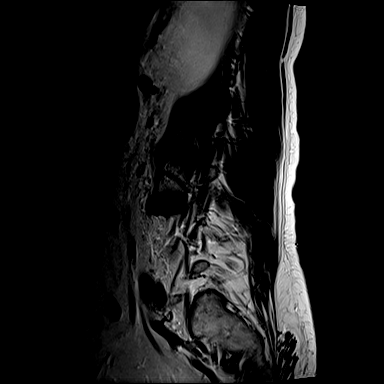
[im 7/18]
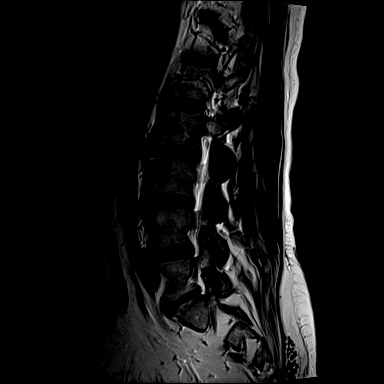
[im 11/18]
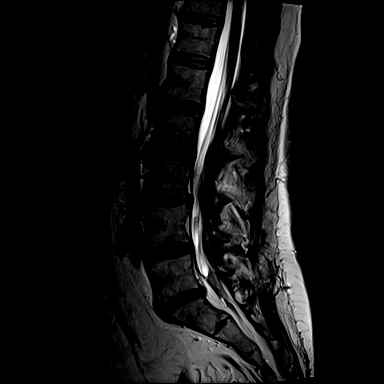
[im 14/18]
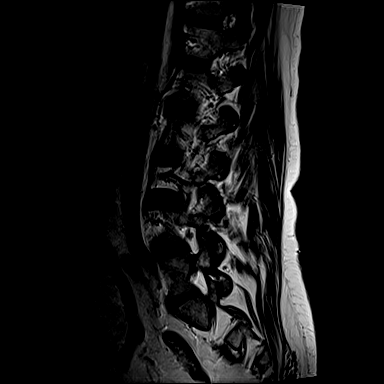
[im 18/18]
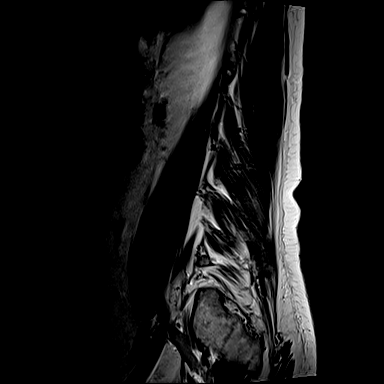

[Series 7: T1 · sagittal · 4.0mm · 0.73mm/px · 3 of 18 slices shown (1 of 2)]
[im 4/18]
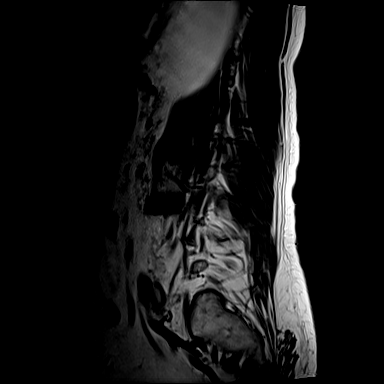
[im 11/18]
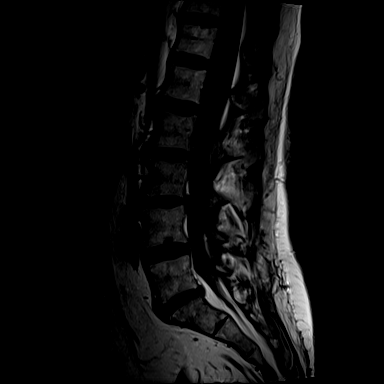
[im 18/18]
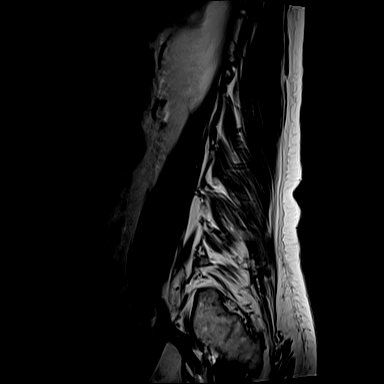

[Series 13: T2 · axial · 4.0mm · 0.28mm/px · z∈[-166,+35]mm · 6 of 43 slices shown (2 of 2)]
[im 1/43]
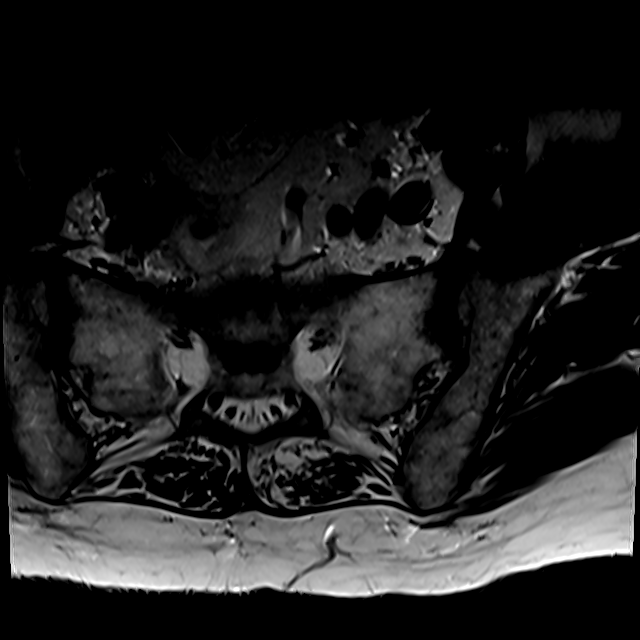
[im 7/43]
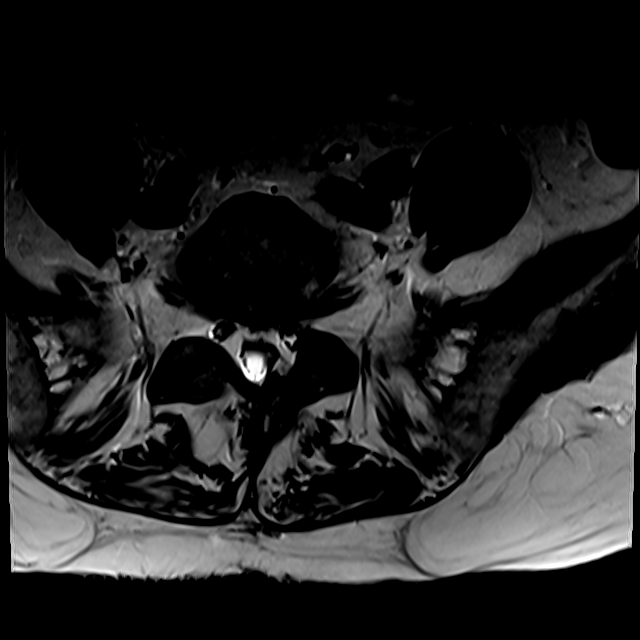
[im 13/43]
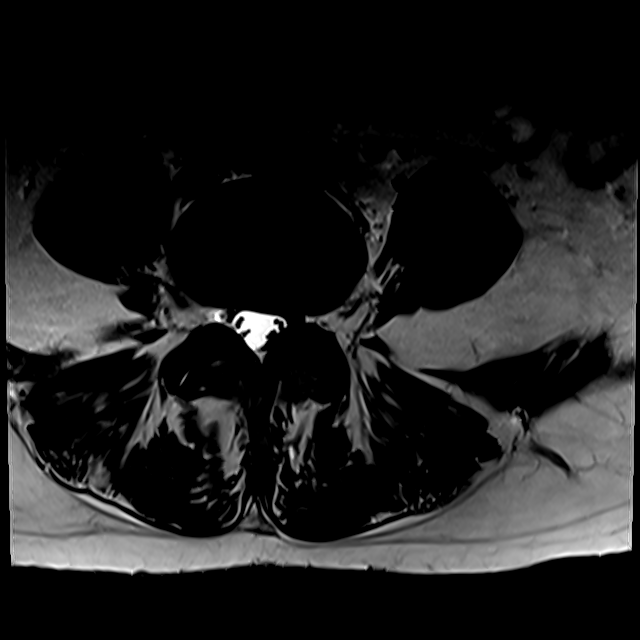
[im 19/43]
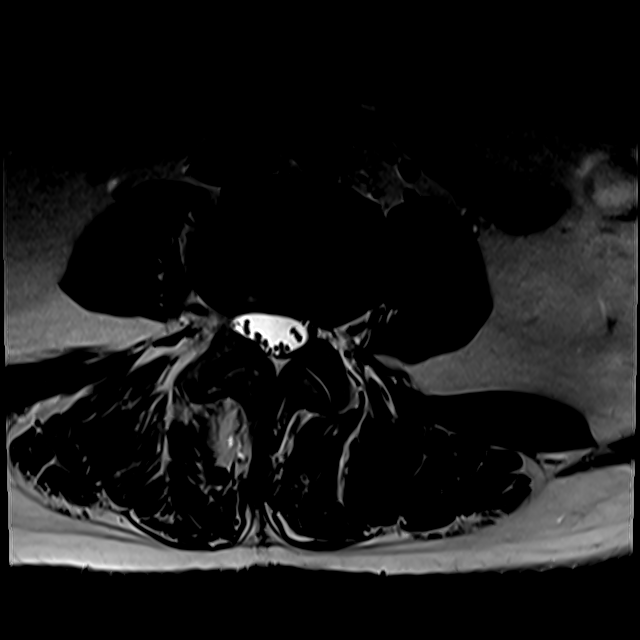
[im 22/43]
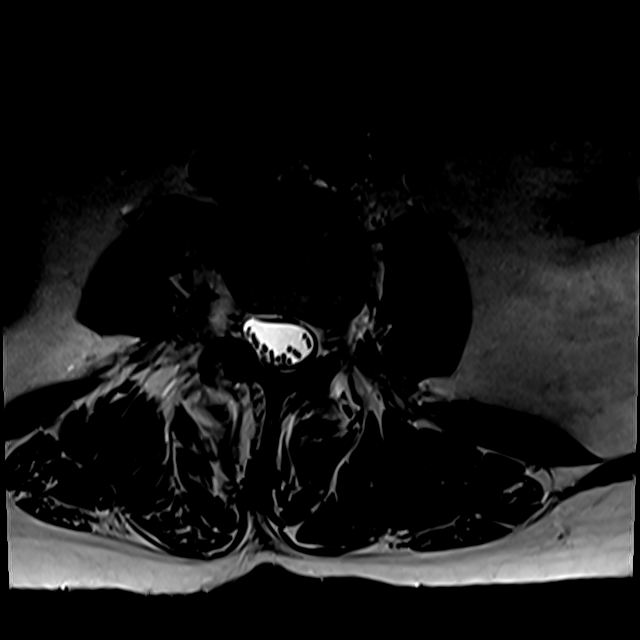
[im 37/43]
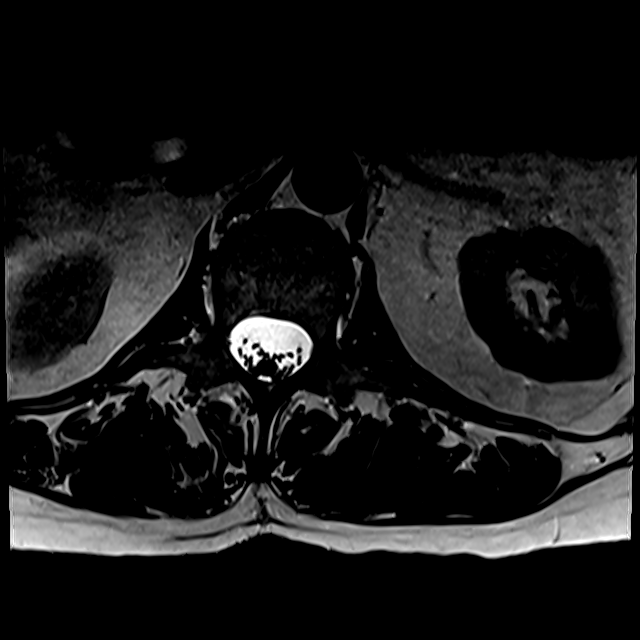

[Series 100: T1 · axial · 4.0mm · 0.28mm/px · z∈[-137,+35]mm · 3 of 43 slices shown (2 of 2)]
[im 7/43]
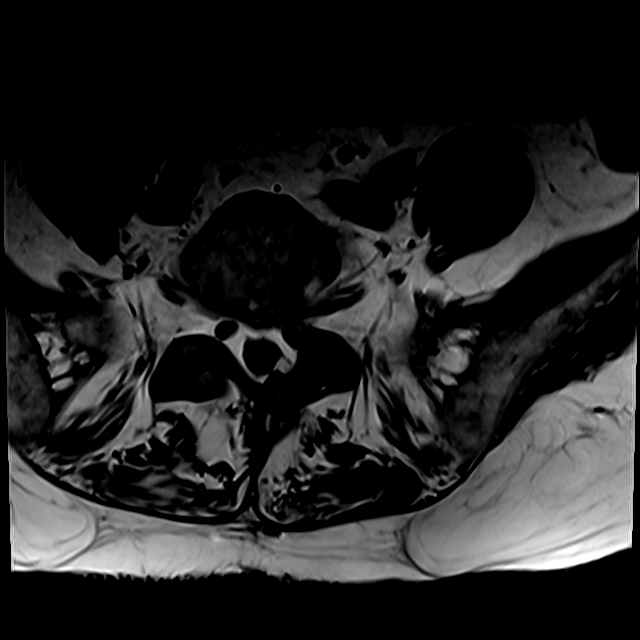
[im 22/43]
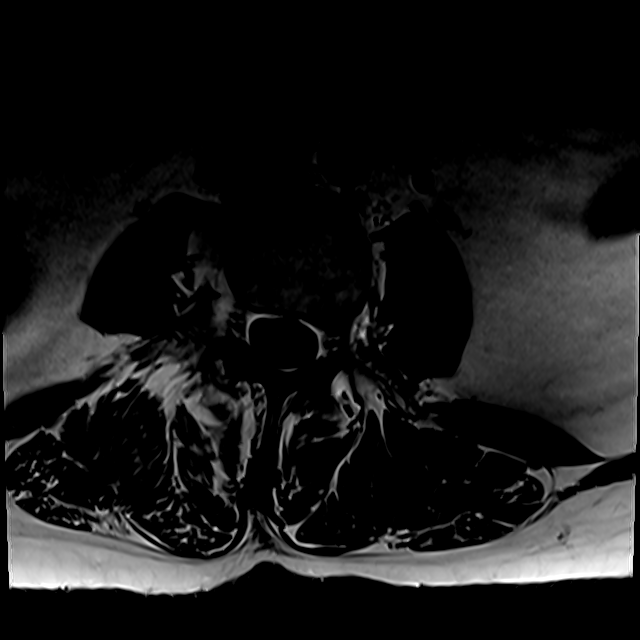
[im 37/43]
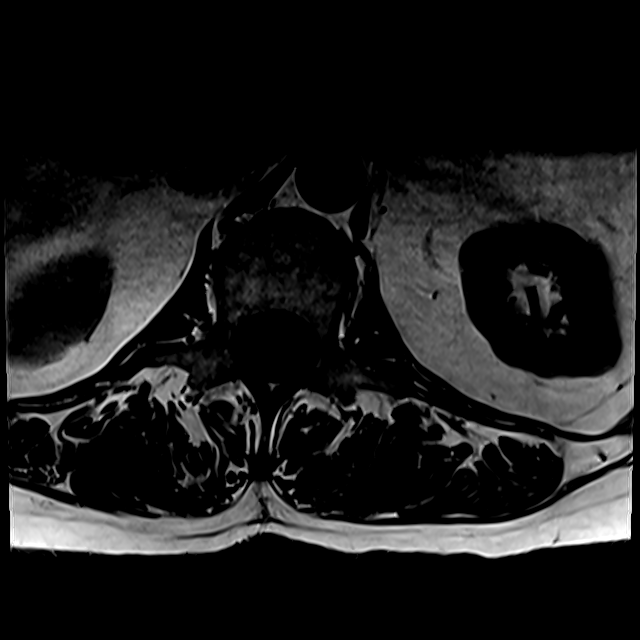

[18 of 48 positions shown; findings below may reference images not displayed]

FINDINGS: Segmentation:  Standard.

Alignment: Mild lumbar levocurvature with apex at L3. Normal lumbar
lordosis without listhesis.

Vertebrae: No fracture, evidence of discitis, or bone lesion.
Right-sided L4-5 facet edema.

Conus medullaris and cauda equina: Conus extends to the L1-2 level.
Conus and cauda equina appear normal.

Paraspinal and other soft tissues: Chronic postsurgical changes
related to left L5-S1 hemilaminectomy. No fluid collection.

Disc levels:

T12-L1: No significant disc displacement, foraminal stenosis, or
canal stenosis.

L1-2: No significant disc displacement, foraminal stenosis, or canal
stenosis.

L2-3: Stable small left foraminal disc protrusion. No significant
foraminal or canal stenosis.

L3-4: Stable disc bulge and facet hypertrophy. Interval resorption
of right juxta-articular cyst. Mild bilateral recess stenosis. No
foraminal or canal stenosis.

L4-5: Stable disc bulge, facet hypertrophy, and ligamentum flavum
hypertrophy, greater on the left. Mild left lateral recess stenosis.
No foraminal or canal stenosis.

L5-S1: Left hemilaminectomy. Stable left central disc protrusion
with mild left lateral recess stenosis. No significant foraminal or
canal stenosis.
IMPRESSION: 1. No acute fracture.  Mild levocurvature of the lumbar spine.
2. Right L4-5 facet edema, likely degenerative.
3. Stable lumbar spondylosis. No significant foraminal or canal
stenosis.

## 2020-04-28 NOTE — Telephone Encounter (Signed)
Called pt and she is scheduled for 05/19/20.

## 2020-05-19 ENCOUNTER — Other Ambulatory Visit: Payer: Self-pay

## 2020-05-19 ENCOUNTER — Encounter: Payer: Self-pay | Admitting: Physical Medicine and Rehabilitation

## 2020-05-19 ENCOUNTER — Ambulatory Visit: Payer: Self-pay

## 2020-05-19 ENCOUNTER — Ambulatory Visit (INDEPENDENT_AMBULATORY_CARE_PROVIDER_SITE_OTHER): Payer: Self-pay | Admitting: Physical Medicine and Rehabilitation

## 2020-05-19 DIAGNOSIS — M7072 Other bursitis of hip, left hip: Secondary | ICD-10-CM

## 2020-05-19 NOTE — Progress Notes (Signed)
Pt states pain in the left hip/bursa (no groin pain) and right hip/bursa (no groin pain). Pt states last injection 12/2019 helped out a lot and pain returned a month ago. Sitting makes pain worse. Pt states nothing helps with pain.   .Numeric Pain Rating Scale and Functional Assessment Average Pain 9   In the last MONTH (on 0-10 scale) has pain interfered with the following?  1. General activity like being  able to carry out your everyday physical activities such as walking, climbing stairs, carrying groceries, or moving a chair?  Rating(10)    -Dye Allergies.

## 2020-05-20 MED ORDER — TRIAMCINOLONE ACETONIDE 40 MG/ML IJ SUSP
60.0000 mg | INTRAMUSCULAR | Status: AC | PRN
  Administered 2020-05-19: 60 mg via INTRA_ARTICULAR

## 2020-05-20 MED ORDER — BUPIVACAINE HCL 0.25 % IJ SOLN
4.0000 mL | INTRAMUSCULAR | Status: AC | PRN
  Administered 2020-05-19: 4 mL via INTRA_ARTICULAR

## 2020-05-20 NOTE — Progress Notes (Signed)
Natalie Bond - 64 y.o. female MRN [id number]  Date of birth: 1956-02-10  Office Visit Note: Visit Date: 05/19/2020 PCP: Leanna Battles, MD Referred by: Leanna Battles, MD  Subjective: Chief Complaint  Patient presents with  . Right Hip - Pain  . Left Hip - Pain   HPI:  Natalie Bond is a 64 y.o. female who comes in today at the request of Dr. Jean Rosenthal for planned repeat left ischial bursa injection with fluoroscopic guidance.  The patient has failed conservative care including home exercise, medications, time and activity modification.  This injection will be diagnostic and hopefully therapeutic.  Please see requesting physician notes for further details and justification. Prior injection in February was very helpful. She started having symptoms about a month ago. She reports left ischial pain without groin pain. She reports no radicular symptoms. She has a long chronic pain and chronic musculoskeletal history. She is followed by Dr. Eduard Roux as well as Dr. Jean Rosenthal. She has had issues with right greater trochanteric bursitis and she can follow-up with Dr. Ninfa Linden for injection in the office for that.   ROS Otherwise per HPI.  Assessment & Plan: Visit Diagnoses:  1. Ischial bursitis of left side     Plan: No additional findings.   Meds & Orders: No orders of the defined types were placed in this encounter.   Orders Placed This Encounter  Procedures  . Large Joint Inj  . XR C-ARM NO REPORT    Follow-up: No follow-ups on file.   Procedures: Large Joint Inj (Left ischial bursa) on 05/19/2020 3:00 PM Indications: diagnostic evaluation and pain Details: 22 G Needle length (in): Findings. needle, fluoroscopy-guided posterior approach  Arthrogram: No  Medications: 4 mL bupivacaine 0.25 %; 60 mg triamcinolone acetonide 40 MG/ML Outcome: tolerated well, no immediate complications  There was excellent flow of contrast producing a partial bursa gram  of the left ischial bursa Procedure, treatment alternatives, risks and benefits explained, specific risks discussed. Consent was given by the patient. Immediately prior to procedure a time out was called to verify the correct patient, procedure, equipment, support staff and site/side marked as required. Patient was prepped and draped in the usual sterile fashion.      No notes on file   Clinical History: HISTORY: Bilateral hip pain  TECHNIQUE: 2 view bilateral hip study  FINDINGS: Right hip shows no acute osseous abnormalities. Dystrophic calcifications are seen in the soft tissues adjacent to the greater trochanter most likely age related but could represent old injury. No articular irregularities. Unremarkable SI  joints.  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 No evidence of loosening or periarticular fractures. Alignment unremarkable. Unremarkable left SI joint Other Result Information Acute Interface, Incoming Rad Results - 11/11/2019  4:06 PM EST HISTORY: Bilateral hip pain  TECHNIQUE: 2 view bilateral hip study  FINDINGS: Right hip shows no acute osseous abnormalities. Dystrophic calcifications are seen in the soft tissues adjacent to the greater trochanter most likely age related but could represent old injury. No articular irregularities. Unremarkable SI  joints.  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 No evidence of loosening or periarticular fractures. Alignment unremarkable. Unremarkable left SI joint   IMPRESSION: No acute osseous or soft tissue findings.  Electronically Signed by: Satira Sark   ----- MRI LUMBAR SPINE WITHOUT CONTRAST    TECHNIQUE:  Multiplanar, multisequence MR imaging of the lumbar spine was  performed. No intravenous contrast was administered.    COMPARISON: 12/18/2015 lumbar spine  MRI.    FINDINGS:  Segmentation: Standard.    Alignment: Mild lumbar levocurvature with apex at L3. Normal lumbar  lordosis without  listhesis.    Vertebrae: No fracture, evidence of discitis, or bone lesion.  Right-sided L4-5 facet edema.    Conus medullaris and cauda equina: Conus extends to the L1-2 level.  Conus and cauda equina appear normal.    Paraspinal and other soft tissues: Chronic postsurgical changes  related to left L5-S1 hemilaminectomy. No fluid collection.    Disc levels:    T12-L1: No significant disc displacement, foraminal stenosis, or  canal stenosis.    L1-2: No significant disc displacement, foraminal stenosis, or canal  stenosis.    L2-3: Stable small left foraminal disc protrusion. No significant  foraminal or canal stenosis.    L3-4: Stable disc bulge and facet hypertrophy. Interval resorption  of right juxta-articular cyst. Mild bilateral recess stenosis. No  foraminal or canal stenosis.    L4-5: Stable disc bulge, facet hypertrophy, and ligamentum flavum  hypertrophy, greater on the left. Mild left lateral recess stenosis.  No foraminal or canal stenosis.    L5-S1: Left hemilaminectomy. Stable left central disc protrusion  with mild left lateral recess stenosis. No significant foraminal or  canal stenosis.    IMPRESSION:  1. No acute fracture. Mild levocurvature of the lumbar spine.  2. Right L4-5 facet edema, likely degenerative.  3. Stable lumbar spondylosis. No significant foraminal or canal  stenosis.      Electronically Signed  By: Kristine Garbe M.D.  On: 12/01/2018 14:02     Objective:  VS:  HT:    WT:   BMI:     BP:   HR: bpm  TEMP: ( )  RESP:  Physical Exam Constitutional:      General: She is not in acute distress.    Appearance: Normal appearance. She is not ill-appearing.  HENT:     Head: Normocephalic and atraumatic.     Right Ear: External ear normal.     Left Ear: External ear normal.  Eyes:     Extraocular Movements: Extraocular movements intact.  Cardiovascular:     Rate and Rhythm: Normal rate.     Pulses:  Normal pulses.  Musculoskeletal:     Right lower leg: No edema.     Left lower leg: No edema.     Comments: Patient has good distal strength with no pain over the greater trochanters.  No clonus or focal weakness. Concordant left hip pain over the ischial bursa. Concordant right hip pain over the greater trochanter.  Skin:    Findings: No erythema, lesion or rash.  Neurological:     General: No focal deficit present.     Mental Status: She is alert and oriented to person, place, and time.     Sensory: No sensory deficit.     Motor: No weakness or abnormal muscle tone.     Coordination: Coordination normal.  Psychiatric:        Mood and Affect: Mood normal.        Behavior: Behavior normal.      Imaging: XR C-ARM NO REPORT  Result Date: 05/19/2020 Please see Notes tab for imaging impression.

## 2020-06-09 ENCOUNTER — Ambulatory Visit: Payer: Medicaid Other | Admitting: Orthopaedic Surgery

## 2020-09-22 ENCOUNTER — Telehealth: Payer: Self-pay | Admitting: Physical Medicine and Rehabilitation

## 2020-09-22 NOTE — Telephone Encounter (Signed)
Pt called stating she would like to get scheduled for a inj  [phone]

## 2020-09-23 ENCOUNTER — Other Ambulatory Visit: Payer: Self-pay | Admitting: Internal Medicine

## 2020-09-23 DIAGNOSIS — K219 Gastro-esophageal reflux disease without esophagitis: Secondary | ICD-10-CM

## 2020-09-23 DIAGNOSIS — R1319 Other dysphagia: Secondary | ICD-10-CM

## 2020-09-23 NOTE — Telephone Encounter (Signed)
Left message #1

## 2020-09-23 NOTE — Telephone Encounter (Signed)
Scheduled for 11/9 at 1315.

## 2020-09-23 NOTE — Telephone Encounter (Signed)
Patient had a left ischial bursa injection on 6/22. Ok to repeat if helped, same problem/side, and no new injury?

## 2020-09-23 NOTE — Telephone Encounter (Signed)
Ok

## 2020-10-05 ENCOUNTER — Ambulatory Visit
Admission: RE | Admit: 2020-10-05 | Discharge: 2020-10-05 | Disposition: A | Discharge status: Home / Self Care | Payer: Medicaid Other | Source: Ambulatory Visit | Attending: Internal Medicine | Admitting: Internal Medicine

## 2020-10-05 DIAGNOSIS — R1319 Other dysphagia: Secondary | ICD-10-CM

## 2020-10-05 DIAGNOSIS — K219 Gastro-esophageal reflux disease without esophagitis: Secondary | ICD-10-CM

## 2020-10-06 ENCOUNTER — Encounter: Payer: Self-pay | Admitting: Physical Medicine and Rehabilitation

## 2020-10-06 ENCOUNTER — Ambulatory Visit: Payer: Self-pay | Admitting: Physical Medicine and Rehabilitation

## 2020-10-06 ENCOUNTER — Other Ambulatory Visit: Payer: Self-pay

## 2020-10-06 ENCOUNTER — Ambulatory Visit: Payer: Self-pay

## 2020-10-06 DIAGNOSIS — M7072 Other bursitis of hip, left hip: Secondary | ICD-10-CM

## 2020-10-06 NOTE — Progress Notes (Signed)
Natalie Bond - 64 y.o. female MRN [id number]  Date of birth: [birth year]  Office Visit Note: Visit Date: 10/06/2020 PCP: Leanna Battles, MD Referred by: Leanna Battles, MD  Subjective: Chief Complaint  Patient presents with  . Left Hip - Pain   HPI:  Natalie Bond is a 64 y.o. female who comes in today For repeat left ischial bursa injection.  Prior injection in January gave her quite a bit of relief up until just the last couple of months and the symptoms worsen.  She does have a prior left total hip arthroplasty I believe by Dr. Ninfa Linden performed in 2017.  She also follows with Dr. Eduard Roux.  She reports no new symptoms or trauma.  She does endorse some catching sensation of the hip area in the buttock area with movement.  I will ask her to follow-up with her orthopedic surgeons in that regard.  She may wish to seek care with physical therapy to look at may be strengthening of the neck hamstring proximately or if there is anything they can do from an external standpoint.  I also briefly discussed that she may want to look at Dr. Junius Roads in our office performing PRP injections around that tendon insertion.  ROS Otherwise per HPI.  Assessment & Plan: Visit Diagnoses:  1. Ischial bursitis of left side     Plan: No additional findings.   Meds & Orders: No orders of the defined types were placed in this encounter.   Orders Placed This Encounter  Procedures  . Large Joint Inj  . XR C-ARM NO REPORT    Follow-up: Return if symptoms worsen or fail to improve.   Procedures: Large Joint Inj (Left Ischium) on 10/06/2020 1:16 PM Indications: diagnostic evaluation and pain Details: 22 G 3.5 in needle, fluoroscopy-guided anterior approach  Arthrogram: No  Medications: 4 mL bupivacaine 0.25 %; 60 mg triamcinolone acetonide 40 MG/ML Outcome: tolerated well, no immediate complications  There was excellent flow of contrast producing a partial arthrogram of the hip. The patient did have  relief of symptoms during the anesthetic phase of the injection. Procedure, treatment alternatives, risks and benefits explained, specific risks discussed. Consent was given by the patient. Immediately prior to procedure a time out was called to verify the correct patient, procedure, equipment, support staff and site/side marked as required. Patient was prepped and draped in the usual sterile fashion.      No notes on file   Clinical History: HISTORY: Bilateral hip pain  TECHNIQUE: 2 view bilateral hip study  FINDINGS: Right hip shows no acute osseous abnormalities. Dystrophic calcifications are seen in the soft tissues adjacent to the greater trochanter most likely age related but could represent old injury. No articular irregularities. Unremarkable SI  joints.  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 No evidence of loosening or periarticular fractures. Alignment unremarkable. Unremarkable left SI joint Other Result Information Acute Interface, Incoming Rad Results - 11/11/2019  4:06 PM EST HISTORY: Bilateral hip pain  TECHNIQUE: 2 view bilateral hip study  FINDINGS: Right hip shows no acute osseous abnormalities. Dystrophic calcifications are seen in the soft tissues adjacent to the greater trochanter most likely age related but could represent old injury. No articular irregularities. Unremarkable SI  joints.  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 No evidence of loosening or periarticular fractures. Alignment unremarkable. Unremarkable left SI joint   IMPRESSION: No acute osseous or soft tissue findings.  Electronically Signed by: Satira Sark   ----- MRI LUMBAR  SPINE WITHOUT CONTRAST    TECHNIQUE:  Multiplanar, multisequence MR imaging of the lumbar spine was  performed. No intravenous contrast was administered.    COMPARISON: 12/18/2015 lumbar spine MRI.    FINDINGS:  Segmentation: Standard.    Alignment: Mild lumbar levocurvature with apex at L3.  Normal lumbar  lordosis without listhesis.    Vertebrae: No fracture, evidence of discitis, or bone lesion.  Right-sided L4-5 facet edema.    Conus medullaris and cauda equina: Conus extends to the L1-2 level.  Conus and cauda equina appear normal.    Paraspinal and other soft tissues: Chronic postsurgical changes  related to left L5-S1 hemilaminectomy. No fluid collection.    Disc levels:    T12-L1: No significant disc displacement, foraminal stenosis, or  canal stenosis.    L1-2: No significant disc displacement, foraminal stenosis, or canal  stenosis.    L2-3: Stable small left foraminal disc protrusion. No significant  foraminal or canal stenosis.    L3-4: Stable disc bulge and facet hypertrophy. Interval resorption  of right juxta-articular cyst. Mild bilateral recess stenosis. No  foraminal or canal stenosis.    L4-5: Stable disc bulge, facet hypertrophy, and ligamentum flavum  hypertrophy, greater on the left. Mild left lateral recess stenosis.  No foraminal or canal stenosis.    L5-S1: Left hemilaminectomy. Stable left central disc protrusion  with mild left lateral recess stenosis. No significant foraminal or  canal stenosis.    IMPRESSION:  1. No acute fracture. Mild levocurvature of the lumbar spine.  2. Right L4-5 facet edema, likely degenerative.  3. Stable lumbar spondylosis. No significant foraminal or canal  stenosis.      Electronically Signed  By: Kristine Garbe M.D.  On: 12/01/2018 14:02     Objective:  VS:  HT:    WT:   BMI:     BP:   HR: bpm  TEMP: ( )  RESP:  Physical Exam   Imaging: XR C-ARM NO REPORT  Result Date: 10/06/2020 Please see Notes tab for imaging impression.

## 2020-10-06 NOTE — Progress Notes (Signed)
Pt state left hip and buttocks pain. Pt state sitting for a long period of time makes the pain worse. Pt state she try to sit on pillow, take pain meds and use heating pads to help ease the pain. Pt has hx of inj on 05/19/20 Pt state that the inj was great.  Numeric Pain Rating Scale and Functional Assessment Average Pain 10   In the last MONTH (on 0-10 scale) has pain interfered with the following?  1. General activity like being  able to carry out your everyday physical activities such as walking, climbing stairs, carrying groceries, or moving a chair?  Rating(10)

## 2020-10-07 MED ORDER — BUPIVACAINE HCL 0.25 % IJ SOLN
4.0000 mL | INTRAMUSCULAR | Status: AC | PRN
  Administered 2020-10-06: 4 mL via INTRA_ARTICULAR

## 2020-10-07 MED ORDER — TRIAMCINOLONE ACETONIDE 40 MG/ML IJ SUSP
60.0000 mg | INTRAMUSCULAR | Status: AC | PRN
  Administered 2020-10-06: 60 mg via INTRA_ARTICULAR

## 2021-01-07 ENCOUNTER — Other Ambulatory Visit: Payer: Self-pay | Admitting: Orthopaedic Surgery

## 2021-01-26 ENCOUNTER — Emergency Department (HOSPITAL_COMMUNITY)
Admission: EM | Admit: 2021-01-26 | Discharge: 2021-01-26 | Discharge status: Against Medical Advice | Payer: Medicaid Other

## 2021-06-19 ENCOUNTER — Other Ambulatory Visit: Payer: Self-pay | Admitting: Physician Assistant

## 2021-06-24 DIAGNOSIS — E041 Nontoxic single thyroid nodule: Secondary | ICD-10-CM | POA: Diagnosis not present

## 2021-06-24 DIAGNOSIS — E0590 Thyrotoxicosis, unspecified without thyrotoxic crisis or storm: Secondary | ICD-10-CM | POA: Diagnosis not present

## 2021-06-24 DIAGNOSIS — F5104 Psychophysiologic insomnia: Secondary | ICD-10-CM | POA: Diagnosis not present

## 2021-06-24 DIAGNOSIS — G4733 Obstructive sleep apnea (adult) (pediatric): Secondary | ICD-10-CM | POA: Diagnosis not present

## 2021-06-24 DIAGNOSIS — E1140 Type 2 diabetes mellitus with diabetic neuropathy, unspecified: Secondary | ICD-10-CM | POA: Diagnosis not present

## 2021-06-24 DIAGNOSIS — I10 Essential (primary) hypertension: Secondary | ICD-10-CM | POA: Diagnosis not present

## 2021-07-08 DIAGNOSIS — L602 Onychogryphosis: Secondary | ICD-10-CM | POA: Diagnosis not present

## 2021-07-08 DIAGNOSIS — L308 Other specified dermatitis: Secondary | ICD-10-CM | POA: Diagnosis not present

## 2021-07-08 DIAGNOSIS — L603 Nail dystrophy: Secondary | ICD-10-CM | POA: Diagnosis not present

## 2021-07-15 DIAGNOSIS — M542 Cervicalgia: Secondary | ICD-10-CM | POA: Diagnosis not present

## 2021-07-15 DIAGNOSIS — R152 Fecal urgency: Secondary | ICD-10-CM | POA: Diagnosis not present

## 2021-07-15 DIAGNOSIS — I10 Essential (primary) hypertension: Secondary | ICD-10-CM | POA: Diagnosis not present

## 2021-07-15 DIAGNOSIS — E0590 Thyrotoxicosis, unspecified without thyrotoxic crisis or storm: Secondary | ICD-10-CM | POA: Diagnosis not present

## 2021-07-15 DIAGNOSIS — M62838 Other muscle spasm: Secondary | ICD-10-CM | POA: Diagnosis not present

## 2021-07-27 ENCOUNTER — Telehealth: Payer: Self-pay | Admitting: Physical Medicine and Rehabilitation

## 2021-07-27 NOTE — Telephone Encounter (Signed)
Pt called requesting an appt for back injections. Please call pt at [phone].

## 2021-07-28 NOTE — Telephone Encounter (Signed)
Patient had left ischial bursa injection on 10/06/20 and left L4-5 facet on 05/03/2019. She reports that she is having low back pain which is on the right as well as the left and pain radiating down her leg to her foot. Scheduled for OV on 9/2.

## 2021-07-30 ENCOUNTER — Ambulatory Visit: Payer: Medicaid Other | Admitting: Physical Medicine and Rehabilitation

## 2021-07-30 ENCOUNTER — Telehealth: Payer: Self-pay | Admitting: Physical Medicine and Rehabilitation

## 2021-07-30 NOTE — Telephone Encounter (Signed)
Pt called and to cancel appt. Pt does not need to reschedule at this time. No call back needed.

## 2021-09-02 DIAGNOSIS — Z23 Encounter for immunization: Secondary | ICD-10-CM | POA: Diagnosis not present

## 2021-09-02 DIAGNOSIS — G4733 Obstructive sleep apnea (adult) (pediatric): Secondary | ICD-10-CM | POA: Diagnosis not present

## 2021-09-02 DIAGNOSIS — E785 Hyperlipidemia, unspecified: Secondary | ICD-10-CM | POA: Diagnosis not present

## 2021-09-02 DIAGNOSIS — G8929 Other chronic pain: Secondary | ICD-10-CM | POA: Diagnosis not present

## 2021-09-02 DIAGNOSIS — I10 Essential (primary) hypertension: Secondary | ICD-10-CM | POA: Diagnosis not present

## 2021-09-02 DIAGNOSIS — E1140 Type 2 diabetes mellitus with diabetic neuropathy, unspecified: Secondary | ICD-10-CM | POA: Diagnosis not present

## 2021-09-02 DIAGNOSIS — M545 Low back pain, unspecified: Secondary | ICD-10-CM | POA: Diagnosis not present

## 2021-09-02 DIAGNOSIS — E0590 Thyrotoxicosis, unspecified without thyrotoxic crisis or storm: Secondary | ICD-10-CM | POA: Diagnosis not present

## 2021-09-07 DIAGNOSIS — E0590 Thyrotoxicosis, unspecified without thyrotoxic crisis or storm: Secondary | ICD-10-CM | POA: Diagnosis not present

## 2021-09-07 DIAGNOSIS — I10 Essential (primary) hypertension: Secondary | ICD-10-CM | POA: Diagnosis not present

## 2021-09-07 DIAGNOSIS — E1140 Type 2 diabetes mellitus with diabetic neuropathy, unspecified: Secondary | ICD-10-CM | POA: Diagnosis not present

## 2021-11-18 ENCOUNTER — Institutional Professional Consult (permissible substitution): Payer: PPO | Admitting: Neurology

## 2021-12-07 ENCOUNTER — Telehealth: Payer: Self-pay | Admitting: Physical Medicine and Rehabilitation

## 2021-12-07 NOTE — Telephone Encounter (Signed)
Pt called requesting a call back to set an appt. Please call pt at [phone].

## 2021-12-09 ENCOUNTER — Encounter: Payer: Self-pay | Admitting: Physical Medicine and Rehabilitation

## 2021-12-09 ENCOUNTER — Ambulatory Visit: Payer: PPO | Admitting: Physical Medicine and Rehabilitation

## 2021-12-09 ENCOUNTER — Other Ambulatory Visit: Payer: Self-pay

## 2021-12-09 VITALS — BP 169/102 | HR 52

## 2021-12-09 DIAGNOSIS — M5416 Radiculopathy, lumbar region: Secondary | ICD-10-CM

## 2021-12-09 DIAGNOSIS — M5442 Lumbago with sciatica, left side: Secondary | ICD-10-CM | POA: Diagnosis not present

## 2021-12-09 DIAGNOSIS — M961 Postlaminectomy syndrome, not elsewhere classified: Secondary | ICD-10-CM

## 2021-12-09 DIAGNOSIS — G8929 Other chronic pain: Secondary | ICD-10-CM

## 2021-12-09 DIAGNOSIS — M5116 Intervertebral disc disorders with radiculopathy, lumbar region: Secondary | ICD-10-CM

## 2021-12-09 NOTE — Progress Notes (Signed)
Pt lower back pain that travels posterior of her left leg. Pt state she feel pain in her buttocks. Pt state she been doing excise to help ease her pain.  Pt state walking and sitting makes the pain worse. Pt state she takes pain meds to help ease her pain.  Numeric Pain Rating Scale and Functional Assessment Average Pain 10 Pain Right Now 8 My pain is constant, burning, stabbing, tingling, and aching Pain is worse with: walking and sitting Pain improves with: therapy/exercise, medication, and injections   In the last MONTH (on 0-10 scale) has pain interfered with the following?  1. General activity like being  able to carry out your everyday physical activities such as walking, climbing stairs, carrying groceries, or moving a chair?  Rating(7)  2. Relation with others like being able to carry out your usual social activities and roles such as  activities at home, at work and in your community. Rating(8)  3. Enjoyment of life such that you have  been bothered by emotional problems such as feeling anxious, depressed or irritable?  Rating(9)

## 2021-12-09 NOTE — Progress Notes (Signed)
Natalie Bond - 66 y.o. female MRN [id number]  Date of birth: 01-08-1956  Office Visit Note: Visit Date: 12/09/2021 PCP: Donnajean Lopes, MD Referred by: Donnajean Lopes, MD  Subjective: Chief Complaint  Patient presents with   Lower Back - Pain   Left Leg - Pain   HPI: Natalie Bond is a 66 y.o. female who comes in today for evaluation of chronic, worsening and severe left sided lower back pain radiating to buttock, posterior leg and down to foot. Patient reports ongoing issues with chronic back pain for several years. Patient reports pain is exacerbated by movement and activity, describes as a sharp and sore sensation, currently rates as 8 out of 10. Patient reports some relief of pain with home exercise regimen, rest and use of medications. Patient also reports some relief of pain with water aerobics at the [facility] Patient currently taking Norco as needed for moderate/severe pain that is prescribed by Dr. Leanna Battles. Patient has a history of 2 laminectomy discectomies by Dr. Matt Holmes in 2004. Patients lumbar MRI from 2020 right L4-L5 facet edema, left hemilaminectomy at L5-S1, there is also left central disc protrusion with mild left lateral recess stenosis at L5-S1. No high grade spinal canal stenosis noted. Patient has history of facet joint injections, epidural steroid injection and ischial bursa injections performed by Dr. Magnus Sinning over the years and does reports some relief of pain with these procedures. Patient has a history of multiple falls and orthopedic injuries, she has been treated by several of the orthopedic surgeons in our practice. Patient denies focal weakness, numbness and tingling. Patient denies bowel/bladder incontinence.   Review of Systems  Musculoskeletal:  Positive for back pain.  Neurological:  Negative for tingling, sensory change, focal weakness and weakness.  All other systems reviewed and are negative. Otherwise per HPI.  Assessment &  Plan: Visit Diagnoses:    ICD-10-CM   1. Lumbar radiculopathy  M54.16 Ambulatory referral to Physical Medicine Rehab    2. Radiculopathy due to lumbar intervertebral disc disorder  M51.16     3. Chronic left-sided low back pain with left-sided sciatica  M54.42    G89.29     4. Post laminectomy syndrome  M96.1        Plan: Findings:  Chronic, worsening and severe left sided lower back pain radiating to buttock, posterior leg and down to foot. Patient continues to have excruciating and debilitating pain despite good conservative therapies such as home exercise regimen, rest and use of medications. Patients clinical presentation and exam are consistent with L5/S1 nerve pattern. Patient has had good pain relief with transforaminal epidural steroid injections in the past. We believe the next step is to perform a diagnostic and hopefully therapeutic left S1 transforaminal epidural steroid injection under fluoroscopic guidance. Patient encouraged to remain active and to continue home exercise regimen and water aerobics as tolerated. No red flag symptoms noted upon exam today.    Meds & Orders: No orders of the defined types were placed in this encounter.   Orders Placed This Encounter  Procedures   Ambulatory referral to Physical Medicine Rehab    Follow-up: Return for Left S1 transforaminal epidural steroid injection.   Procedures: No procedures performed      Clinical History: HISTORY: Bilateral hip pain  TECHNIQUE: 2 view bilateral hip study  FINDINGS: Right hip shows no acute osseous abnormalities. Dystrophic calcifications are seen in the soft tissues adjacent to the greater trochanter most likely age related but  could represent old injury. No articular irregularities. Unremarkable SI  joints.  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 No evidence of loosening or periarticular fractures. Alignment unremarkable. Unremarkable left SI joint Other Result Information Acute  Interface, Incoming Rad Results - 11/11/2019  4:06 PM EST HISTORY: Bilateral hip pain  TECHNIQUE: 2 view bilateral hip study  FINDINGS: Right hip shows no acute osseous abnormalities. Dystrophic calcifications are seen in the soft tissues adjacent to the greater trochanter most likely age related but could represent old injury. No articular irregularities. Unremarkable SI  joints.  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 No evidence of loosening or periarticular fractures. Alignment unremarkable. Unremarkable left SI joint   IMPRESSION: No acute osseous or soft tissue findings.  Electronically Signed by: Satira Sark   ----- MRI LUMBAR SPINE WITHOUT CONTRAST     TECHNIQUE:  Multiplanar, multisequence MR imaging of the lumbar spine was  performed. No intravenous contrast was administered.     COMPARISON:  12/18/2015 lumbar spine MRI.     FINDINGS:  Segmentation:  Standard.     Alignment: Mild lumbar levocurvature with apex at L3. Normal lumbar  lordosis without listhesis.     Vertebrae: No fracture, evidence of discitis, or bone lesion.  Right-sided L4-5 facet edema.     Conus medullaris and cauda equina: Conus extends to the L1-2 level.  Conus and cauda equina appear normal.     Paraspinal and other soft tissues: Chronic postsurgical changes  related to left L5-S1 hemilaminectomy. No fluid collection.     Disc levels:     T12-L1: No significant disc displacement, foraminal stenosis, or  canal stenosis.     L1-2: No significant disc displacement, foraminal stenosis, or canal  stenosis.     L2-3: Stable small left foraminal disc protrusion. No significant  foraminal or canal stenosis.     L3-4: Stable disc bulge and facet hypertrophy. Interval resorption  of right juxta-articular cyst. Mild bilateral recess stenosis. No  foraminal or canal stenosis.     L4-5: Stable disc bulge, facet hypertrophy, and ligamentum flavum  hypertrophy, greater on the left.  Mild left lateral recess stenosis.  No foraminal or canal stenosis.     L5-S1: Left hemilaminectomy. Stable left central disc protrusion  with mild left lateral recess stenosis. No significant foraminal or  canal stenosis.     IMPRESSION:  1. No acute fracture.  Mild levocurvature of the lumbar spine.  2. Right L4-5 facet edema, likely degenerative.  3. Stable lumbar spondylosis. No significant foraminal or canal  stenosis.        Electronically Signed    By: Kristine Garbe M.D.    On: 12/01/2018 14:02   She reports that she has never smoked. She has never used smokeless tobacco. No results for input(s): HGBA1C, LABURIC in the last 8760 hours.  Objective:  VS:  HT:     WT:    BMI:      BP:(!) 169/102   HR:(!) 52bpm   TEMP: ( )   RESP:  Physical Exam Vitals and nursing note reviewed.  HENT:     Head: Normocephalic and atraumatic.     Right Ear: External ear normal.     Left Ear: External ear normal.     Nose: Nose normal.     Mouth/Throat:     Mouth: Mucous membranes are moist.  Eyes:     Extraocular Movements: Extraocular movements intact.  Cardiovascular:     Rate and Rhythm: Normal rate.  Pulses: Normal pulses.  Pulmonary:     Effort: Pulmonary effort is normal.  Abdominal:     General: Abdomen is flat. There is no distension.  Musculoskeletal:        General: Tenderness present.     Cervical back: Normal range of motion.     Comments: Pt rises from seated position to standing without difficulty. Concordant low back pain with facet loading, lumbar spine extension and rotation. Strong distal strength without clonus, no pain upon palpation of greater trochanters. Sensation intact bilaterally. Walks independently, gait steady.   Skin:    General: Skin is warm and dry.     Capillary Refill: Capillary refill takes less than 2 seconds.  Neurological:     General: No focal deficit present.     Mental Status: She is alert and oriented to person, place, and time.   Psychiatric:        Mood and Affect: Mood normal.    Ortho Exam  Imaging: No results found.  Past Medical/Family/Surgical/Social History: Medications & Allergies reviewed per EMR, new medications updated. 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   Right arm weakness 05/27/2019   Type 2 diabetes mellitus without complication ([place]) 29/51/8841   Arthritis of carpometacarpal (CMC) joint of right thumb 04/16/2019   Mixed conductive and sensorineural hearing loss of left ear with restricted hearing of right ear 04/26/2018   Vertigo 04/26/2018   Venous stasis dermatitis of left lower extremity 04/09/2018   Achilles tendon contracture, left 04/09/2018   Pain in left ankle and joints of left foot 04/09/2018   Primary osteoarthritis of first carpometacarpal joint of right hand 10/09/2017   Bilateral hand pain 10/05/2017   Primary osteoarthritis of both first carpometacarpal joints 10/05/2017   Trochanteric bursitis, right hip 06/29/2017   Degenerative disc disease at L5-S1 level 05/04/2017   History of joint replacement 05/04/2017   Claw toe, acquired, right 04/27/2017   Achilles tendon contracture, right 04/27/2017   Pain in right foot 04/11/2017   Status post left hip replacement 10/21/2016   Chronic back pain 01/14/2016   Migraine 01/14/2016   Essential hypertension 01/22/2015   Past Medical History:  Diagnosis Date   Arthritis    Arthritis    back, wrists   Cancer ([place])    skin cancer   Carpal tunnel syndrome    Complication of anesthesia    DDD (degenerative disc disease)    Diabetes mellitus without complication ([place])    does not take meds   Dysrhythmia 1995   irreg hr   Full dentures    GERD (gastroesophageal reflux disease)    Headache     High cholesterol    History of bronchitis    History of kidney stones    2015   Hypertension    PONV (postoperative nausea and vomiting)    Sleep apnea    pt. does not use a CPAP or BiPAP   Stroke ([place])    Pt. states possible mini stroke in 2014, no deficits   Family History  Problem Relation Age of Onset   Cancer Mother    Diabetes Mother    Heart disease Mother    Hyperlipidemia Mother    Hypertension Mother    Cancer Father    Diabetes Father  Heart disease Father    Hyperlipidemia Father    Hypertension Father    Varicose Veins Father    Cancer Sister    Heart disease Sister    Hyperlipidemia Sister    Cancer Brother    Diabetes Brother    Hyperlipidemia Brother    Past Surgical History:  Procedure Laterality Date   ABDOMINAL HYSTERECTOMY     BACK SURGERY  2004   [id number]   BACK SURGERY     CARDIAC CATHETERIZATION     01/27/15 [facility]): EF 60%, normal coronaries.   CARPAL TUNNEL RELEASE  09/20/2012   Procedure: CARPAL TUNNEL RELEASE;  Surgeon: Cammie Sickle., MD;  Location: [place];  Service: Orthopedics;  Laterality: Left;   CARPOMETACARPEL SUSPENSION PLASTY Right 12/12/2018   Procedure: RIGHT THUMB CARPOMETACARPAL [facility]) ARTHROPLASTY;  Surgeon: Leandrew Koyanagi, MD;  Location: [place];  Service: Orthopedics;  Laterality: Right;   CARPOMETACARPEL SUSPENSION PLASTY Left 12/30/2019   Procedure: LEFT THUMB CARPOMETACARPAL ([place]) ARTHROPLASTY;  Surgeon: Leandrew Koyanagi, MD;  Location: [place];  Service: Orthopedics;  Laterality: Left;   CATARACT EXTRACTION, BILATERAL     CHOLECYSTECTOMY  07/2016   done [place]   DORSAL COMPARTMENT RELEASE  09/20/2012   Procedure: RELEASE DORSAL COMPARTMENT ([place]);  Surgeon: Cammie Sickle., MD;  Location[facility];  Service: Orthopedics;  Laterality: Left;   EYE SURGERY     bilat cataracts   EYE SURGERY     HERNIA REPAIR Left 1990   left groin     LOWER EXTREMITY ANGIOGRAM Left 06/08/2015   Procedure: Ascending Venogram  Iliac Vein ;  Surgeon: Elam Dutch, MD;  [facility];  Service: Vascular;  Laterality: Left;  ;  [facility] [place] HAVE BEEN NOTIFIED/ CONFIRMED   PARTIAL HYSTERECTOMY     SHOULDER SURGERY     TOTAL HIP ARTHROPLASTY Left 10/21/2016   Procedure: LEFT TOTAL HIP ARTHROPLASTY ANTERIOR APPROACH;  Surgeon: Mcarthur Rossetti, MD;  Location: [facility];  Service: Orthopedics;  Laterality: Left;   WRIST SURGERY Bilateral    carpal tunnel   Social History   Occupational History   Occupation: [employer], art: [employer]  Tobacco Use   Smoking status: Never   Smokeless tobacco: Never  Substance and Sexual Activity   Alcohol use: No   Drug use: No   Sexual activity: Not on file

## 2021-12-16 ENCOUNTER — Encounter: Payer: Self-pay | Admitting: Physical Medicine and Rehabilitation

## 2021-12-16 ENCOUNTER — Ambulatory Visit: Payer: PPO | Admitting: Physical Medicine and Rehabilitation

## 2021-12-16 ENCOUNTER — Ambulatory Visit: Payer: Self-pay

## 2021-12-16 ENCOUNTER — Other Ambulatory Visit: Payer: Self-pay

## 2021-12-16 VITALS — BP 171/90 | HR 69

## 2021-12-16 DIAGNOSIS — M961 Postlaminectomy syndrome, not elsewhere classified: Secondary | ICD-10-CM

## 2021-12-16 DIAGNOSIS — M5416 Radiculopathy, lumbar region: Secondary | ICD-10-CM | POA: Diagnosis not present

## 2021-12-16 DIAGNOSIS — G894 Chronic pain syndrome: Secondary | ICD-10-CM

## 2021-12-16 MED ORDER — METHYLPREDNISOLONE ACETATE 80 MG/ML IJ SUSP: 80.0000 mg | Freq: Once | INTRAMUSCULAR | Status: DC

## 2021-12-16 NOTE — Patient Instructions (Signed)

## 2021-12-16 NOTE — Progress Notes (Signed)
Pt lower back pain that travels posterior of her left leg. Pt state she feel pain in her buttocks. Pt state she been doing excise to help ease her pain.  Pt state walking and sitting makes the pain worse. Pt state she takes pain meds to help ease her pain. Numeric Pain Rating Scale and Functional Assessment Average Pain 8   In the last MONTH (on 0-10 scale) has pain interfered with the following?  1. General activity like being  able to carry out your everyday physical activities such as walking, climbing stairs, carrying groceries, or moving a chair?  Rating(10)   +Driver, -BT, -Dye Allergies.

## 2021-12-19 ENCOUNTER — Emergency Department (HOSPITAL_COMMUNITY): Payer: PPO

## 2021-12-19 ENCOUNTER — Encounter (HOSPITAL_COMMUNITY): Payer: Self-pay | Admitting: Emergency Medicine

## 2021-12-19 ENCOUNTER — Emergency Department (HOSPITAL_COMMUNITY)
Admission: EM | Admit: 2021-12-19 | Discharge: 2021-12-19 | Disposition: A | Discharge status: Home / Self Care | Payer: PPO | Attending: Emergency Medicine | Admitting: Emergency Medicine

## 2021-12-19 ENCOUNTER — Other Ambulatory Visit: Payer: Self-pay

## 2021-12-19 DIAGNOSIS — R519 Headache, unspecified: Secondary | ICD-10-CM | POA: Diagnosis not present

## 2021-12-19 DIAGNOSIS — M79641 Pain in right hand: Secondary | ICD-10-CM | POA: Diagnosis not present

## 2021-12-19 DIAGNOSIS — I10 Essential (primary) hypertension: Secondary | ICD-10-CM | POA: Diagnosis not present

## 2021-12-19 DIAGNOSIS — R202 Paresthesia of skin: Secondary | ICD-10-CM | POA: Diagnosis not present

## 2021-12-19 DIAGNOSIS — R001 Bradycardia, unspecified: Secondary | ICD-10-CM | POA: Diagnosis not present

## 2021-12-19 DIAGNOSIS — R29818 Other symptoms and signs involving the nervous system: Secondary | ICD-10-CM | POA: Diagnosis not present

## 2021-12-19 DIAGNOSIS — Z20822 Contact with and (suspected) exposure to covid-19: Secondary | ICD-10-CM | POA: Insufficient documentation

## 2021-12-19 DIAGNOSIS — Z79899 Other long term (current) drug therapy: Secondary | ICD-10-CM | POA: Insufficient documentation

## 2021-12-19 DIAGNOSIS — M542 Cervicalgia: Secondary | ICD-10-CM | POA: Diagnosis not present

## 2021-12-19 DIAGNOSIS — Z7982 Long term (current) use of aspirin: Secondary | ICD-10-CM | POA: Insufficient documentation

## 2021-12-19 LAB — CBC WITH DIFFERENTIAL/PLATELET
Abs Immature Granulocytes: 0.05 10*3/uL (ref 0.00–0.07)
Basophils Absolute: 0.1 10*3/uL (ref 0.0–0.1)
Basophils Relative: 1 %
Eosinophils Absolute: 0.2 10*3/uL (ref 0.0–0.5)
Eosinophils Relative: 3 %
HCT: 45.1 % (ref 36.0–46.0)
Hemoglobin: 14.6 g/dL (ref 12.0–15.0)
Immature Granulocytes: 1 %
Lymphocytes Relative: 33 %
Lymphs Abs: 3.2 10*3/uL (ref 0.7–4.0)
MCH: 30.2 pg (ref 26.0–34.0)
MCHC: 32.4 g/dL (ref 30.0–36.0)
MCV: 93.2 fL (ref 80.0–100.0)
Monocytes Absolute: 0.7 10*3/uL (ref 0.1–1.0)
Monocytes Relative: 7 %
Neutro Abs: 5.4 10*3/uL (ref 1.7–7.7)
Neutrophils Relative %: 55 %
Platelets: 296 10*3/uL (ref 150–400)
RBC: 4.84 MIL/uL (ref 3.87–5.11)
RDW: 12.7 % (ref 11.5–15.5)
WBC: 9.6 10*3/uL (ref 4.0–10.5)
nRBC: 0 % (ref 0.0–0.2)

## 2021-12-19 LAB — COMPREHENSIVE METABOLIC PANEL
ALT: 14 U/L (ref 0–44)
AST: 23 U/L (ref 15–41)
Albumin: 4.3 g/dL (ref 3.5–5.0)
Alkaline Phosphatase: 111 U/L (ref 38–126)
Anion gap: 7 (ref 5–15)
BUN: 16 mg/dL (ref 8–23)
CO2: 22 mmol/L (ref 22–32)
Calcium: 9 mg/dL (ref 8.9–10.3)
Chloride: 107 mmol/L (ref 98–111)
Creatinine, Ser: 1.13 mg/dL — ABNORMAL HIGH (ref 0.44–1.00)
GFR, Estimated: 54 mL/min — ABNORMAL LOW (ref >60)
Glucose, Bld: 114 mg/dL — ABNORMAL HIGH (ref 70–99)
Potassium: 3.8 mmol/L (ref 3.5–5.1)
Sodium: 136 mmol/L (ref 135–145)
Total Bilirubin: 0.5 mg/dL (ref 0.3–1.2)
Total Protein: 7.4 g/dL (ref 6.5–8.1)

## 2021-12-19 LAB — URINALYSIS, ROUTINE W REFLEX MICROSCOPIC
Bilirubin Urine: NEGATIVE
Glucose, UA: NEGATIVE mg/dL
Hgb urine dipstick: NEGATIVE
Ketones, ur: NEGATIVE mg/dL
Leukocytes,Ua: NEGATIVE
Nitrite: NEGATIVE
Protein, ur: NEGATIVE mg/dL
Specific Gravity, Urine: 1.006 (ref 1.005–1.030)
pH: 7 (ref 5.0–8.0)

## 2021-12-19 LAB — RAPID URINE DRUG SCREEN, HOSP PERFORMED
Amphetamines: NOT DETECTED
Barbiturates: NOT DETECTED
Benzodiazepines: NOT DETECTED
Cocaine: NOT DETECTED
Opiates: NOT DETECTED
Tetrahydrocannabinol: NOT DETECTED

## 2021-12-19 LAB — PROTIME-INR
INR: 1 (ref 0.8–1.2)
Prothrombin Time: 13.3 seconds (ref 11.4–15.2)

## 2021-12-19 LAB — I-STAT CHEM 8, ED
BUN: 17 mg/dL (ref 8–23)
Calcium, Ion: 1.12 mmol/L — ABNORMAL LOW (ref 1.15–1.40)
Chloride: 108 mmol/L (ref 98–111)
Creatinine, Ser: 1.1 mg/dL — ABNORMAL HIGH (ref 0.44–1.00)
Glucose, Bld: 113 mg/dL — ABNORMAL HIGH (ref 70–99)
HCT: 45 % (ref 36.0–46.0)
Hemoglobin: 15.3 g/dL — ABNORMAL HIGH (ref 12.0–15.0)
Potassium: 3.8 mmol/L (ref 3.5–5.1)
Sodium: 138 mmol/L (ref 135–145)
TCO2: 22 mmol/L (ref 22–32)

## 2021-12-19 LAB — ETHANOL: Alcohol, Ethyl (B): <10 mg/dL (ref <10)

## 2021-12-19 LAB — TROPONIN I (HIGH SENSITIVITY)
Troponin I (High Sensitivity): 7 ng/L (ref <18)
Troponin I (High Sensitivity): 5 ng/L (ref <18)

## 2021-12-19 LAB — RESP PANEL BY RT-PCR (FLU A&B, COVID) ARPGX2
Influenza A by PCR: NEGATIVE
Influenza B by PCR: NEGATIVE
SARS Coronavirus 2 by RT PCR: NEGATIVE

## 2021-12-19 LAB — APTT: aPTT: 27 seconds (ref 24–36)

## 2021-12-19 LAB — TSH: TSH: 0.473 u[IU]/mL (ref 0.350–4.500)

## 2021-12-19 LAB — LIPASE, BLOOD: Lipase: 51 U/L (ref 11–51)

## 2021-12-19 MED ORDER — PROCHLORPERAZINE EDISYLATE 10 MG/2ML IJ SOLN
10.0000 mg | Freq: Once | INTRAMUSCULAR | Status: AC
Start: 2021-12-19 — End: 2021-12-19
  Administered 2021-12-19: 10 mg via INTRAVENOUS
  Filled 2021-12-19: qty 2

## 2021-12-19 NOTE — ED Provider Triage Note (Signed)
Emergency Medicine Provider Triage Evaluation Note  Natalie Bond , a 66 y.o. female  was evaluated in triage.  Pt complains of headache and hypertension. She states about a week ago she was walking and had a sudden onset headache.  She describes it as it felt like someone punched her in the right side of her head.  With that she felt like her vision was blurry and her right eye was droopy.  She also felt dizzy.  She states that the symptoms lasted for about 1 to 2 minutes before resolving however her head has continued to hurt but not as severe.  She denies any fevers.  She states that she does have some right-sided weakness from residual stroke.  She denies any chest pain. She states that she is taking all of her blood pressure medications, including about an hour prior to arrival she took 6 baby aspirin and her home blood pressure meds. She notes she takes clonidine 4 times a day for hot flashes and has taken them this morning.  She denies any recent dose changes. She does report that her PCP has been treating her for "thyroid issues."  She does not recall the name of the medication she is taking for this.  Review of Systems  Positive: See above Negative:   Physical Exam  BP (!) 240/122 (BP Location: Right Arm)    Pulse 63    Temp 98.2 F (36.8 C) (Oral)    Resp 20    SpO2 98%  Gen:   Awake, no distress   Resp:  Normal effort  MSK:   Moves extremities without difficulty, 4/5 grip strength right arm, 5/5 left hand Other:  Normal speech.   Medical Decision Making  Medically screening exam initiated at 8:51 AM.  Appropriate orders placed.  Natalie Bond was informed that the remainder of the evaluation will be completed by another provider, this initial triage assessment does not replace that evaluation, and the importance of remaining in the ED until their evaluation is complete.  Patient does have some right-sided grip strength weakness when compared to left side however she reports that  this is her baseline from previous stroke. Patient is markedly hypertensive here with ongoing headache. Will check labs, CT head, chest x-ray. She denies specific chest, back pain, or new abdominal pain. Charge RN notified by secure chat at [phone] about patients symptoms and HTN to expedite room.   Note: Portions of this report may have been transcribed using voice recognition software. Every effort was made to ensure accuracy; however, inadvertent computerized transcription errors may be present     Lorin Glass, PA-C 12/19/21 [phone]

## 2021-12-19 NOTE — ED Notes (Signed)
ED Provider at bedside. 

## 2021-12-19 NOTE — ED Notes (Signed)
Ambulated to bathroom with steady gait

## 2021-12-19 NOTE — ED Triage Notes (Signed)
Patient complaint of high blood pressure for 1 week, states she is on BP meds and has been taking appropriately. Patient also states she thinks she had a stroke last week but didn't come to be seen.

## 2021-12-19 NOTE — ED Notes (Signed)
Patient transported to MRI 

## 2021-12-19 NOTE — ED Provider Notes (Signed)
[facility] EMERGENCY DEPARTMENT Provider Note   CSN: [id number] Arrival date & time: 12/19/21  1191     History  Chief Complaint  Patient presents with   Hypertension    Natalie Bond is a 66 y.o. female.   Hypertension Associated symptoms include headaches. Pertinent negatives include no chest pain and no shortness of breath. Patient presents with hypertension headache and some tingling and pain in her right hand.  Has had for the last week.  States she is on metoprolol and clonidine.  States she is been taking clonidine every hour.  States it is still elevated.  Headache is dull and diffuse over her head.  Similar to previous headaches.  States she normally does not get numbness in the hand with her high blood pressure.  Also right-sided neck pain which is not unusual for her.  No chest pain.  No trouble breathing.     Home Medications Prior to Admission medications   Medication Sig Start Date End Date Taking? Authorizing Provider  aspirin 81 MG tablet Take 81 mg by mouth daily.      [provider]  atorvastatin (LIPITOR) 20 MG tablet Take 1 tablet (20 mg total) by mouth daily at 6 PM. 05/29/19   Black, Lezlie Octave, NP  butalbital-acetaminophen-caffeine (FIORICET) 50-325-40 MG tablet Take 1 tablet by mouth every 12 (twelve) hours as needed for headache. 05/29/19   Black, Lezlie Octave, NP  cloNIDine (CATAPRES) 0.1 MG tablet Take 0.1 mg by mouth 2 (two) times daily as needed (Blood pressure is over 160). If BP is over 160    [provider]  cyclobenzaprine (FLEXERIL) 10 MG tablet Take 10 mg by mouth 3 (three) times daily as needed.    [provider]  furosemide (LASIX) 20 MG tablet Take 20 mg by mouth daily. 08/28/19   [provider]  gabapentin (NEURONTIN) 100 MG capsule Take 1 capsule (100 mg total) by mouth at bedtime. 05/04/17   Jessy Oto, MD  gabapentin (NEURONTIN) 100 MG capsule TAKE 1 CAPSULE BY MOUTH THREE TIMES DAILY 01/08/21    Aundra Dubin, PA-C  HYDROcodone-acetaminophen (NORCO) 5-325 MG tablet Take 1-2 tablets by mouth 2 (two) times daily as needed for moderate pain. 01/28/20   Aundra Dubin, PA-C  ibuprofen (ADVIL,MOTRIN) 800 MG tablet Take 1 tablet (800 mg total) by mouth every 8 (eight) hours as needed. 01/09/19   Aundra Dubin, PA-C  magnesium 30 MG tablet Take 30 mg by mouth 2 (two) times daily.    [provider]  meloxicam (MOBIC) 15 MG tablet Take 15 mg by mouth daily as needed. 07/25/19   [provider]  metoprolol (TOPROL-XL) 50 MG 24 hr tablet Take 50 mg by mouth daily.      [provider]  nortriptyline (PAMELOR) 25 MG capsule Take 25 mg by mouth at bedtime.    [provider]  omeprazole (PRILOSEC) 20 MG capsule Take 20 mg by mouth daily.    [provider]  ondansetron (ZOFRAN) 4 MG tablet Take 1-2 tablets (4-8 mg total) by mouth every 8 (eight) hours as needed for nausea or vomiting. 12/12/18   Leandrew Koyanagi, MD  ondansetron (ZOFRAN) 4 MG tablet Take 1-2 tablets (4-8 mg total) by mouth every 8 (eight) hours as needed for nausea or vomiting. 12/29/19   Leandrew Koyanagi, MD  Potassium 75 MG TABS Take 75 mg by mouth daily.     [provider]  predniSONE (DELTASONE) 50 MG tablet Take 1 tablet daily with food for 5 days until finished 12/24/19   Magnus Sinning, MD  telmisartan-hydrochlorothiazide (MICARDIS HCT) 80-12.5 MG tablet Take 1 tablet by mouth daily. 03/18/19   [provider]  topiramate (TOPAMAX) 25 MG tablet Take 25 mg by mouth at bedtime. 08/08/16   [provider]  venlafaxine (EFFEXOR) 75 MG tablet Take 75 mg by mouth at bedtime as needed.     [provider]      Allergies    Dilaudid [hydromorphone hcl], Dilaudid [hydromorphone hcl], Penicillins, and Penicillins    Review of Systems   Review of Systems  Constitutional:  Negative for appetite change.  HENT:  Negative for congestion.   Eyes:  Negative for  visual disturbance.  Respiratory:  Negative for shortness of breath.   Cardiovascular:  Negative for chest pain.  Genitourinary:  Negative for flank pain.  Musculoskeletal:  Positive for back pain and neck pain.  Skin:  Negative for rash.  Neurological:  Positive for numbness and headaches.  Psychiatric/Behavioral:  Negative for confusion.    Physical Exam Updated Vital Signs BP (!) 161/100 (BP Location: Left Arm)    Pulse 64    Temp 98.3 F (36.8 C) (Oral)    Resp 16    SpO2 99%  Physical Exam Vitals and nursing note reviewed.  Neck:     Comments: Some tenderness to musculature on right side of neck. Cardiovascular:     Rate and Rhythm: Bradycardia present.  Pulmonary:     Breath sounds: No wheezing or rhonchi.  Abdominal:     Tenderness: There is no abdominal tenderness.  Musculoskeletal:        General: No tenderness.     Cervical back: Neck supple.  Skin:    General: Skin is warm.  Neurological:     Mental Status: She is alert.     Comments: Awake and appropriate.  Face symmetric.  Some paresthesias over right hand particularly over ulnar distribution.  Good grip strength.  Good radial medial and ulnar distribution strength.  Normal bilateral upper extremity strength.  Strength grossly intact in bilateral lower extremities    ED Results / Procedures / Treatments   Labs (all labs ordered are listed, but only abnormal results are displayed) Labs Reviewed  COMPREHENSIVE METABOLIC PANEL - Abnormal; Notable for the following components:      Result Value   Glucose, Bld 114 (*)    Creatinine, Ser 1.13 (*)    GFR, Estimated 54 (*)    All other components within normal limits  URINALYSIS, ROUTINE W REFLEX MICROSCOPIC - Abnormal; Notable for the following components:   Color, Urine STRAW (*)    All other components within normal limits  I-STAT CHEM 8, ED - Abnormal; Notable for the following components:   Creatinine, Ser 1.10 (*)    Glucose, Bld 113 (*)    Calcium, Ion  1.12 (*)    Hemoglobin 15.3 (*)    All other components within normal limits  RESP PANEL BY RT-PCR (FLU A&B, COVID) ARPGX2  CBC WITH DIFFERENTIAL/PLATELET  LIPASE, BLOOD  ETHANOL  PROTIME-INR  APTT  RAPID URINE DRUG SCREEN, HOSP PERFORMED  TSH  TROPONIN I (HIGH SENSITIVITY)  TROPONIN I (HIGH SENSITIVITY)    EKG EKG Interpretation  Date/Time:  Sunday December 19 2021 08:52:50 EST Ventricular Rate:  56 PR Interval:  138 QRS Duration: 84 QT Interval:  428 QTC Calculation: 413 R Axis:   -4 Text Interpretation: Sinus  bradycardia Cannot rule out Anterior infarct , age undetermined Abnormal ECG When compared with ECG of 19-Sep-2012 16:09, PREVIOUS ECG IS PRESENT Confirmed by Davonna Belling [phone]) on 12/19/2021 9:39:22 AM  Radiology DG Chest 2 View  Result Date: 12/19/2021 CLINICAL DATA:  High blood pressure for 1 week. EXAM: CHEST - 2 VIEW COMPARISON:  None. FINDINGS: Normal cardiac and mediastinal contours. No consolidative pulmonary opacities. No pleural effusion or pneumothorax. Thoracic spine degenerative changes. IMPRESSION: No acute cardiopulmonary process. Electronically Signed   By: Lovey Newcomer M.D.   On: 12/19/2021 09:36   CT HEAD WO CONTRAST  Result Date: 12/19/2021 CLINICAL DATA:  Headache. EXAM: CT HEAD WITHOUT CONTRAST TECHNIQUE: Contiguous axial images were obtained from the base of the skull through the vertex without intravenous contrast. RADIATION DOSE REDUCTION: This exam was performed according to the departmental dose-optimization program which includes automated exposure control, adjustment of the mA and/or kV according to patient size and/or use of iterative reconstruction technique. COMPARISON:  Brain CT 10/30/2019. FINDINGS: Brain: Ventricles and sulci are appropriate for patient's age. No evidence for acute cortically based infarct, intracranial hemorrhage, mass lesion or mass-effect. Vascular: Unremarkable Skull: Intact. Sinuses/Orbits: Paranasal sinuses and  mastoid air cells are unremarkable. Orbits are unremarkable. Other: None. IMPRESSION: No acute intracranial process. Electronically Signed   By: Lovey Newcomer M.D.   On: 12/19/2021 09:41   MR BRAIN WO CONTRAST  Result Date: 12/19/2021 CLINICAL DATA:  Acute neuro deficit.  Rule out stroke EXAM: MRI HEAD WITHOUT CONTRAST TECHNIQUE: Multiplanar, multiecho pulse sequences of the brain and surrounding structures were obtained without intravenous contrast. COMPARISON:  CT head 12/19/2021 FINDINGS: Brain: No acute infarction, hemorrhage, hydrocephalus, extra-axial collection or mass lesion. Mild white matter changes. Scattered small white matter hyperintensities bilaterally. Brainstem posterior fossa are normal. Vascular: Normal arterial flow voids. Skull and upper cervical spine: No focal abnormality. Sinuses/Orbits: Paranasal sinuses clear. Bilateral cataract extraction Other: None IMPRESSION: No acute abnormality. Mild white matter changes most likely chronic microvascular ischemia. Electronically Signed   By: Franchot Gallo M.D.   On: 12/19/2021 13:28    Procedures Procedures    Medications Ordered in ED Medications  prochlorperazine (COMPAZINE) injection 10 mg (10 mg Intravenous Given 12/19/21 1025)    ED Course/ Medical Decision Making/ A&P                           Medical Decision Making Problems Addressed: Hypertension, unspecified type: complicated acute illness or injury  Amount and/or Complexity of Data Reviewed Labs: ordered. Decision-making details documented in ED Course. Radiology: ordered and independent interpretation performed. ECG/medicine tests: independent interpretation performed.  Risk Prescription drug management.   Patient presents with hypertension.  History of same.  On various medications including reportedly every hour clonidine at home.  Has headache.  Blood pressure reportedly elevated despite medications at home.  States some various findings on her right hand.   States some pain.  Also pain in the neck.  Has had similar symptoms in the past.  Sometimes thought to be TIA but also thought could be related to the migraine.  Head CT done reassuring.  Creatinine just slightly above prior.  Treated as a potential complicated migraine also as the headache as a cause of the hypertension.  Felt better somewhat after the treatment.  Blood pressure decreased.  Feeling better.  MRI done due to potential focal neurodeficits was negative.  Blood pressure improved and will have follow-up with either PCP or potentially  the hypertension clinic since she has been difficult to control.  Doubt this is acute stroke.  Do not appear to have severe endorgan damage with the blood pressure.  Discharge home.        Final Clinical Impression(s) / ED Diagnoses Final diagnoses:  Hypertension, unspecified type    Rx / DC Orders ED Discharge Orders     None         Davonna Belling, MD 12/19/21 [phone]

## 2021-12-19 NOTE — Discharge Instructions (Addendum)
The hand pain does not appear to be from a stroke.  Your blood pressure is improved.  The headache is also doing better.  Follow-up with your doctor and potentially with the hypertension clinic.

## 2022-01-01 NOTE — Progress Notes (Signed)
Natalie Bond - 66 y.o. female MRN [id number]  Date of birth: 18-Feb-1956  Office Visit Note: Visit Date: 12/16/2021 PCP: Donnajean Lopes, MD Referred by: Donnajean Lopes, MD  Subjective: Chief Complaint  Patient presents with   Lower Back - Pain   Left Leg - Pain   HPI:  Natalie Bond is a 66 y.o. female who comes in today at the request of Barnet Pall, FNP for planned Left S1-2 Lumbar Transforaminal epidural steroid injection with fluoroscopic guidance.  The patient has failed conservative care including home exercise, medications, time and activity modification.  This injection will be diagnostic and hopefully therapeutic.  Please see requesting physician notes for further details and justification.   ROS Otherwise per HPI.  Assessment & Plan: Visit Diagnoses:    ICD-10-CM   1. Lumbar radiculopathy  M54.16 XR C-ARM NO REPORT    Epidural Steroid injection    methylPREDNISolone acetate (DEPO-MEDROL) injection 80 mg    2. Post laminectomy syndrome  M96.1     3. Chronic pain syndrome  G89.4       Plan: No additional findings.   Meds & Orders:  Meds ordered this encounter  Medications   methylPREDNISolone acetate (DEPO-MEDROL) injection 80 mg    Orders Placed This Encounter  Procedures   XR C-ARM NO REPORT   Epidural Steroid injection    Follow-up: Return if symptoms worsen or fail to improve.   Procedures: No procedures performed  S1 Lumbosacral Transforaminal Epidural Steroid Injection - Sub-Pedicular Approach with Fluoroscopic Guidance   [name]      Date of Birth: [birth year] MRN: [id number] PCP: Donnajean Lopes, MD      Visit Date: 12/16/2021   Universal Protocol:    Date/Time: 01/01/2309:59 AM  Consent Given By: the patient  Position:  PRONE  Additional Comments: Vital signs were monitored before and after the procedure. Patient was prepped and draped in the usual sterile fashion. The correct patient, procedure, and site  was verified.   Injection Procedure Details:  Procedure Site One Meds Administered:  Meds ordered this encounter  Medications   methylPREDNISolone acetate (DEPO-MEDROL) injection 80 mg    Laterality: Left  Location/Site:  S1 Foramen   Needle size: 22 ga.  Needle type: Spinal  Needle Placement: Transforaminal  Findings:   -Comments: Excellent flow of contrast along the nerve, nerve root and into the epidural space.  Epidurogram: Contrast epidurogram showed no nerve root cut off or restricted flow pattern.  Procedure Details: After squaring off the sacral end-plate to get a true AP view, the C-arm was positioned so that the best possible view of the S1 foramen was visualized. The soft tissues overlying this structure were infiltrated with 2-3 ml. of 1% Lidocaine without Epinephrine.    The spinal needle was inserted toward the target using a "trajectory" view along the fluoroscope beam.  Under AP and lateral visualization, the needle was advanced so it did not puncture dura. Biplanar projections were used to confirm position. Aspiration was confirmed to be negative for CSF and/or blood. A 1-2 ml. volume of Isovue-250 was injected and flow of contrast was noted at each level. Radiographs were obtained for documentation purposes.   After attaining the desired flow of contrast documented above, a 0.5 to 1.0 ml test dose of 0.25% Marcaine was injected into each respective transforaminal space.  The patient was observed for 90 seconds post injection.  After no sensory deficits were reported, and normal lower extremity  motor function was noted,   the above injectate was administered so that equal amounts of the injectate were placed at each foramen (level) into the transforaminal epidural space.   Additional Comments:  No complications occurred Dressing: Band-Aid with 2 x 2 sterile gauze    Post-procedure details: Patient was observed during the procedure. Post-procedure  instructions were reviewed.  Patient left the clinic in stable condition.   Clinical History: HISTORY: Bilateral hip pain  TECHNIQUE: 2 view bilateral hip study  FINDINGS: Right hip shows no acute osseous abnormalities. Dystrophic calcifications are seen in the soft tissues adjacent to the greater trochanter most likely age related but could represent old injury. No articular irregularities. Unremarkable SI  joints.  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 No evidence of loosening or periarticular fractures. Alignment unremarkable. Unremarkable left SI joint Other Result Information Acute Interface, Incoming Rad Results - 11/11/2019  4:06 PM EST HISTORY: Bilateral hip pain  TECHNIQUE: 2 view bilateral hip study  FINDINGS: Right hip shows no acute osseous abnormalities. Dystrophic calcifications are seen in the soft tissues adjacent to the greater trochanter most likely age related but could represent old injury. No articular irregularities. Unremarkable SI  joints.  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 No evidence of loosening or periarticular fractures. Alignment unremarkable. Unremarkable left SI joint   IMPRESSION: No acute osseous or soft tissue findings.  Electronically Signed by: Satira Sark   ----- MRI LUMBAR SPINE WITHOUT CONTRAST     TECHNIQUE:  Multiplanar, multisequence MR imaging of the lumbar spine was  performed. No intravenous contrast was administered.     COMPARISON:  12/18/2015 lumbar spine MRI.     FINDINGS:  Segmentation:  Standard.     Alignment: Mild lumbar levocurvature with apex at L3. Normal lumbar  lordosis without listhesis.     Vertebrae: No fracture, evidence of discitis, or bone lesion.  Right-sided L4-5 facet edema.     Conus medullaris and cauda equina: Conus extends to the L1-2 level.  Conus and cauda equina appear normal.     Paraspinal and other soft tissues: Chronic postsurgical changes  related to left L5-S1  hemilaminectomy. No fluid collection.     Disc levels:     T12-L1: No significant disc displacement, foraminal stenosis, or  canal stenosis.     L1-2: No significant disc displacement, foraminal stenosis, or canal  stenosis.     L2-3: Stable small left foraminal disc protrusion. No significant  foraminal or canal stenosis.     L3-4: Stable disc bulge and facet hypertrophy. Interval resorption  of right juxta-articular cyst. Mild bilateral recess stenosis. No  foraminal or canal stenosis.     L4-5: Stable disc bulge, facet hypertrophy, and ligamentum flavum  hypertrophy, greater on the left. Mild left lateral recess stenosis.  No foraminal or canal stenosis.     L5-S1: Left hemilaminectomy. Stable left central disc protrusion  with mild left lateral recess stenosis. No significant foraminal or  canal stenosis.     IMPRESSION:  1. No acute fracture.  Mild levocurvature of the lumbar spine.  2. Right L4-5 facet edema, likely degenerative.  3. Stable lumbar spondylosis. No significant foraminal or canal  stenosis.        Electronically Signed    By: Kristine Garbe M.D.    On: 12/01/2018 14:02     Objective:  VS:  HT:     WT:    BMI:      BP:(!) 171/90   HR:69bpm  TEMP: ( )   RESP:  Physical Exam Vitals and nursing note reviewed.  Constitutional:      General: She is not in acute distress.    Appearance: Normal appearance. She is not ill-appearing.  HENT:     Head: Normocephalic and atraumatic.     Right Ear: External ear normal.     Left Ear: External ear normal.  Eyes:     Extraocular Movements: Extraocular movements intact.  Cardiovascular:     Rate and Rhythm: Normal rate.     Pulses: Normal pulses.  Pulmonary:     Effort: Pulmonary effort is normal. No respiratory distress.  Abdominal:     General: There is no distension.     Palpations: Abdomen is soft.  Musculoskeletal:        General: Tenderness present.     Cervical back: Neck supple.      Right lower leg: No edema.     Left lower leg: No edema.     Comments: Patient has good distal strength with no pain over the greater trochanters.  No clonus or focal weakness.  Skin:    Findings: No erythema, lesion or rash.  Neurological:     General: No focal deficit present.     Mental Status: She is alert and oriented to person, place, and time.     Sensory: No sensory deficit.     Motor: No weakness or abnormal muscle tone.     Coordination: Coordination normal.  Psychiatric:        Mood and Affect: Mood normal.        Behavior: Behavior normal.     Imaging: No results found.

## 2022-01-01 NOTE — Procedures (Signed)
S1 Lumbosacral Transforaminal Epidural Steroid Injection - Sub-Pedicular Approach with Fluoroscopic Guidance   [name]      Date of Birth: 1956/03/22 MRN: [id number] PCP: Donnajean Lopes, MD      Visit Date: 12/16/2021   Universal Protocol:    Date/Time: 01/01/2309:59 AM  Consent Given By: the patient  Position:  PRONE  Additional Comments: Vital signs were monitored before and after the procedure. Patient was prepped and draped in the usual sterile fashion. The correct patient, procedure, and site was verified.   Injection Procedure Details:  Procedure Site One Meds Administered:  Meds ordered this encounter  Medications   methylPREDNISolone acetate (DEPO-MEDROL) injection 80 mg    Laterality: Left  Location/Site:  S1 Foramen   Needle size: 22 ga.  Needle type: Spinal  Needle Placement: Transforaminal  Findings:   -Comments: Excellent flow of contrast along the nerve, nerve root and into the epidural space.  Epidurogram: Contrast epidurogram showed no nerve root cut off or restricted flow pattern.  Procedure Details: After squaring off the sacral end-plate to get a true AP view, the C-arm was positioned so that the best possible view of the S1 foramen was visualized. The soft tissues overlying this structure were infiltrated with 2-3 ml. of 1% Lidocaine without Epinephrine.    The spinal needle was inserted toward the target using a "trajectory" view along the fluoroscope beam.  Under AP and lateral visualization, the needle was advanced so it did not puncture dura. Biplanar projections were used to confirm position. Aspiration was confirmed to be negative for CSF and/or blood. A 1-2 ml. volume of Isovue-250 was injected and flow of contrast was noted at each level. Radiographs were obtained for documentation purposes.   After attaining the desired flow of contrast documented above, a 0.5 to 1.0 ml test dose of 0.25% Marcaine was injected into  each respective transforaminal space.  The patient was observed for 90 seconds post injection.  After no sensory deficits were reported, and normal lower extremity motor function was noted,   the above injectate was administered so that equal amounts of the injectate were placed at each foramen (level) into the transforaminal epidural space.   Additional Comments:  No complications occurred Dressing: Band-Aid with 2 x 2 sterile gauze    Post-procedure details: Patient was observed during the procedure. Post-procedure instructions were reviewed.  Patient left the clinic in stable condition.

## 2022-01-17 NOTE — Progress Notes (Signed)
Advanced Hypertension Clinic Initial Assessment:    Date:  01/18/2022   ID:  Natalie Bond, Natalie Bond [birth year], MRN [id number]  PCP:  Donnajean Lopes, MD  Cardiologist:  None  Nephrologist:  Referring MD: Davonna Belling, MD   CC: Hypertension  History of Present Illness:    Natalie Bond is a 66 y.o. female with a hx of hypertension, hyperlipidemia, diabetes, GERD, sleep apnea not on CPAP, skin cancer, nephrolithiasis, and arthritis, here to establish care in the Advanced Hypertension Clinic. Natalie Bond presented to the ED on 12/19/21 with headaches. Her blood pressure was 161/100. At the time she was on clonidine, metoprolol, prednisone, telmisartan/HCTZ, ibuprofen and meloxicam.  Today, she reports having hypertension since she was 66 yo. When she was first diagnosed there were no clear triggers, although she initially developed "banging" headaches. Her blood pressure has always been difficult to control, but is worsening lately. She affirms having multiple life stressors currently. Generally she has needed to take 3 extra tablets of clonidine daily. Last week she was taking clonidine 3 times daily (morning, lunch, dinner) and her BP was stable in the 150s. This week her blood pressure has been more elevated. When her blood pressure is high, she can hear her heartbeat in her ear. At times, she also complains of swelling and pain in her dorsal neck. She has been told this may be due to her migraines. Additionally, she states her hot flashes are severe. She has had 3 hot flashes while waiting for this appointment today. This seems to correlate with her higher blood pressures as well. Of note, she reports a previous small systemic stroke, about 2 years ago. 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 She was unable to continue her employment. Currently she carries 81 mg aspirin with her. For activity she performs the exercises she  was given during PT for her hip. She is interested in joining [employer]. Her diet consists of a mixture of cooking at home and ordering out. However, she states her meals at home are not as healthy such as fried foods but she is limiting her salt. Typically she drinks 2 glasses of tea a day, no coffee. Recently she had one alcoholic drink, which was the first in 6 months. She is taking Mobic every 3 days for her arthritis. This causes GI issues, so she is unable to tolerate more frequent doses. She confirms easy bruising and slow healing while on Aspirin. She denies any palpitations, chest pain, shortness of breath, or peripheral edema. No syncope, orthopnea, or PND.  Previous antihypertensives: N/a  Past Medical History:  Diagnosis Date   Arthritis    Arthritis    back, wrists   Cancer ([place])    skin cancer   Carpal tunnel syndrome    Complication of anesthesia    DDD (degenerative disc disease)    Diabetes mellitus without complication (HCC)    does not take meds   Dysrhythmia 1995   irreg hr   Full dentures    GERD (gastroesophageal reflux disease)    Headache    High cholesterol    History of bronchitis    History of kidney stones    2015   Hypertension    PONV (postoperative nausea and vomiting)    Sleep apnea    pt. does not use a CPAP or BiPAP   Stroke ([place])    Pt. states possible mini stroke  in 2014, no deficits    Past Surgical History:  Procedure Laterality Date   ABDOMINAL HYSTERECTOMY     BACK SURGERY  2004   [id number]   BACK SURGERY     CARDIAC CATHETERIZATION     01/27/15 [facility]): EF 60%, normal coronaries.   CARPAL TUNNEL RELEASE  09/20/2012   Procedure: CARPAL TUNNEL RELEASE;  Surgeon: Cammie Sickle., MD;  Location: [place];  Service: Orthopedics;  Laterality: Left;   CARPOMETACARPEL SUSPENSION PLASTY Right 12/12/2018   Procedure: RIGHT THUMB CARPOMETACARPAL [facility]) ARTHROPLASTY;  Surgeon: Leandrew Koyanagi, MD;  Location: [place];  Service: Orthopedics;  Laterality: Right;   CARPOMETACARPEL SUSPENSION PLASTY Left 12/30/2019   Procedure: LEFT THUMB CARPOMETACARPAL ([place]) ARTHROPLASTY;  Surgeon: Leandrew Koyanagi, MD;  Location: [place];  Service: Orthopedics;  Laterality: Left;   CATARACT EXTRACTION, BILATERAL     CHOLECYSTECTOMY  07/2016   done [place]   DORSAL COMPARTMENT RELEASE  09/20/2012   Procedure: RELEASE DORSAL COMPARTMENT ([place]);  Surgeon: Cammie Sickle., MD;  Location: [facility];  Service: Orthopedics;  Laterality: Left;   EYE SURGERY     bilat cataracts   EYE SURGERY     HERNIA REPAIR Left 1990   left groin    LOWER EXTREMITY ANGIOGRAM Left 06/08/2015   Procedure: Ascending Venogram  Iliac Vein ;  Surgeon: Elam Dutch, MD;  Location: [facility] OR;  Service: Vascular;  Laterality: Left;  ;  [facility] [facility] / [place] HAVE BEEN NOTIFIED/ CONFIRMED   PARTIAL HYSTERECTOMY     SHOULDER SURGERY     TOTAL HIP ARTHROPLASTY Left 10/21/2016   Procedure: LEFT TOTAL HIP ARTHROPLASTY ANTERIOR APPROACH;  Surgeon: Mcarthur Rossetti, MD;  Location: [facility];  Service: Orthopedics;  Laterality: Left;   WRIST SURGERY Bilateral    carpal tunnel    Current Medications: Current Meds  Medication Sig   amLODipine (NORVASC) 5 MG tablet Take 1 tablet (5 mg total) by mouth daily.   aspirin 81 MG tablet Take 81 mg by mouth daily.     butalbital-acetaminophen-caffeine (FIORICET) 50-325-40 MG tablet Take 1 tablet by mouth every 12 (twelve) hours as needed for headache.   cyclobenzaprine (FLEXERIL) 10 MG tablet Take 10 mg by mouth 3 (three) times daily as needed.   furosemide (LASIX) 20 MG tablet Take 20 mg by mouth daily.   gabapentin (NEURONTIN) 100 MG capsule Take 100 mg by mouth as needed.   HYDROcodone-acetaminophen (NORCO) 5-325 MG tablet Take 1-2 tablets by mouth 2 (two) times daily as needed for moderate pain.   magnesium 30 MG tablet Take 30 mg by  mouth 2 (two) times daily.   meclizine (ANTIVERT) 25 MG tablet Take 25 mg by mouth as needed.   meloxicam (MOBIC) 15 MG tablet Take 15 mg by mouth daily as needed.   metoprolol (TOPROL-XL) 50 MG 24 hr tablet Take 50 mg by mouth 2 (two) times daily.   ondansetron (ZOFRAN) 4 MG tablet Take 1-2 tablets (4-8 mg total) by mouth every 8 (eight) hours as needed for nausea or vomiting.   pantoprazole (PROTONIX) 40 MG tablet Take 40 mg by mouth daily.   Potassium 75 MG TABS Take 75 mg by mouth daily.    [START ON 02/15/2022] telmisartan-hydrochlorothiazide (MICARDIS HCT) 80-25 MG tablet Take 1 tablet by mouth daily.   topiramate (TOPAMAX) 100 MG tablet Take 100 mg by mouth at bedtime.   [DISCONTINUED] cloNIDine (CATAPRES) 0.1  MG tablet Take 0.1 mg by mouth 2 (two) times daily as needed (Blood pressure is over 160). If BP is over 160   [DISCONTINUED] hydrochlorothiazide (HYDRODIURIL) 12.5 MG tablet Take 1 tablet (12.5 mg total) by mouth daily for 28 days. TO BE TAKEN WITH TELMISARTAN HCT 80-12.5   [DISCONTINUED] omeprazole (PRILOSEC) 20 MG capsule Take 20 mg by mouth daily.   [DISCONTINUED] telmisartan-hydrochlorothiazide (MICARDIS HCT) 80-12.5 MG tablet Take 1 tablet by mouth daily.   [DISCONTINUED] topiramate (TOPAMAX) 25 MG tablet Take 25 mg by mouth at bedtime.   Current Facility-Administered Medications for the 01/18/22 encounter (Office Visit) with Skeet Latch, MD  Medication   methylPREDNISolone acetate (DEPO-MEDROL) injection 80 mg     Allergies:   Dilaudid [hydromorphone hcl], Dilaudid [hydromorphone hcl], Penicillins, and Penicillins   Social History   Socioeconomic History   Marital status: Married    Spouse name: Animator   Number of children: 2   Years of education: college   Highest education level: Not on file  Occupational History   Occupation: [employer] Therapist, art: [employer]  Tobacco Use   Smoking status: Never   Smokeless tobacco: Never  Substance and Sexual  Activity   Alcohol use: No   Drug use: No   Sexual activity: Not on file  Other Topics Concern   Not on file  Social History Narrative   ** Merged History Encounter **       Social Determinants of Health   Financial Resource Strain: Low Risk    Difficulty of Paying Living Expenses: Not hard at all  Food Insecurity: No Food Insecurity   Worried About Charity fundraiser in the Last Year: Never true   [place] in the Last Year: Never true  Transportation Needs: No Transportation Needs   Lack of Transportation (Medical): No   Lack of Transportation (Non-Medical): No  Physical Activity: Insufficiently Active   Days of Exercise per Week: 3 days   Minutes of Exercise per Session: 20 min  Stress: Not on file  Social Connections: Not on file     Family History: The patient's family history includes Cancer in her brother, brother, father, mother, and sister; Diabetes in her brother, father, and mother; Heart disease in her father, mother, and sister; Hyperlipidemia in her brother, father, mother, and sister; Hypertension in her brother, father, mother, and sister; Varicose Veins in her father.  ROS:   Please see the history of present illness.    (+) Migraines/Headaches (+) Stress (+) Edema and pain in dorsal neck (+) Hot flashes (+) Bruises easily All other systems reviewed and are negative.  EKGs/Labs/Other Studies Reviewed:    Echo 05/28/2019:  1. The left ventricle has normal systolic function with an ejection  fraction of 60-65%. The cavity size was normal. Left ventricular diastolic  Doppler parameters are consistent with impaired relaxation. No evidence of left ventricular regional wall motion abnormalities.   2. The mitral valve is grossly normal.   3. The tricuspid valve is grossly normal.   4. The aortic valve is tricuspid. Mild sclerosis of the aortic valve. No  stenosis of the aortic valve.   SUMMARY     LVEF 60-65%, normal wall thickness, normal wall  motion, grade 1 DD,  normal LV filling pressure, normal LA size, mild aortic valve  sclerosis, normal IVC   EKG:   01/18/2022: EKG was not ordered.   Recent Labs: 12/19/2021: ALT 14; BUN 17; Creatinine, Ser 1.10; Hemoglobin 15.3;  Platelets 296; Potassium 3.8; Sodium 138; TSH 0.473   Recent Lipid Panel    Component Value Date/Time   CHOL 217 (H) 05/28/2019 0113   TRIG 383 (H) 05/28/2019 0113   HDL 42 05/28/2019 0113   CHOLHDL 5.2 05/28/2019 0113   VLDL 77 (H) 05/28/2019 0113   LDLCALC 98 05/28/2019 0113    Physical Exam:    VS:  BP (!) 196/90 (BP Location: Left Arm, Patient Position: Sitting, Cuff Size: Large)    Pulse (!) 58    Ht 5\' 7"  (1.702 m)    Wt 185 lb 12.8 oz (84.3 kg)    SpO2 99%    BMI 29.10 kg/m  , BMI Body mass index is 29.1 kg/m. GENERAL:  Well appearing HEENT: Pupils equal round and reactive, fundi not visualized, oral mucosa unremarkable NECK:  No jugular venous distention, waveform within normal limits, carotid upstroke brisk and symmetric, no bruits, no thyromegaly LUNGS:  Clear to auscultation bilaterally HEART:  RRR.  PMI not displaced or sustained,S1 and S2 within normal limits, no S3, no S4, no clicks, no rubs, no murmurs ABD:  Flat, positive bowel sounds normal in frequency in pitch, no bruits, no rebound, no guarding, no midline pulsatile mass, no hepatomegaly, no splenomegaly EXT:  2 plus pulses throughout, no edema, no cyanosis no clubbing SKIN:  No rashes no nodules NEURO:  Cranial nerves II through XII grossly intact, motor grossly intact throughout PSYCH:  Cognitively intact, oriented to person place and time   ASSESSMENT/PLAN:    Essential hypertension Blood pressure has been very labile.  She is taking clonidine sporadically.  Recommend that she stop taking it as it can cause rebound HTN.  If she continues to need something on an as-needed basis we will add hydralazine.  Stop metoprolol and start amlodipine 5mg .  Increase HCTZ to 25mg  and continue  telmisartan 80mg .  Check BMP in one week.  Recommend that she reduce meloxicam use and try Tylenol first.  She is eligible for the Silver sneakers program and is going to start exercising at least 150 minutes weekly.  Continue to limit sodium intake.  She was provided with an advance hypertension clinic booklet and asked to track her blood pressures twice daily.  She is interested in enrolling in our remote patient monitoring study and consents to the [place] remote patient monitoring system.  She is also going to work on limiting her life stressors.  We will also offer access to our care guide for stress management.  TIA (transient ischemic attack) History of TIA.  She had questions about whether or not to continue aspirin and I recommended that she does continue it per neurology recommendation in 2020.  Ideally her LDL should be less than 70.  Given that we are starting other medications we will defer this and discuss in the future.   Screening for Secondary Hypertension:  Causes 01/18/2022  Drugs/Herbals Screened     - Comments clonidine, limited caffeine.  No EtOH.  Meloxicam 3/week.  Renovascular HTN Screened     - Comments Check renal artery Dopplers  Sleep Apnea Screened     - Comments No symptoms  Thyroid Disease Screened  Hyperaldosteronism Not Screened     - Comments Currently on telmisartan  Pheochromocytoma Screened     - Comments She will come back for catecholamines and metanephrines, especially given her frequent hot flashes  Cushing's Syndrome N/A  Hyperparathyroidism Screened  Coarctation of the Aorta Screened     - Comments BP  symmetric  Compliance Screened    Relevant Labs/Studies: Basic Labs Latest Ref Rng & Units 12/19/2021 12/19/2021 12/26/2019  Sodium 135 - 145 mmol/L 138 136 138  Potassium 3.5 - 5.1 mmol/L 3.8 3.8 3.6  Creatinine 0.44 - 1.00 mg/dL 1.10(H) 1.13(H) 1.05(H)    Thyroid  Latest Ref Rng & Units 12/19/2021  TSH 0.350 - 4.500 uIU/mL 0.473    Disposition:     FU with APP/PharmD in 1 month for the next 3 months.   FU with Jiayi Lengacher C. Oval Linsey, MD, [facility] in 4 months.  Medication Adjustments/Labs and Tests Ordered: Current medicines are reviewed at length with the patient today.  Concerns regarding medicines are outlined above.   Orders Placed This Encounter  Procedures   Catecholamines, fractionated, plasma   Metanephrines, plasma   Basic metabolic panel   Cantrils Ladder Assessment   VAS US CAROTID   Meds ordered this encounter  Medications   telmisartan-hydrochlorothiazide (MICARDIS HCT) 80-25 MG tablet    Sig: Take 1 tablet by mouth daily.    Dispense:  90 tablet    Refill:  3    NEW DOSE, PATIENT WILL CALL WHEN NEEDS FILLED   DISCONTD: hydrochlorothiazide (HYDRODIURIL) 12.5 MG tablet    Sig: Take 1 tablet (12.5 mg total) by mouth daily for 28 days. TO BE TAKEN WITH TELMISARTAN HCT 80-12.5    Dispense:  30 tablet    Refill:  0    ONE TIME FILL WILL BE INCREASING TELMISARTAN HCT DOSE   amLODipine (NORVASC) 5 MG tablet    Sig: Take 1 tablet (5 mg total) by mouth daily.    Dispense:  90 tablet    Refill:  3   I,Mathew Stumpf,acting as a scribe for Skeet Latch, MD.,have documented all relevant documentation on the behalf of Skeet Latch, MD,as directed by  Skeet Latch, MD while in the presence of Skeet Latch, MD.  I, [place] Oval Linsey, MD have reviewed all documentation for this visit.  The documentation of the exam, diagnosis, procedures, and orders on 01/18/2022 are all accurate and complete.   Signed, Skeet Latch, MD  01/18/2022 12:36 PM    [place]

## 2022-01-18 ENCOUNTER — Other Ambulatory Visit: Payer: Self-pay

## 2022-01-18 ENCOUNTER — Ambulatory Visit (INDEPENDENT_AMBULATORY_CARE_PROVIDER_SITE_OTHER): Payer: PPO | Admitting: Cardiovascular Disease

## 2022-01-18 ENCOUNTER — Encounter (HOSPITAL_BASED_OUTPATIENT_CLINIC_OR_DEPARTMENT_OTHER): Payer: Self-pay | Admitting: Cardiovascular Disease

## 2022-01-18 VITALS — BP 196/90 | HR 58 | Ht 67.0 in | Wt 185.8 lb

## 2022-01-18 DIAGNOSIS — Z5181 Encounter for therapeutic drug level monitoring: Secondary | ICD-10-CM

## 2022-01-18 DIAGNOSIS — G459 Transient cerebral ischemic attack, unspecified: Secondary | ICD-10-CM | POA: Diagnosis not present

## 2022-01-18 DIAGNOSIS — Z006 Encounter for examination for normal comparison and control in clinical research program: Secondary | ICD-10-CM

## 2022-01-18 DIAGNOSIS — I10 Essential (primary) hypertension: Secondary | ICD-10-CM | POA: Diagnosis not present

## 2022-01-18 MED ORDER — TELMISARTAN-HCTZ 80-25 MG PO TABS: 1.0000 | ORAL_TABLET | Freq: Every day | ORAL | 3 refills | Status: DC

## 2022-01-18 MED ORDER — HYDROCHLOROTHIAZIDE 12.5 MG PO TABS: 12.5000 mg | ORAL_TABLET | Freq: Every day | ORAL | 0 refills | Status: DC

## 2022-01-18 MED ORDER — AMLODIPINE BESYLATE 5 MG PO TABS: 5.0000 mg | ORAL_TABLET | Freq: Every day | ORAL | 3 refills | Status: DC

## 2022-01-18 NOTE — Assessment & Plan Note (Addendum)
Blood pressure has been very labile.  She is taking clonidine sporadically.  Recommend that she stop taking it as it can cause rebound HTN.  If she continues to need something on an as-needed basis we will add hydralazine.  Stop metoprolol and start amlodipine 5mg .  Increase HCTZ to 25mg  and continue telmisartan 80mg .  Check BMP in one week.  Recommend that she reduce meloxicam use and try Tylenol first.  She is eligible for the Silver sneakers program and is going to start exercising at least 150 minutes weekly.  Continue to limit sodium intake.  She was provided with an advance hypertension clinic booklet and asked to track her blood pressures twice daily.  She is interested in enrolling in our remote patient monitoring study and consents to the [place] remote patient monitoring system.  She is also going to work on limiting her life stressors.  We will also offer access to our care guide for stress management.

## 2022-01-18 NOTE — Research (Signed)
Subject Name: Natalie Bond  [name] met inclusion and exclusion criteria for the Virtual Care and Social Determinant Interventions for the management of hypertension trial.  The informed consent form, study requirements and expectations were reviewed with the subject by Dr. Oval Linsey and myself. The subject was given the opportunity to read the consent and ask questions. The subject verbalized understanding of the trial requirements.  All questions were addressed prior to the signing of the consent form. The subject agreed to participate in the trial and signed the informed consent. The informed consent was obtained prior to performance of any protocol-specific procedures for the subject.  A copy of the signed informed consent was given to the subject and a copy was placed in the subject's medical record.  [name] was randomized to Group 2.

## 2022-01-18 NOTE — Assessment & Plan Note (Signed)
History of TIA.  She had questions about whether or not to continue aspirin and I recommended that she does continue it per neurology recommendation in 2020.  Ideally her LDL should be less than 70.  Given that we are starting other medications we will defer this and discuss in the future.

## 2022-01-18 NOTE — Patient Instructions (Signed)
Medication Instructions:  STOP CLONIDINE    START AMLODIPINE 5 MG DAILY   START HYDROCHLOROTHIAZIDE 12.5 MG DAILY. TAKE WITH YOUR TELMISARTAN HCT 80-12.5 MG TABLETS ONCE YOU FINISH YOUR CURRENT TELMISARTAN YOU WILL INCREASE YOUR TELMISARTAN HCT TO 80-25 MG DAILY   Labwork: BMET/CATECHOLAMINES/METANEPHRINES IN 1 WEEK    TRY TO USE TYLENOL AND LIMIT YOUR MELOXICAM     Testing/Procedures: Your physician has requested that you have a carotid duplex. This test is an ultrasound of the carotid arteries in your neck. It looks at blood flow through these arteries that supply the brain with blood. Allow one hour for this exam. There are no restrictions or special instructions.  Follow-Up: 03/17/2022 1:30 PM WITH PHARM D AT [facility] OFFICE  05/09/2022 2:30 PM DR [place] AT [facility] OFFICE    Special Instructions:   AM[facility] CARE GUIDE WILL CALL YOU ABOUT STRESS MANAGEMENT   MONITOR YOUR BLOOD PRESSURE TWICE A DAY WITH MACHINE PROVIDED. MAKE SURE YOU ARE LOGGED INTO YOUR VIVIFY APP WHILE CHECKING    DASH Eating Plan DASH stands for "Dietary Approaches to Stop Hypertension." The DASH eating plan is a healthy eating plan that has been shown to reduce high blood pressure (hypertension). It may also reduce your risk for type 2 diabetes, heart disease, and stroke. The DASH eating plan may also help with weight loss. What are tips for following this plan?  General guidelines Avoid eating more than 2,300 mg (milligrams) of salt (sodium) a day. If you have hypertension, you may need to reduce your sodium intake to 1,500 mg a day. Limit alcohol intake to no more than 1 drink a day for nonpregnant women and 2 drinks a day for men. One drink equals 12 oz of beer, 5 oz of wine, or 1 oz of hard liquor. Work with your health care provider to maintain a healthy body weight or to lose weight. Ask what an ideal weight is for you. Get at least 30 minutes of exercise that causes your heart to beat faster (aerobic  exercise) most days of the week. Activities may include walking, swimming, or biking. Work with your health care provider or diet and nutrition specialist (dietitian) to adjust your eating plan to your individual calorie needs. Reading food labels  Check food labels for the amount of sodium per serving. Choose foods with less than 5 percent of the Daily Value of sodium. Generally, foods with less than 300 mg of sodium per serving fit into this eating plan. To find whole grains, look for the word "whole" as the first word in the ingredient list. Shopping Buy products labeled as "low-sodium" or "no salt added." Buy fresh foods. Avoid canned foods and premade or frozen meals. Cooking Avoid adding salt when cooking. Use salt-free seasonings or herbs instead of table salt or sea salt. Check with your health care provider or pharmacist before using salt substitutes. Do not fry foods. Cook foods using healthy methods such as baking, boiling, grilling, and broiling instead. Cook with heart-healthy oils, such as olive, canola, soybean, or sunflower oil. Meal planning Eat a balanced diet that includes: 5 or more servings of fruits and vegetables each day. At each meal, try to fill half of your plate with fruits and vegetables. Up to 6-8 servings of whole grains each day. Less than 6 oz of lean meat, poultry, or fish each day. A 3-oz serving of meat is about the same size as a deck of cards. One egg equals 1 oz. 2 servings of  low-fat dairy each day. A serving of nuts, seeds, or beans 5 times each week. Heart-healthy fats. Healthy fats called Omega-3 fatty acids are found in foods such as flaxseeds and coldwater fish, like sardines, salmon, and mackerel. Limit how much you eat of the following: Canned or prepackaged foods. Food that is high in trans fat, such as fried foods. Food that is high in saturated fat, such as fatty meat. Sweets, desserts, sugary drinks, and other foods with added  sugar. Full-fat dairy products. Do not salt foods before eating. Try to eat at least 2 vegetarian meals each week. Eat more home-cooked food and less restaurant, buffet, and fast food. When eating at a restaurant, ask that your food be prepared with less salt or no salt, if possible. What foods are recommended? The items listed may not be a complete list. Talk with your dietitian about what dietary choices are best for you. Grains Whole-grain or whole-wheat bread. Whole-grain or whole-wheat pasta. Brown rice. [place] Morrow. Bulgur. Whole-grain and low-sodium cereals. Pita bread. Low-fat, low-sodium crackers. Whole-wheat flour tortillas. Vegetables Fresh or frozen vegetables (raw, steamed, roasted, or grilled). Low-sodium or reduced-sodium tomato and vegetable juice. Low-sodium or reduced-sodium tomato sauce and tomato paste. Low-sodium or reduced-sodium canned vegetables. Fruits All fresh, dried, or frozen fruit. Canned fruit in natural juice (without added sugar). Meat and other protein foods Skinless chicken or [place]. Ground chicken or [place]. Pork with fat trimmed off. Fish and seafood. Egg whites. Dried beans, peas, or lentils. Unsalted nuts, nut butters, and seeds. Unsalted canned beans. Lean cuts of beef with fat trimmed off. Low-sodium, lean deli meat. Dairy Low-fat (1%) or fat-free (skim) milk. Fat-free, low-fat, or reduced-fat cheeses. Nonfat, low-sodium ricotta or cottage cheese. Low-fat or nonfat yogurt. Low-fat, low-sodium cheese. Fats and oils Soft margarine without trans fats. Vegetable oil. Low-fat, reduced-fat, or light mayonnaise and salad dressings (reduced-sodium). Canola, safflower, olive, soybean, and sunflower oils. Avocado. Seasoning and other foods Herbs. Spices. Seasoning mixes without salt. Unsalted popcorn and pretzels. Fat-free sweets. What foods are not recommended? The items listed may not be a complete list. Talk with your dietitian about what dietary choices  are best for you. Grains Baked goods made with fat, such as croissants, muffins, or some breads. Dry pasta or rice meal packs. Vegetables Creamed or fried vegetables. Vegetables in a cheese sauce. Regular canned vegetables (not low-sodium or reduced-sodium). Regular canned tomato sauce and paste (not low-sodium or reduced-sodium). Regular tomato and vegetable juice (not low-sodium or reduced-sodium). Angie Fava. Olives. Fruits Canned fruit in a light or heavy syrup. Fried fruit. Fruit in cream or butter sauce. Meat and other protein foods Fatty cuts of meat. Ribs. Fried meat. Berniece Salines. Sausage. Bologna and other processed lunch meats. Salami. Fatback. Hotdogs. Bratwurst. Salted nuts and seeds. Canned beans with added salt. Canned or smoked fish. Whole eggs or egg yolks. Chicken or [place] with skin. Dairy Whole or 2% milk, cream, and half-and-half. Whole or full-fat cream cheese. Whole-fat or sweetened yogurt. Full-fat cheese. Nondairy creamers. Whipped toppings. Processed cheese and cheese spreads. Fats and oils Butter. Stick margarine. Lard. Shortening. Ghee. Bacon fat. Tropical oils, such as coconut, palm kernel, or palm oil. Seasoning and other foods Salted popcorn and pretzels. Onion salt, garlic salt, seasoned salt, table salt, and sea salt. Worcestershire sauce. Tartar sauce. Barbecue sauce. Teriyaki sauce. Soy sauce, including reduced-sodium. Steak sauce. Canned and packaged gravies. Fish sauce. Oyster sauce. Cocktail sauce. Horseradish that you find on the shelf. Ketchup. Mustard. Meat flavorings and tenderizers. Bouillon cubes.  Hot sauce and Tabasco sauce. Premade or packaged marinades. Premade or packaged taco seasonings. Relishes. Regular salad dressings. Where to find more information: National Heart, Lung, and [place]: https://wilson-eaton.com/ American Heart Association: www.heart.org Summary The DASH eating plan is a healthy eating plan that has been shown to reduce high blood pressure  (hypertension). It may also reduce your risk for type 2 diabetes, heart disease, and stroke. With the DASH eating plan, you should limit salt (sodium) intake to 2,300 mg a day. If you have hypertension, you may need to reduce your sodium intake to 1,500 mg a day. When on the DASH eating plan, aim to eat more fresh fruits and vegetables, whole grains, lean proteins, low-fat dairy, and heart-healthy fats. Work with your health care provider or diet and nutrition specialist (dietitian) to adjust your eating plan to your individual calorie needs. This information is not intended to replace advice given to you by your health care provider. Make sure you discuss any questions you have with your health care provider. Document Released: 11/03/2011 Document Revised: 10/27/2017 Document Reviewed: 11/07/2016 Elsevier Patient Education  2020 [employer].

## 2022-01-19 ENCOUNTER — Telehealth: Payer: Self-pay

## 2022-01-19 ENCOUNTER — Telehealth (HOSPITAL_BASED_OUTPATIENT_CLINIC_OR_DEPARTMENT_OTHER): Payer: Self-pay | Admitting: *Deleted

## 2022-01-19 DIAGNOSIS — Z0000 Encounter for general adult medical examination without abnormal findings: Secondary | ICD-10-CM

## 2022-01-19 NOTE — Telephone Encounter (Signed)
Called patient for 24-hour follow-up Vivify call and inquiry about health coaching per referral from Dr. Oval Linsey for stress management and per patient's survey response to not having an eating plan. Patient is interested in health coaching for both health goals. Patient has been scheduled for her initial health coaching session on 2/24 at 4:30pm. Patient will be called at this time.    Alanee Ting Truman Hayward, [facility] Guide, Health Coach [address] [place] [address] Telephone: [phone] Email: [email or web address]

## 2022-01-19 NOTE — Telephone Encounter (Signed)
Aricela L. Parlin from yesterday called this morning about her b/p. She is in group 2. Her b/p is 180/128. I gave her your office number to call you directly. Her only symptom is a headache per pt.   Above message received from [place] with research. I called and spoke with patient  Last night was "bad night" up frequently urinating and leg cramps Above blood pressure taken 15 minutes after taking am medications Reviewed medication changes from yesterday. She was confused on adding the HCTZ 12.5 mg until she ran out of current Telmisartan-HCT, she asked pharmacy to put back.  Explained medication changes to patient again  She will go get both the new dose of Telmisartan-HCT and HCTZ  She will take HCTZ today  Started Amlodipine as recommended  On recheck blood pressure was down to 147/109 and she was feeling better, no headache/eye pressure gone.  She will start recommended changes and continue to monitor  Discussed with Dr Oval Linsey and she agreed with plan

## 2022-01-21 ENCOUNTER — Telehealth: Payer: Self-pay | Admitting: *Deleted

## 2022-01-21 ENCOUNTER — Other Ambulatory Visit: Payer: Self-pay

## 2022-01-21 ENCOUNTER — Ambulatory Visit (HOSPITAL_BASED_OUTPATIENT_CLINIC_OR_DEPARTMENT_OTHER): Payer: PPO | Admitting: *Deleted

## 2022-01-21 ENCOUNTER — Ambulatory Visit: Payer: PPO

## 2022-01-21 ENCOUNTER — Telehealth (HOSPITAL_BASED_OUTPATIENT_CLINIC_OR_DEPARTMENT_OTHER): Payer: Self-pay | Admitting: *Deleted

## 2022-01-21 ENCOUNTER — Telehealth: Payer: Self-pay

## 2022-01-21 VITALS — BP 134/84 | HR 125 | Ht 67.0 in | Wt 185.0 lb

## 2022-01-21 DIAGNOSIS — R000 Tachycardia, unspecified: Secondary | ICD-10-CM

## 2022-01-21 DIAGNOSIS — Z0000 Encounter for general adult medical examination without abnormal findings: Secondary | ICD-10-CM

## 2022-01-21 DIAGNOSIS — I10 Essential (primary) hypertension: Secondary | ICD-10-CM | POA: Diagnosis not present

## 2022-01-21 DIAGNOSIS — R252 Cramp and spasm: Secondary | ICD-10-CM | POA: Diagnosis not present

## 2022-01-21 NOTE — Progress Notes (Signed)
Reason for visit: ELEVATED HEART RATE  Name of MD requesting visit: TIFFANY [place], MD  Patient complaining of having elevated heart rate and muscle cramping. She has been urinating more frequently since increasing the Telmisartan-HCT to 80-25 mg daily. EKG done, sinus tachycardia HR 123   Assessment and plan per MD: Discussed with Dr Duke Salvia, will have patient resume Metoprolol Succ 50 mg twice a day and resume Telimisartan-HCT 80-12.5 mg daily. BMET/MAGNESIUM TODAY.   Advised patient, verbalized understanding.

## 2022-01-21 NOTE — Patient Instructions (Signed)
Medication Instructions:  RESTART THE METOPROLOL   GO BACK TO THE TELMISARTAN-HCT 80-12.5 MG DAILY   Labwork: BMET/MAGNESIUM TODAY   KEEP FOLLOW UP AS SCHEDULED

## 2022-01-21 NOTE — Telephone Encounter (Addendum)
Spoke with patient I blood pressure 130/92 hr 133.  She did not sleep well for the last several nights secondary to increased urinating and cramping  Has been taking 6 ASA a day, advised to avoid  Per Dr Oval Linsey bring in for EKG today  Patient aware

## 2022-01-21 NOTE — Telephone Encounter (Signed)
Text message through the [facility] portal. Pt stated "I didn't do my exercises because with THESE MEDS I am so weak, I can barely walk to the kitchen. I cramp all night all over my legs severely. Even in my hands, my arms, even in my back muscles last night. Qaulity of life is bad right now. I shake really bad. That's why I haven't walked, or did my leg exercises. Sharmon Revere, thank you"   Dr Oval Linsey notified of this patient's issues via secure chat through [place].

## 2022-01-21 NOTE — Telephone Encounter (Signed)
Called patient as scheduled for health coaching session. Patient is not feeling well at this time and requested her appointment to be rescheduled to Monday, 2/27. Patient's initial health coaching session has been rescheduled to 2:15pm on 2/27. Patient will be called at this time.    Natalie Bond, [facility] Guide, Health Coach [address], [address] [place] [address] Telephone: [phone] Email: [email or web address]

## 2022-01-22 LAB — BASIC METABOLIC PANEL
BUN/Creatinine Ratio: 14 (ref 12–28)
BUN: 23 mg/dL (ref 8–27)
CO2: 22 mmol/L (ref 20–29)
Calcium: 10.4 mg/dL — ABNORMAL HIGH (ref 8.7–10.3)
Chloride: 98 mmol/L (ref 96–106)
Creatinine, Ser: 1.61 mg/dL — ABNORMAL HIGH (ref 0.57–1.00)
Glucose: 137 mg/dL — ABNORMAL HIGH (ref 70–99)
Potassium: 4.8 mmol/L (ref 3.5–5.2)
Sodium: 136 mmol/L (ref 134–144)
eGFR: 36 mL/min/{1.73_m2} — ABNORMAL LOW (ref >59)

## 2022-01-22 LAB — MAGNESIUM: Magnesium: 2.3 mg/dL (ref 1.6–2.3)

## 2022-01-24 ENCOUNTER — Telehealth: Payer: Self-pay | Admitting: Cardiovascular Disease

## 2022-01-24 ENCOUNTER — Telehealth: Payer: Self-pay

## 2022-01-24 ENCOUNTER — Ambulatory Visit: Payer: PPO

## 2022-01-24 DIAGNOSIS — Z0000 Encounter for general adult medical examination without abnormal findings: Secondary | ICD-10-CM

## 2022-01-24 NOTE — Telephone Encounter (Signed)
RN called patient to inform her that results not yet resulted by MD. Will update her when we have them!

## 2022-01-24 NOTE — Telephone Encounter (Signed)
Called patient to reschedule health coaching session due to being out of the office during her originally scheduled appointment. Patient has been rescheduled for 3/1 at 3:45pm. Patient will be called at this time.   Mushka Laconte Truman Hayward, [facility] Guide, Health Coach [address], [address] [place] [address] Telephone: [phone] Email: [email or web address]

## 2022-01-24 NOTE — Telephone Encounter (Signed)
° °  Pt is calling to f/u her lab result

## 2022-01-25 ENCOUNTER — Other Ambulatory Visit: Payer: Self-pay

## 2022-01-25 ENCOUNTER — Ambulatory Visit (INDEPENDENT_AMBULATORY_CARE_PROVIDER_SITE_OTHER): Payer: PPO

## 2022-01-25 DIAGNOSIS — G459 Transient cerebral ischemic attack, unspecified: Secondary | ICD-10-CM

## 2022-01-26 ENCOUNTER — Telehealth: Payer: Self-pay

## 2022-01-26 ENCOUNTER — Ambulatory Visit: Payer: PPO

## 2022-01-26 DIAGNOSIS — Z0000 Encounter for general adult medical examination without abnormal findings: Secondary | ICD-10-CM

## 2022-01-26 NOTE — Telephone Encounter (Signed)
Called the patient at scheduled time for health coaching session. Patient was not able to take the call at this time and requested to be rescheduled. Patient has been rescheduled for 3/3 at 3:45pm. Patient will be called at this time. ? ? ?Natalie Bond, [place] ?[facility] ?Care Guide, Health Coach ?[address] ?[place] [address] ?Telephone: [phone] ?Email: [name] ? ?

## 2022-01-27 ENCOUNTER — Telehealth: Payer: Self-pay | Admitting: Cardiovascular Disease

## 2022-01-27 ENCOUNTER — Encounter (HOSPITAL_BASED_OUTPATIENT_CLINIC_OR_DEPARTMENT_OTHER): Payer: Self-pay | Admitting: *Deleted

## 2022-01-27 DIAGNOSIS — E0590 Thyrotoxicosis, unspecified without thyrotoxic crisis or storm: Secondary | ICD-10-CM | POA: Diagnosis not present

## 2022-01-27 DIAGNOSIS — E785 Hyperlipidemia, unspecified: Secondary | ICD-10-CM | POA: Diagnosis not present

## 2022-01-27 DIAGNOSIS — E1140 Type 2 diabetes mellitus with diabetic neuropathy, unspecified: Secondary | ICD-10-CM | POA: Diagnosis not present

## 2022-01-27 DIAGNOSIS — I10 Essential (primary) hypertension: Secondary | ICD-10-CM | POA: Diagnosis not present

## 2022-01-27 NOTE — Telephone Encounter (Signed)
This encounter was created in error - please disregard.

## 2022-01-27 NOTE — Telephone Encounter (Signed)
Patient calling upset that she has not received her lab or vascular test results yet. She states she was told she would get a call Friday evening or Monday and does not understand why the results are not ready. She would like to know when she will be getting a call for the results, because she states she has been waiting almost a week now. ?

## 2022-01-27 NOTE — Telephone Encounter (Signed)
Please advise 

## 2022-01-28 ENCOUNTER — Telehealth: Payer: Self-pay

## 2022-01-28 ENCOUNTER — Ambulatory Visit: Payer: PPO

## 2022-01-28 DIAGNOSIS — Z0000 Encounter for general adult medical examination without abnormal findings: Secondary | ICD-10-CM

## 2022-01-28 MED ORDER — AMLODIPINE BESYLATE 10 MG PO TABS: 10.0000 mg | ORAL_TABLET | Freq: Every day | ORAL | 3 refills | Status: DC

## 2022-01-28 NOTE — Telephone Encounter (Signed)
Patient aware of results and medication change with increase of Amlodipine to 10 mg daily ?Patient had decreased her HCTZ back to 12.5 mg 2/25. Had repeat labs done at PCP yesterday, will call next week and request updated labs  ? ?After speaking with patient she thinks the Telmisartan-HCT causes her to be very tired and just have no energy  ?She also has had some tingling in her fingers and around her lips that only lasts for few minutes. Per [place] Mam NP  ?atypical for angioedema. Would continue to monitor when it happens (certain time, foods, etc) and follow up with primary care. if new or worsening shortness of breath contact us or seek emergency evaluation ? ?Advised patient  ?

## 2022-01-28 NOTE — Telephone Encounter (Signed)
-----   Message from Skeet Latch, MD sent at 01/28/2022  1:52 PM EST ----- ?Please disregard the prior note.  Blood pressure is much better controlled but her kidney function is worse.  Stop taking the extra 12.5 mg of hydrochlorothiazide.  Also recommend that she not take Lasix.  Increase amlodipine to 10 mg.  Keep tracking blood pressures in Vivify. ?

## 2022-01-28 NOTE — Telephone Encounter (Signed)
Called patient for initial health coaching session. Patient did not answer. Left message for patient to call back to reschedule.  ? ? ?Avelino Leeds, [place] ?[facility] ?Care Guide, Health Coach ?[address] ?[place] [address] ?Telephone: [phone] ?Email: [email or web address] ? ?

## 2022-02-04 DIAGNOSIS — R300 Dysuria: Secondary | ICD-10-CM | POA: Diagnosis not present

## 2022-02-04 DIAGNOSIS — N390 Urinary tract infection, site not specified: Secondary | ICD-10-CM | POA: Diagnosis not present

## 2022-02-04 DIAGNOSIS — G8929 Other chronic pain: Secondary | ICD-10-CM | POA: Diagnosis not present

## 2022-02-04 DIAGNOSIS — E1140 Type 2 diabetes mellitus with diabetic neuropathy, unspecified: Secondary | ICD-10-CM | POA: Diagnosis not present

## 2022-02-04 DIAGNOSIS — R319 Hematuria, unspecified: Secondary | ICD-10-CM | POA: Diagnosis not present

## 2022-02-08 ENCOUNTER — Ambulatory Visit
Admission: RE | Admit: 2022-02-08 | Discharge: 2022-02-08 | Disposition: A | Discharge status: Home / Self Care | Payer: PPO | Source: Ambulatory Visit | Attending: Internal Medicine | Admitting: Internal Medicine

## 2022-02-08 ENCOUNTER — Other Ambulatory Visit: Payer: Self-pay | Admitting: Internal Medicine

## 2022-02-08 DIAGNOSIS — R319 Hematuria, unspecified: Secondary | ICD-10-CM

## 2022-02-08 DIAGNOSIS — R102 Pelvic and perineal pain: Secondary | ICD-10-CM | POA: Diagnosis not present

## 2022-02-08 DIAGNOSIS — R109 Unspecified abdominal pain: Secondary | ICD-10-CM

## 2022-02-11 ENCOUNTER — Other Ambulatory Visit (HOSPITAL_COMMUNITY): Payer: Self-pay | Admitting: Internal Medicine

## 2022-02-11 ENCOUNTER — Other Ambulatory Visit: Payer: Self-pay

## 2022-02-11 ENCOUNTER — Other Ambulatory Visit: Payer: Self-pay | Admitting: Internal Medicine

## 2022-02-11 ENCOUNTER — Ambulatory Visit (HOSPITAL_BASED_OUTPATIENT_CLINIC_OR_DEPARTMENT_OTHER)
Admission: RE | Admit: 2022-02-11 | Discharge: 2022-02-11 | Disposition: A | Discharge status: Home / Self Care | Payer: PPO | Source: Ambulatory Visit | Attending: Internal Medicine | Admitting: Internal Medicine

## 2022-02-11 DIAGNOSIS — K760 Fatty (change of) liver, not elsewhere classified: Secondary | ICD-10-CM | POA: Insufficient documentation

## 2022-02-11 DIAGNOSIS — J984 Other disorders of lung: Secondary | ICD-10-CM | POA: Diagnosis not present

## 2022-02-11 DIAGNOSIS — R109 Unspecified abdominal pain: Secondary | ICD-10-CM | POA: Diagnosis not present

## 2022-02-11 DIAGNOSIS — Z9049 Acquired absence of other specified parts of digestive tract: Secondary | ICD-10-CM | POA: Insufficient documentation

## 2022-02-11 DIAGNOSIS — K838 Other specified diseases of biliary tract: Secondary | ICD-10-CM

## 2022-02-11 DIAGNOSIS — K7689 Other specified diseases of liver: Secondary | ICD-10-CM | POA: Diagnosis not present

## 2022-02-11 MED ORDER — GADOBUTROL 1 MMOL/ML IV SOLN
7.5000 mL | Freq: Once | INTRAVENOUS | Status: AC | PRN
  Administered 2022-02-11: 7.5 mL via INTRAVENOUS
  Filled 2022-02-11: qty 7.5

## 2022-02-16 ENCOUNTER — Ambulatory Visit (HOSPITAL_BASED_OUTPATIENT_CLINIC_OR_DEPARTMENT_OTHER): Payer: PPO | Admitting: Cardiovascular Disease

## 2022-02-17 DIAGNOSIS — I10 Essential (primary) hypertension: Secondary | ICD-10-CM | POA: Diagnosis not present

## 2022-02-24 DIAGNOSIS — R109 Unspecified abdominal pain: Secondary | ICD-10-CM | POA: Diagnosis not present

## 2022-02-24 DIAGNOSIS — Z800 Family history of malignant neoplasm of digestive organs: Secondary | ICD-10-CM | POA: Diagnosis not present

## 2022-02-24 DIAGNOSIS — K219 Gastro-esophageal reflux disease without esophagitis: Secondary | ICD-10-CM | POA: Diagnosis not present

## 2022-02-24 DIAGNOSIS — R197 Diarrhea, unspecified: Secondary | ICD-10-CM | POA: Diagnosis not present

## 2022-02-25 DIAGNOSIS — E785 Hyperlipidemia, unspecified: Secondary | ICD-10-CM | POA: Diagnosis not present

## 2022-02-25 DIAGNOSIS — E669 Obesity, unspecified: Secondary | ICD-10-CM | POA: Diagnosis not present

## 2022-02-25 DIAGNOSIS — Z0000 Encounter for general adult medical examination without abnormal findings: Secondary | ICD-10-CM | POA: Diagnosis not present

## 2022-02-25 DIAGNOSIS — G43909 Migraine, unspecified, not intractable, without status migrainosus: Secondary | ICD-10-CM | POA: Diagnosis not present

## 2022-02-25 DIAGNOSIS — I10 Essential (primary) hypertension: Secondary | ICD-10-CM | POA: Diagnosis not present

## 2022-02-25 DIAGNOSIS — E0590 Thyrotoxicosis, unspecified without thyrotoxic crisis or storm: Secondary | ICD-10-CM | POA: Diagnosis not present

## 2022-02-25 DIAGNOSIS — R1110 Vomiting, unspecified: Secondary | ICD-10-CM | POA: Diagnosis not present

## 2022-02-25 DIAGNOSIS — G4733 Obstructive sleep apnea (adult) (pediatric): Secondary | ICD-10-CM | POA: Diagnosis not present

## 2022-02-25 DIAGNOSIS — R739 Hyperglycemia, unspecified: Secondary | ICD-10-CM | POA: Diagnosis not present

## 2022-02-25 DIAGNOSIS — Z1331 Encounter for screening for depression: Secondary | ICD-10-CM | POA: Diagnosis not present

## 2022-02-25 DIAGNOSIS — M545 Low back pain, unspecified: Secondary | ICD-10-CM | POA: Diagnosis not present

## 2022-02-25 DIAGNOSIS — E1140 Type 2 diabetes mellitus with diabetic neuropathy, unspecified: Secondary | ICD-10-CM | POA: Diagnosis not present

## 2022-03-01 DIAGNOSIS — K589 Irritable bowel syndrome without diarrhea: Secondary | ICD-10-CM | POA: Diagnosis not present

## 2022-03-01 DIAGNOSIS — R42 Dizziness and giddiness: Secondary | ICD-10-CM | POA: Diagnosis not present

## 2022-03-01 DIAGNOSIS — E663 Overweight: Secondary | ICD-10-CM | POA: Diagnosis not present

## 2022-03-01 DIAGNOSIS — I10 Essential (primary) hypertension: Secondary | ICD-10-CM | POA: Diagnosis not present

## 2022-03-01 DIAGNOSIS — G43909 Migraine, unspecified, not intractable, without status migrainosus: Secondary | ICD-10-CM | POA: Diagnosis not present

## 2022-03-01 DIAGNOSIS — K219 Gastro-esophageal reflux disease without esophagitis: Secondary | ICD-10-CM | POA: Diagnosis not present

## 2022-03-01 DIAGNOSIS — M1990 Unspecified osteoarthritis, unspecified site: Secondary | ICD-10-CM | POA: Diagnosis not present

## 2022-03-01 DIAGNOSIS — M5136 Other intervertebral disc degeneration, lumbar region: Secondary | ICD-10-CM | POA: Diagnosis not present

## 2022-03-01 DIAGNOSIS — G8929 Other chronic pain: Secondary | ICD-10-CM | POA: Diagnosis not present

## 2022-03-01 DIAGNOSIS — E1142 Type 2 diabetes mellitus with diabetic polyneuropathy: Secondary | ICD-10-CM | POA: Diagnosis not present

## 2022-03-01 DIAGNOSIS — E785 Hyperlipidemia, unspecified: Secondary | ICD-10-CM | POA: Diagnosis not present

## 2022-03-01 DIAGNOSIS — E1169 Type 2 diabetes mellitus with other specified complication: Secondary | ICD-10-CM | POA: Diagnosis not present

## 2022-03-07 ENCOUNTER — Encounter (HOSPITAL_BASED_OUTPATIENT_CLINIC_OR_DEPARTMENT_OTHER): Payer: Self-pay | Admitting: Cardiovascular Disease

## 2022-03-17 ENCOUNTER — Telehealth: Payer: Self-pay

## 2022-03-17 ENCOUNTER — Ambulatory Visit (INDEPENDENT_AMBULATORY_CARE_PROVIDER_SITE_OTHER): Payer: PPO | Admitting: Pharmacist

## 2022-03-17 VITALS — BP 138/80 | HR 68 | Resp 14 | Ht 67.0 in | Wt 181.2 lb

## 2022-03-17 DIAGNOSIS — E0590 Thyrotoxicosis, unspecified without thyrotoxic crisis or storm: Secondary | ICD-10-CM | POA: Insufficient documentation

## 2022-03-17 DIAGNOSIS — E039 Hypothyroidism, unspecified: Secondary | ICD-10-CM | POA: Insufficient documentation

## 2022-03-17 DIAGNOSIS — R252 Cramp and spasm: Secondary | ICD-10-CM | POA: Insufficient documentation

## 2022-03-17 DIAGNOSIS — R232 Flushing: Secondary | ICD-10-CM

## 2022-03-17 DIAGNOSIS — K115 Sialolithiasis: Secondary | ICD-10-CM | POA: Insufficient documentation

## 2022-03-17 DIAGNOSIS — R1319 Other dysphagia: Secondary | ICD-10-CM | POA: Insufficient documentation

## 2022-03-17 DIAGNOSIS — M542 Cervicalgia: Secondary | ICD-10-CM | POA: Insufficient documentation

## 2022-03-17 DIAGNOSIS — I10 Essential (primary) hypertension: Secondary | ICD-10-CM

## 2022-03-17 DIAGNOSIS — R152 Fecal urgency: Secondary | ICD-10-CM | POA: Insufficient documentation

## 2022-03-17 DIAGNOSIS — Z0000 Encounter for general adult medical examination without abnormal findings: Secondary | ICD-10-CM

## 2022-03-17 DIAGNOSIS — R739 Hyperglycemia, unspecified: Secondary | ICD-10-CM | POA: Insufficient documentation

## 2022-03-17 DIAGNOSIS — K838 Other specified diseases of biliary tract: Secondary | ICD-10-CM | POA: Insufficient documentation

## 2022-03-17 MED ORDER — TELMISARTAN-HCTZ 80-12.5 MG PO TABS: 1.0000 | ORAL_TABLET | Freq: Every day | ORAL | 1 refills | Status: DC

## 2022-03-17 NOTE — Telephone Encounter (Signed)
CALLED PT, SHE IS HERE IN THE OFFICE TALKING TO THE PHARMACIST...NOTHING FURTHER NEEDED ?

## 2022-03-17 NOTE — Patient Instructions (Addendum)
It was nice meeting you today ? ?We would like your blood pressure to be less than 130/80 ? ?Please restart your telmisartan/hydrochlorothiazide 80/12.'5mg'$  once a day ? ?Continue your amlodipine '10mg'$  daily ?Continue your metoprolol '50mg'$  daily ? ?Try to continue the DASH diet as best you can ? ?If you would like [place] to place a referral for the counselor please let me know ? ?I placed a referral for you for women's health ? ?Karren Cobble, PharmD, BCACP, [place], CPP ?[address], [address] ?[place], [place], [address] ?Phone: [phone], Fax: [phone]  ? ?

## 2022-03-17 NOTE — Telephone Encounter (Signed)
-----   Message from Deberah Pelton, NP sent at 03/16/2022  3:33 PM EDT ----- ?Regarding: RE: Vivify - Medication Questions ?Sharyn Lull, ? ?Please contact patient and ask her what her questions are regarding medications.  Thank you for your help. ? ?Jossie Ng. Cleaver NP-C ? ?03/16/2022, 3:33 PM ?[place] ?[address] ?Office [phone] Fax [phone] ? ? ?----- Message ----- ?From: Avelino Leeds ?Sent: 03/16/2022   3:17 PM EDT ?To: Deberah Pelton, NP, Waylan Rocher, LPN ?Subject: Vivify - Medication Questions                 ? ?Hi team, ? ?This patient indicated that she has questions about her medications. ? ?Thanks, ?Amy ? ? ?

## 2022-03-17 NOTE — Progress Notes (Signed)
Patient ID: Natalie Bond                 DOB: 06-25-1956                      MRN: [id number]     HPI: Natalie Bond is a 66 y.o. female referred by Dr. Oval Linsey to HTN clinic. PMH is significant for HTN, migraines, TIA, OSA, and T2DM.  Patient seen by Dr Oval Linsey on 01/18/22  and enrolled in [place].  Patient presents today under stress. Reports her husband has melanoma and is undergoing testing.  Her daughter and granddaughter also have issues but she did not go into detail.  Patient tearful in office.  Believes personal stressors are one of the driving factors of her hypertension.  At first visit with Dr Oval Linsey patient's telmisartan/HCTZ was increased.  However Scr increased on BMP recheck and patient was instructed to reduce HCTZ.  Patient did not understand instructions and has not been taking any telmisartan/HCTZ and has only been taking amlodipine '10mg'$  and metoprolol '50mg'$  daily.  Is also concerned regarding pain and acetaminophen is not helping.  Was previously controlled on meloxicam but was instructed to discontinue.    Reports hot flashes that are interfering with her comfort at night. PCP placed her on gabapentin but this was ineffective.  Blood pressure has reduced since initial visit but still remains elevated.  Current HTN meds:  Telmisartan/HCTZ 80/12.'5mg'$  daily (not been taking) Amlodipine '10mg'$  daily Metoprolol succinate '50mg'$  daily  BP goal: <130/80  Wt Readings from Last 3 Encounters:  01/21/22 185 lb (83.9 kg)  01/18/22 185 lb 12.8 oz (84.3 kg)  03/10/20 178 lb (80.7 kg)   BP Readings from Last 3 Encounters:  01/21/22 134/84  01/18/22 (!) 196/90  12/19/21 (!) 161/100   Pulse Readings from Last 3 Encounters:  01/21/22 (!) 125  01/18/22 (!) 58  12/19/21 64    Renal function: CrCl cannot be calculated (Patient's most recent lab result is older than the maximum 21 days allowed.).  Past Medical History:  Diagnosis Date   Arthritis    Arthritis    back,  wrists   Cancer ([place])    skin cancer   Carpal tunnel syndrome    Complication of anesthesia    DDD (degenerative disc disease)    Diabetes mellitus without complication (HCC)    does not take meds   Dysrhythmia 1995   irreg hr   Full dentures    GERD (gastroesophageal reflux disease)    Headache    High cholesterol    History of bronchitis    History of kidney stones    2015   Hypertension    PONV (postoperative nausea and vomiting)    Sleep apnea    pt. does not use a CPAP or BiPAP   Stroke ([place])    Pt. states possible mini stroke in 2014, no deficits    Current Outpatient Medications on File Prior to Visit  Medication Sig Dispense Refill   Evolocumab (REPATHA SURECLICK) 803 MG/ML SOAJ take 140 mg     potassium chloride SA (KLOR-CON M) 20 MEQ tablet Take 1 tablet by mouth daily.     acetaminophen (TYLENOL) 325 MG tablet Take 325 mg by mouth as needed.     amLODipine (NORVASC) 10 MG tablet Take 1 tablet (10 mg total) by mouth daily. 90 tablet 3   aspirin 81 MG tablet Take 81 mg by mouth daily.  butalbital-acetaminophen-caffeine (FIORICET) 50-325-40 MG tablet Take 1 tablet by mouth every 12 (twelve) hours as needed for headache. 14 tablet 0   cyclobenzaprine (FLEXERIL) 10 MG tablet Take 10 mg by mouth 3 (three) times daily as needed.     furosemide (LASIX) 20 MG tablet Take 20 mg by mouth daily.     gabapentin (NEURONTIN) 100 MG capsule Take 100 mg by mouth as needed.     hydrochlorothiazide (HYDRODIURIL) 12.5 MG tablet Take 12.5 mg by mouth daily.     HYDROcodone-acetaminophen (NORCO) 5-325 MG tablet Take 1-2 tablets by mouth 2 (two) times daily as needed for moderate pain. 20 tablet 0   magnesium 30 MG tablet Take 30 mg by mouth 2 (two) times daily.     meclizine (ANTIVERT) 25 MG tablet Take 25 mg by mouth as needed.     meloxicam (MOBIC) 15 MG tablet Take 15 mg by mouth daily as needed.     methimazole (TAPAZOLE) 5 MG tablet Take 5 mg by mouth daily.     metoprolol  succinate (TOPROL-XL) 50 MG 24 hr tablet Take 50 mg by mouth in the morning and at bedtime. Take with or immediately following a meal.     ondansetron (ZOFRAN) 4 MG tablet Take 1-2 tablets (4-8 mg total) by mouth every 8 (eight) hours as needed for nausea or vomiting. 20 tablet 0   pantoprazole (PROTONIX) 40 MG tablet Take 40 mg by mouth daily.     Potassium 75 MG TABS Take 75 mg by mouth daily.      telmisartan-hydrochlorothiazide (MICARDIS HCT) 80-12.5 MG tablet Take 1 tablet by mouth daily.     topiramate (TOPAMAX) 100 MG tablet Take 100 mg by mouth at bedtime.     Current Facility-Administered Medications on File Prior to Visit  Medication Dose Route Frequency Provider Last Rate Last Admin   methylPREDNISolone acetate (DEPO-MEDROL) injection 80 mg  80 mg Other Once Magnus Sinning, MD        Allergies  Allergen Reactions   Dilaudid [Hydromorphone Hcl] Anaphylaxis   Dilaudid [Hydromorphone Hcl] Anaphylaxis    [name]    Penicillins Anaphylaxis and Rash    Has patient had a PCN reaction causing immediate rash, facial/tongue/throat swelling, SOB or lightheadedness with hypotension: Yes Has patient had a PCN reaction causing severe rash involving mucus membranes or skin necrosis: No Has patient had a PCN reaction that required hospitalization Yes Has patient had a PCN reaction occurring within the last 10 years: No If all of the above answers are "NO", then may proceed with Cephalosporin use.    Penicillins Hives     Assessment/Plan:  1. Hypertension -  Patient BP in room 138/80 which is above goal of <130/80. Stress and pain likely contibuting factors also patient did not restart the reduced dose of telmisartan/HCTZ because she misunderstood the directions.  Will have patient restart at this time.  Patient tearful and stressed regarding health of family members. Suggested referral for counseling but she declines at this time.  Will also place referral to GYN  for help with post menopausal symptom management. Patient appreciative for help.  Already has follow up with Dr Oval Linsey scheduled for June.  Restart telmisartan/HCTZ 80/12.'5mg'$  daily Continue metoprolol '100mg'$  daily Continue amlodipine '10mg'$  daily Recheck with Dr Oval Linsey in 2 months  Karren Cobble, PharmD, [place], [place], [address], [place], [place], [address] Phone: [phone], Fax: [phone]

## 2022-03-18 ENCOUNTER — Encounter: Payer: Self-pay | Admitting: Pharmacist

## 2022-03-19 DIAGNOSIS — I10 Essential (primary) hypertension: Secondary | ICD-10-CM | POA: Diagnosis not present

## 2022-03-21 NOTE — Telephone Encounter (Signed)
Patient was seen by Threasa Beards D 4/20 ?

## 2022-03-25 DIAGNOSIS — K219 Gastro-esophageal reflux disease without esophagitis: Secondary | ICD-10-CM | POA: Diagnosis not present

## 2022-03-25 DIAGNOSIS — Z1211 Encounter for screening for malignant neoplasm of colon: Secondary | ICD-10-CM | POA: Diagnosis not present

## 2022-03-25 DIAGNOSIS — K589 Irritable bowel syndrome without diarrhea: Secondary | ICD-10-CM | POA: Diagnosis not present

## 2022-03-25 DIAGNOSIS — Z800 Family history of malignant neoplasm of digestive organs: Secondary | ICD-10-CM | POA: Diagnosis not present

## 2022-04-07 ENCOUNTER — Telehealth: Payer: Self-pay | Admitting: Pharmacist Clinician (PhC)/ Clinical Pharmacy Specialist

## 2022-04-07 MED ORDER — TELMISARTAN 80 MG PO TABS: 80.0000 mg | ORAL_TABLET | Freq: Every day | ORAL | 3 refills | Status: DC

## 2022-04-07 NOTE — Telephone Encounter (Signed)
-----   Message from Avelino Leeds sent at 04/07/2022  4:51 PM EDT ----- ?Regarding: Medication Side Effects ?Hi Natalie Bond, ? ?This patient sent a message in [place] stating the following: ? ?Taking the fluid pill with bp makes me have really bad cramps at night and I can't rest when that happens. That's usually when my bp goes up too. Because they are severe cramps. The medicine doesn't even help most times. ? ?Could you reach out to her to assist with her medication issues? ? ? ?Thanks, ?Amy ? ?

## 2022-04-07 NOTE — Telephone Encounter (Signed)
Returned call to patient.  She notes that her legs are cramping badly, mostly at night and she is unable to sleep.  Started after HCTZ was added.   She has been drinking Gatorade to help with this, but not getting much relief. ? ?Asked patient to stop Gatorade due to high sodium content, and stick with water.  Will D/C telmisartan hctz and go back to telmisartan 80 mg.   Pt will continue to monitor BP in [place] ?

## 2022-04-15 ENCOUNTER — Telehealth: Payer: Self-pay | Admitting: Cardiovascular Disease

## 2022-04-15 DIAGNOSIS — I10 Essential (primary) hypertension: Secondary | ICD-10-CM

## 2022-04-15 DIAGNOSIS — Z5181 Encounter for therapeutic drug level monitoring: Secondary | ICD-10-CM

## 2022-04-15 DIAGNOSIS — M1990 Unspecified osteoarthritis, unspecified site: Secondary | ICD-10-CM

## 2022-04-15 NOTE — Telephone Encounter (Signed)
Patient sent a message through [place] about swelling.  This is after she stopped taking HCTZ for cramps.  Please have her try adding spironolactone 12.'5mg'$ .  Check a BMP in a week.  Metztli Sachdev C. Oval Linsey, MD, [facility] 04/15/2022 8:10 AM

## 2022-04-21 NOTE — Telephone Encounter (Signed)
Left message to call back  

## 2022-04-26 MED ORDER — SPIRONOLACTONE 25 MG PO TABS: 12.5000 mg | ORAL_TABLET | Freq: Every day | ORAL | 3 refills | Status: DC

## 2022-04-26 NOTE — Telephone Encounter (Signed)
Advised patient, verbalized understanding   Stated she has not been using Mobic for her RA secondary to it causing elevated blood pressure, uses Hydrocodone   Asked for referral to Rheumatologist, referral placed

## 2022-04-26 NOTE — Telephone Encounter (Signed)
Pt returning call in regards to upcoming procedure.

## 2022-04-26 NOTE — Addendum Note (Signed)
Addended by: Alvina Filbert B on: 04/26/2022 01:30 PM   Modules accepted: Orders

## 2022-04-28 ENCOUNTER — Telehealth (HOSPITAL_BASED_OUTPATIENT_CLINIC_OR_DEPARTMENT_OTHER): Payer: Self-pay | Admitting: *Deleted

## 2022-04-28 NOTE — Telephone Encounter (Signed)
Had spoken with patient regarding her medications and recent labs at PCP  Dr Oval Linsey reviewed labs and wanted patient to stop her Potassium, continue Spironolactone and check labs at follow   When called to review Dr Blenda Mounts recommendations patient stated she was not taking Spironolactone (never picked up)  and could never stop Potassium because of leg cramps Advised to bring all medications to follow up 6/13, verbalized understanding

## 2022-05-09 ENCOUNTER — Ambulatory Visit (HOSPITAL_BASED_OUTPATIENT_CLINIC_OR_DEPARTMENT_OTHER): Payer: PPO | Admitting: Cardiovascular Disease

## 2022-05-09 NOTE — Progress Notes (Signed)
Advanced Hypertension Clinic Follow-up:    Date:  05/10/2022   ID:  Natalie Bond, Natalie Bond Sep 27, 1956, MRN [id number]  PCP:  Donnajean Lopes, MD  Cardiologist:  None  Nephrologist:  Referring MD: Donnajean Lopes, MD   CC: Hypertension  History of Present Illness:    Natalie Bond is a 66 y.o. female with a hx of hypertension, hyperlipidemia, diabetes, GERD, sleep apnea not on CPAP, skin cancer, nephrolithiasis, and arthritis, here for follow-up. She was initially seen 01/18/2022 in the [facility] Clinic. [name] presented to the ED on 12/19/21 with headaches. Her blood pressure was 161/100. At the time she was on clonidine, metoprolol, prednisone, telmisartan/HCTZ, ibuprofen and meloxicam.  At her last appointment, she reported having hypertension since she was 66 yo. She was struggling with multiple life stressors, and needed to take 3 extra tablets of clonidine daily. She had a small systemic stroke about 2 years prior. Clonidine was stopped. Her metoprolol was switched to 5 mg amlodipine. HCTZ was increased to 25 mg. She was enrolled in the RPM study through [place]. She followed up with our pharmacist 03/17/2022, however there was a misunderstanding and she had not been taking telmisartan/HCTZ. She was under a lot of family stress at the time. telmisartan/HCTZ  80/12.5 was restarted. She had severe leg cramps and HCTZ was discontinued. She had swelling and was started on spironolactone. However, she never picked it up and has just been taking potassium.  Today, she reports worsening LE edema in the past month (L>R). By the end of the day this causes pain in her left leg. 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 She is also suffering from LE muscle cramps and does not sleep well. Previously she has passed out from the pain due to her muscle cramps. Additionally she complains of worsening arthritic pain since she stopped taking Meloxicam. Three days ago she  went back on Meloxicam as she could no longer tolerate the pain. Since then she has noticed her blood pressure increasing again. Of note, she is also suffering from recent onset dysuria. Her voice is hoarse, which she attributes to acid reflux. She continues to have hot flashes, but unfortunately is not able to see an OB/Gyn until September. She denies any palpitations, chest pain, or shortness of breath. No lightheadedness, headaches, syncope, orthopnea, or PND.  Previous antihypertensives: N/a  Past Medical History:  Diagnosis Date   Arthritis    Arthritis    back, wrists   Cancer ([place])    skin cancer   Carpal tunnel syndrome    Complication of anesthesia    DDD (degenerative disc disease)    Diabetes mellitus without complication (HCC)    does not take meds   Dysrhythmia 1995   irreg hr   Full dentures    GERD (gastroesophageal reflux disease)    Headache    High cholesterol    History of bronchitis    History of kidney stones    2015   Hypertension    PONV (postoperative nausea and vomiting)    Sleep apnea    pt. does not use a CPAP or BiPAP   Stroke ([place])    Pt. states possible mini stroke in 2014, no deficits    Past Surgical History:  Procedure Laterality Date   ABDOMINAL HYSTERECTOMY     BACK SURGERY  2004   [id number]   BACK SURGERY     CARDIAC CATHETERIZATION     01/27/15 (  [facility]): EF 60%, normal coronaries.   CARPAL TUNNEL RELEASE  09/20/2012   Procedure: CARPAL TUNNEL RELEASE;  Surgeon: Cammie Sickle., MD;  Location: [facility];  Service: Orthopedics;  Laterality: Left;   CARPOMETACARPEL SUSPENSION PLASTY Right 12/12/2018   Procedure: RIGHT THUMB CARPOMETACARPAL [facility]) ARTHROPLASTY;  Surgeon: Leandrew Koyanagi, MD;  Location: [facility];  Service: Orthopedics;  Laterality: Right;   CARPOMETACARPEL SUSPENSION PLASTY Left 12/30/2019   Procedure: LEFT THUMB CARPOMETACARPAL ([place]) ARTHROPLASTY;  Surgeon: Leandrew Koyanagi, MD;  Location: [place];  Service: Orthopedics;  Laterality: Left;   CATARACT EXTRACTION, BILATERAL     CHOLECYSTECTOMY  07/2016   done [place]   DORSAL COMPARTMENT RELEASE  09/20/2012   Procedure: RELEASE DORSAL COMPARTMENT ([place]);  Surgeon: Cammie Sickle., MD;  Location[facility];  Service: Orthopedics;  Laterality: Left;   EYE SURGERY     bilat cataracts   EYE SURGERY     HERNIA REPAIR Left 1990   left groin    LOWER EXTREMITY ANGIOGRAM Left 06/08/2015   Procedure: Ascending Venogram  Iliac Vein ;  Surgeon: Elam Dutch, MD;  Location[facility];  Service: Vascular;  Laterality: Left;  ;  TARA- [facility] / [place] HAVE BEEN NOTIFIED/ CONFIRMED   PARTIAL HYSTERECTOMY     SHOULDER SURGERY     TOTAL HIP ARTHROPLASTY Left 10/21/2016   Procedure: LEFT TOTAL HIP ARTHROPLASTY ANTERIOR APPROACH;  Surgeon: Mcarthur Rossetti, MD;  Location: [facility];  Service: Orthopedics;  Laterality: Left;   WRIST SURGERY Bilateral    carpal tunnel    Current Medications: Current Meds  Medication Sig   acetaminophen (TYLENOL) 325 MG tablet Take 325 mg by mouth as needed.   butalbital-acetaminophen-caffeine (FIORICET) 50-325-40 MG tablet Take 1 tablet by mouth every 12 (twelve) hours as needed for headache.   cyclobenzaprine (FLEXERIL) 10 MG tablet Take 10 mg by mouth 3 (three) times daily as needed.   dicyclomine (BENTYL) 20 MG tablet Take 20 mg by mouth every 6 (six) hours.   Evolocumab (REPATHA SURECLICK) 704 MG/ML SOAJ take 140 mg   gabapentin (NEURONTIN) 100 MG capsule Take 100 mg by mouth as needed.   HYDROcodone-acetaminophen (NORCO) 5-325 MG tablet Take 1-2 tablets by mouth 2 (two) times daily as needed for moderate pain.   magnesium 30 MG tablet Take 30 mg by mouth 2 (two) times daily.   meclizine (ANTIVERT) 25 MG tablet Take 25 mg by mouth as needed.   meloxicam (MOBIC) 15 MG tablet Take 15 mg by mouth daily as needed.   methimazole (TAPAZOLE) 5 MG  tablet Take 5 mg by mouth daily.   metoprolol succinate (TOPROL-XL) 50 MG 24 hr tablet Take 50 mg by mouth daily. Take with or immediately following a meal.   ondansetron (ZOFRAN) 4 MG tablet Take 1-2 tablets (4-8 mg total) by mouth every 8 (eight) hours as needed for nausea or vomiting.   pantoprazole (PROTONIX) 40 MG tablet Take 40 mg by mouth daily.   topiramate (TOPAMAX) 100 MG tablet Take 100 mg by mouth at bedtime.   [DISCONTINUED] amLODipine (NORVASC) 10 MG tablet Take 1 tablet (10 mg total) by mouth daily.   [DISCONTINUED] furosemide (LASIX) 20 MG tablet Take 20 mg by mouth daily.   [DISCONTINUED] gabapentin (NEURONTIN) 300 MG capsule Take 300 mg by mouth daily.   [DISCONTINUED] hydrochlorothiazide (MICROZIDE) 12.5 MG capsule Take 12.5 mg by mouth daily.   [DISCONTINUED] metoprolol succinate (  TOPROL-XL) 100 MG 24 hr tablet Take 100 mg by mouth daily. Take with or immediately following a meal.   [DISCONTINUED] potassium chloride SA (KLOR-CON M) 20 MEQ tablet Take 1 tablet by mouth daily.   [DISCONTINUED] spironolactone (ALDACTONE) 25 MG tablet Take 0.5 tablets (12.5 mg total) by mouth daily.   [DISCONTINUED] telmisartan (MICARDIS) 80 MG tablet Take 1 tablet (80 mg total) by mouth daily.   [DISCONTINUED] telmisartan-hydrochlorothiazide (MICARDIS HCT) 80-25 MG tablet Take 1 tablet by mouth daily.   Current Facility-Administered Medications for the 05/10/22 encounter (Office Visit) with Skeet Latch, MD  Medication   methylPREDNISolone acetate (DEPO-MEDROL) injection 80 mg     Allergies:   Dilaudid [hydromorphone hcl], Dilaudid [hydromorphone hcl], Penicillins, Atorvastatin, Carvedilol, Gemfibrozil, Lisinopril, Metformin hcl, Mobic [meloxicam], Penicillin g sodium, Penicillins, and Valsartan   Social History   Socioeconomic History   Marital status: Married    Spouse name: Animator   Number of children: 2   Years of education: college   Highest education level: Not on file   Occupational History   Occupation: [employer] Therapist, art: [employer]  Tobacco Use   Smoking status: Never   Smokeless tobacco: Never  Substance and Sexual Activity   Alcohol use: No   Drug use: No   Sexual activity: Not on file  Other Topics Concern   Not on file  Social History Narrative   ** Merged History Encounter **       Social Determinants of Health   Financial Resource Strain: Low Risk  (01/18/2022)   Overall Financial Resource Strain (CARDIA)    Difficulty of Paying Living Expenses: Not hard at all  Food Insecurity: No Food Insecurity (01/18/2022)   Hunger Vital Sign    Worried About Running Out of Food in the Last Year: Never true    Ran Out of Food in the Last Year: Never true  Transportation Needs: No Transportation Needs (01/18/2022)   PRAPARE - Hydrologist (Medical): No    Lack of Transportation (Non-Medical): No  Physical Activity: Insufficiently Active (01/18/2022)   Exercise Vital Sign    Days of Exercise per Week: 3 days    Minutes of Exercise per Session: 20 min  Stress: Not on file  Social Connections: Not on file     Family History: The patient's family history includes Cancer in her brother, brother, father, mother, and sister; Diabetes in her brother, father, and mother; Heart disease in her father, mother, and sister; Hyperlipidemia in her brother, father, mother, and sister; Hypertension in her brother, father, mother, and sister; Varicose Veins in her father.  ROS:   Please see the history of present illness.    (+) Bilateral LE edema (L>R), muscle cramps (+) LLE pain (+) Arthralgias (+) Dysuria (+) Hot flashes All other systems reviewed and are negative.  EKGs/Labs/Other Studies Reviewed:    Bilateral Carotid Doppler  01/25/2022: Summary:  Right Carotid: The extracranial vessels were near-normal with only minimal wall thickening or plaque.   Left Carotid: The extracranial vessels were near-normal with  only minimal  wall thickening or plaque.   Vertebrals:  Bilateral vertebral arteries demonstrate antegrade flow.  Subclavians: Normal flow hemodynamics were seen in bilateral subclavian arteries.   Echo 05/28/2019:  1. The left ventricle has normal systolic function with an ejection  fraction of 60-65%. The cavity size was normal. Left ventricular diastolic  Doppler parameters are consistent with impaired relaxation. No evidence of left ventricular regional wall motion  abnormalities.   2. The mitral valve is grossly normal.   3. The tricuspid valve is grossly normal.   4. The aortic valve is tricuspid. Mild sclerosis of the aortic valve. No  stenosis of the aortic valve.   SUMMARY     LVEF 60-65%, normal wall thickness, normal wall motion, grade 1 DD,  normal LV filling pressure, normal LA size, mild aortic valve  sclerosis, normal IVC   EKG:  EKG is personally reviewed. 05/10/2022:  EKG was not ordered. 01/18/2022: EKG was not ordered.   Recent Labs: 12/19/2021: ALT 14; Hemoglobin 15.3; Platelets 296; TSH 0.473 01/21/2022: BUN 23; Creatinine, Ser 1.61; Magnesium 2.3; Potassium 4.8; Sodium 136   Recent Lipid Panel    Component Value Date/Time   CHOL 217 (H) 05/28/2019 0113   TRIG 383 (H) 05/28/2019 0113   HDL 42 05/28/2019 0113   CHOLHDL 5.2 05/28/2019 0113   VLDL 77 (H) 05/28/2019 0113   LDLCALC 98 05/28/2019 0113    Physical Exam:    VS:  BP 118/62 (BP Location: Left Arm, Patient Position: Sitting, Cuff Size: Normal)   Pulse 62   Ht '5\' 7"'  (1.702 m)   Wt 178 lb 3.2 oz (80.8 kg)   SpO2 97%   BMI 27.91 kg/m  , BMI Body mass index is 27.91 kg/m. GENERAL:  Well appearing HEENT: Pupils equal round and reactive, fundi not visualized, oral mucosa unremarkable NECK:  No jugular venous distention, waveform within normal limits, carotid upstroke brisk and symmetric, no bruits, no thyromegaly LUNGS:  Clear to auscultation bilaterally HEART:  RRR.  PMI not displaced or  sustained,S1 and S2 within normal limits, no S3, no S4, no clicks, no rubs, no murmurs ABD:  Flat, positive bowel sounds normal in frequency in pitch, no bruits, no rebound, no guarding, no midline pulsatile mass, no hepatomegaly, no splenomegaly EXT:  2 plus pulses throughout, L>R non-pitting edema.  No cyanosis no clubbing SKIN:  No rashes no nodules NEURO:  Cranial nerves II through XII grossly intact, motor grossly intact throughout PSYCH:  Cognitively intact, oriented to person place and time   ASSESSMENT/PLAN:    Essential hypertension Her blood pressure has been very well controlled lately.  However she has significant right lower extremity nonpitting edema.  I suspect this is due to the higher dose of amlodipine.  We will have her go back to 5 mg daily.  There is also been confusion about her telmisartan/HCTZ.  She was taking 80/25 and 80/12.5 mg daily.  She reports back pain and burning in her kidneys.  I am afraid she might have some AKI.  We will have her stop both doses of telmisartan/HCTZ.  She will also stop potassium.  We will start her on spironolactone 25 mg daily.  Repeat a basic metabolic panel in a week.  Given her lower extremity edema we will check a BNP, though I do suspect it is due to venous insufficiency and the medication and not heart failure.  She we will stop in the remote patient monitoring study today and turned in her device.  She was asked to keep checking her blood pressures twice daily at home.  Continue metoprolol.  We will try to get her to stop meloxicam but she had to resume it due to severe pain.  She has pain in her hands and reports having rheumatoid arthritis.  She was referred to rheumatology but they declined seeing her.  Check an ESR and ANA today.  Obstructive sleep apnea (adult) (  pediatric) She does not tolerate CPAP.  Stroke (cerebrum) ([place]) Blood pressure is well controlled.  Making changes as above.  Continue aspirin and Repatha.    Screening  for Secondary Hypertension:     01/18/2022    9:46 AM  Causes  Drugs/Herbals Screened     - Comments clonidine, limited caffeine.  No EtOH.  Meloxicam 3/week.  Renovascular HTN Screened     - Comments Check renal artery Dopplers  Sleep Apnea Screened     - Comments No symptoms  Thyroid Disease Screened  Hyperaldosteronism Not Screened     - Comments Currently on telmisartan  Pheochromocytoma Screened     - Comments She will come back for catecholamines and metanephrines, especially given her frequent hot flashes  Cushing's Syndrome N/A  Hyperparathyroidism Screened  Coarctation of the Aorta Screened     - Comments BP symmetric  Compliance Screened    Relevant Labs/Studies:    Latest Ref Rng & Units 01/21/2022    3:42 PM 12/19/2021    9:15 AM 12/19/2021    8:56 AM  Basic Labs  Sodium 134 - 144 mmol/L 136  138  136   Potassium 3.5 - 5.2 mmol/L 4.8  3.8  3.8   Creatinine 0.57 - 1.00 mg/dL 1.61  1.10  1.13        Latest Ref Rng & Units 12/19/2021    8:56 AM  Thyroid   TSH 0.350 - 4.500 uIU/mL 0.473     Disposition:    FU with APP/PharmD in 1-2 months.   Medication Adjustments/Labs and Tests Ordered: Current medicines are reviewed at length with the patient today.  Concerns regarding medicines are outlined above.   Orders Placed This Encounter  Procedures   ANA   Sedimentation rate   B Nat Peptide   Basic Metabolic Panel (BMET)   Urinalysis   Meds ordered this encounter  Medications   amLODipine (NORVASC) 5 MG tablet    Sig: Take 1 tablet (5 mg total) by mouth daily.    Dispense:  90 tablet    Refill:  3    New dose, d/c 10 mg RX   spironolactone (ALDACTONE) 25 MG tablet    Sig: Take 1 tablet (25 mg total) by mouth daily.    Dispense:  90 tablet    Refill:  3    D/C 12.5 MG RX AND POTASSIUM   I,Mathew Stumpf,acting as a scribe for Skeet Latch, MD.,have documented all relevant documentation on the behalf of Skeet Latch, MD,as directed by  Skeet Latch, MD while in the presence of Skeet Latch, MD.  I, [place] Oval Linsey, MD have reviewed all documentation for this visit.  The documentation of the exam, diagnosis, procedures, and orders on 05/10/2022 are all accurate and complete.   Signed, Skeet Latch, MD  05/10/2022 5:14 PM    [facility]

## 2022-05-10 ENCOUNTER — Encounter (HOSPITAL_BASED_OUTPATIENT_CLINIC_OR_DEPARTMENT_OTHER): Payer: Self-pay | Admitting: Cardiovascular Disease

## 2022-05-10 ENCOUNTER — Ambulatory Visit (HOSPITAL_BASED_OUTPATIENT_CLINIC_OR_DEPARTMENT_OTHER): Payer: PPO | Admitting: Cardiovascular Disease

## 2022-05-10 VITALS — BP 118/62 | HR 62 | Ht 67.0 in | Wt 178.2 lb

## 2022-05-10 DIAGNOSIS — M2559 Pain in other specified joint: Secondary | ICD-10-CM

## 2022-05-10 DIAGNOSIS — G4733 Obstructive sleep apnea (adult) (pediatric): Secondary | ICD-10-CM

## 2022-05-10 DIAGNOSIS — I639 Cerebral infarction, unspecified: Secondary | ICD-10-CM

## 2022-05-10 DIAGNOSIS — R300 Dysuria: Secondary | ICD-10-CM

## 2022-05-10 DIAGNOSIS — I10 Essential (primary) hypertension: Secondary | ICD-10-CM

## 2022-05-10 DIAGNOSIS — M7989 Other specified soft tissue disorders: Secondary | ICD-10-CM

## 2022-05-10 MED ORDER — SPIRONOLACTONE 25 MG PO TABS: 25.0000 mg | ORAL_TABLET | Freq: Every day | ORAL | 3 refills | Status: DC

## 2022-05-10 MED ORDER — AMLODIPINE BESYLATE 5 MG PO TABS: 5.0000 mg | ORAL_TABLET | Freq: Every day | ORAL | 3 refills | Status: DC

## 2022-05-10 NOTE — Assessment & Plan Note (Addendum)
Her blood pressure has been very well controlled lately.  However she has significant right lower extremity nonpitting edema.  I suspect this is due to the higher dose of amlodipine.  We will have her go back to 5 mg daily.  There is also been confusion about her telmisartan/HCTZ.  She was taking 80/25 and 80/12.5 mg daily.  She reports back pain and burning in her kidneys.  I am afraid she might have some AKI.  We will have her stop both doses of telmisartan/HCTZ.  She will also stop potassium.  We will start her on spironolactone 25 mg daily.  Repeat a basic metabolic panel in a week.  Given her lower extremity edema we will check a BNP, though I do suspect it is due to venous insufficiency and the medication and not heart failure.  She we will stop in the remote patient monitoring study today and turned in her device.  She was asked to keep checking her blood pressures twice daily at home.  Continue metoprolol.  We will try to get her to stop meloxicam but she had to resume it due to severe pain.  She has pain in her hands and reports having rheumatoid arthritis.  She was referred to rheumatology but they declined seeing her.  Check an ESR and ANA today.

## 2022-05-10 NOTE — Assessment & Plan Note (Signed)
Blood pressure is well controlled.  Making changes as above.  Continue aspirin and Repatha.

## 2022-05-10 NOTE — Patient Instructions (Addendum)
Medication Instructions:  STOP ALL THE TELMISARTAN YOU ARE TAKING  START SPIRONOLACTONE 25 MG DAILY   DECREASE YOUR AMLODIPINE TO 5 MG DAILY   STOP POTASSIUM   STOP HYDROCHLOROTHIAZIDE   Labwork: BMET/BNP/ESR/ANA/URINALYSIS TODAY   BMET IN 1 WEEK   Testing/Procedures: NONE   Follow-Up: 07/20/2022 8:00 AM WITH PHARM D   Any Other Special Instructions Will Be Listed Below (If Applicable). CONTINUE TO MONITOR YOUR BLOOD PRESSURE TWICE A DAY BRING READINGS AND MACHINE TO FOLLOW UP

## 2022-05-10 NOTE — Assessment & Plan Note (Signed)
She does not tolerate CPAP.

## 2022-05-11 LAB — BASIC METABOLIC PANEL
BUN/Creatinine Ratio: 15 (ref 12–28)
BUN: 27 mg/dL (ref 8–27)
CO2: 22 mmol/L (ref 20–29)
Calcium: 9.6 mg/dL (ref 8.7–10.3)
Chloride: 100 mmol/L (ref 96–106)
Creatinine, Ser: 1.85 mg/dL — ABNORMAL HIGH (ref 0.57–1.00)
Glucose: 91 mg/dL (ref 70–99)
Potassium: 3.8 mmol/L (ref 3.5–5.2)
Sodium: 140 mmol/L (ref 134–144)
eGFR: 30 mL/min/{1.73_m2} — ABNORMAL LOW (ref >59)

## 2022-05-11 LAB — BRAIN NATRIURETIC PEPTIDE: BNP: 35.6 pg/mL (ref 0.0–100.0)

## 2022-05-11 LAB — SEDIMENTATION RATE: Sed Rate: 2 mm/hr (ref 0–40)

## 2022-05-11 LAB — ANA: Anti Nuclear Antibody (ANA): NEGATIVE

## 2022-05-11 LAB — URINALYSIS
Bilirubin, UA: NEGATIVE
Glucose, UA: NEGATIVE
Ketones, UA: NEGATIVE
Nitrite, UA: NEGATIVE
Protein,UA: NEGATIVE
RBC, UA: NEGATIVE
Specific Gravity, UA: 1.021 (ref 1.005–1.030)
Urobilinogen, Ur: 0.2 mg/dL (ref 0.2–1.0)
pH, UA: 5.5 (ref 5.0–7.5)

## 2022-05-11 NOTE — Telephone Encounter (Signed)
Patient seen in office 6/13

## 2022-06-07 DIAGNOSIS — L570 Actinic keratosis: Secondary | ICD-10-CM | POA: Diagnosis not present

## 2022-06-07 DIAGNOSIS — D0462 Carcinoma in situ of skin of left upper limb, including shoulder: Secondary | ICD-10-CM | POA: Diagnosis not present

## 2022-06-07 DIAGNOSIS — D485 Neoplasm of uncertain behavior of skin: Secondary | ICD-10-CM | POA: Diagnosis not present

## 2022-06-09 ENCOUNTER — Telehealth: Payer: Self-pay | Admitting: Orthopaedic Surgery

## 2022-06-09 ENCOUNTER — Telehealth: Payer: Self-pay | Admitting: Cardiovascular Disease

## 2022-06-09 DIAGNOSIS — N289 Disorder of kidney and ureter, unspecified: Secondary | ICD-10-CM

## 2022-06-09 NOTE — Telephone Encounter (Signed)
Pt. Returning your call for results

## 2022-06-09 NOTE — Telephone Encounter (Signed)
Can you call her --I cant advise medical advise

## 2022-06-09 NOTE — Telephone Encounter (Signed)
Pt fell this AM --she wants to be seen --however we are a week  out --not sure if she needs to go somewhere like ER or see another provider

## 2022-06-09 NOTE — Telephone Encounter (Signed)
Advised patient of lab resultsa and lab orders placed

## 2022-06-09 NOTE — Telephone Encounter (Signed)
Patient returning call for lab results. 

## 2022-06-09 NOTE — Telephone Encounter (Signed)
-----   Message from Skeet Latch, MD sent at 06/05/2022 11:36 AM EDT ----- Kidney function is higher than usual.  This is probably because she was taking 2 doses of telmisartan/HCTZ.  Now that she is off these medicines please come back for repeat labs.  Autoimmune labs are normal.  Heart failure labs are normal.  No urinary tract infection.

## 2022-06-13 ENCOUNTER — Ambulatory Visit (INDEPENDENT_AMBULATORY_CARE_PROVIDER_SITE_OTHER): Payer: PPO | Admitting: Orthopaedic Surgery

## 2022-06-13 ENCOUNTER — Encounter: Payer: Self-pay | Admitting: Orthopaedic Surgery

## 2022-06-13 ENCOUNTER — Ambulatory Visit (INDEPENDENT_AMBULATORY_CARE_PROVIDER_SITE_OTHER): Payer: PPO

## 2022-06-13 DIAGNOSIS — S93402A Sprain of unspecified ligament of left ankle, initial encounter: Secondary | ICD-10-CM | POA: Diagnosis not present

## 2022-06-13 DIAGNOSIS — M25572 Pain in left ankle and joints of left foot: Secondary | ICD-10-CM
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
## 2022-07-20 ENCOUNTER — Ambulatory Visit (HOSPITAL_BASED_OUTPATIENT_CLINIC_OR_DEPARTMENT_OTHER): Payer: PPO

## 2022-07-20 NOTE — Progress Notes (Deleted)
07/20/2022 Natalie Bond 01-10-1956 [id number]   HPI:  Natalie Bond is a 66 y.o. female patient of Dr Oval Linsey, with a PMH below who presents today for hypertension clinic follow up.  Natalie Bond was referred to the [facility] by Dr. Alvino Chapel back in February, at which time her pressure was noted to be 196/90.  She was followed in our RPM research until her last visit with Dr. Oval Linsey in June.  At that time her pressure was improved to 118/62, however it was discovered that she was taking telmisartan hct 80/25 and telmisartan 80/25 at the same time.  This led to an increase in SCr, although < 20%.  Both medications were stopped and she was given spironolactone 25 mg instead.  Amlodipine also had to be cut back to 5 mg, because of significant LEE.     Needs BMET today  Past Medical History: CVA Had TIA  migraine Uses topiramate daily, fioricet prn  hyperlipidemia 3/23 LDL 145, now on Repatha  DM2 6/23 A1c 6.3, lifestyle controlled     Blood Pressure Goal:  130/80  Current Medications: amlodipine 5 mg qd, metoprolol succ 50 mg qd, spironolactone 25 mg qd  Family Hx:  Social Hx:   Diet:   Exercise:   Home BP readings:   Intolerances:   Labs:    Wt Readings from Last 3 Encounters:  05/10/22 178 lb 3.2 oz (80.8 kg)  03/17/22 181 lb 3.2 oz (82.2 kg)  01/21/22 185 lb (83.9 kg)   BP Readings from Last 3 Encounters:  05/10/22 118/62  03/17/22 138/80  01/21/22 134/84   Pulse Readings from Last 3 Encounters:  05/10/22 62  03/17/22 68  01/21/22 (!) 125    Current Outpatient Medications  Medication Sig Dispense Refill   acetaminophen (TYLENOL) 325 MG tablet Take 325 mg by mouth as needed.     amLODipine (NORVASC) 5 MG tablet Take 1 tablet (5 mg total) by mouth daily. 90 tablet 3   aspirin 81 MG tablet Take 81 mg by mouth daily.     butalbital-acetaminophen-caffeine (FIORICET) 50-325-40 MG tablet Take 1 tablet by mouth every 12 (twelve) hours as needed for headache. 14  tablet 0   cyclobenzaprine (FLEXERIL) 10 MG tablet Take 10 mg by mouth 3 (three) times daily as needed.     dicyclomine (BENTYL) 20 MG tablet Take 20 mg by mouth every 6 (six) hours.     Evolocumab (REPATHA SURECLICK) 628 MG/ML SOAJ take 140 mg     gabapentin (NEURONTIN) 100 MG capsule Take 100 mg by mouth as needed.     HYDROcodone-acetaminophen (NORCO) 5-325 MG tablet Take 1-2 tablets by mouth 2 (two) times daily as needed for moderate pain. 20 tablet 0   magnesium 30 MG tablet Take 30 mg by mouth 2 (two) times daily.     meclizine (ANTIVERT) 25 MG tablet Take 25 mg by mouth as needed.     meloxicam (MOBIC) 15 MG tablet Take 15 mg by mouth daily as needed.     methimazole (TAPAZOLE) 5 MG tablet Take 5 mg by mouth daily.     metoprolol succinate (TOPROL-XL) 50 MG 24 hr tablet Take 50 mg by mouth daily. Take with or immediately following a meal.     ondansetron (ZOFRAN) 4 MG tablet Take 1-2 tablets (4-8 mg total) by mouth every 8 (eight) hours as needed for nausea or vomiting. 20 tablet 0   pantoprazole (PROTONIX) 40 MG tablet Take 40 mg by mouth  daily.     spironolactone (ALDACTONE) 25 MG tablet Take 1 tablet (25 mg total) by mouth daily. 90 tablet 3   topiramate (TOPAMAX) 100 MG tablet Take 100 mg by mouth at bedtime.     Current Facility-Administered Medications  Medication Dose Route Frequency Provider Last Rate Last Admin   methylPREDNISolone acetate (DEPO-MEDROL) injection 80 mg  80 mg Other Once Magnus Sinning, MD        Allergies  Allergen Reactions   Dilaudid [Hydromorphone Hcl] Anaphylaxis   Dilaudid [Hydromorphone Hcl] Anaphylaxis    [name]    Penicillins Anaphylaxis and Rash    Has patient had a PCN reaction causing immediate rash, facial/tongue/throat swelling, SOB or lightheadedness with hypotension: Yes Has patient had a PCN reaction causing severe rash involving mucus membranes or skin necrosis: No Has patient had a PCN reaction that required  hospitalization Yes Has patient had a PCN reaction occurring within the last 10 years: No If all of the above answers are "NO", then may proceed with Cephalosporin use.    Atorvastatin     Other reaction(s): myalgias   Carvedilol     Other reaction(s): groggy   Gemfibrozil     Other reaction(s): fatigue   Lisinopril     Other reaction(s): cough   Metformin Hcl     Other reaction(s): stomach upset   Mobic [Meloxicam]     Heartburn and elevated bp   Penicillin G Sodium     Other reaction(s): Unknown   Penicillins Hives   Valsartan     Other reaction(s): headache    Past Medical History:  Diagnosis Date   Arthritis    Arthritis    back, wrists   Cancer ([place])    skin cancer   Carpal tunnel syndrome    Complication of anesthesia    DDD (degenerative disc disease)    Diabetes mellitus without complication ([place])    does not take meds   Dysrhythmia 1995   irreg hr   Full dentures    GERD (gastroesophageal reflux disease)    Headache    High cholesterol    History of bronchitis    History of kidney stones    2015   Hypertension    PONV (postoperative nausea and vomiting)    Sleep apnea    pt. does not use a CPAP or BiPAP   Stroke ([place])    Pt. states possible mini stroke in 2014, no deficits    There were no vitals taken for this visit.  No problem-specific Assessment & Plan notes found for this encounter.   Tommy Medal PharmD CPP [facility] [address] [place], [place] [address] [phone]

## 2022-08-04 DIAGNOSIS — L0109 Other impetigo: Secondary | ICD-10-CM | POA: Diagnosis not present

## 2022-08-04 DIAGNOSIS — L308 Other specified dermatitis: Secondary | ICD-10-CM | POA: Diagnosis not present

## 2022-08-04 DIAGNOSIS — Z85828 Personal history of other malignant neoplasm of skin: Secondary | ICD-10-CM | POA: Diagnosis not present

## 2022-08-17 ENCOUNTER — Encounter: Payer: Self-pay | Admitting: Orthopaedic Surgery

## 2022-08-17 ENCOUNTER — Ambulatory Visit (INDEPENDENT_AMBULATORY_CARE_PROVIDER_SITE_OTHER): Payer: PPO

## 2022-08-17 ENCOUNTER — Ambulatory Visit (INDEPENDENT_AMBULATORY_CARE_PROVIDER_SITE_OTHER): Payer: PPO | Admitting: Orthopaedic Surgery

## 2022-08-17 DIAGNOSIS — M25512 Pain in left shoulder: Secondary | ICD-10-CM

## 2022-08-17 DIAGNOSIS — G8929 Other chronic pain: Secondary | ICD-10-CM | POA: Diagnosis not present

## 2022-08-17 MED ORDER — LIDOCAINE HCL 1 % IJ SOLN
3.0000 mL | INTRAMUSCULAR | Status: AC | PRN
  Administered 2022-08-17: 3 mL

## 2022-08-17 MED ORDER — METHYLPREDNISOLONE ACETATE 40 MG/ML IJ SUSP
40.0000 mg | INTRAMUSCULAR | Status: AC | PRN
  Administered 2022-08-17: 40 mg via INTRA_ARTICULAR

## 2022-08-17 NOTE — Progress Notes (Signed)
Office Visit Note   [name]           Date of Birth: 04-May-1956           MRN: [id number] Visit Date: 08/17/2022              Requested by: Donnajean Lopes, MD [place],  [place] [address] PCP: Donnajean Lopes, MD   Assessment & Plan: Visit Diagnoses:  1. Chronic left shoulder pain     Plan: She was given shoulder exercises and Thera-Band.  Reviewed the exercises with her today.  Plan to follow-up with [place] in 2 weeks see how she is doing overall.  Questions encouraged and answered at length.  Follow-Up Instructions: Return in about 2 weeks (around 08/31/2022).   Orders:  Orders Placed This Encounter  Procedures   Large Joint Inj   XR Shoulder Left   No orders of the defined types were placed in this encounter.     Procedures: Large Joint Inj: L subacromial bursa on 08/17/2022 11:03 AM Indications: pain Details: 22 G 1.5 in needle, superior approach  Arthrogram: No  Medications: 3 mL lidocaine 1 %; 40 mg methylPREDNISolone acetate 40 MG/ML Outcome: tolerated well, no immediate complications Procedure, treatment alternatives, risks and benefits explained, specific risks discussed. Consent was given by the patient. Immediately prior to procedure a time out was called to verify the correct patient, procedure, equipment, support staff and site/side marked as required. Patient was prepped and draped in the usual sterile fashion.       Clinical Data: No additional findings.   Subjective: Chief Complaint  Patient presents with   Left Shoulder - Pain    HPI Mrs. Pickerill comes in today for left shoulder pain.  She states she has left shoulder pain that is becoming worse over a considerable amount of time.  No particular injury.  Notes decreased range of motion and strength.  Occasionally has some numbness in her hand but no real radicular symptoms down the left arm.  She unfortunately has been dealing with the loss of multiple family  members recently.  She is diabetic but only diet controlled.  Reports that she does usually check her sugars and they are usually not over 120s.  Review of Systems  Constitutional:  Negative for chills and fever.     Objective: Vital Signs: There were no vitals taken for this visit.  Physical Exam Constitutional:      Appearance: She is not ill-appearing or diaphoretic.  Pulmonary:     Effort: Pulmonary effort is normal.  Neurological:     Mental Status: She is alert and oriented to person, place, and time.  Psychiatric:        Mood and Affect: Mood normal.     Ortho Exam Bilateral shoulders limited, strength with external and internal rotation against resistance.  Empty can test is negative bilaterally.  Liftoff test negative bilaterally.  Impingement testing positive on the left mildly positive on the right.  Full range of motion of the right shoulder actively without pain.  Slightly diminished forward flexion left shoulder secondary to pain to approximately 160 degrees passively and bring her to 180 degrees but it is painful on the left. Specialty Comments:  No specialty comments available.  Imaging: XR Shoulder Left  Result Date: 08/17/2022 Left shoulder 3 views: Shoulder is well located.  Glenohumeral joints well-maintained.  Subacromial space well-maintained.  No significant arthritic changes.  No acute fractures bony abnormalities.  PMFS History: Patient Active Problem List   Diagnosis Date Noted   Acquired hypothyroidism 03/17/2022   Cholangiectasis 03/17/2022   Esophageal dysphagia 03/17/2022   Fecal urgency 03/17/2022   Hyperglycemia 03/17/2022   Hypomagnesemia 03/17/2022   Neck pain 03/17/2022   Salivary calculus 03/17/2022   Spasm 03/17/2022   Hyperthyroidism 03/17/2022   Primary osteoarthritis of first carpometacarpal joint of left hand 12/24/2019   Vomiting 08/28/2019   Diabetic peripheral neuropathy associated with type 2 diabetes mellitus ([place])  06/06/2019   Hemiplegia ([place]) 06/06/2019   TIA (transient ischemic attack) 05/29/2019   Anterolisthesis 05/29/2019   Thyroid nodule 05/29/2019   Stroke (cerebrum) ([place]) 05/27/2019   Right arm weakness 05/27/2019   Type 2 diabetes mellitus without complication ([place]) 76/16/0737   Arthritis of carpometacarpal (CMC) joint of right thumb 04/16/2019   Generalized pruritus 02/27/2019   Rash and other nonspecific skin eruption 02/27/2019   Dysuria 01/03/2019   Localized edema 10/22/2018   Shoulder joint pain 08/28/2018   Mixed conductive and sensorineural hearing loss of left ear with restricted hearing of right ear 04/26/2018   Vertigo 04/26/2018   Acute serous otitis media of left ear 04/26/2018   Referred otalgia of left ear 04/26/2018   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   Venous stasis dermatitis of left lower extremity 04/09/2018   Achilles tendon contracture, left 04/09/2018   Pain in left ankle and joints of left foot 04/09/2018   Cough 04/09/2018   Otitis media 04/09/2018   Primary osteoarthritis of first carpometacarpal joint of right hand 10/09/2017   Bilateral hand pain 10/05/2017   Primary osteoarthritis of both first carpometacarpal joints 10/05/2017   Trochanteric bursitis, right hip 06/29/2017   Degenerative disc disease at L5-S1 level 05/04/2017   History of joint replacement 05/04/2017   Claw toe, acquired, right 04/27/2017   Achilles tendon contracture, right 04/27/2017   Pain in right foot 04/11/2017   Status post left hip replacement 10/21/2016   Chronic back pain 01/14/2016   Migraine 01/14/2016   Hematuria 03/27/2015   Shortness of breath 03/27/2015   Essential hypertension 01/22/2015   Obstructive sleep apnea (adult) (pediatric) 10/30/2014   Psychophysiologic insomnia 04/04/2014   Gastro-esophageal reflux disease without esophagitis 09/12/2013   Obesity 12/02/2011   Disequilibrium 11/10/2009    Enterocolitis due to Clostridium difficile 11/10/2009   Family history of diabetes mellitus 11/10/2009   Hyperlipidemia 11/10/2009   Meniere disease 11/10/2009   Headache 11/10/2009   Perforation of tympanic membrane 11/10/2009   Soft tissue lesion of shoulder region 11/10/2009   Past Medical History:  Diagnosis Date   Arthritis    Arthritis    back, wrists   Cancer ([place])    skin cancer   Carpal tunnel syndrome    Complication of anesthesia    DDD (degenerative disc disease)    Diabetes mellitus without complication ([place])    does not take meds   Dysrhythmia 1995   irreg hr   Full dentures    GERD (gastroesophageal reflux disease)    Headache    High cholesterol    History of bronchitis    History of kidney stones    2015   Hypertension    PONV (postoperative nausea and vomiting)    Sleep apnea    pt. does not use a CPAP or BiPAP   Stroke ([place])    Pt. states possible mini stroke in 2014, no deficits    Family History  Problem Relation Age of Onset  Cancer Mother    Diabetes Mother    Heart disease Mother    Hyperlipidemia Mother    Hypertension Mother    Cancer Father    Diabetes Father    Heart disease Father    Hyperlipidemia Father    Hypertension Father    Varicose Veins Father    Hypertension Sister    Cancer Sister        colon   Heart disease Sister    Hyperlipidemia Sister    Hypertension Brother    Cancer Brother    Diabetes Brother    Hyperlipidemia Brother    Cancer Brother        pancreatic    Past Surgical History:  Procedure Laterality Date   ABDOMINAL HYSTERECTOMY     BACK SURGERY  2004   [id number]   BACK SURGERY     CARDIAC CATHETERIZATION     01/27/15 [facility]): EF 60%, normal coronaries.   CARPAL TUNNEL RELEASE  09/20/2012   Procedure: CARPAL TUNNEL RELEASE;  Surgeon: Cammie Sickle., MD;  Location: [place];  Service: Orthopedics;  Laterality: Left;   CARPOMETACARPEL SUSPENSION PLASTY Right 12/12/2018    Procedure: RIGHT THUMB CARPOMETACARPAL [facility]) ARTHROPLASTY;  Surgeon: Leandrew Koyanagi, MD;  Location: [place];  Service: Orthopedics;  Laterality: Right;   CARPOMETACARPEL SUSPENSION PLASTY Left 12/30/2019   Procedure: LEFT THUMB CARPOMETACARPAL ([place]) ARTHROPLASTY;  Surgeon: Leandrew Koyanagi, MD;  Location: [place];  Service: Orthopedics;  Laterality: Left;   CATARACT EXTRACTION, BILATERAL     CHOLECYSTECTOMY  07/2016   done [place]   DORSAL COMPARTMENT RELEASE  09/20/2012   Procedure: RELEASE DORSAL COMPARTMENT ([place]);  Surgeon: Cammie Sickle., MD;  Location[facility];  Service: Orthopedics;  Laterality: Left;   EYE SURGERY     bilat cataracts   EYE SURGERY     HERNIA REPAIR Left 1990   left groin    LOWER EXTREMITY ANGIOGRAM Left 06/08/2015   Procedure: Ascending Venogram  Iliac Vein ;  Surgeon: Elam Dutch, MD;  Location: [facility];  Service: Vascular;  Laterality: Left;  ;  TARA- [facility] / [facility] HAVE BEEN NOTIFIED/ CONFIRMED   PARTIAL HYSTERECTOMY     SHOULDER SURGERY     TOTAL HIP ARTHROPLASTY Left 10/21/2016   Procedure: LEFT TOTAL HIP ARTHROPLASTY ANTERIOR APPROACH;  Surgeon: Mcarthur Rossetti, MD;  Location: [facility];  Service: Orthopedics;  Laterality: Left;   WRIST SURGERY Bilateral    carpal tunnel   Social History   Occupational History   Occupation: [employer], art: [employer]  Tobacco Use   Smoking status: Never   Smokeless tobacco: Never  Substance and Sexual Activity   Alcohol use: No   Drug use: No   Sexual activity: Not on file

## 2022-08-23 ENCOUNTER — Telehealth: Payer: Self-pay | Admitting: Physical Medicine and Rehabilitation

## 2022-08-23 DIAGNOSIS — R232 Flushing: Secondary | ICD-10-CM | POA: Diagnosis not present

## 2022-08-23 DIAGNOSIS — M25512 Pain in left shoulder: Secondary | ICD-10-CM

## 2022-08-23 DIAGNOSIS — M5416 Radiculopathy, lumbar region: Secondary | ICD-10-CM

## 2022-08-23 DIAGNOSIS — E0590 Thyrotoxicosis, unspecified without thyrotoxic crisis or storm: Secondary | ICD-10-CM | POA: Diagnosis not present

## 2022-08-23 DIAGNOSIS — I10 Essential (primary) hypertension: Secondary | ICD-10-CM | POA: Diagnosis not present

## 2022-08-23 DIAGNOSIS — R197 Diarrhea, unspecified: Secondary | ICD-10-CM | POA: Diagnosis not present

## 2022-08-23 NOTE — Telephone Encounter (Signed)
IC talked with patient. She had prior injection in January with about 70% relief without a new injury.  She is asking for another injection of some sorts.

## 2022-08-23 NOTE — Telephone Encounter (Signed)
Patient called. Would like an appointment with Dr. Ernestina Patches. Her call back number is [phone]

## 2022-08-23 NOTE — Telephone Encounter (Signed)
Referral placed. Patient aware will reach out to schedule once PA obtained

## 2022-08-24 DIAGNOSIS — R197 Diarrhea, unspecified: Secondary | ICD-10-CM | POA: Diagnosis not present

## 2022-09-07 ENCOUNTER — Ambulatory Visit (INDEPENDENT_AMBULATORY_CARE_PROVIDER_SITE_OTHER): Payer: PPO | Admitting: Orthopaedic Surgery

## 2022-09-07 ENCOUNTER — Encounter: Payer: Self-pay | Admitting: Orthopaedic Surgery

## 2022-09-07 ENCOUNTER — Other Ambulatory Visit: Payer: Self-pay

## 2022-09-07 DIAGNOSIS — G8929 Other chronic pain: Secondary | ICD-10-CM

## 2022-09-07 DIAGNOSIS — M25512 Pain in left shoulder: Secondary | ICD-10-CM

## 2022-09-07 NOTE — Progress Notes (Signed)
The patient comes in today for continued follow-up as a relates to 3 months of worsening left shoulder pain.  We saw her a few weeks ago and gave her a Thera-Band and showed her a lot of exercises to perform for her left shoulder.  That home exercise program is helping some.  We also provided a steroid injection in the subacromial outlet.  She is still having significant left shoulder pain and some weakness as well.  She says it still stays really sore for her.  On exam her motion is improved significantly from the home exercise program but she is still weak with that shoulder in terms of external rotation and abduction.  She is an active 66 year old female.  There is no atrophy that I can see on inspection but she does have significant pain also over the proximal biceps tendon.  At this point a MRI of the left shoulder is warranted to rule out tearing of the rotator cuff or even the biceps tendon proximally so we can come up with a better treatment plan given the failure of conservative treatment.  She agrees with this plan.  We will be in touch about scheduling the MRI and then see her back accordingly and follow-up.  She will still continue her home exercise program for that left shoulder per day exercises we described to her.  She will avoid heavy lifting.

## 2022-09-16 ENCOUNTER — Telehealth: Payer: Self-pay | Admitting: Physical Medicine and Rehabilitation

## 2022-09-16 ENCOUNTER — Other Ambulatory Visit: Payer: PPO

## 2022-09-16 NOTE — Telephone Encounter (Signed)
Patient would like a call about a spinal injections she suppose to be getting [phone]

## 2022-09-26 ENCOUNTER — Ambulatory Visit (INDEPENDENT_AMBULATORY_CARE_PROVIDER_SITE_OTHER): Payer: PPO | Admitting: Physical Medicine and Rehabilitation

## 2022-09-26 ENCOUNTER — Ambulatory Visit: Payer: Self-pay

## 2022-09-26 VITALS — BP 164/95 | HR 54

## 2022-09-26 DIAGNOSIS — M5416 Radiculopathy, lumbar region: Secondary | ICD-10-CM

## 2022-09-26 MED ORDER — METHYLPREDNISOLONE ACETATE 80 MG/ML IJ SUSP
80.0000 mg | Freq: Once | INTRAMUSCULAR | Status: AC
  Administered 2022-09-26: 80 mg

## 2022-09-26 NOTE — Patient Instructions (Signed)

## 2022-09-26 NOTE — Progress Notes (Unsigned)
Numeric Pain Rating Scale and Functional Assessment Average Pain 7   In the last MONTH (on 0-10 scale) has pain interfered with the following?  1. General activity like being  able to carry out your everyday physical activities such as walking, climbing stairs, carrying groceries, or moving a chair?  Rating(9)   +Driver, -BT- Aspirin 81 mg, -Dye Allergies.  Bending and carrying things make pain worse. Radiates down both legs. Has some throbbing in right leg and starting to go up the back this past weekend  Consider MRI or nerve study

## 2022-09-27 DIAGNOSIS — Z8669 Personal history of other diseases of the nervous system and sense organs: Secondary | ICD-10-CM | POA: Diagnosis not present

## 2022-09-27 DIAGNOSIS — H524 Presbyopia: Secondary | ICD-10-CM | POA: Diagnosis not present

## 2022-09-27 DIAGNOSIS — H52203 Unspecified astigmatism, bilateral: Secondary | ICD-10-CM | POA: Diagnosis not present

## 2022-09-27 DIAGNOSIS — E119 Type 2 diabetes mellitus without complications: Secondary | ICD-10-CM | POA: Diagnosis not present

## 2022-09-27 DIAGNOSIS — H5213 Myopia, bilateral: Secondary | ICD-10-CM | POA: Diagnosis not present

## 2022-09-27 DIAGNOSIS — H43393 Other vitreous opacities, bilateral: Secondary | ICD-10-CM | POA: Diagnosis not present

## 2022-09-27 DIAGNOSIS — Z961 Presence of intraocular lens: Secondary | ICD-10-CM | POA: Diagnosis not present

## 2022-09-29 NOTE — Procedures (Signed)
S1 Lumbosacral Transforaminal Epidural Steroid Injection - Sub-Pedicular Approach with Fluoroscopic Guidance   [name]      Date of Birth: March 26, 1956 MRN: [id number] PCP: Donnajean Lopes, MD      Visit Date: 09/26/2022   Universal Protocol:    Date/Time: 11/02/234:48 AM  Consent Given By: the patient  Position:  PRONE  Additional Comments: Vital signs were monitored before and after the procedure. Patient was prepped and draped in the usual sterile fashion. The correct patient, procedure, and site was verified.   Injection Procedure Details:  Procedure Site One Meds Administered:  Meds ordered this encounter  Medications   methylPREDNISolone acetate (DEPO-MEDROL) injection 80 mg    Laterality: Bilateral  Location/Site:  S1 Foramen   Needle size: 22 ga.  Needle type: Spinal  Needle Placement: Transforaminal  Findings:   -Comments: Excellent flow of contrast along the nerve, nerve root and into the epidural space.  Epidurogram: Contrast epidurogram showed no nerve root cut off or restricted flow pattern.  Procedure Details: After squaring off the sacral end-plate to get a true AP view, the C-arm was positioned so that the best possible view of the S1 foramen was visualized. The soft tissues overlying this structure were infiltrated with 2-3 ml. of 1% Lidocaine without Epinephrine.    The spinal needle was inserted toward the target using a "trajectory" view along the fluoroscope beam.  Under AP and lateral visualization, the needle was advanced so it did not puncture dura. Biplanar projections were used to confirm position. Aspiration was confirmed to be negative for CSF and/or blood. A 1-2 ml. volume of Isovue-250 was injected and flow of contrast was noted at each level. Radiographs were obtained for documentation purposes.   After attaining the desired flow of contrast documented above, a 0.5 to 1.0 ml test dose of 0.25% Marcaine was injected into  each respective transforaminal space.  The patient was observed for 90 seconds post injection.  After no sensory deficits were reported, and normal lower extremity motor function was noted,   the above injectate was administered so that equal amounts of the injectate were placed at each foramen (level) into the transforaminal epidural space.   Additional Comments:  The patient tolerated the procedure well Dressing: Band-Aid with 2 x 2 sterile gauze    Post-procedure details: Patient was observed during the procedure. Post-procedure instructions were reviewed.  Patient left the clinic in stable condition.

## 2022-09-29 NOTE — Progress Notes (Signed)
Natalie Bond - 66 y.o. female MRN [id number]  Date of birth: July 16, 1956  Office Visit Note: Visit Date: 09/26/2022 PCP: Donnajean Lopes, MD Referred by: Lorine Bears, NP  Subjective: Chief Complaint  Patient presents with   Lower Back - Pain   HPI: Natalie Bond is a 66 y.o. female who comes in today for evaluation and management of continued back hip and leg pain.  She is a patient that we have seen for quite a many number of years through multiple orthopedic surgeons in our practice.  Most recently she has been followed by Dr. Ninfa Linden for her right shoulder with a right shoulder MRI pending.  She has multiple joint complaints multiple pain complaints pain usually down the left back of the leg but now with pain on both sides with numbness and tingling and she reports frank numbness to touch in both legs and feet.  She has multiple drug intolerances.  No defined history of fibromyalgia.  Multiple procedures in the past.  Last MSR was in January with more left-sided leg pain.  She reports pain into the back of the legs down to the calves and foot.  Mostly at S1 dermatome.  Prior MRI is not very revealing she has had prior lumbar surgery remotely.  No fusion.  She is now disabled and it used to be felt that a lot of her symptoms she would describe is work-related are still long-lasting.  She has not had electrodiagnostic study of the lower limbs.  No real reason for neuropathy.  She rates her pain as 7 out of 10 in terms of functional loss and ADLs a 9 out of 10.  She reports symptoms worse with bending and carrying objects she has pain radiating down both legs a throbbing pain in the right more than left.  This throbbing pain is new.  She reports removing some tree branches and trees this weekend and increased pain.  Her other pain was present prior to that it had been worsening over the last several months.     Review of Systems  Musculoskeletal:  Positive for back pain and joint pain.   Neurological:  Positive for tingling.  All other systems reviewed and are negative.  Otherwise per HPI.  Assessment & Plan: Visit Diagnoses:    ICD-10-CM   1. Lumbar radiculopathy  M54.16 XR C-ARM NO REPORT    Epidural Steroid injection    methylPREDNISolone acetate (DEPO-MEDROL) injection 80 mg       Plan: Findings:  Chronic pain syndrome with chronic back pain and left more than right leg pain now bilateral with numbness and tingling.  Symptoms do not quite fit her MRI findings.  She has gotten decent relief with epidural injection in the past.  Plan at this point is to repeat epidural injection at S1 bilaterally.  Symptoms are more S1 related even though they do not really fit with the most current MRI.  If she does not get relief with that would look at updated MRI of the lumbar spine versus sending to neurology for work-up of neuropathy.  She is currently seeing rheumatology.  Consider diagnosis of fibromyalgia.    Meds & Orders:  Meds ordered this encounter  Medications   methylPREDNISolone acetate (DEPO-MEDROL) injection 80 mg    Orders Placed This Encounter  Procedures   XR C-ARM NO REPORT   Epidural Steroid injection    Follow-up: Return for visit to requesting provider as needed.   Procedures: No procedures  performed  S1 Lumbosacral Transforaminal Epidural Steroid Injection - Sub-Pedicular Approach with Fluoroscopic Guidance   [name]      Date of Birth: 02/25/1956 MRN: [id number] PCP: Donnajean Lopes, MD      Visit Date: 09/26/2022   Universal Protocol:    Date/Time: 11/02/234:48 AM  Consent Given By: the patient  Position:  PRONE  Additional Comments: Vital signs were monitored before and after the procedure. Patient was prepped and draped in the usual sterile fashion. The correct patient, procedure, and site was verified.   Injection Procedure Details:  Procedure Site One Meds Administered:  Meds ordered this encounter   Medications   methylPREDNISolone acetate (DEPO-MEDROL) injection 80 mg    Laterality: Bilateral  Location/Site:  S1 Foramen   Needle size: 22 ga.  Needle type: Spinal  Needle Placement: Transforaminal  Findings:   -Comments: Excellent flow of contrast along the nerve, nerve root and into the epidural space.  Epidurogram: Contrast epidurogram showed no nerve root cut off or restricted flow pattern.  Procedure Details: After squaring off the sacral end-plate to get a true AP view, the C-arm was positioned so that the best possible view of the S1 foramen was visualized. The soft tissues overlying this structure were infiltrated with 2-3 ml. of 1% Lidocaine without Epinephrine.    The spinal needle was inserted toward the target using a "trajectory" view along the fluoroscope beam.  Under AP and lateral visualization, the needle was advanced so it did not puncture dura. Biplanar projections were used to confirm position. Aspiration was confirmed to be negative for CSF and/or blood. A 1-2 ml. volume of Isovue-250 was injected and flow of contrast was noted at each level. Radiographs were obtained for documentation purposes.   After attaining the desired flow of contrast documented above, a 0.5 to 1.0 ml test dose of 0.25% Marcaine was injected into each respective transforaminal space.  The patient was observed for 90 seconds post injection.  After no sensory deficits were reported, and normal lower extremity motor function was noted,   the above injectate was administered so that equal amounts of the injectate were placed at each foramen (level) into the transforaminal epidural space.   Additional Comments:  The patient tolerated the procedure well Dressing: Band-Aid with 2 x 2 sterile gauze    Post-procedure details: Patient was observed during the procedure. Post-procedure instructions were reviewed.  Patient left the clinic in stable condition.   Clinical History: MRI  LUMBAR SPINE WITHOUT CONTRAST     TECHNIQUE:  Multiplanar, multisequence MR imaging of the lumbar spine was  performed. No intravenous contrast was administered.     COMPARISON:  12/18/2015 lumbar spine MRI.     FINDINGS:  Segmentation:  Standard.     Alignment: Mild lumbar levocurvature with apex at L3. Normal lumbar  lordosis without listhesis.     Vertebrae: No fracture, evidence of discitis, or bone lesion.  Right-sided L4-5 facet edema.     Conus medullaris and cauda equina: Conus extends to the L1-2 level.  Conus and cauda equina appear normal.     Paraspinal and other soft tissues: Chronic postsurgical changes  related to left L5-S1 hemilaminectomy. No fluid collection.     Disc levels:     T12-L1: No significant disc displacement, foraminal stenosis, or  canal stenosis.     L1-2: No significant disc displacement, foraminal stenosis, or canal  stenosis.     L2-3: Stable small left foraminal disc protrusion. No significant  foraminal or canal stenosis.     L3-4: Stable disc bulge and facet hypertrophy. Interval resorption  of right juxta-articular cyst. Mild bilateral recess stenosis. No  foraminal or canal stenosis.     L4-5: Stable disc bulge, facet hypertrophy, and ligamentum flavum  hypertrophy, greater on the left. Mild left lateral recess stenosis.  No foraminal or canal stenosis.     L5-S1: Left hemilaminectomy. Stable left central disc protrusion  with mild left lateral recess stenosis. No significant foraminal or  canal stenosis.     IMPRESSION:  1. No acute fracture.  Mild levocurvature of the lumbar spine.  2. Right L4-5 facet edema, likely degenerative.  3. Stable lumbar spondylosis. No significant foraminal or canal  stenosis.        Electronically Signed    By: Kristine Garbe M.D.    On: 12/01/2018 14:02   She reports that she has never smoked. She has never used smokeless tobacco. No results for input(s): "HGBA1C", "LABURIC" in  the last 8760 hours.  Objective:  VS:  HT:    WT:   BMI:     BP:(!) 164/95  HR:(!) 54bpm  TEMP: ( )  RESP:  Physical Exam Vitals and nursing note reviewed.  Constitutional:      General: She is not in acute distress.    Appearance: Normal appearance. She is well-developed. She is not ill-appearing.  HENT:     Head: Normocephalic and atraumatic.     Right Ear: External ear normal.     Left Ear: External ear normal.  Eyes:     Extraocular Movements: Extraocular movements intact.     Conjunctiva/sclera: Conjunctivae normal.     Pupils: Pupils are equal, round, and reactive to light.  Cardiovascular:     Rate and Rhythm: Normal rate.     Pulses: Normal pulses.  Pulmonary:     Effort: Pulmonary effort is normal. No respiratory distress.  Abdominal:     General: There is no distension.     Palpations: Abdomen is soft.  Musculoskeletal:        General: Tenderness present.     Cervical back: Neck supple.     Right lower leg: No edema.     Left lower leg: No edema.     Comments: Patient has good distal strength with no pain over the greater trochanters.  No clonus or focal weakness.  Skin:    General: Skin is warm and dry.     Findings: No erythema, lesion or rash.  Neurological:     General: No focal deficit present.     Mental Status: She is alert and oriented to person, place, and time.     Sensory: No sensory deficit.     Motor: No weakness or abnormal muscle tone.     Coordination: Coordination normal.     Gait: Gait normal.  Psychiatric:        Mood and Affect: Mood normal.        Behavior: Behavior normal.     Ortho Exam  Imaging: No results found.  Past Medical/Family/Surgical/Social History: Medications & Allergies reviewed per EMR, new medications updated. Patient Active Problem List   Diagnosis Date Noted   Acquired hypothyroidism 03/17/2022   Cholangiectasis 03/17/2022   Esophageal dysphagia 03/17/2022   Fecal urgency 03/17/2022   Hyperglycemia  03/17/2022   Hypomagnesemia 03/17/2022   Neck pain 03/17/2022   Salivary calculus 03/17/2022   Spasm 03/17/2022   Hyperthyroidism 03/17/2022   Primary osteoarthritis of first carpometacarpal  joint of left hand 12/24/2019   Vomiting 08/28/2019   Diabetic peripheral neuropathy associated with type 2 diabetes mellitus ([place]) 06/06/2019   Hemiplegia ([place]) 06/06/2019   TIA (transient ischemic attack) 05/29/2019   Anterolisthesis 05/29/2019   Thyroid nodule 05/29/2019   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/27/2019   Right arm weakness 05/27/2019   Type 2 diabetes mellitus without complication ([place]) 17/00/1749   Arthritis of carpometacarpal [facility]) joint of right thumb 04/16/2019   Generalized pruritus 02/27/2019   Rash and other nonspecific skin eruption 02/27/2019   Dysuria 01/03/2019   Localized edema 10/22/2018   Shoulder joint pain 08/28/2018   Mixed conductive and sensorineural hearing loss of left ear with restricted hearing of right ear 04/26/2018   Vertigo 04/26/2018   Acute serous otitis media of left ear 04/26/2018   Referred otalgia of left ear 04/26/2018   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   Venous stasis dermatitis of left lower extremity 04/09/2018   Achilles tendon contracture, left 04/09/2018   Pain in left ankle and joints of left foot 04/09/2018   Cough 04/09/2018   Otitis media 04/09/2018   Primary osteoarthritis of first carpometacarpal joint of right hand 10/09/2017   Bilateral hand pain 10/05/2017   Primary osteoarthritis of both first carpometacarpal joints 10/05/2017   Trochanteric bursitis, right hip 06/29/2017   Degenerative disc disease at L5-S1 level 05/04/2017   History of joint replacement 05/04/2017   Claw toe, acquired, right 04/27/2017   Achilles tendon contracture, right 04/27/2017   Pain in right foot 04/11/2017   Status post left hip replacement 10/21/2016   Chronic back pain 01/14/2016   Migraine  01/14/2016   Hematuria 03/27/2015   Shortness of breath 03/27/2015   Essential hypertension 01/22/2015   Obstructive sleep apnea (adult) (pediatric) 10/30/2014   Psychophysiologic insomnia 04/04/2014   Gastro-esophageal reflux disease without esophagitis 09/12/2013   Obesity 12/02/2011   Disequilibrium 11/10/2009   Enterocolitis due to Clostridium difficile 11/10/2009   Family history of diabetes mellitus 11/10/2009   Hyperlipidemia 11/10/2009   Meniere disease 11/10/2009   Headache 11/10/2009   Perforation of tympanic membrane 11/10/2009   Soft tissue lesion of shoulder region 11/10/2009   Past Medical History:  Diagnosis Date   Arthritis    Arthritis    back, wrists   Cancer ([place])    skin cancer   Carpal tunnel syndrome    Complication of anesthesia    DDD (degenerative disc disease)    Diabetes mellitus without complication ([place])    does not take meds   Dysrhythmia 1995   irreg hr   Full dentures    GERD (gastroesophageal reflux disease)    Headache    High cholesterol    History of bronchitis    History of kidney stones    2015   Hypertension    PONV (postoperative nausea and vomiting)    Sleep apnea    pt. does not use a CPAP or BiPAP   Stroke ([place])    Pt. states possible mini stroke in 2014, no deficits   Family History  Problem Relation Age of Onset   Cancer Mother    Diabetes Mother    Heart disease Mother    Hyperlipidemia Mother    Hypertension Mother    Cancer Father    Diabetes Father    Heart disease Father    Hyperlipidemia Father    Hypertension Father    Varicose Veins Father    Hypertension Sister  Cancer Sister        colon   Heart disease Sister    Hyperlipidemia Sister    Hypertension Brother    Cancer Brother    Diabetes Brother    Hyperlipidemia Brother    Cancer Brother        pancreatic   Past Surgical History:  Procedure Laterality Date   ABDOMINAL HYSTERECTOMY     BACK SURGERY  2004   x2lumb-lam   BACK SURGERY      CARDIAC CATHETERIZATION     01/27/15 [facility]): EF 60%, normal coronaries.   CARPAL TUNNEL RELEASE  09/20/2012   Procedure: CARPAL TUNNEL RELEASE;  Surgeon: Cammie Sickle., MD;  Location: [place];  Service: Orthopedics;  Laterality: Left;   CARPOMETACARPEL SUSPENSION PLASTY Right 12/12/2018   Procedure: RIGHT THUMB CARPOMETACARPAL [facility]) ARTHROPLASTY;  Surgeon: Leandrew Koyanagi, MD;  Location: [place];  Service: Orthopedics;  Laterality: Right;   CARPOMETACARPEL SUSPENSION PLASTY Left 12/30/2019   Procedure: LEFT THUMB CARPOMETACARPAL ([place]) ARTHROPLASTY;  Surgeon: Leandrew Koyanagi, MD;  Location: [place];  Service: Orthopedics;  Laterality: Left;   CATARACT EXTRACTION, BILATERAL     CHOLECYSTECTOMY  07/2016   done [place]   DORSAL COMPARTMENT RELEASE  09/20/2012   Procedure: RELEASE DORSAL COMPARTMENT ([place]);  Surgeon: Cammie Sickle., MD;  Location: [facility];  Service: Orthopedics;  Laterality: Left;   EYE SURGERY     bilat cataracts   EYE SURGERY     HERNIA REPAIR Left 1990   left groin    LOWER EXTREMITY ANGIOGRAM Left 06/08/2015   Procedure: Ascending Venogram  Iliac Vein ;  Surgeon: Elam Dutch, MD;  Location: [facility];  Service: Vascular;  Laterality: Left;  ;  [facility] HAVE BEEN NOTIFIED/ CONFIRMED   PARTIAL HYSTERECTOMY     SHOULDER SURGERY     TOTAL HIP ARTHROPLASTY Left 10/21/2016   Procedure: LEFT TOTAL HIP ARTHROPLASTY ANTERIOR APPROACH;  Surgeon: Mcarthur Rossetti, MD;  Location: [facility];  Service: Orthopedics;  Laterality: Left;   WRIST SURGERY Bilateral    carpal tunnel   Social History   Occupational History   Occupation: [employer]ist, art: [employer]  Tobacco Use   Smoking status: Never   Smokeless tobacco: Never  Substance and Sexual Activity   Alcohol use: No   Drug use: No   Sexual activity: Not on file

## 2022-10-04 ENCOUNTER — Encounter (HOSPITAL_BASED_OUTPATIENT_CLINIC_OR_DEPARTMENT_OTHER): Payer: Self-pay | Admitting: *Deleted

## 2022-10-06 DIAGNOSIS — Z23 Encounter for immunization: Secondary | ICD-10-CM | POA: Diagnosis not present

## 2022-10-13 ENCOUNTER — Other Ambulatory Visit: Payer: Self-pay

## 2022-10-13 ENCOUNTER — Ambulatory Visit: Payer: PPO | Admitting: Obstetrics and Gynecology

## 2022-10-13 ENCOUNTER — Encounter: Payer: Self-pay | Admitting: Obstetrics and Gynecology

## 2022-10-13 VITALS — BP 169/99 | HR 66 | Ht 67.0 in | Wt 178.0 lb

## 2022-10-13 DIAGNOSIS — N951 Menopausal and female climacteric states: Secondary | ICD-10-CM | POA: Diagnosis not present

## 2022-10-13 MED ORDER — PAROXETINE HCL 10 MG PO TABS: 10.0000 mg | ORAL_TABLET | Freq: Every day | ORAL | 6 refills | Status: DC

## 2022-10-13 NOTE — Progress Notes (Signed)
GYNECOLOGY VISIT  Patient name: Natalie Bond MRN [id number]  Date of birth: 1956-11-19 Chief Complaint:   Gynecologic Exam  History:  Natalie Bond is a 66 y.o. No obstetric history on file. being seen today for hot flashes sinc3 38-039 with hysterectomy in her ealrier 75s. Hot flashes are getting severe and have gotten significantly worse x 3 years. Was on estradiol for awhile but stopped when provider noted cardiac and vascular hx.   Supposed to be on thyroid medication and just started PTU 50 - started 4 days ago. Has been off of her thyroid medication for at least 6 months.  Not taking gabapentin   Clonidine prn for elevated BP - feels very bradycardic SUI present  Sudden urge and lose urine when going  No hx of UTI  Diarrhea and immediate loss of urine with strong urge.  Mild vaginal dryness, not having a lot sexual activity   Past Medical History:  Diagnosis Date   Arthritis    Arthritis    back, wrists   Cancer ([place])    skin cancer   Carpal tunnel syndrome    Complication of anesthesia    DDD (degenerative disc disease)    Diabetes mellitus without complication ([place])    does not take meds   Dysrhythmia 1995   irreg hr   Full dentures    GERD (gastroesophageal reflux disease)    Headache    High cholesterol    History of bronchitis    History of kidney stones    2015   Hypertension    PONV (postoperative nausea and vomiting)    Sleep apnea    pt. does not use a CPAP or BiPAP   Stroke ([place])    Pt. states possible mini stroke in 2014, no deficits    Past Surgical History:  Procedure Laterality Date   ABDOMINAL HYSTERECTOMY     BACK SURGERY  2004   x2lumb-lam   BACK SURGERY     CARDIAC CATHETERIZATION     01/27/15 [facility]): EF 60%, normal coronaries.   CARPAL TUNNEL RELEASE  09/20/2012   Procedure: CARPAL TUNNEL RELEASE;  Surgeon: Cammie Sickle., MD;  Location: [place];  Service: Orthopedics;  Laterality: Left;   CARPOMETACARPEL  SUSPENSION PLASTY Right 12/12/2018   Procedure: RIGHT THUMB CARPOMETACARPAL [facility]) ARTHROPLASTY;  Surgeon: Leandrew Koyanagi, MD;  Location: [place];  Service: Orthopedics;  Laterality: Right;   CARPOMETACARPEL SUSPENSION PLASTY Left 12/30/2019   Procedure: LEFT THUMB CARPOMETACARPAL ([place]) ARTHROPLASTY;  Surgeon: Leandrew Koyanagi, MD;  Location: [place];  Service: Orthopedics;  Laterality: Left;   CATARACT EXTRACTION, BILATERAL     CHOLECYSTECTOMY  07/2016   done [place]   DORSAL COMPARTMENT RELEASE  09/20/2012   Procedure: RELEASE DORSAL COMPARTMENT ([place]);  Surgeon: Cammie Sickle., MD;  Location: [facility];  Service: Orthopedics;  Laterality: Left;   EYE SURGERY     bilat cataracts   EYE SURGERY     HERNIA REPAIR Left 1990   left groin    LOWER EXTREMITY ANGIOGRAM Left 06/08/2015   Procedure: Ascending Venogram  Iliac Vein ;  Surgeon: Elam Dutch, MD;  Location: [place];  Service: Vascular;  Laterality: Left;  ;  [facility] HAVE BEEN NOTIFIED/ CONFIRMED   PARTIAL HYSTERECTOMY     SHOULDER SURGERY     TOTAL HIP ARTHROPLASTY Left 10/21/2016   Procedure: LEFT TOTAL HIP ARTHROPLASTY  ANTERIOR APPROACH;  Surgeon: Mcarthur Rossetti, MD;  [facility];  Service: Orthopedics;  Laterality: Left;   WRIST SURGERY Bilateral    carpal tunnel    The following portions of the patient's history were reviewed and updated as appropriate: allergies, current medications, past family history, past medical history, past social history, past surgical history and problem list.    Review of Systems:  Pertinent items are noted in HPI. Comprehensive review of systems was otherwise negative.   Objective:  Physical Exam BP (!) 169/99   Pulse 66   Ht '5\' 7"'$  (1.702 m)   Wt 178 lb (80.7 kg)   BMI 27.88 kg/m    Physical Exam Vitals and nursing note reviewed.  Constitutional:      Appearance: Normal appearance.   HENT:     Head: Normocephalic and atraumatic.  Pulmonary:     Effort: Pulmonary effort is normal.  Skin:    General: Skin is warm and dry.  Neurological:     General: No focal deficit present.     Mental Status: She is alert.  Psychiatric:        Mood and Affect: Mood normal.        Behavior: Behavior normal.        Thought Content: Thought content normal.        Judgment: Judgment normal.      Labs and Imaging XR C-ARM NO REPORT  Result Date: 09/26/2022 Please see Notes tab for imaging impression.  Epidural Steroid injection  Result Date: 09/26/2022 Magnus Sinning, MD     09/29/2022  4:56 AM S1 Lumbosacral Transforaminal Epidural Steroid Injection - Sub-Pedicular Approach with Fluoroscopic Guidance [name]     Date of Birth: [birth year] MRN: [id number] PCP: Donnajean Lopes, MD     Visit Date: 09/26/2022  Universal Protocol:   Date/Time: 11/02/234:48 AM Consent Given By: the patient Position:  PRONE Additional Comments: Vital signs were monitored before and after the procedure. Patient was prepped and draped in the usual sterile fashion. The correct patient, procedure, and site was verified. Injection Procedure Details: Procedure Site One Meds Administered: Meds ordered this encounter Medications  methylPREDNISolone acetate (DEPO-MEDROL) injection 80 mg Laterality: Bilateral Location/Site: S1 Foramen Needle size: 22 ga. Needle type: Spinal Needle Placement: Transforaminal Findings:  -Comments: Excellent flow of contrast along the nerve, nerve root and into the epidural space. Epidurogram: Contrast epidurogram showed no nerve root cut off or restricted flow pattern. Procedure Details: After squaring off the sacral end-plate to get a true AP view, the C-arm was positioned so that the best possible view of the S1 foramen was visualized. The soft tissues overlying this structure were infiltrated with 2-3 ml. of 1% Lidocaine without Epinephrine. The spinal needle was inserted  toward the target using a "trajectory" view along the fluoroscope beam.  Under AP and lateral visualization, the needle was advanced so it did not puncture dura. Biplanar projections were used to confirm position. Aspiration was confirmed to be negative for CSF and/or blood. A 1-2 ml. volume of Isovue-250 was injected and flow of contrast was noted at each level. Radiographs were obtained for documentation purposes. After attaining the desired flow of contrast documented above, a 0.5 to 1.0 ml test dose of 0.25% Marcaine was injected into each respective transforaminal space.  The patient was observed for 90 seconds post injection.  After no sensory deficits were reported, and normal lower extremity motor function was noted,   the above injectate was administered  so that equal amounts of the injectate were placed at each foramen (level) into the transforaminal epidural space. Additional Comments: The patient tolerated the procedure well Dressing: Band-Aid with 2 x 2 sterile gauze  Post-procedure details: Patient was observed during the procedure. Post-procedure instructions were reviewed. Patient left the clinic in stable condition.      Assessment & Plan:   1. Vasomotor symptoms due to menopause HRT contraindicated due to prior TIA/stroke. Discussed various non-hormonal options. Not taking and not previously tolerated gabapentin. Feels bradycardic with current prn clonidine dosing. Increased anxiety and decreased mood - feels mostly related to familial issues but open to starting SNRI/SSRI for VSM. Also discussed VSM likely being worsened by poorly controlled hyperthyroidism and encourage follow up to manage thyroid.  - PARoxetine (PAXIL) 10 MG tablet; Take 1 tablet (10 mg total) by mouth daily.  Dispense: 30 tablet; Refill: 6   Routine preventative health maintenance measures emphasized.  Darliss Cheney, MD [facility]

## 2022-10-24 DIAGNOSIS — E0590 Thyrotoxicosis, unspecified without thyrotoxic crisis or storm: Secondary | ICD-10-CM | POA: Diagnosis not present

## 2022-10-24 DIAGNOSIS — M5442 Lumbago with sciatica, left side: Secondary | ICD-10-CM | POA: Diagnosis not present

## 2022-10-24 DIAGNOSIS — E1140 Type 2 diabetes mellitus with diabetic neuropathy, unspecified: Secondary | ICD-10-CM | POA: Diagnosis not present

## 2022-10-24 DIAGNOSIS — I10 Essential (primary) hypertension: Secondary | ICD-10-CM | POA: Diagnosis not present

## 2022-11-03 ENCOUNTER — Encounter: Payer: Self-pay | Admitting: Internal Medicine

## 2022-11-07 ENCOUNTER — Other Ambulatory Visit (HOSPITAL_COMMUNITY): Payer: Self-pay | Admitting: Internal Medicine

## 2022-11-07 DIAGNOSIS — E0590 Thyrotoxicosis, unspecified without thyrotoxic crisis or storm: Secondary | ICD-10-CM

## 2022-11-10 ENCOUNTER — Ambulatory Visit: Payer: PPO | Admitting: Obstetrics and Gynecology

## 2022-11-17 ENCOUNTER — Encounter (HOSPITAL_COMMUNITY)
Admission: RE | Admit: 2022-11-17 | Discharge: 2022-11-17 | Disposition: A | Discharge status: Home / Self Care | Payer: PPO | Source: Ambulatory Visit | Attending: Internal Medicine | Admitting: Internal Medicine

## 2022-11-17 DIAGNOSIS — E0590 Thyrotoxicosis, unspecified without thyrotoxic crisis or storm: Secondary | ICD-10-CM | POA: Diagnosis not present

## 2022-11-18 ENCOUNTER — Encounter (HOSPITAL_COMMUNITY)
Admission: RE | Admit: 2022-11-18 | Discharge: 2022-11-18 | Disposition: A | Discharge status: Home / Self Care | Payer: PPO | Source: Ambulatory Visit | Attending: Internal Medicine | Admitting: Internal Medicine

## 2022-11-29 DIAGNOSIS — E0590 Thyrotoxicosis, unspecified without thyrotoxic crisis or storm: Secondary | ICD-10-CM | POA: Diagnosis not present

## 2022-11-29 DIAGNOSIS — R634 Abnormal weight loss: Secondary | ICD-10-CM | POA: Diagnosis not present

## 2022-11-29 DIAGNOSIS — R197 Diarrhea, unspecified: Secondary | ICD-10-CM | POA: Diagnosis not present

## 2022-11-29 MED ORDER — SODIUM IODIDE I-123 7.4 MBQ CAPS
436.0000 | ORAL_CAPSULE | Freq: Once | ORAL | Status: AC
  Administered 2022-11-29: 436 via ORAL

## 2022-12-09 DIAGNOSIS — Z1152 Encounter for screening for COVID-19: Secondary | ICD-10-CM | POA: Diagnosis not present

## 2022-12-09 DIAGNOSIS — R110 Nausea: Secondary | ICD-10-CM | POA: Diagnosis not present

## 2022-12-09 DIAGNOSIS — J029 Acute pharyngitis, unspecified: Secondary | ICD-10-CM | POA: Diagnosis not present

## 2022-12-09 DIAGNOSIS — G43909 Migraine, unspecified, not intractable, without status migrainosus: Secondary | ICD-10-CM | POA: Diagnosis not present

## 2022-12-09 DIAGNOSIS — J101 Influenza due to other identified influenza virus with other respiratory manifestations: Secondary | ICD-10-CM | POA: Diagnosis not present

## 2022-12-09 DIAGNOSIS — R5383 Other fatigue: Secondary | ICD-10-CM | POA: Diagnosis not present

## 2022-12-09 DIAGNOSIS — R519 Headache, unspecified: Secondary | ICD-10-CM | POA: Diagnosis not present

## 2022-12-09 DIAGNOSIS — H9202 Otalgia, left ear: Secondary | ICD-10-CM | POA: Diagnosis not present

## 2022-12-15 DIAGNOSIS — E0590 Thyrotoxicosis, unspecified without thyrotoxic crisis or storm: Secondary | ICD-10-CM | POA: Diagnosis not present

## 2022-12-15 DIAGNOSIS — Z7689 Persons encountering health services in other specified circumstances: Secondary | ICD-10-CM | POA: Diagnosis not present

## 2023-01-16 ENCOUNTER — Telehealth: Payer: Self-pay | Admitting: Physical Medicine and Rehabilitation

## 2023-01-16 NOTE — Telephone Encounter (Signed)
Spoke with patient and she is requesting an injection. She stated the last injection in 08/2022 lasted maybe a month and a half and she had 70% relief. She is getting her thyroid out and is going to talk with her surgeon on 01/18/23 to see when she should get injection and will call us back

## 2023-01-16 NOTE — Telephone Encounter (Signed)
Patient need to set up appointment for pain mangement for injection [id number]

## 2023-01-18 ENCOUNTER — Other Ambulatory Visit: Payer: Self-pay | Admitting: Surgery

## 2023-01-18 ENCOUNTER — Ambulatory Visit: Payer: Self-pay | Admitting: Surgery

## 2023-01-18 DIAGNOSIS — E0520 Thyrotoxicosis with toxic multinodular goiter without thyrotoxic crisis or storm: Secondary | ICD-10-CM

## 2023-01-23 ENCOUNTER — Ambulatory Visit
Admission: RE | Admit: 2023-01-23 | Discharge: 2023-01-23 | Disposition: A | Discharge status: Home / Self Care | Payer: PPO | Source: Ambulatory Visit | Attending: Surgery | Admitting: Surgery

## 2023-01-23 DIAGNOSIS — E041 Nontoxic single thyroid nodule: Secondary | ICD-10-CM | POA: Diagnosis not present

## 2023-01-23 DIAGNOSIS — E0520 Thyrotoxicosis with toxic multinodular goiter without thyrotoxic crisis or storm: Secondary | ICD-10-CM

## 2023-01-27 NOTE — Progress Notes (Signed)
COVID Vaccine received:  '[]'$  No '[x]'$  Yes Date of any COVID positive Test in last 90 days: None  PCP - Leanna Battles MD Cardiologist - Skeet Latch, MD) PM&R- Magnus Sinning, MD  Chest x-ray -12-19-2021  EKG - 01-21-2022 epic    will repeat at PST  Stress Test -  ECHO - 05-28-2019  epic Cardiac Cath - 01-27-2015 at [facility]  PCR screen: '[x]'$  Ordered & Completed        Pt has Hx of MRSA                       '[]'$   No Order but Needs PROFEND                      '[]'$   N/A for this surgery  Surgery Plan:  '[]'$  Ambulatory                            '[x]'$  Outpatient in bed                            '[]'$  Admit  Anesthesia:    '[x]'$  General  '[]'$  Spinal                           '[]'$   Choice '[]'$   MAC  Pacemaker / ICD device '[x]'$  No '[]'$  Yes        Device order form faxed '[x]'$  No    '[]'$   Yes      Faxed to:  Spinal Cord Stimulator:'[x]'$  No '[]'$  Yes      (Remind patient to bring remote DOS) Other Implants:   History of Sleep Apnea? '[]'$  No '[x]'$  Yes   CPAP used?- '[x]'$  No '[]'$  Yes  can't tolerate  Does the patient monitor blood sugar? '[]'$  No '[]'$  Yes  '[x]'$  N/A    Diet Only no meds Last A1c: 05-28-2019? 6.1 Patient has: '[x]'$  Pre-DM   '[]'$  DM1  '[x]'$   DM2 Does patient have a [employer] or Dexacom? '[x]'$  No '[]'$  Yes   Fasting Blood Sugar Ranges-  Checks Blood Sugar _0_ times a day Diabetic medications/ instructions: none  Blood Thinner / Instructions:None Aspirin Instructions:ASA 81 mg; already on hold, none taken in the last month  ERAS Protocol Ordered: '[]'$  No  '[x]'$  Yes PRE-SURGERY '[]'$  ENSURE  '[]'$  G2  '[x]'$  No Drink Ordered Patient is to be NPO after: 07:45 am  Activity level: Patient is able to climb a flight of stairs without difficulty; '[x]'$  No CP  but would have SOB   Patient can perform ADLs without assistance.   Anesthesia review: DM2, OSA(no CPAP), HTN, Migraine, Chronic pain syndrome, HOH right ear, Hx TIA, PONV, very claustrophobic.   Patient denies shortness of breath, fever, cough and chest pain at PAT  appointment.  Patient verbalized understanding and agreement to the Pre-Surgical Instructions that were given to them at this PAT appointment. Patient was also educated of the need to review these PAT instructions again prior to his/her surgery.I reviewed the appropriate phone numbers to call if they have any and questions or concerns.

## 2023-01-27 NOTE — Patient Instructions (Signed)
SURGICAL WAITING ROOM VISITATION Patients having surgery or a procedure may have no more than 2 support people in the waiting area - these visitors may rotate in the visitor waiting room.   Due to an increase in RSV and influenza rates and associated hospitalizations, children ages 89 and under may not visit patients in [place]. If the patient needs to stay at the hospital during part of their recovery, the visitor guidelines for inpatient rooms apply.  PRE-OP VISITATION  Pre-op nurse will coordinate an appropriate time for 1 support person to accompany the patient in pre-op.  This support person may not rotate.  This visitor will be contacted when the time is appropriate for the visitor to come back in the pre-op area.  Please refer to the [facility] website for the visitor guidelines for Inpatients (after your surgery is over and you are in a regular room).  You are not required to quarantine at this time prior to your surgery. However, you must do this: Hand Hygiene often Do NOT share personal items Notify your provider if you are in close contact with someone who has COVID or you develop fever 100.4 or greater, new onset of sneezing, cough, sore throat, shortness of breath or body aches.  If you test positive for Covid or have been in contact with anyone that has tested positive in the last 10 days please notify you surgeon.    Your procedure is scheduled on:  Thursday  February 02, 2023  Report to [facility] Main Entrance: [address] entrance where the [employer] is available.   Report to admitting at: 08:30 AM  +++++Call this number if you have any questions or problems the morning of surgery [phone]  Do not eat food after Midnight the night prior to your surgery/procedure.  After Midnight you may have the following liquids until   07:45 AM DAY OF SURGERY  Clear Liquid Diet Water Black Coffee (sugar ok, NO MILK/CREAM OR CREAMERS)  Tea (sugar ok, NO  MILK/CREAM OR CREAMERS) regular and decaf                             Plain Jell-O  with no fruit (NO RED)                                           Fruit ices (not with fruit pulp, NO RED)                                     Popsicles (NO RED)                                                                  Juice: apple, WHITE grape, WHITE cranberry Sports drinks like Gatorade or Powerade (NO RED)                FOLLOW ANY ADDITIONAL PRE OP INSTRUCTIONS YOU RECEIVED FROM YOUR SURGEON'S OFFICE!!!   Oral Hygiene is also important to reduce your risk of infection.  Remember - BRUSH YOUR TEETH THE MORNING OF SURGERY WITH YOUR REGULAR TOOTHPASTE  Do NOT smoke after Midnight the night before surgery.  Take ONLY these medicines the morning of surgery with A SIP OF WATER: amlodipine, pantoprazole (Protonix), If needed you may take your Clondine (Catapress). You may use your Systane Eye drops if needed.                     You may not have any metal on your body including hair pins, jewelry, and body piercing  Do not wear make-up, lotions, powders, perfumes, or deodorant  Do not wear nail polish including gel and S&S, artificial / acrylic nails, or any other type of covering on natural nails including finger and toenails. If you have artificial nails, gel coating, etc., that needs to be removed by a nail salon, Please have this removed prior to surgery. Not doing so may mean that your surgery could be cancelled or delayed if the Surgeon or anesthesia staff feels like they are unable to monitor you safely.   Do not shave 48 hours prior to surgery to avoid nicks in your skin which may contribute to postoperative infections.   Contacts, Hearing Aids, dentures or bridgework may not be worn into surgery. DENTURES WILL BE REMOVED PRIOR TO SURGERY; PLEASE DO NOT APPLY "Poly grip" OR ADHESIVES!!!  You may bring a small overnight bag with you on the day of surgery, only pack items that are not  valuable. [facility] IS NOT RESPONSIBLE   FOR VALUABLES THAT ARE LOST OR STOLEN.   Do not bring your home medications to the hospital. The Pharmacy will dispense medications listed on your medication list to you during your admission in the Hospital.  Special Instructions: Bring a copy of your healthcare power of attorney and living will documents the day of surgery, if you wish to have them scanned into your [facility] Records- EPIC  Please read over the following fact sheets you were given: IF YOU HAVE QUESTIONS ABOUT YOUR [place], [place] [phone].    - Preparing for Surgery Before surgery, you can play an important role.  Because skin is not sterile, your skin needs to be as free of germs as possible.  You can reduce the number of germs on your skin by washing with CHG (chlorahexidine gluconate) soap before surgery.  CHG is an antiseptic cleaner which kills germs and bonds with the skin to continue killing germs even after washing. Please DO NOT use if you have an allergy to CHG or antibacterial soaps.  If your skin becomes reddened/irritated stop using the CHG and inform your nurse when you arrive at Short Stay. Do not shave (including legs and underarms) for at least 48 hours prior to the first CHG shower.  You may shave your face/neck.  Please follow these instructions carefully:  1.  Shower with CHG Soap the night before surgery and the  morning of surgery.  2.  If you choose to wash your hair, wash your hair first as usual with your normal  shampoo.  3.  After you shampoo, rinse your hair and body thoroughly to remove the shampoo.                             4.  Use CHG as you would any other liquid soap.  You can apply chg directly to the skin and wash.  Gently with a  scrungie or clean washcloth.  5.  Apply the CHG Soap to your body ONLY FROM THE NECK DOWN.   Do not use on face/ open                           Wound or open sores. Avoid contact  with eyes, ears mouth and genitals (private parts).                       Wash face,  Genitals (private parts) with your normal soap.             6.  Wash thoroughly, paying special attention to the area where your  surgery  will be performed.  7.  Thoroughly rinse your body with warm water from the neck down.  8.  DO NOT shower/wash with your normal soap after using and rinsing off the CHG Soap.            9.  Pat yourself dry with a clean towel.            10.  Wear clean pajamas.            11.  Place clean sheets on your bed the night of your first shower and do not  sleep with pets.  ON THE DAY OF SURGERY : Do not apply any lotions/deodorants the morning of surgery.  Please wear clean clothes to the hospital/surgery center.    FAILURE TO FOLLOW THESE INSTRUCTIONS MAY RESULT IN THE CANCELLATION OF YOUR SURGERY  PATIENT SIGNATURE_________________________________  NURSE SIGNATURE__________________________________  ________________________________________________________________________

## 2023-01-28 ENCOUNTER — Encounter: Payer: Self-pay | Admitting: Surgery

## 2023-01-28 DIAGNOSIS — E0520 Thyrotoxicosis with toxic multinodular goiter without thyrotoxic crisis or storm: Secondary | ICD-10-CM | POA: Diagnosis present

## 2023-01-28 NOTE — H&P (Signed)
REFERRING PHYSICIAN: Elise Benne  PROVIDER: Arsh Feutz Charlotta Newton, MD   Chief Complaint: New Consultation (Toxic multinodular goiter)  History of Present Illness:  Is referred by Dr. Leanna Battles for surgical evaluation and management of toxic multinodular thyroid goiter. Patient was diagnosed approximately 6 months ago. She began complaining of neck discomfort. She was having problems with dysphagia. She had intermittent hoarseness. She noted a tremor. She noted fatigue and occasional shortness of breath with exertion. Laboratory studies showed a suppressed TSH level. Patient had a trial of methimazole but this was poorly tolerated. She is on beta-blockade with metoprolol. Her most recent TSH level was low at 0.07. Patient underwent nuclear medicine uptake scan on November 17, 2022. This was suggestive of a toxic multinodular goiter with bilateral warm nodules noted. Patient has had no prior head or neck surgery. She has never been on thyroid medication prior to starting on methimazole and beta-blockade. There is a family history of medical thyroid disease in multiple immediate family members. There is no family history of thyroid malignancy. Patient presents today to discuss thyroid surgery. She is retired. She is accompanied by her husband.  Review of Systems: A complete review of systems was obtained from the patient. I have reviewed this information and discussed as appropriate with the patient. See HPI as well for other ROS.  Review of Systems Constitutional: Positive for malaise/fatigue. HENT: Positive for sore throat. Eyes: Negative. Respiratory: Positive for shortness of breath. Cardiovascular: Negative. Gastrointestinal: Negative. Genitourinary: Negative. Musculoskeletal: Positive for back pain. Skin: Negative. Neurological: Positive for tremors. Endo/Heme/Allergies: Negative. Psychiatric/Behavioral: The patient has insomnia.   Medical History: History  reviewed. No pertinent past medical history.  Patient Active Problem List Diagnosis Toxic multinodular goiter  History reviewed. No pertinent surgical history.  Allergies Allergen Reactions Hydromorphone Anaphylaxis [name] Penicillins Anaphylaxis, Hives, Itching, Rash and Swelling Other reaction(s): Unknown  Has patient had a PCN reaction causing immediate rash, facial/tongue/throat swelling, SOB or lightheadedness with hypotension: Yes Has patient had a PCN reaction causing severe rash involving mucus membranes or skin necrosis: No Has patient had a PCN reaction that required hospitalization Yes Has patient had a PCN reaction occurring within the last 10 years: No If all of the above answers are "NO", then may proceed with Cephalosporin use.  Current Outpatient Medications on File Prior to Visit Medication Sig Dispense Refill amLODIPine (NORVASC) 5 MG tablet Take 5 mg by mouth once daily metoprolol succinate (TOPROL-XL) 100 MG XL tablet topiramate (TOPAMAX) 100 MG tablet Take by mouth cyclobenzaprine (FLEXERIL) 10 MG tablet Take by mouth at bedtime as needed gabapentin (NEURONTIN) 100 MG capsule Take 100 mg by mouth as needed HYDROcodone-acetaminophen (NORCO) 5-325 mg tablet Take 1 tablet by mouth as needed pantoprazole (PROTONIX) 40 MG DR tablet Take 40 mg by mouth 2 (two) times daily PARoxetine (PAXIL) 10 MG tablet Take 10 mg by mouth once daily  No current facility-administered medications on file prior to visit.  History reviewed. No pertinent family history.  Social History  Tobacco Use Smoking Status Not on file Smokeless Tobacco Not on file   Social History  Socioeconomic History Marital status: Married  Objective:  Vitals: BP: 132/86 Pulse: 62 Temp: 36.9 C (98.5 F) SpO2: 98% Weight: 85.9 kg (189 lb 6.4 oz) Height: 170.2 cm ('5\' 7"'$ ) PainSc: 0-No pain  Body mass index is 29.66 kg/m.  Physical Exam  GENERAL  APPEARANCE Comfortable, no acute issues Development: normal Gross deformities: none  SKIN Rash, lesions,  ulcers: none Induration, erythema: none Nodules: none palpable  EYES Conjunctiva and lids: normal Pupils: equal and reactive  EARS, NOSE, MOUTH, THROAT External ears: no lesion or deformity External nose: no lesion or deformity Hearing: grossly normal  NECK Symmetric: yes Trachea: midline Thyroid: Thyroid gland is not enlarged. It is mildly nodular to palpation. There is mild tenderness to palpation. There does not appear to be any associated lymphadenopathy.  CHEST Respiratory effort: normal Retraction or accessory muscle use: no Breath sounds: normal bilaterally Rales, rhonchi, wheeze: none  CARDIOVASCULAR Auscultation: regular rhythm, normal rate Murmurs: none Pulses: radial pulse 2+ palpable Lower extremity edema: none  ABDOMEN Not assessed  GENITOURINARY/RECTAL Not assessed  MUSCULOSKELETAL Station and gait: normal Digits and nails: no clubbing or cyanosis Muscle strength: grossly normal all extremities Range of motion: grossly normal all extremities Deformity: Bilateral tremor  LYMPHATIC Cervical: none palpable Supraclavicular: none palpable  PSYCHIATRIC Oriented to person, place, and time: yes Mood and affect: normal for situation Judgment and insight: appropriate for situation   Assessment and Plan:  Toxic multinodular goiter  Patient is referred by her primary care physician for surgical evaluation and management of a toxic multinodular goiter.  Patient provided with a copy of "The Thyroid Book: Medical and Surgical Treatment of Thyroid Problems", published by [employer], 16 pages. Book reviewed and explained to patient during visit today.  Today we reviewed her clinical history. We reviewed her laboratory studies and her imaging studies. We discussed treatment options for hyperthyroidism in the setting of multiple nodules. I have recommended  proceeding with total thyroidectomy. We discussed the procedure in detail. We discussed the size and location of the surgical incision. We discussed the risk of surgery including the risk of recurrent laryngeal nerve injury and injury to parathyroid glands. We discussed the hospital stay to be anticipated. We discussed the need for lifelong thyroid hormone replacement. We discussed the postoperative recovery and return to activities. The patient understands and wishes to proceed with surgery in the near future.  We will obtain an ultrasound examination of the neck prior to surgery.  [name], MD [facility] [place] practice Office: [phone]

## 2023-01-30 ENCOUNTER — Other Ambulatory Visit: Payer: Self-pay

## 2023-01-30 ENCOUNTER — Encounter (HOSPITAL_COMMUNITY)
Admission: RE | Admit: 2023-01-30 | Discharge: 2023-01-30 | Disposition: A | Discharge status: Home / Self Care | Payer: PPO | Source: Ambulatory Visit | Attending: Surgery | Admitting: Surgery

## 2023-01-30 ENCOUNTER — Encounter (HOSPITAL_COMMUNITY): Payer: Self-pay

## 2023-01-30 VITALS — BP 159/89 | HR 52 | Temp 97.9°F | Resp 16 | Ht 67.0 in | Wt 180.0 lb

## 2023-01-30 DIAGNOSIS — I10 Essential (primary) hypertension: Secondary | ICD-10-CM | POA: Diagnosis not present

## 2023-01-30 DIAGNOSIS — Z01818 Encounter for other preprocedural examination: Secondary | ICD-10-CM | POA: Diagnosis not present

## 2023-01-30 DIAGNOSIS — Z22322 Carrier or suspected carrier of Methicillin resistant Staphylococcus aureus: Secondary | ICD-10-CM | POA: Diagnosis not present

## 2023-01-30 DIAGNOSIS — E119 Type 2 diabetes mellitus without complications: Secondary | ICD-10-CM | POA: Diagnosis not present

## 2023-01-30 HISTORY — DX: Anxiety disorder, unspecified: F41.9

## 2023-01-30 LAB — CBC
HCT: 44.8 % (ref 36.0–46.0)
Hemoglobin: 14.4 g/dL (ref 12.0–15.0)
MCH: 29.9 pg (ref 26.0–34.0)
MCHC: 32.1 g/dL (ref 30.0–36.0)
MCV: 93.1 fL (ref 80.0–100.0)
Platelets: 299 10*3/uL (ref 150–400)
RBC: 4.81 MIL/uL (ref 3.87–5.11)
RDW: 12.9 % (ref 11.5–15.5)
WBC: 8.7 10*3/uL (ref 4.0–10.5)
nRBC: 0 % (ref 0.0–0.2)

## 2023-01-30 LAB — COMPREHENSIVE METABOLIC PANEL
ALT: 13 U/L (ref 0–44)
AST: 21 U/L (ref 15–41)
Albumin: 4.3 g/dL (ref 3.5–5.0)
Alkaline Phosphatase: 93 U/L (ref 38–126)
Anion gap: 9 (ref 5–15)
BUN: 15 mg/dL (ref 8–23)
CO2: 24 mmol/L (ref 22–32)
Calcium: 9.1 mg/dL (ref 8.9–10.3)
Chloride: 107 mmol/L (ref 98–111)
Creatinine, Ser: 1.31 mg/dL — ABNORMAL HIGH (ref 0.44–1.00)
GFR, Estimated: 45 mL/min — ABNORMAL LOW (ref >60)
Glucose, Bld: 106 mg/dL — ABNORMAL HIGH (ref 70–99)
Potassium: 4.2 mmol/L (ref 3.5–5.1)
Sodium: 140 mmol/L (ref 135–145)
Total Bilirubin: 0.5 mg/dL (ref 0.3–1.2)
Total Protein: 7.6 g/dL (ref 6.5–8.1)

## 2023-01-30 LAB — SURGICAL PCR SCREEN
MRSA, PCR: NEGATIVE
Staphylococcus aureus: NEGATIVE

## 2023-01-30 LAB — GLUCOSE, CAPILLARY: Glucose-Capillary: 118 mg/dL — ABNORMAL HIGH (ref 70–99)

## 2023-01-31 LAB — HEMOGLOBIN A1C
Hgb A1c MFr Bld: 6.2 % — ABNORMAL HIGH (ref 4.8–5.6)
Mean Plasma Glucose: 131 mg/dL

## 2023-02-02 ENCOUNTER — Ambulatory Visit (HOSPITAL_COMMUNITY)
Admission: RE | Admit: 2023-02-02 | Discharge: 2023-02-03 | Disposition: A | Discharge status: Home / Self Care | Payer: PPO | Source: Ambulatory Visit | Attending: Surgery | Admitting: Surgery

## 2023-02-02 ENCOUNTER — Other Ambulatory Visit: Payer: Self-pay

## 2023-02-02 ENCOUNTER — Encounter (HOSPITAL_COMMUNITY): Payer: Self-pay | Admitting: Surgery

## 2023-02-02 ENCOUNTER — Encounter (HOSPITAL_COMMUNITY)
Admission: RE | Disposition: A | Discharge status: Home / Self Care | Payer: Self-pay | Source: Ambulatory Visit | Attending: Surgery

## 2023-02-02 ENCOUNTER — Ambulatory Visit (HOSPITAL_BASED_OUTPATIENT_CLINIC_OR_DEPARTMENT_OTHER): Payer: PPO | Admitting: Certified Registered Nurse Anesthetist

## 2023-02-02 ENCOUNTER — Ambulatory Visit (HOSPITAL_COMMUNITY): Payer: PPO | Admitting: Certified Registered Nurse Anesthetist

## 2023-02-02 DIAGNOSIS — Z22322 Carrier or suspected carrier of Methicillin resistant Staphylococcus aureus: Secondary | ICD-10-CM

## 2023-02-02 DIAGNOSIS — I10 Essential (primary) hypertension: Secondary | ICD-10-CM

## 2023-02-02 DIAGNOSIS — E039 Hypothyroidism, unspecified: Secondary | ICD-10-CM | POA: Diagnosis not present

## 2023-02-02 DIAGNOSIS — Z79899 Other long term (current) drug therapy: Secondary | ICD-10-CM | POA: Insufficient documentation

## 2023-02-02 DIAGNOSIS — G4730 Sleep apnea, unspecified: Secondary | ICD-10-CM

## 2023-02-02 DIAGNOSIS — E0520 Thyrotoxicosis with toxic multinodular goiter without thyrotoxic crisis or storm: Secondary | ICD-10-CM | POA: Diagnosis present

## 2023-02-02 DIAGNOSIS — E119 Type 2 diabetes mellitus without complications: Secondary | ICD-10-CM

## 2023-02-02 DIAGNOSIS — Z8673 Personal history of transient ischemic attack (TIA), and cerebral infarction without residual deficits: Secondary | ICD-10-CM | POA: Diagnosis not present

## 2023-02-02 HISTORY — PX: THYROIDECTOMY: SHX17

## 2023-02-02 LAB — GLUCOSE, CAPILLARY
Glucose-Capillary: 117 mg/dL — ABNORMAL HIGH (ref 70–99)
Glucose-Capillary: 127 mg/dL — ABNORMAL HIGH (ref 70–99)

## 2023-02-02 SURGERY — THYROIDECTOMY
Anesthesia: General | Site: Neck

## 2023-02-02 MED ORDER — SCOPOLAMINE 1 MG/3DAYS TD PT72
MEDICATED_PATCH | TRANSDERMAL | Status: AC
  Filled 2023-02-02: qty 1

## 2023-02-02 MED ORDER — SODIUM CHLORIDE 0.45 % IV SOLN: INTRAVENOUS | Status: DC

## 2023-02-02 MED ORDER — ONDANSETRON HCL 4 MG/2ML IJ SOLN
INTRAMUSCULAR | Status: AC
  Filled 2023-02-02: qty 2

## 2023-02-02 MED ORDER — DEXAMETHASONE SODIUM PHOSPHATE 10 MG/ML IJ SOLN
INTRAMUSCULAR | Status: AC
  Filled 2023-02-02: qty 1

## 2023-02-02 MED ORDER — EPHEDRINE SULFATE-NACL 50-0.9 MG/10ML-% IV SOSY
PREFILLED_SYRINGE | INTRAVENOUS | Status: DC | PRN
  Administered 2023-02-02 (×2): 10 mg via INTRAVENOUS
  Administered 2023-02-02: 5 mg via INTRAVENOUS

## 2023-02-02 MED ORDER — PANTOPRAZOLE SODIUM 40 MG PO TBEC
40.0000 mg | DELAYED_RELEASE_TABLET | Freq: Two times a day (BID) | ORAL | Status: DC
  Administered 2023-02-02 – 2023-02-03 (×2): 40 mg via ORAL
  Filled 2023-02-02 (×2): qty 1

## 2023-02-02 MED ORDER — DROPERIDOL 2.5 MG/ML IJ SOLN
INTRAMUSCULAR | Status: DC | PRN
  Administered 2023-02-02: .625 mg via INTRAVENOUS

## 2023-02-02 MED ORDER — ONDANSETRON HCL 4 MG/2ML IJ SOLN
INTRAMUSCULAR | Status: DC | PRN
  Administered 2023-02-02: 4 mg via INTRAVENOUS

## 2023-02-02 MED ORDER — PROPOFOL 10 MG/ML IV BOLUS
INTRAVENOUS | Status: AC
  Filled 2023-02-02: qty 20

## 2023-02-02 MED ORDER — HEMOSTATIC AGENTS (NO CHARGE) OPTIME
TOPICAL | Status: DC | PRN
  Administered 2023-02-02: 1 via TOPICAL

## 2023-02-02 MED ORDER — LIDOCAINE 2% (20 MG/ML) 5 ML SYRINGE
INTRAMUSCULAR | Status: DC | PRN
  Administered 2023-02-02: 100 mg via INTRAVENOUS

## 2023-02-02 MED ORDER — CHLORHEXIDINE GLUCONATE 0.12 % MT SOLN
15.0000 mL | Freq: Once | OROMUCOSAL | Status: AC
  Administered 2023-02-02: 15 mL via OROMUCOSAL

## 2023-02-02 MED ORDER — MORPHINE SULFATE (PF) 4 MG/ML IV SOLN
INTRAVENOUS | Status: AC
  Administered 2023-02-02: 2 mg
  Filled 2023-02-02: qty 1

## 2023-02-02 MED ORDER — PROPOFOL 10 MG/ML IV BOLUS
INTRAVENOUS | Status: DC | PRN
  Administered 2023-02-02: 160 mg via INTRAVENOUS

## 2023-02-02 MED ORDER — CIPROFLOXACIN IN D5W 400 MG/200ML IV SOLN
400.0000 mg | INTRAVENOUS | Status: AC
  Administered 2023-02-02: 400 mg via INTRAVENOUS
  Filled 2023-02-02: qty 200

## 2023-02-02 MED ORDER — MORPHINE SULFATE (PF) 2 MG/ML IV SOLN
1.0000 mg | INTRAVENOUS | Status: DC | PRN
  Administered 2023-02-02 (×2): 2 mg via INTRAVENOUS
  Administered 2023-02-02: 1 mg via INTRAVENOUS
  Administered 2023-02-02 (×3): 2 mg via INTRAVENOUS

## 2023-02-02 MED ORDER — CALCIUM CARBONATE 1250 (500 CA) MG PO TABS
2.0000 | ORAL_TABLET | Freq: Three times a day (TID) | ORAL | Status: DC
  Administered 2023-02-02 – 2023-02-03 (×2): 2500 mg via ORAL
  Filled 2023-02-02 (×3): qty 2

## 2023-02-02 MED ORDER — MIDAZOLAM HCL 5 MG/5ML IJ SOLN
INTRAMUSCULAR | Status: DC | PRN
  Administered 2023-02-02: 2 mg via INTRAVENOUS

## 2023-02-02 MED ORDER — LACTATED RINGERS IV SOLN: INTRAVENOUS | Status: DC

## 2023-02-02 MED ORDER — TOPIRAMATE 100 MG PO TABS
100.0000 mg | ORAL_TABLET | Freq: Every day | ORAL | Status: DC
  Administered 2023-02-02: 100 mg via ORAL
  Filled 2023-02-02: qty 1

## 2023-02-02 MED ORDER — ONDANSETRON 4 MG PO TBDP
4.0000 mg | ORAL_TABLET | Freq: Four times a day (QID) | ORAL | Status: DC | PRN
  Administered 2023-02-03: 4 mg via ORAL
  Filled 2023-02-02: qty 1

## 2023-02-02 MED ORDER — ROCURONIUM BROMIDE 10 MG/ML (PF) SYRINGE
PREFILLED_SYRINGE | INTRAVENOUS | Status: DC | PRN
  Administered 2023-02-02: 60 mg via INTRAVENOUS

## 2023-02-02 MED ORDER — EPHEDRINE 5 MG/ML INJ
INTRAVENOUS | Status: AC
  Filled 2023-02-02: qty 10

## 2023-02-02 MED ORDER — POTASSIUM CHLORIDE CRYS ER 20 MEQ PO TBCR
20.0000 meq | EXTENDED_RELEASE_TABLET | Freq: Every day | ORAL | Status: DC
  Administered 2023-02-03: 20 meq via ORAL
  Filled 2023-02-02: qty 1

## 2023-02-02 MED ORDER — OXYCODONE HCL 5 MG/5ML PO SOLN: 5.0000 mg | Freq: Once | ORAL | Status: AC | PRN

## 2023-02-02 MED ORDER — HYDRALAZINE HCL 20 MG/ML IJ SOLN
INTRAMUSCULAR | Status: DC | PRN
  Administered 2023-02-02: 2 mg via INTRAVENOUS
  Administered 2023-02-02 (×2): 5 mg via INTRAVENOUS

## 2023-02-02 MED ORDER — AMLODIPINE BESYLATE 5 MG PO TABS
5.0000 mg | ORAL_TABLET | Freq: Every day | ORAL | Status: DC
  Administered 2023-02-03: 5 mg via ORAL
  Filled 2023-02-02: qty 1

## 2023-02-02 MED ORDER — ORAL CARE MOUTH RINSE: 15.0000 mL | Freq: Once | OROMUCOSAL | Status: AC

## 2023-02-02 MED ORDER — CHLORHEXIDINE GLUCONATE CLOTH 2 % EX PADS: 6.0000 | MEDICATED_PAD | Freq: Once | CUTANEOUS | Status: DC

## 2023-02-02 MED ORDER — METOPROLOL SUCCINATE ER 50 MG PO TB24
100.0000 mg | ORAL_TABLET | Freq: Two times a day (BID) | ORAL | Status: DC
  Administered 2023-02-02 – 2023-02-03 (×2): 100 mg via ORAL
  Filled 2023-02-02 (×2): qty 2

## 2023-02-02 MED ORDER — FENTANYL CITRATE (PF) 100 MCG/2ML IJ SOLN
INTRAMUSCULAR | Status: AC
  Filled 2023-02-02: qty 2

## 2023-02-02 MED ORDER — OXYCODONE HCL 5 MG/5ML PO SOLN
ORAL | Status: AC
  Administered 2023-02-02: 5 mg via ORAL
  Filled 2023-02-02: qty 5

## 2023-02-02 MED ORDER — ROCURONIUM BROMIDE 10 MG/ML (PF) SYRINGE
PREFILLED_SYRINGE | INTRAVENOUS | Status: AC
  Filled 2023-02-02: qty 10

## 2023-02-02 MED ORDER — DEXAMETHASONE SODIUM PHOSPHATE 10 MG/ML IJ SOLN
INTRAMUSCULAR | Status: DC | PRN
  Administered 2023-02-02: 8 mg via INTRAVENOUS

## 2023-02-02 MED ORDER — FENTANYL CITRATE (PF) 100 MCG/2ML IJ SOLN
INTRAMUSCULAR | Status: DC | PRN
  Administered 2023-02-02 (×4): 50 ug via INTRAVENOUS

## 2023-02-02 MED ORDER — MORPHINE SULFATE (PF) 2 MG/ML IV SOLN
INTRAVENOUS | Status: AC
  Administered 2023-02-02: 1 mg via INTRAVENOUS
  Filled 2023-02-02: qty 1

## 2023-02-02 MED ORDER — MORPHINE SULFATE (PF) 2 MG/ML IV SOLN
INTRAVENOUS | Status: AC
  Filled 2023-02-02: qty 1

## 2023-02-02 MED ORDER — OXYCODONE HCL 5 MG PO TABS
5.0000 mg | ORAL_TABLET | ORAL | Status: DC | PRN
  Administered 2023-02-02 (×2): 5 mg via ORAL
  Administered 2023-02-03 (×3): 10 mg via ORAL
  Filled 2023-02-02 (×2): qty 2
  Filled 2023-02-02: qty 1
  Filled 2023-02-02: qty 2
  Filled 2023-02-02: qty 1

## 2023-02-02 MED ORDER — TRAMADOL HCL 50 MG PO TABS
50.0000 mg | ORAL_TABLET | Freq: Four times a day (QID) | ORAL | Status: DC | PRN
  Administered 2023-02-02: 50 mg via ORAL
  Filled 2023-02-02: qty 1

## 2023-02-02 MED ORDER — 0.9 % SODIUM CHLORIDE (POUR BTL) OPTIME
TOPICAL | Status: DC | PRN
  Administered 2023-02-02: 1000 mL

## 2023-02-02 MED ORDER — LIDOCAINE HCL (PF) 2 % IJ SOLN
INTRAMUSCULAR | Status: AC
  Filled 2023-02-02: qty 5

## 2023-02-02 MED ORDER — OXYCODONE HCL 5 MG PO TABS: 5.0000 mg | ORAL_TABLET | Freq: Once | ORAL | Status: AC | PRN

## 2023-02-02 MED ORDER — MIDAZOLAM HCL 2 MG/2ML IJ SOLN
INTRAMUSCULAR | Status: AC
  Filled 2023-02-02: qty 2

## 2023-02-02 MED ORDER — ACETAMINOPHEN 650 MG RE SUPP: 650.0000 mg | Freq: Four times a day (QID) | RECTAL | Status: DC | PRN

## 2023-02-02 MED ORDER — ONDANSETRON HCL 4 MG/2ML IJ SOLN
4.0000 mg | Freq: Four times a day (QID) | INTRAMUSCULAR | Status: DC | PRN
  Administered 2023-02-03: 4 mg via INTRAVENOUS
  Filled 2023-02-02: qty 2

## 2023-02-02 MED ORDER — ACETAMINOPHEN 325 MG PO TABS
650.0000 mg | ORAL_TABLET | Freq: Four times a day (QID) | ORAL | Status: DC | PRN
  Administered 2023-02-02: 650 mg via ORAL
  Filled 2023-02-02: qty 2

## 2023-02-02 MED ORDER — ONDANSETRON HCL 4 MG/2ML IJ SOLN: 4.0000 mg | Freq: Once | INTRAMUSCULAR | Status: DC | PRN

## 2023-02-02 MED ORDER — SUGAMMADEX SODIUM 200 MG/2ML IV SOLN
INTRAVENOUS | Status: DC | PRN
  Administered 2023-02-02: 200 mg via INTRAVENOUS

## 2023-02-02 SURGICAL SUPPLY — 32 items
ADH SKN CLS APL DERMABOND .7 (GAUZE/BANDAGES/DRESSINGS) ×1
APL PRP STRL LF DISP 70% ISPRP (MISCELLANEOUS) ×1
ATTRACTOMAT 16X20 MAGNETIC DRP (DRAPES) ×2 IMPLANT
BAG COUNTER SPONGE SURGICOUNT (BAG) ×2 IMPLANT
BAG SPNG CNTER NS LX DISP (BAG) ×1
BLADE SURG 15 STRL LF DISP TIS (BLADE) ×2 IMPLANT
BLADE SURG 15 STRL SS (BLADE) ×1
CHLORAPREP W/TINT 26 (MISCELLANEOUS) ×2 IMPLANT
CLIP TI MEDIUM 6 (CLIP) ×4 IMPLANT
CLIP TI WIDE RED SMALL 6 (CLIP) ×4 IMPLANT
COVER SURGICAL LIGHT HANDLE (MISCELLANEOUS) ×2 IMPLANT
DERMABOND ADVANCED .7 DNX12 (GAUZE/BANDAGES/DRESSINGS) ×2 IMPLANT
DRAPE LAPAROTOMY T 98X78 PEDS (DRAPES) ×2 IMPLANT
DRAPE UTILITY XL STRL (DRAPES) ×2 IMPLANT
ELECT PENCIL ROCKER SW 15FT (MISCELLANEOUS) ×2 IMPLANT
ELECT REM PT RETURN 15FT ADLT (MISCELLANEOUS) ×2 IMPLANT
GAUZE 4X4 16PLY ~~LOC~~+RFID DBL (SPONGE) ×2 IMPLANT
GLOVE SURG ORTHO 8.0 STRL STRW (GLOVE) ×2 IMPLANT
GOWN STRL REUS W/ TWL XL LVL3 (GOWN DISPOSABLE) ×4 IMPLANT
GOWN STRL REUS W/TWL XL LVL3 (GOWN DISPOSABLE) ×2
HEMOSTAT SURGICEL 2X4 FIBR (HEMOSTASIS) ×2 IMPLANT
ILLUMINATOR WAVEGUIDE N/F (MISCELLANEOUS) ×2 IMPLANT
KIT BASIN OR (CUSTOM PROCEDURE TRAY) ×2 IMPLANT
KIT TURNOVER KIT A (KITS) IMPLANT
PACK BASIC VI WITH GOWN DISP (CUSTOM PROCEDURE TRAY) ×2 IMPLANT
SHEARS HARMONIC 9CM CVD (BLADE) ×2 IMPLANT
SUT MNCRL AB 4-0 PS2 18 (SUTURE) ×2 IMPLANT
SUT VIC AB 3-0 SH 18 (SUTURE) ×4 IMPLANT
SYR BULB IRRIG 60ML STRL (SYRINGE) ×2 IMPLANT
TOWEL OR 17X26 10 PK STRL BLUE (TOWEL DISPOSABLE) ×2 IMPLANT
TOWEL OR NON WOVEN STRL DISP B (DISPOSABLE) ×2 IMPLANT
TUBING CONNECTING 10 (TUBING) ×2 IMPLANT

## 2023-02-02 NOTE — Addendum Note (Signed)
Addendum  created 02/02/23 1717 by West Pugh, CRNA   Intraprocedure Event edited

## 2023-02-02 NOTE — Op Note (Signed)
Procedure Note  Pre-operative Diagnosis:  toxic multinodular thyroid goiter  Post-operative Diagnosis:  same  Surgeon:  [name], MD  Assistant:  Neysa Bonito, MD   Procedure:  Total thyroidectomy  Anesthesia:  General  Estimated Blood Loss:  minimal  Drains: none         Specimen: thyroid to pathology  Indications:  Is referred by Dr. Leanna Battles for surgical evaluation and management of toxic multinodular thyroid goiter. Patient was diagnosed approximately 6 months ago. She began complaining of neck discomfort. She was having problems with dysphagia. She had intermittent hoarseness. She noted a tremor. She noted fatigue and occasional shortness of breath with exertion. Laboratory studies showed a suppressed TSH level. Patient had a trial of methimazole but this was poorly tolerated. She is on beta-blockade with metoprolol. Her most recent TSH level was low at 0.07. Patient underwent nuclear medicine uptake scan on November 17, 2022. This was suggestive of a toxic multinodular goiter with bilateral warm nodules noted. Patient has had no prior head or neck surgery. She has never been on thyroid medication prior to starting on methimazole and beta-blockade. There is a family history of medical thyroid disease in multiple immediate family members. There is no family history of thyroid malignancy. Patient presents today to discuss thyroid surgery. She is retired. She is accompanied by her husband.   Procedure Details: Procedure was done in OR #2 at the [facility]. The patient was brought to the operating room and placed in a supine position on the operating room table. Following administration of general anesthesia, the patient was positioned and then prepped and draped in the usual aseptic fashion. After ascertaining that an adequate level of anesthesia had been achieved, a small Kocher incision was made with #15 blade. Dissection was carried through subcutaneous tissues and  platysma.Hemostasis was achieved with the electrocautery. Skin flaps were elevated cephalad and caudad from the thyroid notch to the sternal notch. A Mahorner self-retaining retractor was placed for exposure. Strap muscles were incised in the midline and dissection was begun on the left side.  Strap muscles were reflected laterally.  Left thyroid lobe was small and firm with small nodules.  The left lobe was gently mobilized with blunt dissection. Superior pole vessels were dissected out and divided individually between small and medium ligaclips with the harmonic scalpel. The thyroid lobe was rolled anteriorly. Branches of the inferior thyroid artery were divided between small ligaclips with the harmonic scalpel. Inferior venous tributaries were divided between ligaclips. Both the superior and inferior parathyroid glands were identified and preserved on their vascular pedicles. The recurrent laryngeal nerve was identified and preserved along its course. The ligament of Gwenlyn Found was released with the electrocautery and the gland was mobilized onto the anterior trachea. Isthmus was mobilized across the midline. There was a tiny pyramidal lobe present which was resected with the isthmus. Dry pack was placed in the left neck.  The right thyroid lobe was gently mobilized with blunt dissection. Right thyroid lobe was mildly enlarged and nodular. Superior pole vessels were dissected out and divided between small and medium ligaclips with the Harmonic scalpel. Superior parathyroid was identified and preserved. Inferior venous tributaries were divided between medium ligaclips with the harmonic scalpel. The right thyroid lobe was rolled anteriorly and the branches of the inferior thyroid artery divided between small ligaclips. The right recurrent laryngeal nerve was identified and preserved along its course. The ligament of Gwenlyn Found was released with the electrocautery. The right thyroid lobe was mobilized  onto the anterior  trachea and the remainder of the thyroid was dissected off the anterior trachea and the thyroid was completely excised. A suture was used to mark the right lobe. The entire thyroid gland was submitted to pathology for review.  Palpation of the operative field demonstrated no evidence of residual disease and no abnormal lymph nodes.  The neck was irrigated with warm saline. Fibrillar was placed throughout the operative field. Strap muscles were approximated in the midline with interrupted 3-0 Vicryl sutures. Platysma was closed with interrupted 3-0 Vicryl sutures. Skin was closed with a running 4-0 Monocryl subcuticular suture. Wound was washed and Dermabond was applied. The patient was awakened from anesthesia and brought to the recovery room. The patient tolerated the procedure well.   [name], [place] [facility] Office: [phone]

## 2023-02-02 NOTE — Transfer of Care (Signed)
Immediate Anesthesia Transfer of Care Note  [name]  Procedure(s) Performed: TOTAL THYROIDECTOMY (Neck)  Patient Location: PACU  Anesthesia Type:General  Level of Consciousness: drowsy and patient cooperative  Airway & Oxygen Therapy: Patient Spontanous Breathing and Patient connected to face mask oxygen  Post-op Assessment: Report given to RN and Post -op Vital signs reviewed and stable  Post vital signs: Reviewed and stable  Last Vitals:  Vitals Value Taken Time  BP 170/79 02/02/23 1253  Temp    Pulse 67 02/02/23 1257  Resp 18 02/02/23 1257  SpO2 95 % 02/02/23 1257  Vitals shown include unvalidated device data.  Last Pain:  Vitals:   02/02/23 0831  TempSrc:   PainSc: 7          Complications: No notable events documented.

## 2023-02-02 NOTE — Interval H&P Note (Signed)
History and Physical Interval Note:  02/02/2023 10:52 AM  Natalie Bond  has presented today for surgery, with the diagnosis of TOXIC MULTINODULAR GOITER.  The various methods of treatment have been discussed with the patient and family. After consideration of risks, benefits and other options for treatment, the patient has consented to    Procedure(s): TOTAL THYROIDECTOMY (N/A) as a surgical intervention.    The patient's history has been reviewed, patient examined, no change in status, stable for surgery.  I have reviewed the patient's chart and labs.  Questions were answered to the patient's satisfaction.    [name], [facility] [place] practice Office: [place]

## 2023-02-02 NOTE — Anesthesia Postprocedure Evaluation (Signed)
Anesthesia Post Note  [name]  Procedure(s) Performed: TOTAL THYROIDECTOMY (Neck)     Patient location during evaluation: PACU Anesthesia Type: General Level of consciousness: awake and alert Pain management: pain level controlled Vital Signs Assessment: post-procedure vital signs reviewed and stable Respiratory status: spontaneous breathing, nonlabored ventilation, respiratory function stable and patient connected to nasal cannula oxygen Cardiovascular status: blood pressure returned to baseline and stable Postop Assessment: no apparent nausea or vomiting Anesthetic complications: no  No notable events documented.  Last Vitals:  Vitals:   02/02/23 1315 02/02/23 1330  BP: (!) 157/87 (!) 177/100  Pulse: 74 77  Resp: 15 (!) 21  Temp:    SpO2: 96% 93%    Last Pain:  Vitals:   02/02/23 1330  TempSrc:   PainSc: 8                  Darcey Cardy S

## 2023-02-02 NOTE — Anesthesia Procedure Notes (Addendum)
Procedure Name: Intubation Date/Time: 02/02/2023 11:14 AM  Performed by: West Pugh, CRNAPre-anesthesia Checklist: Patient identified, Emergency Drugs available, Suction available, Patient being monitored and Timeout performed Patient Re-evaluated:Patient Re-evaluated prior to induction Oxygen Delivery Method: Circle system utilized Preoxygenation: Pre-oxygenation with 100% oxygen Induction Type: IV induction Ventilation: Mask ventilation without difficulty Laryngoscope Size: Mac and 3 Grade View: Grade I Tube type: Oral Tube size: 7.0 mm Number of attempts: 1 Airway Equipment and Method: Stylet Placement Confirmation: ETT inserted through vocal cords under direct vision, positive ETCO2, CO2 detector and breath sounds checked- equal and bilateral Secured at: 21 cm Tube secured with: Tape Dental Injury: Teeth and Oropharynx as per pre-operative assessment

## 2023-02-02 NOTE — Anesthesia Preprocedure Evaluation (Signed)
Anesthesia Evaluation  Patient identified by MRN, date of birth, ID band Patient awake    Reviewed: Allergy & Precautions, H&P , NPO status , Patient's Chart, lab work & pertinent test results  History of Anesthesia Complications (+) PONV and history of anesthetic complications  Airway Mallampati: II  TM Distance: >3 FB Neck ROM: Full    Dental no notable dental hx.    Pulmonary sleep apnea    Pulmonary exam normal breath sounds clear to auscultation       Cardiovascular hypertension, Normal cardiovascular exam Rhythm:Regular Rate:Normal     Neuro/Psych TIA negative psych ROS   GI/Hepatic Neg liver ROS,GERD  ,,  Endo/Other  Hypothyroidism Hyperthyroidism   Renal/GU negative Renal ROS  negative genitourinary   Musculoskeletal negative musculoskeletal ROS (+)    Abdominal   Peds negative pediatric ROS (+)  Hematology negative hematology ROS (+)   Anesthesia Other Findings   Reproductive/Obstetrics negative OB ROS                             Anesthesia Physical Anesthesia Plan  ASA: 3  Anesthesia Plan: General   Post-op Pain Management: Tylenol PO (pre-op)*   Induction: Intravenous  PONV Risk Score and Plan: 4 or greater and Ondansetron, Dexamethasone, Midazolam, Scopolamine patch - Pre-op and Treatment may vary due to age or medical condition  Airway Management Planned: Oral ETT  Additional Equipment:   Intra-op Plan:   Post-operative Plan: Extubation in OR  Informed Consent: I have reviewed the patients History and Physical, chart, labs and discussed the procedure including the risks, benefits and alternatives for the proposed anesthesia with the patient or authorized representative who has indicated his/her understanding and acceptance.     Dental advisory given  Plan Discussed with: CRNA and Surgeon  Anesthesia Plan Comments:        Anesthesia Quick  Evaluation

## 2023-02-03 ENCOUNTER — Encounter (HOSPITAL_COMMUNITY): Payer: Self-pay | Admitting: Surgery

## 2023-02-03 DIAGNOSIS — E0520 Thyrotoxicosis with toxic multinodular goiter without thyrotoxic crisis or storm: Secondary | ICD-10-CM | POA: Diagnosis not present

## 2023-02-03 LAB — CALCIUM: Calcium: 8.3 mg/dL — ABNORMAL LOW (ref 8.9–10.3)

## 2023-02-03 MED ORDER — CALCIUM CARBONATE ANTACID 500 MG PO CHEW: 2.0000 | CHEWABLE_TABLET | Freq: Three times a day (TID) | ORAL | 1 refills | Status: AC

## 2023-02-03 MED ORDER — CALCIUM GLUCONATE-NACL 2-0.675 GM/100ML-% IV SOLN
2.0000 g | INTRAVENOUS | Status: AC
  Administered 2023-02-03: 2000 mg via INTRAVENOUS
  Filled 2023-02-03: qty 100

## 2023-02-03 MED ORDER — OXYCODONE HCL 5 MG PO TABS: 5.0000 mg | ORAL_TABLET | Freq: Four times a day (QID) | ORAL | 0 refills | Status: DC | PRN

## 2023-02-03 MED ORDER — LEVOTHYROXINE SODIUM 88 MCG PO TABS: 88.0000 ug | ORAL_TABLET | Freq: Every day | ORAL | 3 refills | Status: DC

## 2023-02-03 NOTE — Discharge Summary (Signed)
Physician Discharge Summary   Patient ID: Natalie Bond MRN: [id number] DOB/AGE: 02-28-1956 67 y.o.  Admit date: 02/02/2023  Discharge date: 02/03/2023  Discharge Diagnoses:  Principal Problem:   Goiter, toxic, multinodular Active Problems:   Toxic multinodular goiter   Discharged Condition: good  Hospital Course: Patient was admitted for observation following thyroid surgery.  Post op course was uncomplicated.  Pain was well controlled.  Tolerated diet.  Post op calcium level on morning following surgery was 8.3 mg/dl. Patient administered calcium gluconate 2 gm IVPB prior to discharge home.  Patient was prepared for discharge home on POD#1.  Consults: None  Treatments: surgery: total thyroidectomy  Discharge Exam: Blood pressure (!) 150/72, pulse 61, temperature 98 F (36.7 C), temperature source Oral, resp. rate 16, height '5\' 7"'$  (1.702 m), weight 81.6 kg, SpO2 97 %. HEENT - clear Neck - wound dry and intact; voice normal; mild STS; Dermabond in place  Disposition: Home  Discharge Instructions     Diet - low sodium heart healthy   Complete by: As directed    Increase activity slowly   Complete by: As directed    No dressing needed   Complete by: As directed       Allergies as of 02/03/2023       Reactions   Dilaudid [hydromorphone Hcl] Anaphylaxis   Dilaudid [hydromorphone Hcl] Anaphylaxis   [name]    Penicillins Anaphylaxis, Rash   Has patient had a PCN reaction causing immediate rash, facial/tongue/throat swelling, SOB or lightheadedness with hypotension: Yes Has patient had a PCN reaction causing severe rash involving mucus membranes or skin necrosis: No Has patient had a PCN reaction that required hospitalization Yes Has patient had a PCN reaction occurring within the last 10 years: No If all of the above answers are "NO", then may proceed with Cephalosporin use.   Atorvastatin    myalgias   Carvedilol    groggy   Gemfibrozil      fatigue   Lisinopril     cough   Metformin Hcl    stomach upset   Mobic [meloxicam]    Heartburn and elevated bp   Valsartan    Headache and caused kidney issues         Medication List     TAKE these medications    acetaminophen 650 MG CR tablet Commonly known as: TYLENOL Take 1,300 mg by mouth every 8 (eight) hours as needed for pain.   amLODipine 5 MG tablet Commonly known as: NORVASC Take 1 tablet (5 mg total) by mouth daily.   aspirin 81 MG tablet Take 81 mg by mouth daily.   butalbital-acetaminophen-caffeine 50-325-40 MG tablet Commonly known as: FIORICET Take 1 tablet by mouth every 12 (twelve) hours as needed for headache.   calcium carbonate 500 MG chewable tablet Commonly known as: Tums Chew 2 tablets (400 mg of elemental calcium total) by mouth 3 (three) times daily.   cloNIDine 0.1 MG tablet Commonly known as: CATAPRES Take 0.1 mg by mouth 2 (two) times daily as needed (If BP gets over 160).   cyclobenzaprine 10 MG tablet Commonly known as: FLEXERIL Take 10 mg by mouth 3 (three) times daily as needed for muscle spasms.   dicyclomine 20 MG tablet Commonly known as: BENTYL Take 20 mg by mouth 3 (three) times daily as needed for spasms.   gabapentin 300 MG capsule Commonly known as: NEURONTIN Take 300 mg by mouth at bedtime as needed (pain).  HYDROcodone-acetaminophen 5-325 MG tablet Commonly known as: Norco Take 1-2 tablets by mouth 2 (two) times daily as needed for moderate pain.   levothyroxine 88 MCG tablet Commonly known as: Synthroid Take 1 tablet (88 mcg total) by mouth daily before breakfast.   meclizine 25 MG tablet Commonly known as: ANTIVERT Take 25 mg by mouth 2 (two) times daily as needed for dizziness.   meloxicam 7.5 MG tablet Commonly known as: MOBIC Take 7.5 mg by mouth daily as needed for pain.   metoprolol succinate 100 MG 24 hr tablet Commonly known as: TOPROL-XL Take 100 mg by mouth in the morning and at bedtime.  Take with or immediately following a meal.   ondansetron 4 MG tablet Commonly known as: ZOFRAN Take 1-2 tablets (4-8 mg total) by mouth every 8 (eight) hours as needed for nausea or vomiting.   oxyCODONE 5 MG immediate release tablet Commonly known as: Oxy IR/ROXICODONE Take 1 tablet (5 mg total) by mouth every 6 (six) hours as needed for moderate pain.   pantoprazole 40 MG tablet Commonly known as: PROTONIX Take 40 mg by mouth 2 (two) times daily.   potassium chloride SA 20 MEQ tablet Commonly known as: KLOR-CON M Take 20 mEq by mouth daily.   Systane Complete 0.6 % Soln Generic drug: Propylene Glycol Place 1 drop into both eyes as needed (dry eyes).   topiramate 100 MG tablet Commonly known as: TOPAMAX Take 100 mg by mouth at bedtime.               Discharge Care Instructions  (From admission, onward)           Start     Ordered   02/03/23 0000  No dressing needed        02/03/23 1118            Follow-up Information     [name], MD. Schedule an appointment as soon as possible for a visit in 3 week(s).   Specialty: General Surgery Why: For wound re-check Contact information: [address] [id number] [phone]                 [name], [name] Office: [phone]   Signed: [name] 02/03/2023, 11:19 AM

## 2023-02-03 NOTE — Discharge Instructions (Signed)
[facility] - Dr. Deshunda Thackston  THYROID & PARATHYROID SURGERY:  POST-OP INSTRUCTIONS  Always review the instruction sheet provided by the hospital nurse at discharge.  A prescription for pain medication may be sent to your pharmacy at the time of discharge.  Take your pain medication as prescribed.  If narcotic pain medicine is not needed, then you may take acetaminophen (Tylenol) or ibuprofen (Advil) as needed for pain or soreness.  Take your normal home medications as prescribed unless otherwise directed.  If you need a refill on your pain medication, please contact the office during regular business hours.  Prescriptions will not be processed by the office after 5:00PM or on weekends.  Start with a light diet upon arrival home, such as soup and crackers or toast.  Be sure to drink plenty of fluids.  Resume your normal diet the day after surgery.  Most patients will experience some swelling and bruising on the chest and neck area.  Ice packs will help for the first 48 hours after arriving home.  Swelling and bruising will take several days to resolve.   It is common to experience some constipation after surgery.  Increasing fluid intake and taking a stool softener (Colace) will usually help to prevent this problem.  A mild laxative (Milk of Magnesia or Miralax) should be taken according to package directions if there has been no bowel movement after 48 hours.  Dermabond glue covers your incision. This seals the wound and you may shower at any time. The Dermabond will remain in place for about a week.  You may gradually remove the glue when it loosens around the edges.  If you need to loosen the Dermabond for removal, apply a layer of Vaseline to the wound for 15 minutes and then remove with a Kleenex. Your sutures are under the skin and will not show - they will dissolve on their own.  You may resume light daily activities beginning the day after discharge (such as self-care,  walking, climbing stairs), gradually increasing activities as tolerated. You may have sexual intercourse when it is comfortable. Refrain from any heavy lifting or straining until approved by your doctor. You may drive when you no longer are taking prescription pain medication, you can comfortably wear a seatbelt, and you can safely maneuver your car and apply the brakes.  You will see your doctor in the office for a follow-up appointment approximately three weeks after your surgery.  Make sure that you call for this appointment within a day or two after you arrive home to insure a convenient appointment time. Please have any requested laboratory tests performed a few days prior to your office visit so that the results will be available at your follow up appointment.  WHEN TO CALL THE [facility] OFFICE: -- Fever greater than 101.5 -- Inability to urinate -- Nausea and/or vomiting - persistent -- Extreme swelling or bruising -- Continued bleeding from incision -- Increased pain, redness, or drainage from the incision -- Difficulty swallowing or breathing -- Muscle cramping or spasms -- Numbness or tingling in hands or around lips  The clinic staff is available to answer your questions during regular business hours.  Please don't hesitate to call and ask to speak to one of the nurses if you have concerns.  [facility] OFFICE: [phone] (24 hours)  Please sign up for MyChart accounts. This will allow you to communicate directly with my nurse or myself without having to call the office. It will also allow you   to view your test results. You will need to enroll in MyChart for my office ([facility]) and for the hospital ([facility]).  Abner Ardis, MD [facility] A [facility] 

## 2023-02-03 NOTE — Progress Notes (Signed)
Reviewed written d/c instructions w pt and all questions answered. She verbalized understanding. D/C via w/c w all belongings in stable condition. 

## 2023-02-06 DIAGNOSIS — E890 Postprocedural hypothyroidism: Secondary | ICD-10-CM | POA: Diagnosis not present

## 2023-02-06 LAB — SURGICAL PATHOLOGY

## 2023-02-06 NOTE — Progress Notes (Signed)
Final pathology is benign.  [name], MD [facility] [place] Office: [phone]

## 2023-02-07 ENCOUNTER — Other Ambulatory Visit: Payer: Self-pay | Admitting: Surgery

## 2023-02-07 ENCOUNTER — Other Ambulatory Visit (HOSPITAL_COMMUNITY): Payer: Self-pay | Admitting: Surgery

## 2023-02-07 MED ORDER — CALCITRIOL 0.5 MCG PO CAPS: 0.5000 ug | ORAL_CAPSULE | Freq: Two times a day (BID) | ORAL | 0 refills | Status: AC

## 2023-02-09 DIAGNOSIS — E890 Postprocedural hypothyroidism: Secondary | ICD-10-CM | POA: Diagnosis not present

## 2023-02-20 DIAGNOSIS — E890 Postprocedural hypothyroidism: Secondary | ICD-10-CM | POA: Diagnosis not present

## 2023-04-04 DIAGNOSIS — E785 Hyperlipidemia, unspecified: Secondary | ICD-10-CM | POA: Diagnosis not present

## 2023-04-04 DIAGNOSIS — E0590 Thyrotoxicosis, unspecified without thyrotoxic crisis or storm: Secondary | ICD-10-CM | POA: Diagnosis not present

## 2023-04-04 DIAGNOSIS — K219 Gastro-esophageal reflux disease without esophagitis: Secondary | ICD-10-CM | POA: Diagnosis not present

## 2023-04-04 DIAGNOSIS — I10 Essential (primary) hypertension: Secondary | ICD-10-CM | POA: Diagnosis not present

## 2023-04-04 DIAGNOSIS — E1140 Type 2 diabetes mellitus with diabetic neuropathy, unspecified: Secondary | ICD-10-CM | POA: Diagnosis not present

## 2023-04-04 DIAGNOSIS — R7989 Other specified abnormal findings of blood chemistry: Secondary | ICD-10-CM | POA: Diagnosis not present

## 2023-04-11 DIAGNOSIS — E039 Hypothyroidism, unspecified: Secondary | ICD-10-CM | POA: Diagnosis not present

## 2023-04-11 DIAGNOSIS — M5442 Lumbago with sciatica, left side: Secondary | ICD-10-CM | POA: Diagnosis not present

## 2023-04-11 DIAGNOSIS — G4733 Obstructive sleep apnea (adult) (pediatric): Secondary | ICD-10-CM | POA: Diagnosis not present

## 2023-04-11 DIAGNOSIS — Z1331 Encounter for screening for depression: Secondary | ICD-10-CM | POA: Diagnosis not present

## 2023-04-11 DIAGNOSIS — I10 Essential (primary) hypertension: Secondary | ICD-10-CM | POA: Diagnosis not present

## 2023-04-11 DIAGNOSIS — E1140 Type 2 diabetes mellitus with diabetic neuropathy, unspecified: Secondary | ICD-10-CM | POA: Diagnosis not present

## 2023-04-11 DIAGNOSIS — Z1339 Encounter for screening examination for other mental health and behavioral disorders: Secondary | ICD-10-CM | POA: Diagnosis not present

## 2023-04-11 DIAGNOSIS — Z0000 Encounter for general adult medical examination without abnormal findings: Secondary | ICD-10-CM | POA: Diagnosis not present

## 2023-04-11 DIAGNOSIS — R82998 Other abnormal findings in urine: Secondary | ICD-10-CM | POA: Diagnosis not present

## 2023-04-11 DIAGNOSIS — E785 Hyperlipidemia, unspecified: Secondary | ICD-10-CM | POA: Diagnosis not present

## 2023-04-17 ENCOUNTER — Other Ambulatory Visit: Payer: Self-pay | Admitting: Internal Medicine

## 2023-04-17 DIAGNOSIS — Z0000 Encounter for general adult medical examination without abnormal findings: Secondary | ICD-10-CM

## 2023-04-27 ENCOUNTER — Telehealth: Payer: Self-pay | Admitting: Physical Medicine and Rehabilitation

## 2023-04-27 NOTE — Telephone Encounter (Signed)
Patient called needing to schedule an appointment with Dr. Alvester Morin for an Epidural injection in her back. The number to contact patient is [phone]

## 2023-04-28 NOTE — Telephone Encounter (Signed)
Spoke with patient and she is requesting another injection. She stated the last injection lasted about a month and she had at least 75-80% relief. No new falls, accidents or injuries. Patient wants to know if something can be called in for pain until injection. She states Dr. Eloise Harman cannot fill her medication anymore. Please advise.

## 2023-05-01 ENCOUNTER — Other Ambulatory Visit: Payer: Self-pay | Admitting: Physical Medicine and Rehabilitation

## 2023-05-01 DIAGNOSIS — M5416 Radiculopathy, lumbar region: Secondary | ICD-10-CM

## 2023-05-09 ENCOUNTER — Telehealth: Payer: Self-pay | Admitting: Physical Medicine and Rehabilitation

## 2023-05-09 NOTE — Telephone Encounter (Signed)
Spoke with patient and scheduled injection for 05/18/23. Patient aware driver needed

## 2023-05-09 NOTE — Telephone Encounter (Signed)
Patient called needing to schedule an appointment with Dr. Alvester Morin for her back. The number to contact patient is [phone]

## 2023-05-18 ENCOUNTER — Ambulatory Visit: Payer: PPO | Admitting: Physical Medicine and Rehabilitation

## 2023-05-18 ENCOUNTER — Other Ambulatory Visit: Payer: Self-pay

## 2023-05-18 VITALS — BP 189/95 | HR 59

## 2023-05-18 DIAGNOSIS — G894 Chronic pain syndrome: Secondary | ICD-10-CM | POA: Diagnosis not present

## 2023-05-18 DIAGNOSIS — L301 Dyshidrosis [pompholyx]: Secondary | ICD-10-CM | POA: Diagnosis not present

## 2023-05-18 DIAGNOSIS — J3489 Other specified disorders of nose and nasal sinuses: Secondary | ICD-10-CM | POA: Diagnosis not present

## 2023-05-18 DIAGNOSIS — M961 Postlaminectomy syndrome, not elsewhere classified: Secondary | ICD-10-CM

## 2023-05-18 DIAGNOSIS — M7918 Myalgia, other site: Secondary | ICD-10-CM

## 2023-05-18 DIAGNOSIS — M5416 Radiculopathy, lumbar region: Secondary | ICD-10-CM

## 2023-05-18 DIAGNOSIS — M5116 Intervertebral disc disorders with radiculopathy, lumbar region: Secondary | ICD-10-CM | POA: Diagnosis not present

## 2023-05-18 MED ORDER — METHYLPREDNISOLONE ACETATE 80 MG/ML IJ SUSP
80.0000 mg | Freq: Once | INTRAMUSCULAR | Status: AC
  Administered 2023-05-18: 80 mg

## 2023-05-18 NOTE — Patient Instructions (Signed)

## 2023-05-18 NOTE — Progress Notes (Signed)
Functional Pain Scale - descriptive words and definitions  Distressing (6)    Pain is present/unable to complete most ADLs limited by pain/sleep is difficult and active distraction is only marginal. Moderate range order  Average Pain 8   +Driver, -BT, -Dye Allergies.  Lower back pain on both sides

## 2023-05-26 NOTE — Procedures (Signed)
S1 Lumbosacral Transforaminal Epidural Steroid Injection - Sub-Pedicular Approach with Fluoroscopic Guidance   [name]      Date of Birth: [birth year] MRN: [id number] PCP: Garlan Fillers, MD      Visit Date: 05/18/2023   Universal Protocol:    Date/Time: 05/25/2409:29 AM  Consent Given By: the patient  Position:  PRONE  Additional Comments: Vital signs were monitored before and after the procedure. Patient was prepped and draped in the usual sterile fashion. The correct patient, procedure, and site was verified.   Injection Procedure Details:  Procedure Site One Meds Administered:  Meds ordered this encounter  Medications   methylPREDNISolone acetate (DEPO-MEDROL) injection 80 mg    Laterality: Bilateral  Location/Site:  S1 Foramen   Needle size: 22 ga.  Needle type: Spinal  Needle Placement: Transforaminal  Findings:   -Comments: Excellent flow of contrast along the nerve, nerve root and into the epidural space.  Epidurogram: Contrast epidurogram showed no nerve root cut off or restricted flow pattern.  Procedure Details: After squaring off the sacral end-plate to get a true AP view, the C-arm was positioned so that the best possible view of the S1 foramen was visualized. The soft tissues overlying this structure were infiltrated with 2-3 ml. of 1% Lidocaine without Epinephrine.    The spinal needle was inserted toward the target using a "trajectory" view along the fluoroscope beam.  Under AP and lateral visualization, the needle was advanced so it did not puncture dura. Biplanar projections were used to confirm position. Aspiration was confirmed to be negative for CSF and/or blood. A 1-2 ml. volume of Isovue-250 was injected and flow of contrast was noted at each level. Radiographs were obtained for documentation purposes.   After attaining the desired flow of contrast documented above, a 0.5 to 1.0 ml test dose of 0.25% Marcaine was injected  into each respective transforaminal space.  The patient was observed for 90 seconds post injection.  After no sensory deficits were reported, and normal lower extremity motor function was noted,   the above injectate was administered so that equal amounts of the injectate were placed at each foramen (level) into the transforaminal epidural space.   Additional Comments:  No complications occurred Dressing: Band-Aid with 2 x 2 sterile gauze    Post-procedure details: Patient was observed during the procedure. Post-procedure instructions were reviewed.  Patient left the clinic in stable condition.

## 2023-05-26 NOTE — Progress Notes (Signed)
Natalie Bond - 67 y.o. female MRN [id number]  Date of birth: 1956-08-11  Office Visit Note: Visit Date: 05/18/2023 PCP: Garlan Fillers, MD Referred by: Garlan Fillers, MD  Subjective: Chief Complaint  Patient presents with   Lower Back - Pain   HPI: Natalie Bond is a 67 y.o. female who comes in today for evaluation and management at the request of Dr. Dossie Arbour for chronic and worsening and severe mostly axial low back pain some referral into the buttocks.  Her history is quite well-known to me as she is seeing multiple physicians in our practice for multiple orthopedic complaints.  She has had left total hip replacement by Dr. Doneen Poisson.  She has had CMC joint reconstruction on both hands by Dr. Glee Arvin.  She has had prior carpal tunnel release.  She is also seeing Dr. Lajoyce Corners at least at some point when she was having a lot of swelling in her leg and ankle.  He was felt like at the time maybe she was having some complex regional pain syndrome.  We had seen her off-and-on for what was more of a persistent posterior leg pain that seem to get relief with epidural injection at the S1 foramen and S1 nerve root.  MRI findings never quite matched up with that.  The last time I saw her was in December and we did complete epidural injection with relief of some symptoms.  She is present today with her husband who provides some of the history.  Her report today is mostly low back pain 8 out of 10 pain with 6 out of 10 on the functional scale where she is unable to complete most of her ADLs and has difficulty sleeping etc.  Her biggest complaint today however is that Dr. Jarold Motto according to the patient has stopped giving her hydrocodone which she was receiving 90 months up until around January of this year.  She did have recent thyroidectomy and received oxycodone postsurgery.  That was in March.  She has also had a prescription for gabapentin in April.  She reports an interesting  comment that the settlement she had with injury that related to her lower back so that she would require pain medications.  She seems to relate this settlement almost as a contract that she would require someone to give her pain medications.  Had a long talk with her about the supplements typically are looked at as to what the patient may require and the severity of the problem and what may be required in the future but is not really a contract into what is provided medically.  I do think her symptoms are consistent with some lower spine arthritis.  She has not had facet joint block in a while but has had an ablation I think in the past.  We have been seeing her for so many years we have her on the old system.  She has not had any focal weakness or bowel bladder changes or red flag complaints.  I talked great length about pain medications with her and I think it is worth referring her to a pain clinic for the ability to monitor that if she needs it.  We are always happy to do short-term opioids if the situation arises with acute flareups.  Try to also explain to her medically she is fairly complicated with history of stroke and type 2 diabetes although hemoglobin A1c is in the prediabetic range and she is on no  medication.  She also has multiple drug intolerances.  I do think pain management clinic is the best approach to this.   I spent more than 30 minutes speaking face-to-face with the patient with 50% of the time in counseling and discussing coordination of care.       Review of Systems  Musculoskeletal:  Positive for back pain and joint pain.  Neurological:  Positive for tingling and weakness.  All other systems reviewed and are negative.  Otherwise per HPI.  Assessment & Plan: Visit Diagnoses:    ICD-10-CM   1. Lumbar radiculopathy  M54.16 XR C-ARM NO REPORT    Epidural Steroid injection    methylPREDNISolone acetate (DEPO-MEDROL) injection 80 mg    Ambulatory referral to Pain Clinic     2. Radiculopathy due to lumbar intervertebral disc disorder  M51.16 Ambulatory referral to Pain Clinic    3. Post laminectomy syndrome  M96.1 Ambulatory referral to Pain Clinic    4. Myofascial pain syndrome  M79.18 Ambulatory referral to Pain Clinic    5. Chronic pain syndrome  G89.4 Ambulatory referral to Pain Clinic       Plan: Findings:  Chronic worsening recalcitrant severe low back pain persistent for many years despite conservative and nonconservative care in terms of other joints.  No history of prior lumbar surgery.  Prior MRI from 2020 with mainly facet arthropathy.  Has done well with intermittent epidural injections at the S1 level.  Some degenerative changes at that level.  Nothing in the spine from an anatomical standpoint and pathological standpoint would require prolonged opioid treatment but that is something she is interested in.  I did make referral to outside pain clinic for at least medication evaluation.  Her primary care physician provided this for quite some time.  I do think epidural injection diagnostically is warranted today given the amount of pain she is having.  She does want to proceed with that.  Consider looking at diagnostic facet joint blocks versus updating MRI imaging at some point soon.  She will continue with home exercise plan and current medications.    Meds & Orders:  Meds ordered this encounter  Medications   methylPREDNISolone acetate (DEPO-MEDROL) injection 80 mg    Orders Placed This Encounter  Procedures   XR C-ARM NO REPORT   Ambulatory referral to Pain Clinic   Epidural Steroid injection    Follow-up: Return if symptoms worsen or fail to improve.   Procedures: No procedures performed  S1 Lumbosacral Transforaminal Epidural Steroid Injection - Sub-Pedicular Approach with Fluoroscopic Guidance   [name]      Date of Birth: [birth year] MRN: [id number] PCP: Garlan Fillers, MD      Visit Date: 05/18/2023   Universal  Protocol:    Date/Time: 05/25/2409:29 AM  Consent Given By: the patient  Position:  PRONE  Additional Comments: Vital signs were monitored before and after the procedure. Patient was prepped and draped in the usual sterile fashion. The correct patient, procedure, and site was verified.   Injection Procedure Details:  Procedure Site One Meds Administered:  Meds ordered this encounter  Medications   methylPREDNISolone acetate (DEPO-MEDROL) injection 80 mg    Laterality: Bilateral  Location/Site:  S1 Foramen   Needle size: 22 ga.  Needle type: Spinal  Needle Placement: Transforaminal  Findings:   -Comments: Excellent flow of contrast along the nerve, nerve root and into the epidural space.  Epidurogram: Contrast epidurogram showed no nerve root cut off or restricted flow  pattern.  Procedure Details: After squaring off the sacral end-plate to get a true AP view, the C-arm was positioned so that the best possible view of the S1 foramen was visualized. The soft tissues overlying this structure were infiltrated with 2-3 ml. of 1% Lidocaine without Epinephrine.    The spinal needle was inserted toward the target using a "trajectory" view along the fluoroscope beam.  Under AP and lateral visualization, the needle was advanced so it did not puncture dura. Biplanar projections were used to confirm position. Aspiration was confirmed to be negative for CSF and/or blood. A 1-2 ml. volume of Isovue-250 was injected and flow of contrast was noted at each level. Radiographs were obtained for documentation purposes.   After attaining the desired flow of contrast documented above, a 0.5 to 1.0 ml test dose of 0.25% Marcaine was injected into each respective transforaminal space.  The patient was observed for 90 seconds post injection.  After no sensory deficits were reported, and normal lower extremity motor function was noted,   the above injectate was administered so that equal amounts of  the injectate were placed at each foramen (level) into the transforaminal epidural space.   Additional Comments:  No complications occurred Dressing: Band-Aid with 2 x 2 sterile gauze    Post-procedure details: Patient was observed during the procedure. Post-procedure instructions were reviewed.  Patient left the clinic in stable condition.   Clinical History: MRI LUMBAR SPINE WITHOUT CONTRAST     TECHNIQUE:  Multiplanar, multisequence MR imaging of the lumbar spine was  performed. No intravenous contrast was administered.     COMPARISON:  12/18/2015 lumbar spine MRI.     FINDINGS:  Segmentation:  Standard.     Alignment: Mild lumbar levocurvature with apex at L3. Normal lumbar  lordosis without listhesis.     Vertebrae: No fracture, evidence of discitis, or bone lesion.  Right-sided L4-5 facet edema.     Conus medullaris and cauda equina: Conus extends to the L1-2 level.  Conus and cauda equina appear normal.     Paraspinal and other soft tissues: Chronic postsurgical changes  related to left L5-S1 hemilaminectomy. No fluid collection.     Disc levels:     T12-L1: No significant disc displacement, foraminal stenosis, or  canal stenosis.     L1-2: No significant disc displacement, foraminal stenosis, or canal  stenosis.     L2-3: Stable small left foraminal disc protrusion. No significant  foraminal or canal stenosis.     L3-4: Stable disc bulge and facet hypertrophy. Interval resorption  of right juxta-articular cyst. Mild bilateral recess stenosis. No  foraminal or canal stenosis.     L4-5: Stable disc bulge, facet hypertrophy, and ligamentum flavum  hypertrophy, greater on the left. Mild left lateral recess stenosis.  No foraminal or canal stenosis.     L5-S1: Left hemilaminectomy. Stable left central disc protrusion  with mild left lateral recess stenosis. No significant foraminal or  canal stenosis.     IMPRESSION:  1. No acute fracture.  Mild  levocurvature of the lumbar spine.  2. Right L4-5 facet edema, likely degenerative.  3. Stable lumbar spondylosis. No significant foraminal or canal  stenosis.        Electronically Signed    By: Mitzi Hansen M.D.    On: 12/01/2018 14:02   She reports that she has never smoked. She has never used smokeless tobacco.  Recent Labs    01/30/23 0830  HGBA1C 6.2*    Objective:  VS:  HT:    WT:   BMI:     BP:(!) 189/95  HR:(!) 59bpm  TEMP: ( )  RESP:  Physical Exam Vitals and nursing note reviewed.  Constitutional:      General: She is not in acute distress.    Appearance: Normal appearance. She is not ill-appearing.  HENT:     Head: Normocephalic and atraumatic.     Right Ear: External ear normal.     Left Ear: External ear normal.  Eyes:     Extraocular Movements: Extraocular movements intact.  Cardiovascular:     Rate and Rhythm: Normal rate.     Pulses: Normal pulses.  Pulmonary:     Effort: Pulmonary effort is normal. No respiratory distress.  Abdominal:     General: There is no distension.     Palpations: Abdomen is soft.  Musculoskeletal:        General: Tenderness present.     Cervical back: Neck supple.     Right lower leg: No edema.     Left lower leg: No edema.     Comments: Patient has good distal strength with no pain over the greater trochanters.  No clonus or focal weakness.  Pain with extension of the lumbar spine.  No focal trigger points noted.  No pain with hip rotation.  Good distal strength and no edema today.  Skin:    Findings: No erythema, lesion or rash.  Neurological:     General: No focal deficit present.     Mental Status: She is alert and oriented to person, place, and time.     Cranial Nerves: Cranial nerve deficit present.     Sensory: No sensory deficit.     Motor: No weakness or abnormal muscle tone.     Coordination: Coordination normal.     Gait: Gait abnormal.  Psychiatric:        Mood and Affect: Mood normal.         Behavior: Behavior normal.     Ortho Exam  Imaging: No results found.  Past Medical/Family/Surgical/Social History: Medications & Allergies reviewed per EMR, new medications updated. 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   Goiter, toxic, multinodular 01/28/2023   hyperthyroidism 03/17/2022   Cholangiectasis 03/17/2022   Esophageal dysphagia 03/17/2022   Fecal urgency 03/17/2022   Hyperglycemia 03/17/2022   Hypomagnesemia 03/17/2022   Neck pain 03/17/2022   Salivary calculus 03/17/2022   Spasm 03/17/2022   Hyperthyroidism 03/17/2022   Primary osteoarthritis of first carpometacarpal joint of left hand 12/24/2019   Vomiting 08/28/2019   Diabetic peripheral neuropathy associated with type 2 diabetes mellitus (HCC) 06/06/2019   Hemiplegia (HCC) 06/06/2019   TIA (transient ischemic attack) 05/29/2019   Anterolisthesis 05/29/2019   Thyroid nodule 05/29/2019   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   Right arm weakness 05/27/2019   Type 2 diabetes mellitus without complication (HCC) 05/27/2019   Arthritis of carpometacarpal (CMC) joint of right thumb 04/16/2019   Generalized pruritus 02/27/2019   Rash and other nonspecific skin eruption 02/27/2019   Dysuria 01/03/2019   Localized edema 10/22/2018   Shoulder joint pain 08/28/2018   Mixed conductive and sensorineural hearing loss of left ear with restricted hearing of right ear 04/26/2018   Vertigo 04/26/2018   Acute serous otitis media of left ear 04/26/2018   Referred otalgia of left ear 04/26/2018   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   Venous stasis dermatitis  of left lower extremity 04/09/2018   Achilles tendon contracture, left 04/09/2018   Pain in left ankle and joints of left foot 04/09/2018   Cough 04/09/2018   Otitis media 04/09/2018   Primary osteoarthritis of first carpometacarpal joint of right hand 10/09/2017    Bilateral hand pain 10/05/2017   Primary osteoarthritis of both first carpometacarpal joints 10/05/2017   Trochanteric bursitis, right hip 06/29/2017   Degenerative disc disease at L5-S1 level 05/04/2017   History of joint replacement 05/04/2017   Claw toe, acquired, right 04/27/2017   Achilles tendon contracture, right 04/27/2017   Pain in right foot 04/11/2017   Status post left hip replacement 10/21/2016   Chronic back pain 01/14/2016   Migraine 01/14/2016   Hematuria 03/27/2015   Shortness of breath 03/27/2015   Essential hypertension 01/22/2015   Obstructive sleep apnea (adult) (pediatric) 10/30/2014   Psychophysiologic insomnia 04/04/2014   Gastro-esophageal reflux disease without esophagitis 09/12/2013   Obesity 12/02/2011   Disequilibrium 11/10/2009   Enterocolitis due to Clostridium difficile 11/10/2009   Family history of diabetes mellitus 11/10/2009   Hyperlipidemia 11/10/2009   Meniere disease 11/10/2009   Headache 11/10/2009   Perforation of tympanic membrane 11/10/2009   Soft tissue lesion of shoulder region 11/10/2009   Past Medical History:  Diagnosis Date   Anxiety    Arthritis    Arthritis    back, wrists   Cancer (HCC)    skin cancer   Carpal tunnel syndrome    Complication of anesthesia    DDD (degenerative disc disease)    Diabetes mellitus without complication (HCC)    does not take meds   Dysrhythmia 1995   irreg hr   Full dentures    GERD (gastroesophageal reflux disease)    Headache    High cholesterol    History of bronchitis    History of kidney stones    2015   Hypertension    Hyperthyroidism    PONV (postoperative nausea and vomiting)    Sleep apnea    pt. does not use a CPAP or BiPAP   Stroke (HCC)    Pt. states possible mini stroke in 2014, no deficits   Family History  Problem Relation Age of Onset   Cancer Mother    Diabetes Mother    Heart disease Mother    Hyperlipidemia Mother    Hypertension Mother    Cancer Father     Diabetes Father    Heart disease Father    Hyperlipidemia Father    Hypertension Father    Varicose Veins Father    Hypertension Sister    Cancer Sister        colon   Heart disease Sister    Hyperlipidemia Sister    Hypertension Brother    Cancer Brother    Diabetes Brother    Hyperlipidemia Brother    Cancer Brother        pancreatic   Past Surgical History:  Procedure Laterality Date   ABDOMINAL HYSTERECTOMY     BACK SURGERY  11/28/2002   [id number]   BACK SURGERY     CARDIAC CATHETERIZATION     01/27/15 [facility]): EF 60%, normal coronaries.   CARPAL TUNNEL RELEASE  09/20/2012   Procedure: CARPAL TUNNEL RELEASE;  Surgeon: Wyn Forster., MD;  Location: [facility];  Service: Orthopedics;  Laterality: Left;   CARPOMETACARPEL SUSPENSION PLASTY Right 12/12/2018   Procedure: RIGHT THUMB CARPOMETACARPAL [facility]) ARTHROPLASTY;  Surgeon: Tarry Kos, MD;  Location: [facility];  Service: Orthopedics;  Laterality: Right;   CARPOMETACARPEL SUSPENSION PLASTY Left 12/30/2019   Procedure: LEFT THUMB CARPOMETACARPAL (CMC) ARTHROPLASTY;  Surgeon: Tarry Kos, MD;  Location: [facility];  Service: Orthopedics;  Laterality: Left;   CATARACT EXTRACTION, BILATERAL     CHOLECYSTECTOMY  07/2016   done [place]   DORSAL COMPARTMENT RELEASE  09/20/2012   Procedure: RELEASE DORSAL COMPARTMENT (DEQUERVAIN);  Surgeon: Wyn Forster., MD;  Location: [facility];  Service: Orthopedics;  Laterality: Left;   EYE SURGERY     bilat cataracts   EYE SURGERY Right    torn retina repaired   HERNIA REPAIR Left 1990   left groin    LOWER EXTREMITY ANGIOGRAM Left 06/08/2015   Procedure: Ascending Venogram  Iliac Vein ;  Surgeon: Sherren Kerns, MD;  Location: [facility];  Service: Vascular;  Laterality: Left;  ;  [facility] [employer] / [facility] HAVE BEEN NOTIFIED/ CONFIRMED   PARTIAL HYSTERECTOMY     SHOULDER SURGERY      THYROIDECTOMY N/A 02/02/2023   Procedure: TOTAL THYROIDECTOMY;  Surgeon: Darnell Level, MD;  Location: [facility];  Service: General;  Laterality: N/A;   TOTAL HIP ARTHROPLASTY Left 10/21/2016   Procedure: LEFT TOTAL HIP ARTHROPLASTY ANTERIOR APPROACH;  Surgeon: [name], MD;  Location: [facility];  Service: Orthopedics;  Laterality: Left;   WRIST SURGERY Bilateral    carpal tunnel   Social History   Occupational History   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

## 2023-06-13 DIAGNOSIS — E063 Autoimmune thyroiditis: Secondary | ICD-10-CM | POA: Diagnosis not present

## 2023-06-13 DIAGNOSIS — E041 Nontoxic single thyroid nodule: Secondary | ICD-10-CM | POA: Diagnosis not present

## 2023-06-13 DIAGNOSIS — E0590 Thyrotoxicosis, unspecified without thyrotoxic crisis or storm: Secondary | ICD-10-CM | POA: Diagnosis not present

## 2023-06-13 DIAGNOSIS — E039 Hypothyroidism, unspecified: Secondary | ICD-10-CM | POA: Diagnosis not present

## 2023-06-15 ENCOUNTER — Other Ambulatory Visit: Payer: Self-pay

## 2023-06-15 MED ORDER — PAROXETINE HCL 10 MG PO TABS: 10.0000 mg | ORAL_TABLET | Freq: Every day | ORAL | 6 refills | Status: DC

## 2023-06-15 NOTE — Progress Notes (Signed)
Patient called need refills on the paxil for her vasomotor symptoms. Patient states it is working very well and she is happy with the results. Armandina Stammer RN

## 2023-06-25 DIAGNOSIS — E039 Hypothyroidism, unspecified: Secondary | ICD-10-CM | POA: Diagnosis not present

## 2023-06-25 DIAGNOSIS — M5136 Other intervertebral disc degeneration, lumbar region: Secondary | ICD-10-CM | POA: Diagnosis not present

## 2023-06-25 DIAGNOSIS — G8929 Other chronic pain: Secondary | ICD-10-CM | POA: Diagnosis not present

## 2023-06-25 DIAGNOSIS — N959 Unspecified menopausal and perimenopausal disorder: Secondary | ICD-10-CM | POA: Diagnosis not present

## 2023-06-25 DIAGNOSIS — E663 Overweight: Secondary | ICD-10-CM | POA: Diagnosis not present

## 2023-06-25 DIAGNOSIS — I10 Essential (primary) hypertension: Secondary | ICD-10-CM | POA: Diagnosis not present

## 2023-06-25 DIAGNOSIS — E890 Postprocedural hypothyroidism: Secondary | ICD-10-CM | POA: Diagnosis not present

## 2023-06-25 DIAGNOSIS — G43909 Migraine, unspecified, not intractable, without status migrainosus: Secondary | ICD-10-CM | POA: Diagnosis not present

## 2023-06-25 DIAGNOSIS — M1990 Unspecified osteoarthritis, unspecified site: Secondary | ICD-10-CM | POA: Diagnosis not present

## 2023-06-25 DIAGNOSIS — K589 Irritable bowel syndrome without diarrhea: Secondary | ICD-10-CM | POA: Diagnosis not present

## 2023-06-25 DIAGNOSIS — K219 Gastro-esophageal reflux disease without esophagitis: Secondary | ICD-10-CM | POA: Diagnosis not present

## 2023-06-25 DIAGNOSIS — G629 Polyneuropathy, unspecified: Secondary | ICD-10-CM | POA: Diagnosis not present

## 2023-07-02 DIAGNOSIS — G43119 Migraine with aura, intractable, without status migrainosus: Secondary | ICD-10-CM | POA: Diagnosis not present

## 2023-07-02 DIAGNOSIS — R110 Nausea: Secondary | ICD-10-CM | POA: Diagnosis not present

## 2023-07-02 DIAGNOSIS — Z5321 Procedure and treatment not carried out due to patient leaving prior to being seen by health care provider: Secondary | ICD-10-CM | POA: Diagnosis not present

## 2023-07-02 DIAGNOSIS — G43909 Migraine, unspecified, not intractable, without status migrainosus: Secondary | ICD-10-CM | POA: Diagnosis not present

## 2023-07-02 DIAGNOSIS — R42 Dizziness and giddiness: Secondary | ICD-10-CM | POA: Diagnosis not present

## 2023-07-10 ENCOUNTER — Telehealth: Payer: Self-pay

## 2023-07-10 ENCOUNTER — Telehealth: Payer: Self-pay | Admitting: Physical Medicine and Rehabilitation

## 2023-07-10 NOTE — Telephone Encounter (Signed)
Per last procedure note, pain management was mentioned. Please advise

## 2023-07-10 NOTE — Telephone Encounter (Signed)
Patient called she needs an appt with pain management. The shot she had before didn't work>CB#[phone]

## 2023-07-10 NOTE — Telephone Encounter (Signed)
Transition Care Management Follow-up Telephone Call Date of discharge and from where: 07/02/2023 [facility] How have you been since you were released from the hospital? Patient LWBS.   [facility]  [facility] Population Health Community Resource Care Guide   [email or web address]  ?? [phone]   Website: [email or web address]

## 2023-07-14 DIAGNOSIS — K219 Gastro-esophageal reflux disease without esophagitis: Secondary | ICD-10-CM | POA: Diagnosis not present

## 2023-07-20 ENCOUNTER — Telehealth: Payer: Self-pay

## 2023-07-20 NOTE — Telephone Encounter (Signed)
Spoke with patient. Patient is wanting to make an appointment to be seen for GERD. Patient has hx with Dr. Rayfield Citizen in [place]. Patient is wanting to transfer, does not have a specific doctor she wants to see. Patient has been advised to get records sent for review.

## 2023-08-02 ENCOUNTER — Telehealth: Payer: Self-pay | Admitting: Gastroenterology

## 2023-08-02 NOTE — Telephone Encounter (Signed)
Hi Dr. Meridee Score,  Supervising Provider: 08/02/23-AM  Patient called requesting a transfer of care from Dr. Silvana Newness office who is now retired. She stated having upper GI issues with Genella Rife, previous had a EGD in 2023. Records were obtained for you to review and advise on scheduling.   Thanks

## 2023-08-03 NOTE — Telephone Encounter (Signed)
Patient will f/u with previous gi to obtain reports.

## 2023-08-03 NOTE — Telephone Encounter (Signed)
Called [facility] to request a colonoscopy report no answer from the office. Called patient as well to advise her of the report needed for further review. Left voicemail.

## 2023-08-03 NOTE — Telephone Encounter (Signed)
Due to the patient's prior GI provider retiring, I am okay for this patient to be accepted to [facility]. The review of her records only shows an EGD report and there is a reported history/family history of colonoscopy and colon cancer. Please obtain the colonoscopy results so those can be into the system. Patient may be offered clinic visit with me or with APP new patient. In the interim patient will need to follow with her PCP until the visit. Thanks. GM

## 2023-08-03 NOTE — Telephone Encounter (Signed)
Patient scheduled for 12/3 with provider.

## 2023-08-10 ENCOUNTER — Other Ambulatory Visit (HOSPITAL_COMMUNITY)
Admission: RE | Admit: 2023-08-10 | Discharge: 2023-08-10 | Disposition: A | Discharge status: Home / Self Care | Payer: PPO | Source: Ambulatory Visit | Attending: Obstetrics and Gynecology | Admitting: Obstetrics and Gynecology

## 2023-08-10 ENCOUNTER — Ambulatory Visit: Payer: PPO | Admitting: Obstetrics and Gynecology

## 2023-08-10 ENCOUNTER — Encounter: Payer: Self-pay | Admitting: Obstetrics and Gynecology

## 2023-08-10 ENCOUNTER — Emergency Department (HOSPITAL_BASED_OUTPATIENT_CLINIC_OR_DEPARTMENT_OTHER)
Admission: EM | Admit: 2023-08-10 | Discharge: 2023-08-10 | Discharge status: Against Medical Advice | Payer: PPO | Source: Home / Self Care

## 2023-08-10 ENCOUNTER — Encounter: Payer: Self-pay | Admitting: Gastroenterology

## 2023-08-10 VITALS — BP 223/97 | HR 63 | Wt 194.0 lb

## 2023-08-10 DIAGNOSIS — I10 Essential (primary) hypertension: Secondary | ICD-10-CM | POA: Diagnosis not present

## 2023-08-10 DIAGNOSIS — L292 Pruritus vulvae: Secondary | ICD-10-CM | POA: Insufficient documentation

## 2023-08-10 DIAGNOSIS — N951 Menopausal and female climacteric states: Secondary | ICD-10-CM | POA: Diagnosis not present

## 2023-08-10 MED ORDER — CLOTRIMAZOLE-BETAMETHASONE 1-0.05 % EX CREA: 1.0000 | TOPICAL_CREAM | Freq: Two times a day (BID) | CUTANEOUS | 0 refills | Status: AC

## 2023-08-10 MED ORDER — PAROXETINE HCL 20 MG PO TABS: 20.0000 mg | ORAL_TABLET | Freq: Every day | ORAL | 6 refills | Status: DC

## 2023-08-10 NOTE — Progress Notes (Signed)
GYNECOLOGY VISIT  Patient name: Natalie Bond MRN [id number]  Date of birth: 10/23/1956 Chief Complaint:   Vaginitis and vulvar irritation  History:  Natalie Bond is a 67 y.o. No obstetric history on file. being seen today for vulvar irritation.  Feels very itrritated around the "top" and when Natalie Bond urinates thr eis burning where the urine comes out. Burns after taking a bath and using soap, it wil burn.  Will feel burning on the outside "at the top" when shewipes. Natalie Bond has been using went to a dermatitis will get blisters on hand and then will get it on her hand, touched her fasce after it bursted and got on her face then touched her vulva and thinks that is what is on her vulva  Applied betamethasone that was prescribed for hands on skin and it burned a little but overall helped No other changes in medication, soaps, underwear, etc No new soaps used Has been usign dial (long time use) and q2 day betadine rinsess wthout improvement No prior hx of vaginal infections  Husband takes med for BP and turning 71 and rarely sexually active (maybe once a year) - both monogamous. Does not engage in sexual touch.   Also had initial improvement in hot flashes with paxil but returned after thyroid removal.    Past Medical History:  Diagnosis Date   Anxiety    Arthritis    Arthritis    back, wrists   Cancer (HCC)    skin cancer   Carpal tunnel syndrome    Complication of anesthesia    DDD (degenerative disc disease)    Diabetes mellitus without complication (HCC)    does not take meds   Dysrhythmia 1995   irreg hr   Full dentures    GERD (gastroesophageal reflux disease)    Headache    High cholesterol    History of bronchitis    History of kidney stones    2015   Hypertension    Hyperthyroidism    PONV (postoperative nausea and vomiting)    Sleep apnea    pt. does not use a CPAP or BiPAP   Stroke (HCC)    Pt. states possible mini stroke in 2014, no deficits    Past Surgical  History:  Procedure Laterality Date   ABDOMINAL HYSTERECTOMY     BACK SURGERY  11/28/2002   [id number]   BACK SURGERY     CARDIAC CATHETERIZATION     01/27/15 [facility]): EF 60%, normal coronaries.   CARPAL TUNNEL RELEASE  09/20/2012   Procedure: CARPAL TUNNEL RELEASE;  Surgeon: Wyn Forster., MD;  Location: [facility];  Service: Orthopedics;  Laterality: Left;   CARPOMETACARPEL SUSPENSION PLASTY Right 12/12/2018   Procedure: RIGHT THUMB CARPOMETACARPAL (CMC) ARTHROPLASTY;  Surgeon: Tarry Kos, MD;  Location: [facility];  Service: Orthopedics;  Laterality: Right;   CARPOMETACARPEL SUSPENSION PLASTY Left 12/30/2019   Procedure: LEFT THUMB CARPOMETACARPAL (CMC) ARTHROPLASTY;  Surgeon: Tarry Kos, MD;  Location: [facility];  Service: Orthopedics;  Laterality: Left;   CATARACT EXTRACTION, BILATERAL     CHOLECYSTECTOMY  07/2016   done [place]   DORSAL COMPARTMENT RELEASE  09/20/2012   Procedure: RELEASE DORSAL COMPARTMENT (DEQUERVAIN);  Surgeon: Wyn Forster., MD;  Location: [facility];  Service: Orthopedics;  Laterality: Left;   EYE SURGERY     bilat cataracts   EYE SURGERY Right    torn retina repaired  HERNIA REPAIR Left 1990   left groin    LOWER EXTREMITY ANGIOGRAM Left 06/08/2015   Procedure: Ascending Venogram  Iliac Vein ;  Surgeon: Sherren Kerns, MD;  Location: [facility];  Service: Vascular;  Laterality: Left;  ;  TARA- [facility] [employer] / ZACH-[facility] HAVE BEEN NOTIFIED/ CONFIRMED   PARTIAL HYSTERECTOMY     SHOULDER SURGERY     THYROIDECTOMY N/A 02/02/2023   Procedure: TOTAL THYROIDECTOMY;  Surgeon: Darnell Level, MD;  Location: [facility];  Service: General;  Laterality: N/A;   TOTAL HIP ARTHROPLASTY Left 10/21/2016   Procedure: LEFT TOTAL HIP ARTHROPLASTY ANTERIOR APPROACH;  Surgeon: [name], MD;  Location: [facility];  Service: Orthopedics;  Laterality: Left;   WRIST SURGERY Bilateral    carpal  tunnel    The following portions of the patient's history were reviewed and updated as appropriate: allergies, current medications, past family history, past medical history, past social history, past surgical history and problem list.   Health Maintenance:   Last pap s/p hysterectomy Last mammogram: none on file   Review of Systems:  Pertinent items are noted in HPI. Comprehensive review of systems was otherwise negative.   Objective:  Physical Exam BP (!) 168/90   Pulse 62   Wt 194 lb (88 kg)   BMI 30.38 kg/m    Physical Exam Vitals and nursing note reviewed. Exam conducted with a chaperone present.  Constitutional:      Appearance: Normal appearance.  HENT:     Head: Normocephalic and atraumatic.  Pulmonary:     Effort: Pulmonary effort is normal.  Genitourinary:    Comments: Erythema along medial labia majora, labia minora and introitus Atrophic urethral meatus No papules, macules, or changes in skin color No perianal involvement Skin:    General: Skin is warm and dry.  Neurological:     General: No focal deficit present.     Mental Status: Natalie Bond is alert.  Psychiatric:        Mood and Affect: Mood normal.        Behavior: Behavior normal.        Thought Content: Thought content normal.        Judgment: Judgment normal.      Media Information      Assessment & Plan:   1. Vulvar pruritus Trial of topical antifungal/corticosteroid of erythematous region. Stop use of dial and use of only dove if desiring to use soap. Cotton underwear or no underwear if not needed. Vaginitis swab collected, will follow up results.  - clotrimazole-betamethasone (LOTRISONE) cream; Apply 1 Application topically 2 (two) times daily.  Dispense: 30 g; Refill: 0 - Cervicovaginal ancillary only( [facility])  2. Vasomotor symptoms due to menopause Increase dose of paxil for VSM control.  - PARoxetine (PAXIL) 20 MG tablet; Take 1 tablet (20 mg total) by mouth daily.  Dispense: 30  tablet; Refill: 6  3. Essential hypertension Noted to have elevated BP at start of visit, on repeat after visit, noted to be severely elevated. Disclosed to CMA having slight lightheadedness and dizziness as well. Noted to have hx of stroke, sent to ED for evaluation.   Routine preventative health maintenance measures emphasized.  Lorriane Shire, MD [facility] for [facility], [facility]

## 2023-08-10 NOTE — Progress Notes (Signed)
Patient had 2 elevated BP readings in office today and is symptomatic. States she is having blurry vision and dizziness. Advised patient to go to the ED at 1615. Patient voiced understanding. Quanna Wittke l Wing Schoch, CMA

## 2023-08-14 LAB — CERVICOVAGINAL ANCILLARY ONLY
Bacterial Vaginitis (gardnerella): NEGATIVE
Candida Glabrata: NEGATIVE
Candida Vaginitis: POSITIVE — AB
Comment: NEGATIVE
Comment: NEGATIVE
Comment: NEGATIVE

## 2023-08-15 ENCOUNTER — Other Ambulatory Visit: Payer: Self-pay | Admitting: Obstetrics and Gynecology

## 2023-08-15 DIAGNOSIS — L292 Pruritus vulvae: Secondary | ICD-10-CM

## 2023-08-15 DIAGNOSIS — B3731 Acute candidiasis of vulva and vagina: Secondary | ICD-10-CM

## 2023-08-15 MED ORDER — FLUCONAZOLE 150 MG PO TABS: 150.0000 mg | ORAL_TABLET | ORAL | 0 refills | Status: DC

## 2023-08-25 ENCOUNTER — Telehealth: Payer: Self-pay | Admitting: Physical Medicine and Rehabilitation

## 2023-08-25 NOTE — Telephone Encounter (Signed)
Pt is requesting to be referred to a pain management doctor. Best call back #[phone]

## 2023-08-29 NOTE — Telephone Encounter (Signed)
Left detailed voicemail explaining Dr. Eloise Harman would have to put a referral in for pain management

## 2023-09-08 ENCOUNTER — Ambulatory Visit: Payer: PPO | Admitting: Obstetrics and Gynecology

## 2023-09-19 DIAGNOSIS — E1151 Type 2 diabetes mellitus with diabetic peripheral angiopathy without gangrene: Secondary | ICD-10-CM | POA: Diagnosis not present

## 2023-09-19 DIAGNOSIS — E785 Hyperlipidemia, unspecified: Secondary | ICD-10-CM | POA: Diagnosis not present

## 2023-09-19 DIAGNOSIS — K219 Gastro-esophageal reflux disease without esophagitis: Secondary | ICD-10-CM | POA: Diagnosis not present

## 2023-09-19 DIAGNOSIS — I739 Peripheral vascular disease, unspecified: Secondary | ICD-10-CM | POA: Diagnosis not present

## 2023-09-19 DIAGNOSIS — E039 Hypothyroidism, unspecified: Secondary | ICD-10-CM | POA: Diagnosis not present

## 2023-09-19 DIAGNOSIS — E1140 Type 2 diabetes mellitus with diabetic neuropathy, unspecified: Secondary | ICD-10-CM | POA: Diagnosis not present

## 2023-09-19 DIAGNOSIS — G4733 Obstructive sleep apnea (adult) (pediatric): Secondary | ICD-10-CM | POA: Diagnosis not present

## 2023-09-19 DIAGNOSIS — G43909 Migraine, unspecified, not intractable, without status migrainosus: Secondary | ICD-10-CM | POA: Diagnosis not present

## 2023-09-19 DIAGNOSIS — M1611 Unilateral primary osteoarthritis, right hip: Secondary | ICD-10-CM | POA: Diagnosis not present

## 2023-09-19 DIAGNOSIS — M5442 Lumbago with sciatica, left side: Secondary | ICD-10-CM | POA: Diagnosis not present

## 2023-09-19 DIAGNOSIS — I10 Essential (primary) hypertension: Secondary | ICD-10-CM | POA: Diagnosis not present

## 2023-09-29 ENCOUNTER — Other Ambulatory Visit (HOSPITAL_BASED_OUTPATIENT_CLINIC_OR_DEPARTMENT_OTHER): Payer: Self-pay | Admitting: Cardiovascular Disease

## 2023-10-02 DIAGNOSIS — Z79899 Other long term (current) drug therapy: Secondary | ICD-10-CM | POA: Diagnosis not present

## 2023-10-02 DIAGNOSIS — G8929 Other chronic pain: Secondary | ICD-10-CM | POA: Diagnosis not present

## 2023-10-02 DIAGNOSIS — M5416 Radiculopathy, lumbar region: Secondary | ICD-10-CM | POA: Diagnosis not present

## 2023-10-16 ENCOUNTER — Other Ambulatory Visit (INDEPENDENT_AMBULATORY_CARE_PROVIDER_SITE_OTHER): Payer: Self-pay

## 2023-10-16 ENCOUNTER — Encounter: Payer: Self-pay | Admitting: Orthopaedic Surgery

## 2023-10-16 ENCOUNTER — Ambulatory Visit (INDEPENDENT_AMBULATORY_CARE_PROVIDER_SITE_OTHER): Payer: PPO | Admitting: Orthopaedic Surgery

## 2023-10-16 DIAGNOSIS — M25551 Pain in right hip: Secondary | ICD-10-CM | POA: Diagnosis not present

## 2023-10-16 DIAGNOSIS — M1611 Unilateral primary osteoarthritis, right hip: Secondary | ICD-10-CM | POA: Diagnosis not present

## 2023-10-16 NOTE — Progress Notes (Signed)
The patient is well-known to Natalie Bond.  We successfully replaced her left hip back in 2017 secondary to osteoarthritis.  She is 67 years old and very active.  She has developed over time significant right hip and groin pain.  At this point it is detrimentally affecting her mobility, her quality of life and her actives daily living.  She said the groin pain is significant and the pain is worse at night.  There is been going on for over a year now.  It is definitely causing her to decrease some of her activities as a result of her right hip arthritis.  She says the left hip is done very well.  This is now affecting her posture and her back as well.  She does have chronic spine issues already.  I was able to see the CT scan last year that was ordered to look at her kidneys and pelvis.  I was able to pull up the CT scan in terms of assessing bone windows.  There is significant arthritic changes in her right hip especially at the weightbearing surface and cystic changes at the posterior femoral head and acetabulum.  X-rays in the office today also confirm osteoarthritis of the right hip that is slowly worsening.  The left total hip arthroplasty appears well-seated.   On exam her left operative hip moves smoothly and fluidly with no difficulty at all.  Examination of her right hip shows significant stiffness with internal and external rotation and severe pain in the groin with rotation.  At this point she is interested in hip replacement surgery and I agree with this as well based on her clinical exam findings and x-ray findings.  I was able to review within epic her past medical history and medications.  She says her health is very stable right now and there is really no significant issues that we can see looking at her medical problem list combined with her medications and previous labs.  She understands we will work on getting her scheduled for a right total hip arthroplasty in the near future.  All question concerns  were answered and addressed.  Having had this before she is fully aware of the risks and benefits of surgery and what to expect from an intraoperative and postoperative standpoint.

## 2023-10-22 ENCOUNTER — Emergency Department (HOSPITAL_COMMUNITY): Payer: PPO

## 2023-10-22 ENCOUNTER — Emergency Department (HOSPITAL_COMMUNITY)
Admission: EM | Admit: 2023-10-22 | Discharge: 2023-10-23 | Disposition: A | Discharge status: Home / Self Care | Payer: PPO | Attending: Emergency Medicine | Admitting: Emergency Medicine

## 2023-10-22 DIAGNOSIS — R0789 Other chest pain: Secondary | ICD-10-CM | POA: Diagnosis not present

## 2023-10-22 DIAGNOSIS — R110 Nausea: Secondary | ICD-10-CM | POA: Diagnosis not present

## 2023-10-22 DIAGNOSIS — J9811 Atelectasis: Secondary | ICD-10-CM | POA: Diagnosis not present

## 2023-10-22 DIAGNOSIS — R519 Headache, unspecified: Secondary | ICD-10-CM | POA: Diagnosis present

## 2023-10-22 DIAGNOSIS — R739 Hyperglycemia, unspecified: Secondary | ICD-10-CM | POA: Diagnosis not present

## 2023-10-22 DIAGNOSIS — G4489 Other headache syndrome: Secondary | ICD-10-CM | POA: Diagnosis not present

## 2023-10-22 DIAGNOSIS — Z8673 Personal history of transient ischemic attack (TIA), and cerebral infarction without residual deficits: Secondary | ICD-10-CM | POA: Diagnosis not present

## 2023-10-22 DIAGNOSIS — R42 Dizziness and giddiness: Secondary | ICD-10-CM | POA: Diagnosis not present

## 2023-10-22 DIAGNOSIS — Z79899 Other long term (current) drug therapy: Secondary | ICD-10-CM | POA: Diagnosis not present

## 2023-10-22 DIAGNOSIS — R55 Syncope and collapse: Secondary | ICD-10-CM | POA: Diagnosis not present

## 2023-10-22 DIAGNOSIS — Z7982 Long term (current) use of aspirin: Secondary | ICD-10-CM | POA: Diagnosis not present

## 2023-10-22 DIAGNOSIS — R0602 Shortness of breath: Secondary | ICD-10-CM | POA: Insufficient documentation

## 2023-10-22 DIAGNOSIS — I10 Essential (primary) hypertension: Secondary | ICD-10-CM | POA: Insufficient documentation

## 2023-10-22 LAB — URINALYSIS, ROUTINE W REFLEX MICROSCOPIC
Bilirubin Urine: NEGATIVE
Glucose, UA: NEGATIVE mg/dL
Hgb urine dipstick: NEGATIVE
Ketones, ur: NEGATIVE mg/dL
Leukocytes,Ua: NEGATIVE
Nitrite: NEGATIVE
Protein, ur: NEGATIVE mg/dL
Specific Gravity, Urine: 1.01 (ref 1.005–1.030)
pH: 6 (ref 5.0–8.0)

## 2023-10-22 LAB — ETHANOL: Alcohol, Ethyl (B): <10 mg/dL (ref <10)

## 2023-10-22 LAB — DIFFERENTIAL
Abs Immature Granulocytes: 0.04 10*3/uL (ref 0.00–0.07)
Basophils Absolute: 0.1 10*3/uL (ref 0.0–0.1)
Basophils Relative: 1 %
Eosinophils Absolute: 0.2 10*3/uL (ref 0.0–0.5)
Eosinophils Relative: 2 %
Immature Granulocytes: 0 %
Lymphocytes Relative: 32 %
Lymphs Abs: 3.1 10*3/uL (ref 0.7–4.0)
Monocytes Absolute: 0.7 10*3/uL (ref 0.1–1.0)
Monocytes Relative: 7 %
Neutro Abs: 5.4 10*3/uL (ref 1.7–7.7)
Neutrophils Relative %: 58 %

## 2023-10-22 LAB — RAPID URINE DRUG SCREEN, HOSP PERFORMED
Amphetamines: NOT DETECTED
Barbiturates: POSITIVE — AB
Benzodiazepines: NOT DETECTED
Cocaine: NOT DETECTED
Opiates: NOT DETECTED
Tetrahydrocannabinol: NOT DETECTED

## 2023-10-22 LAB — CBC
HCT: 40.5 % (ref 36.0–46.0)
Hemoglobin: 13.1 g/dL (ref 12.0–15.0)
MCH: 30 pg (ref 26.0–34.0)
MCHC: 32.3 g/dL (ref 30.0–36.0)
MCV: 92.9 fL (ref 80.0–100.0)
Platelets: 268 10*3/uL (ref 150–400)
RBC: 4.36 MIL/uL (ref 3.87–5.11)
RDW: 12.8 % (ref 11.5–15.5)
WBC: 9.6 10*3/uL (ref 4.0–10.5)
nRBC: 0 % (ref 0.0–0.2)

## 2023-10-22 LAB — PROTIME-INR
INR: 1 (ref 0.8–1.2)
Prothrombin Time: 13.7 s (ref 11.4–15.2)

## 2023-10-22 LAB — APTT: aPTT: 28 s (ref 24–36)

## 2023-10-22 MED ORDER — MECLIZINE HCL 25 MG PO TABS
25.0000 mg | ORAL_TABLET | Freq: Once | ORAL | Status: AC
  Administered 2023-10-22: 25 mg via ORAL
  Filled 2023-10-22: qty 1

## 2023-10-22 NOTE — ED Provider Notes (Signed)
[place] EMERGENCY DEPARTMENT AT [facility] Provider Note   CSN: [id number] Arrival date & time: 10/22/23  2251     History {Add pertinent medical, surgical, social history, OB history to HPI:1} Chief Complaint  Patient presents with   Hypertension    Natalie Bond is a 67 y.o. female.  The history is provided by the patient.  Patient presents for multiple complaints.  Patient reports she was in her usual health around dinnertime.  Soon after she had a difficult conversation with her teenage granddaughter and soon after she began feeling anxious.  She reports feeling dizzy and had difficulty walking.  She had mild headache. No visual changes, no vomiting.  She reports both of her legs feel weak.  No arm weakness or numbness She also reports mild chest tightness and shortness of breath.  She has been compliant with her antihypertensives including her evening clonidine.  No other new medications Patient reports this feels like when she had her "systemic stroke" No slurred speech or confusion is reported     Home Medications Prior to Admission medications   Medication Sig Start Date End Date Taking? Authorizing Provider  acetaminophen (TYLENOL) 650 MG CR tablet Take 1,300 mg by mouth every 8 (eight) hours as needed for pain.    [provider]  amLODipine (NORVASC) 5 MG tablet Take 1 tablet by mouth once daily 09/29/23   Chilton Si, MD  aspirin 81 MG tablet Take 81 mg by mouth daily.    [provider]  butalbital-acetaminophen-caffeine (FIORICET) 50-325-40 MG tablet Take 1 tablet by mouth every 12 (twelve) hours as needed for headache. 05/29/19   Black, Lesle Chris, NP  calcitRIOL (ROCALTROL) 0.5 MCG capsule Take 1 capsule (0.5 mcg total) by mouth 2 (two) times daily. 02/07/23   Darnell Level, MD  calcium carbonate (TUMS) 500 MG chewable tablet Chew 2 tablets (400 mg of elemental calcium total) by mouth 3 (three) times daily. 02/03/23   Darnell Level, MD   cloNIDine (CATAPRES) 0.1 MG tablet Take 0.1 mg by mouth 2 (two) times daily as needed (If BP gets over 160).    [provider]  clotrimazole-betamethasone (LOTRISONE) cream Apply 1 Application topically 2 (two) times daily. 08/10/23   Lorriane Shire, MD  cyclobenzaprine (FLEXERIL) 10 MG tablet Take 10 mg by mouth 3 (three) times daily as needed for muscle spasms.    [provider]  dicyclomine (BENTYL) 20 MG tablet Take 20 mg by mouth 3 (three) times daily as needed for spasms.    [provider]  famotidine (PEPCID) 20 MG tablet Take 20 mg by mouth 2 (two) times daily.    [provider]  fluconazole (DIFLUCAN) 150 MG tablet Take 1 tablet (150 mg total) by mouth every 3 (three) days. Can take additional dose three days later if symptoms persist 08/15/23   Lorriane Shire, MD  gabapentin (NEURONTIN) 300 MG capsule Take 300 mg by mouth at bedtime as needed (pain).    [provider]  levothyroxine (SYNTHROID) 88 MCG tablet Take 1 tablet (88 mcg total) by mouth daily before breakfast. 02/03/23   Darnell Level, MD  meclizine (ANTIVERT) 25 MG tablet Take 25 mg by mouth 2 (two) times daily as needed for dizziness. 01/13/22   [provider]  meloxicam (MOBIC) 7.5 MG tablet Take 7.5 mg by mouth daily as needed for pain. 07/25/19   [provider]  metoprolol succinate (TOPROL-XL) 100 MG 24 hr tablet Take 100 mg by mouth  in the morning and at bedtime. Take with or immediately following a meal.    [provider]  ondansetron (ZOFRAN) 4 MG tablet Take 1-2 tablets (4-8 mg total) by mouth every 8 (eight) hours as needed for nausea or vomiting. 12/29/19   Tarry Kos, MD  pantoprazole (PROTONIX) 40 MG tablet Take 40 mg by mouth 2 (two) times daily. 12/20/21   [provider]  PARoxetine (PAXIL) 20 MG tablet Take 1 tablet (20 mg total) by mouth daily. 08/10/23   Lorriane Shire, MD  potassium chloride SA (KLOR-CON M) 20 MEQ  tablet Take 20 mEq by mouth daily. 03/03/11   [provider]  Propylene Glycol (SYSTANE COMPLETE) 0.6 % SOLN Place 1 drop into both eyes as needed (dry eyes).    [provider]  topiramate (TOPAMAX) 100 MG tablet Take 100 mg by mouth at bedtime. 12/20/21   [provider]      Allergies    Dilaudid [hydromorphone hcl], Dilaudid [hydromorphone hcl], Penicillins, Atorvastatin, Carvedilol, Gemfibrozil, Lisinopril, Metformin hcl, Mobic [meloxicam], and Valsartan    Review of Systems   Review of Systems  Constitutional:  Negative for fever.  Eyes:  Negative for visual disturbance.  Respiratory:  Positive for shortness of breath.   Cardiovascular:  Positive for chest pain.  Gastrointestinal:  Negative for abdominal pain.  Musculoskeletal:        Chronic neck and back pain unchanged  Neurological:  Positive for dizziness and headaches. Negative for facial asymmetry, speech difficulty and numbness.    Physical Exam Updated Vital Signs BP (!) 205/105   Pulse (!) 55   Temp 98 F (36.7 C)   Resp 18   SpO2 96%  Physical Exam CONSTITUTIONAL: Well developed/well nourished, anxious HEAD: Normocephalic/atraumatic EYES: EOMI/PERRL, no nystagmus, no visual field deficit  no ptosis ENMT: Mucous membranes moist NECK: supple no meningeal signs, no bruits CV: S1/S2 noted, no murmurs/rubs/gallops noted LUNGS: Lungs are clear to auscultation bilaterally, no apparent distress ABDOMEN: soft, nontender NEURO:Awake/alert, face symmetric, no arm or leg drift is noted Equal 5/5 strength with shoulder abduction, elbow flex/extension, wrist flex/extension in upper extremities and equal hand grips bilaterally Equal 5/5 strength with hip flexion,knee flex/extension, foot dorsi/plantar flexion Cranial nerves 3/4/5/6/06/05/09/11/12 tested and intact Normal heel-to-shin bilaterally No past pointing Sensation to light touch intact in all extremities NIHSS=0 Patient is able to stand  but difficulty walking due to dizziness EXTREMITIES: pulses normal, full ROM, chronic edema of the lower extremities SKIN: warm, color normal PSYCH: Anxious  ED Results / Procedures / Treatments   Labs (all labs ordered are listed, but only abnormal results are displayed) Labs Reviewed  ETHANOL  PROTIME-INR  APTT  CBC  DIFFERENTIAL  COMPREHENSIVE METABOLIC PANEL  RAPID URINE DRUG SCREEN, HOSP PERFORMED  URINALYSIS, ROUTINE W REFLEX MICROSCOPIC  TROPONIN I (HIGH SENSITIVITY)    EKG None  Radiology No results found.  Procedures Procedures  {Document cardiac monitor, telemetry assessment procedure when appropriate:1}  Medications Ordered in ED Medications  meclizine (ANTIVERT) tablet 25 mg (has no administration in time range)    ED Course/ Medical Decision Making/ A&P   {   Click here for ABCD2, HEART and other calculatorsREFRESH Note before signing :1}                 NIH Stroke Scale: 0              Medical Decision Making Amount and/or Complexity of Data Reviewed Labs: ordered. Radiology: ordered.  This patient presents to the ED for concern of dizziness, this involves an extensive number of treatment options, and is a complaint that carries with it a high risk of complications and morbidity.  The differential diagnosis includes but is not limited to CVA, intracranial hemorrhage, acute coronary syndrome, renal failure, urinary tract infection, electrolyte disturbance, pneumonia   Comorbidities that complicate the patient evaluation: Patient's presentation is complicated by their history of hypertension  Social Determinants of Health: Patient's  family stressors   increases the complexity of managing their presentation  Additional history obtained: Records reviewed previous admission documents  Lab Tests: I Ordered, and personally interpreted labs.  The pertinent results include:  ***  Imaging Studies ordered: I ordered imaging studies including CT scan  head and X-ray chest   I independently visualized and interpreted imaging which showed *** I agree with the radiologist interpretation  Cardiac Monitoring: The patient was maintained on a cardiac monitor.  I personally viewed and interpreted the cardiac monitor which showed an underlying rhythm of:  {cardiac monitor:26849}  Medicines ordered and prescription drug management: I ordered medication including ***  for ***  Reevaluation of the patient after these medicines showed that the patient    {resolved/improved/worsened:23923::"improved"}  Test Considered: Patient is low risk / negative by ***, therefore do not feel that *** is indicated.  Critical Interventions:  ***  Consultations Obtained: I requested consultation with the {consultation:26851}, and discussed  findings as well as pertinent plan - they recommend: ***  Reevaluation: After the interventions noted above, I reevaluated the patient and found that they have :{resolved/improved/worsened:23923::"improved"}  Complexity of problems addressed: Patient's presentation is most consistent with  acute presentation with potential threat to life or bodily function  Disposition: After consideration of the diagnostic results and the patient's response to treatment,  I feel that the patent would benefit from {disposition:26850}.     {Document critical care time when appropriate:1} {Document review of labs and clinical decision tools ie heart score, Chads2Vasc2 etc:1}  {Document your independent review of radiology images, and any outside records:1} {Document your discussion with family members, caretakers, and with consultants:1} {Document social determinants of health affecting pt's care:1} {Document your decision making why or why not admission, treatments were needed:1} Final Clinical Impression(s) / ED Diagnoses Final diagnoses:  None    Rx / DC Orders ED Discharge Orders     None

## 2023-10-22 NOTE — ED Notes (Signed)
Patient transported to CT 

## 2023-10-22 NOTE — ED Triage Notes (Signed)
Pt BIB EMS from home. C/o hypertension. HA, skin cool to touch, dry mouth, Dizziness, EMS reports pt unable to stand very wobby, equal grips. Pt says L Leg is swollen but this is normal for her.  Reports being compliant with meds.  Hx stoke x31yrs ago  EMS VS 240/120 with EMS HR 60's, 242/140 EMS gave 4mg  Zofran po

## 2023-10-23 ENCOUNTER — Other Ambulatory Visit: Payer: Self-pay

## 2023-10-23 LAB — COMPREHENSIVE METABOLIC PANEL
ALT: 13 U/L (ref 0–44)
AST: 23 U/L (ref 15–41)
Albumin: 3.8 g/dL (ref 3.5–5.0)
Alkaline Phosphatase: 71 U/L (ref 38–126)
Anion gap: 10 (ref 5–15)
BUN: 11 mg/dL (ref 8–23)
CO2: 20 mmol/L — ABNORMAL LOW (ref 22–32)
Calcium: 8.2 mg/dL — ABNORMAL LOW (ref 8.9–10.3)
Chloride: 106 mmol/L (ref 98–111)
Creatinine, Ser: 1.22 mg/dL — ABNORMAL HIGH (ref 0.44–1.00)
GFR, Estimated: 49 mL/min — ABNORMAL LOW (ref >60)
Glucose, Bld: 163 mg/dL — ABNORMAL HIGH (ref 70–99)
Potassium: 3.4 mmol/L — ABNORMAL LOW (ref 3.5–5.1)
Sodium: 136 mmol/L (ref 135–145)
Total Bilirubin: 0.4 mg/dL (ref <1.2)
Total Protein: 6.9 g/dL (ref 6.5–8.1)

## 2023-10-23 LAB — TROPONIN I (HIGH SENSITIVITY)
Troponin I (High Sensitivity): 4 ng/L (ref <18)
Troponin I (High Sensitivity): 4 ng/L (ref <18)

## 2023-10-23 MED ORDER — ONDANSETRON HCL 4 MG/2ML IJ SOLN
4.0000 mg | Freq: Once | INTRAMUSCULAR | Status: AC
  Administered 2023-10-23: 4 mg via INTRAVENOUS
  Filled 2023-10-23: qty 2

## 2023-10-23 MED ORDER — LORAZEPAM 1 MG PO TABS
0.5000 mg | ORAL_TABLET | Freq: Once | ORAL | Status: AC
  Administered 2023-10-23: 0.5 mg via ORAL
  Filled 2023-10-23: qty 1

## 2023-10-23 MED ORDER — MECLIZINE HCL 25 MG PO TABS: 25.0000 mg | ORAL_TABLET | Freq: Two times a day (BID) | ORAL | 0 refills | Status: AC | PRN

## 2023-10-23 NOTE — Discharge Instructions (Signed)

## 2023-10-23 NOTE — ED Notes (Signed)
Pt reports no change or relief of dizziness since arrival.

## 2023-10-24 DIAGNOSIS — M5416 Radiculopathy, lumbar region: Secondary | ICD-10-CM | POA: Diagnosis not present

## 2023-10-24 DIAGNOSIS — G8929 Other chronic pain: Secondary | ICD-10-CM | POA: Diagnosis not present

## 2023-10-25 DIAGNOSIS — R110 Nausea: Secondary | ICD-10-CM | POA: Diagnosis not present

## 2023-10-25 DIAGNOSIS — I10 Essential (primary) hypertension: Secondary | ICD-10-CM | POA: Diagnosis not present

## 2023-10-25 DIAGNOSIS — K219 Gastro-esophageal reflux disease without esophagitis: Secondary | ICD-10-CM | POA: Diagnosis not present

## 2023-10-25 DIAGNOSIS — R42 Dizziness and giddiness: Secondary | ICD-10-CM | POA: Diagnosis not present

## 2023-10-30 ENCOUNTER — Other Ambulatory Visit: Payer: Self-pay

## 2023-10-31 ENCOUNTER — Other Ambulatory Visit (INDEPENDENT_AMBULATORY_CARE_PROVIDER_SITE_OTHER): Payer: PPO

## 2023-10-31 ENCOUNTER — Encounter: Payer: Self-pay | Admitting: Gastroenterology

## 2023-10-31 ENCOUNTER — Ambulatory Visit: Payer: PPO | Admitting: Gastroenterology

## 2023-10-31 VITALS — BP 155/100 | HR 63 | Ht 66.0 in | Wt 195.0 lb

## 2023-10-31 DIAGNOSIS — K529 Noninfective gastroenteritis and colitis, unspecified: Secondary | ICD-10-CM | POA: Diagnosis not present

## 2023-10-31 DIAGNOSIS — R1319 Other dysphagia: Secondary | ICD-10-CM

## 2023-10-31 DIAGNOSIS — R197 Diarrhea, unspecified: Secondary | ICD-10-CM | POA: Diagnosis not present

## 2023-10-31 DIAGNOSIS — Z800 Family history of malignant neoplasm of digestive organs: Secondary | ICD-10-CM

## 2023-10-31 DIAGNOSIS — R110 Nausea: Secondary | ICD-10-CM

## 2023-10-31 DIAGNOSIS — K219 Gastro-esophageal reflux disease without esophagitis: Secondary | ICD-10-CM

## 2023-10-31 DIAGNOSIS — Z9049 Acquired absence of other specified parts of digestive tract: Secondary | ICD-10-CM

## 2023-10-31 DIAGNOSIS — R1310 Dysphagia, unspecified: Secondary | ICD-10-CM | POA: Diagnosis not present

## 2023-10-31 LAB — COMPREHENSIVE METABOLIC PANEL
ALT: 11 U/L (ref 0–35)
AST: 18 U/L (ref 0–37)
Albumin: 4.3 g/dL (ref 3.5–5.2)
Alkaline Phosphatase: 76 U/L (ref 39–117)
BUN: 13 mg/dL (ref 6–23)
CO2: 27 meq/L (ref 19–32)
Calcium: 8.8 mg/dL (ref 8.4–10.5)
Chloride: 104 meq/L (ref 96–112)
Creatinine, Ser: 1.32 mg/dL — ABNORMAL HIGH (ref 0.40–1.20)
GFR: 42.25 mL/min — ABNORMAL LOW (ref >60.00)
Glucose, Bld: 109 mg/dL — ABNORMAL HIGH (ref 70–99)
Potassium: 4 meq/L (ref 3.5–5.1)
Sodium: 140 meq/L (ref 135–145)
Total Bilirubin: 0.4 mg/dL (ref 0.2–1.2)
Total Protein: 7.3 g/dL (ref 6.0–8.3)

## 2023-10-31 LAB — TSH: TSH: 4.3 u[IU]/mL (ref 0.35–5.50)

## 2023-10-31 LAB — CBC
HCT: 41.8 % (ref 36.0–46.0)
Hemoglobin: 14.2 g/dL (ref 12.0–15.0)
MCHC: 34 g/dL (ref 30.0–36.0)
MCV: 91.8 fL (ref 78.0–100.0)
Platelets: 275 10*3/uL (ref 150.0–400.0)
RBC: 4.55 Mil/uL (ref 3.87–5.11)
RDW: 14.2 % (ref 11.5–15.5)
WBC: 8.8 10*3/uL (ref 4.0–10.5)

## 2023-10-31 LAB — CORTISOL: Cortisol, Plasma: 7.1 ug/dL

## 2023-10-31 MED ORDER — CHOLESTYRAMINE 4 G PO PACK: 4.0000 g | PACK | Freq: Three times a day (TID) | ORAL | 12 refills | Status: AC

## 2023-10-31 MED ORDER — RABEPRAZOLE SODIUM 20 MG PO TBEC: 20.0000 mg | DELAYED_RELEASE_TABLET | Freq: Two times a day (BID) | ORAL | 2 refills | Status: DC

## 2023-10-31 NOTE — Patient Instructions (Signed)
Stop Pantoprazole.   Start Aciphex.   We have sent the following medications to your pharmacy for you to pick up at your convenience: Cholestyramine, Aciphex   You have been scheduled for an endoscopy. Please follow written instructions given to you at your visit today.  If you use inhalers (even only as needed), please bring them with you on the day of your procedure.  If you take any of the following medications, they will need to be adjusted prior to your procedure:   DO NOT TAKE 7 DAYS PRIOR TO TEST- Trulicity (dulaglutide) Ozempic, Wegovy (semaglutide) Mounjaro (tirzepatide) Bydureon Bcise (exanatide extended release)  DO NOT TAKE 1 DAY PRIOR TO YOUR TEST Rybelsus (semaglutide) Adlyxin (lixisenatide) Victoza (liraglutide) Byetta (exanatide) ___________________________________________________________________________   Your provider has requested that you go to the basement level for lab work before leaving today. Press "B" on the elevator. The lab is located at the first door on the left as you exit the elevator.  Due to recent changes in healthcare laws, you may see the results of your imaging and laboratory studies on MyChart before your provider has had a chance to review them.  We understand that in some cases there may be results that are confusing or concerning to you. Not all laboratory results come back in the same time frame and the provider may be waiting for multiple results in order to interpret others.  Please give [place] 48 hours in order for your provider to thoroughly review all the results before contacting the office for clarification of your results.   _______________________________________________________  If your blood pressure at your visit was 140/90 or greater, please contact your primary care physician to follow up on this.  _______________________________________________________  If you are age 66 or older, your body mass index should be between 23-30.  Your Body mass index is 31.47 kg/m. If this is out of the aforementioned range listed, please consider follow up with your Primary Care Provider.  If you are age 66 or younger, your body mass index should be between 19-25. Your Body mass index is 31.47 kg/m. If this is out of the aformentioned range listed, please consider follow up with your Primary Care Provider.   ________________________________________________________  The [facility] providers would like to encourage you to use [facility] to communicate with providers for non-urgent requests or questions.  Due to long hold times on the telephone, sending your provider a message by [facility] may be a faster and more efficient way to get a response.  Please allow 48 business hours for a response.  Please remember that this is for non-urgent requests.  _______________________________________________________  Thank you for choosing me and [facility].  Dr. Meridee Score

## 2023-10-31 NOTE — Progress Notes (Signed)
She stays hoarse over time  GASTROENTEROLOGY OUTPATIENT CLINIC VISIT   Primary Care Provider Garlan Fillers, MD [address] [place] [address] [phone]  Referring Provider Garlan Fillers, MD [address] [place],  [place] [address] [phone]  Patient Profile: Natalie Bond is a 67 y.o. female with a pmh significant for significant for prior possible CVA, s/p thyroidectomy, hyperlipidemia, nephrolithiasis, hypertension, OSA, anxiety, OA, DJD, GERD, s/p Cholecystectomy, FHx Colon Cancer (sister).  The patient presents to the [facility] for an evaluation and management of problem(s) noted below:  Problem List 1. Gastroesophageal reflux disease, unspecified whether esophagitis present   2. Esophageal dysphagia   3. Nausea   4. Chronic diarrhea   5. History of cholecystectomy   6. Family history of colon cancer    Discussed the use of AI scribe software for clinical note transcription with the patient, who gave verbal consent to proceed.  History of Present Illness This is the patient's first visit to the [facility] GI clinic.  The patient presents for new-patient evaluation and transition of care.    The patient has circled multiple issues for discussion including abdominal pain, acid reflux, bloating, difficulty swallowing, nausea, chronic changes in bowel habits with concern for IBS.  The main issue however, patient feels needs upfront workup is symptomatology of GERD breaking through PPI use.  She has been on twice daily Pantoprazole for years.  She has had episodes requiring additional OTC medications to try and help with relief.  Within the last 2 weeks, she had such a severe episode of nocturnal reflux that woke her from sleep, with a feeling of "hot lava" in her throat.  This episode was associated with choking and difficulty breathing.  She chews gum at night to try and help with her symptoms.  She has difficulty at times with solid food  dysphagia (not daily) but it occurs enough that she is aware.  She recounts prior endoscopic dilations but none in years.  She had an EGD performed in early 2023 with findings as below but noted to be normal (no biopsies or dilation performed).  Additionally, the patient has been experiencing diarrhea, which she reports occurs within 30 minutes of eating.  This symptom has been present since her cholecystectomy four years ago.  The patient describes her stools as having a greasy film on top and floating.  She has between 2-4 bowel movements daily.  She has not noted any blood or mucous with her bowel movements.  She does not have nocturnal bowel habits.  She had a reported colonoscopy last year as well with her endoscopy and stated that it was normal (we do not have access to this) but no biopsies were obtained, and she was told to follow up in 5-years.  She has had negative stool infectious culture data however in the past, but no breath testing.  She has never been on cholestyramine or colestipol.   GI Review of Systems Positive as above Negative for melena  Review of Systems General: Denies fevers/chills/weight loss unintentionally Cardiovascular: Denies current chest pain Pulmonary: Denies shortness of breath Gastroenterological: See HPI Genitourinary: Denies darkened urine Hematological: Denies easy bruising/bleeding Dermatological: Denies jaundice Psychological: Mood is stable   Medications Current Outpatient Medications  Medication Sig Dispense Refill   acetaminophen (TYLENOL) 650 MG CR tablet Take 1,300 mg by mouth every 8 (eight) hours as needed for pain.     amLODipine (NORVASC) 5 MG tablet Take 1 tablet by mouth once daily 30  tablet 0   aspirin 81 MG tablet Take 81 mg by mouth daily.     butalbital-acetaminophen-caffeine (FIORICET) 50-325-40 MG tablet Take 1 tablet by mouth every 12 (twelve) hours as needed for headache. 14 tablet 0   calcium carbonate (TUMS) 500 MG chewable tablet  Chew 2 tablets (400 mg of elemental calcium total) by mouth 3 (three) times daily. 90 tablet 1   cholestyramine (QUESTRAN) 4 g packet Take 1 packet (4 g total) by mouth 3 (three) times daily with meals. 60 each 12   cloNIDine (CATAPRES) 0.1 MG tablet Take 0.1 mg by mouth 2 (two) times daily as needed (If BP gets over 160).     clotrimazole-betamethasone (LOTRISONE) cream Apply 1 Application topically 2 (two) times daily. 30 g 0   cyclobenzaprine (FLEXERIL) 10 MG tablet Take 10 mg by mouth 3 (three) times daily as needed for muscle spasms.     dicyclomine (BENTYL) 20 MG tablet Take 20 mg by mouth 3 (three) times daily as needed for spasms.     famotidine (PEPCID) 20 MG tablet Take 20 mg by mouth 2 (two) times daily.     fluconazole (DIFLUCAN) 150 MG tablet Take 1 tablet (150 mg total) by mouth every 3 (three) days. Can take additional dose three days later if symptoms persist (Patient not taking: Reported on 11/01/2023) 2 tablet 0   gabapentin (NEURONTIN) 300 MG capsule Take 300 mg by mouth at bedtime as needed (pain).     meclizine (ANTIVERT) 25 MG tablet Take 1 tablet (25 mg total) by mouth 2 (two) times daily as needed for dizziness. 20 tablet 0   meloxicam (MOBIC) 7.5 MG tablet Take 7.5 mg by mouth daily as needed for pain.     metoprolol succinate (TOPROL-XL) 100 MG 24 hr tablet Take 100 mg by mouth in the morning and at bedtime. Take with or immediately following a meal.     ondansetron (ZOFRAN) 4 MG tablet Take 1-2 tablets (4-8 mg total) by mouth every 8 (eight) hours as needed for nausea or vomiting. 20 tablet 0   potassium chloride SA (KLOR-CON M) 20 MEQ tablet Take 20 mEq by mouth daily.     Propylene Glycol (SYSTANE COMPLETE) 0.6 % SOLN Place 1 drop into both eyes as needed (dry eyes).     RABEprazole (ACIPHEX) 20 MG tablet Take 1 tablet (20 mg total) by mouth 2 (two) times daily. 60 tablet 2   topiramate (TOPAMAX) 100 MG tablet Take 100 mg by mouth at bedtime.     calcitRIOL (ROCALTROL)  0.5 MCG capsule Take 1 capsule (0.5 mcg total) by mouth 2 (two) times daily. (Patient not taking: Reported on 10/31/2023) 20 capsule 0   sucralfate (CARAFATE) 1 GM/10ML suspension 1 gram BID for 2 weeks 420 mL 1   No current facility-administered medications for this visit.    Allergies Allergies  Allergen Reactions   Dilaudid [Hydromorphone Hcl] Anaphylaxis   Dilaudid [Hydromorphone Hcl] Anaphylaxis    [name]    Penicillins Anaphylaxis and Rash    Has patient had a PCN reaction causing immediate rash, facial/tongue/throat swelling, SOB or lightheadedness with hypotension: Yes Has patient had a PCN reaction causing severe rash involving mucus membranes or skin necrosis: No Has patient had a PCN reaction that required hospitalization Yes Has patient had a PCN reaction occurring within the last 10 years: No If all of the above answers are "NO", then may proceed with Cephalosporin use.    Atorvastatin  myalgias   Carvedilol     groggy   Gemfibrozil      fatigue   Lisinopril      cough   Metformin Hcl     stomach upset   Mobic [Meloxicam]     Heartburn and elevated bp   Valsartan     Headache and caused kidney issues     Histories Past Medical History:  Diagnosis Date   Anxiety    Arthritis    Arthritis    back, wrists   Cancer (HCC)    skin cancer   Carpal tunnel syndrome    Complication of anesthesia    DDD (degenerative disc disease)    Diabetes mellitus without complication (HCC)    does not take meds   Dysrhythmia 1995   irreg hr   Full dentures    GERD (gastroesophageal reflux disease)    Headache    High cholesterol    History of bronchitis    History of kidney stones    2015   Hypertension    Hyperthyroidism    PONV (postoperative nausea and vomiting)    Sleep apnea    pt. does not use a CPAP or BiPAP   Stroke (HCC)    Pt. states possible mini stroke in 2014, no deficits   Past Surgical History:  Procedure Laterality  Date   ABDOMINAL HYSTERECTOMY     BACK SURGERY  11/28/2002   [id number]   BACK SURGERY     CARDIAC CATHETERIZATION     01/27/15 [facility]): EF 60%, normal coronaries.   CARPAL TUNNEL RELEASE  09/20/2012   Procedure: CARPAL TUNNEL RELEASE;  Surgeon: Wyn Forster., MD;  Location: [facility];  Service: Orthopedics;  Laterality: Left;   CARPOMETACARPEL SUSPENSION PLASTY Right 12/12/2018   Procedure: RIGHT THUMB CARPOMETACARPAL (CMC) ARTHROPLASTY;  Surgeon: Tarry Kos, MD;  Location: [facility];  Service: Orthopedics;  Laterality: Right;   CARPOMETACARPEL SUSPENSION PLASTY Left 12/30/2019   Procedure: LEFT THUMB CARPOMETACARPAL (CMC) ARTHROPLASTY;  Surgeon: Tarry Kos, MD;  Location: [facility];  Service: Orthopedics;  Laterality: Left;   CATARACT EXTRACTION, BILATERAL     CHOLECYSTECTOMY  07/2016   done [place]   DORSAL COMPARTMENT RELEASE  09/20/2012   Procedure: RELEASE DORSAL COMPARTMENT (DEQUERVAIN);  Surgeon: Wyn Forster., MD;  Location: [facility];  Service: Orthopedics;  Laterality: Left;   EYE SURGERY     bilat cataracts   EYE SURGERY Right    torn retina repaired   HERNIA REPAIR Left 1990   left groin    LOWER EXTREMITY ANGIOGRAM Left 06/08/2015   Procedure: Ascending Venogram  Iliac Vein ;  Surgeon: Sherren Kerns, MD;  Location: [facility] OR;  Service: Vascular;  Laterality: Left;  ;  TARA- [facility] [employer] / ZACH-[place] HAVE BEEN NOTIFIED/ CONFIRMED   PARTIAL HYSTERECTOMY     SHOULDER SURGERY     THYROIDECTOMY N/A 02/02/2023   Procedure: TOTAL THYROIDECTOMY;  Surgeon: Darnell Level, MD;  Location: [facility];  Service: General;  Laterality: N/A;   TOTAL HIP ARTHROPLASTY Left 10/21/2016   Procedure: LEFT TOTAL HIP ARTHROPLASTY ANTERIOR APPROACH;  Surgeon: [name], MD;  Location: [facility];  Service: Orthopedics;  Laterality: Left;   WRIST SURGERY Bilateral    carpal tunnel   Social History    Socioeconomic History   Marital status: Married    Spouse name: marty   Number of children: 2   Years of  education: college   Highest education level: Not on file  Occupational History   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   Occupation: retired  Tobacco Use   Smoking status: Never   Smokeless tobacco: Never  Vaping Use   Vaping status: Never Used  Substance and Sexual Activity   Alcohol use: No   Drug use: No   Sexual activity: Yes    Birth control/protection: Surgical  Other Topics Concern   Not on file  Social History Narrative   ** Merged History Encounter **       Social Determinants of Health   Financial Resource Strain: Low Risk  (01/18/2022)   Overall Financial Resource Strain (CARDIA)    Difficulty of Paying Living Expenses: Not hard at all  Food Insecurity: No Food Insecurity (02/02/2023)   Hunger Vital Sign    Worried About Running Out of Food in the Last Year: Never true    Ran Out of Food in the Last Year: Never true  Transportation Needs: No Transportation Needs (02/02/2023)   PRAPARE - Administrator, Civil Service (Medical): No    Lack of Transportation (Non-Medical): No  Physical Activity: Insufficiently Active (01/18/2022)   Exercise Vital Sign    Days of Exercise per Week: 3 days    Minutes of Exercise per Session: 20 min  Stress: Not on file  Social Connections: Unknown (04/08/2022)   Received from [facility]   Social Network    Social Network: Not on file  Intimate Partner Violence: Not At Risk (02/02/2023)   Humiliation, Afraid, Rape, and Kick questionnaire    Fear of Current or Ex-Partner: No    Emotionally Abused: No    Physically Abused: No    Sexually Abused: No   Family History  Problem Relation Age of Onset   Cancer Mother    Diabetes Mother    Heart disease Mother    Hyperlipidemia Mother    Hypertension Mother    Cancer Father    Diabetes Father    Heart disease Father     Hyperlipidemia Father    Hypertension Father    Varicose Veins Father    Colon cancer Sister    Hypertension Sister    Cancer Sister        colon   Heart disease Sister    Hyperlipidemia Sister    Hypertension Brother    Cancer Brother    Diabetes Brother    Hyperlipidemia Brother    Cancer Brother        pancreatic   Esophageal cancer Neg Hx    Inflammatory bowel disease Neg Hx    Liver disease Neg Hx    Prostate cancer Neg Hx    Stomach cancer Neg Hx    I have reviewed her medical, social, and family history in detail and updated the electronic medical record as necessary.    PHYSICAL EXAMINATION  BP (!) 155/100   Pulse 63   Ht 5\' 6"  (1.676 m)   Wt 195 lb (88.5 kg)   BMI 31.47 kg/m  Wt Readings from Last 3 Encounters:  11/01/23 195 lb (88.5 kg)  10/31/23 195 lb (88.5 kg)  10/23/23 194 lb 0.1 oz (88 kg)  GEN: NAD, appears stated age, doesn't appear chronically ill PSYCH: Cooperative, without pressured speech EYE: Conjunctivae pink, sclerae anicteric ENT: MMM CV: Nontachycardic RESP: No audible wheezing GI: NABS, soft, NT/ND, without rebound or guarding MSK/EXT: Again lower extremity edema SKIN: No  jaundice NEURO:  Alert & Oriented x 3, no focal deficits   REVIEW OF DATA  I reviewed the following data at the time of this encounter:  GI Procedures and Studies  April 2023 EGD Normal esophagus without evidence of esophagitis Normal Z-line The stomach is normal. Normal EGD to second portion of duodenum. Plan increase PPI to twice daily and follow-up with Dr. Charm Barges  Outside records of a clinic appointment from April 2023 with the patient having issues of abdominal pain and diarrhea and need for colon cancer screening with reported family history of colon cancer. Workup included obtaining a GI pathogen panel and C. difficile toxin which were both negative per report and to undergo an EGD and colonoscopy.  She was started on dicyclomine as well.  Outside  colonoscopy report not in records  Laboratory Studies  Reviewed those in epic  Imaging Studies  2023 MRI/MRCP IMPRESSION: 1. Mild intra and extrahepatic biliary ductal dilatation, the common bile duct measuring up to 1.0 cm. No obstructing lesion to the ampulla. This is most likely benign post cholecystectomy change. 2. Status post cholecystectomy. 3. Hepatic steatosis.  2023 CT abdomen pelvis without contrast IMPRESSION: 1. Degraded evaluation of the pelvis secondary to beam hardening artifact from left hip arthroplasty. 2. Given this limitation, no urinary tract calculi or obstructive uropathy. 3. Cholecystectomy with mild biliary duct dilatation. Consider correlation with bilirubin levels. If elevated, consider MRCP.   ASSESSMENT  Ms. Riebeling is a 67 y.o. female with a pmh significant for significant for prior possible CVA, s/p thyroidectomy, hyperlipidemia, nephrolithiasis, hypertension, OSA, anxiety, OA, DJD, GERD, s/p Cholecystectomy, FHx Colon Cancer (sister).  The patient is seen today for evaluation and management of:  1. Gastroesophageal reflux disease, unspecified whether esophagitis present   2. Esophageal dysphagia   3. Nausea   4. Chronic diarrhea   5. History of cholecystectomy   6. Family history of colon cancer    The patient is hemodynamically stable.  Clinically, she has significant GERD-like symptoms on PPI that are progressing.  Last endoscopy report had suggested relatively benign findings.  Would like to update this as a result of her progressive symptoms on high-dose therapy and also consider switching her PPI therapy.  If she has a hiatal hernia that may need treatment.  Time will tell.  Her chronic bowel habit changes most likely a result of postcholecystectomy state and we will initiate bile salt sequestrant to help with this.  SIBO breath testing and EPI need to be ruled out as well (she had a previous [facility]).  IBS/D should also be considered too with Xifaxan therapy in future.  Could also consider whether her GERD symptoms are bile salt derived to mention whether she may be having bile salt reflux initiation of bile salt sequestrant could also be helpful in that regard.  Will consider the role of pH impedance testing if her endoscopy is unremarkable and she has no evidence of EOE either.  The risks and benefits of endoscopic evaluation were discussed with the patient; these include but are not limited to the risk of perforation, infection, bleeding, missed lesions, lack of diagnosis, severe illness requiring hospitalization, as well as anesthesia and sedation related illnesses.  The patient and/or family is agreeable to proceed.  All patient questions were answered to the best of my ability, and the patient agrees to the aforementioned plan of action with follow-up as indicated.   PLAN  Laboratories as outlined below Stool studies  as outlined below Diagnostic endoscopy in setting of progressive GERD symptoms on PPI therapy Initiate transition of PPI to Aciphex 20 mg twice daily (take 30 minutes before meals) Initiate cholestyramine 1 packet daily and may titrate upwards as needed   Orders Placed This Encounter  Procedures   CBC   Comp Met (CMET)   TSH   Cortisol   IgA   Tissue transglutaminase, IgA   Pancreatic elastase, fecal   Fecal fat, qualitative   Ambulatory referral to Gastroenterology    Modified Medications   No medications on file    Planned Follow Up No follow-ups on file.   Total Time in Face-to-Face and in Coordination of Care for patient including independent/personal interpretation/review of prior testing, medical history, examination, medication adjustment, communicating results with the patient directly, and documentation within the EHR is 45 minutes.   Corliss Parish, MD [facility] Office # [phone]

## 2023-11-01 ENCOUNTER — Ambulatory Visit: Payer: PPO

## 2023-11-01 ENCOUNTER — Encounter: Payer: Self-pay | Admitting: Gastroenterology

## 2023-11-01 ENCOUNTER — Ambulatory Visit: Payer: PPO | Admitting: Gastroenterology

## 2023-11-01 VITALS — BP 140/79 | HR 58 | Temp 99.0°F | Resp 15 | Ht 66.0 in | Wt 195.0 lb

## 2023-11-01 DIAGNOSIS — E119 Type 2 diabetes mellitus without complications: Secondary | ICD-10-CM | POA: Diagnosis not present

## 2023-11-01 DIAGNOSIS — Z9049 Acquired absence of other specified parts of digestive tract: Secondary | ICD-10-CM | POA: Diagnosis not present

## 2023-11-01 DIAGNOSIS — K222 Esophageal obstruction: Secondary | ICD-10-CM | POA: Diagnosis not present

## 2023-11-01 DIAGNOSIS — R197 Diarrhea, unspecified: Secondary | ICD-10-CM

## 2023-11-01 DIAGNOSIS — I10 Essential (primary) hypertension: Secondary | ICD-10-CM | POA: Diagnosis not present

## 2023-11-01 DIAGNOSIS — K3189 Other diseases of stomach and duodenum: Secondary | ICD-10-CM | POA: Diagnosis not present

## 2023-11-01 DIAGNOSIS — K219 Gastro-esophageal reflux disease without esophagitis: Secondary | ICD-10-CM | POA: Diagnosis not present

## 2023-11-01 DIAGNOSIS — R1310 Dysphagia, unspecified: Secondary | ICD-10-CM

## 2023-11-01 DIAGNOSIS — K2950 Unspecified chronic gastritis without bleeding: Secondary | ICD-10-CM

## 2023-11-01 DIAGNOSIS — R110 Nausea: Secondary | ICD-10-CM

## 2023-11-01 DIAGNOSIS — K317 Polyp of stomach and duodenum: Secondary | ICD-10-CM | POA: Diagnosis not present

## 2023-11-01 DIAGNOSIS — G4730 Sleep apnea, unspecified: Secondary | ICD-10-CM | POA: Diagnosis not present

## 2023-11-01 DIAGNOSIS — K2100 Gastro-esophageal reflux disease with esophagitis, without bleeding: Secondary | ICD-10-CM

## 2023-11-01 DIAGNOSIS — F419 Anxiety disorder, unspecified: Secondary | ICD-10-CM | POA: Diagnosis not present

## 2023-11-01 DIAGNOSIS — K2970 Gastritis, unspecified, without bleeding: Secondary | ICD-10-CM

## 2023-11-01 DIAGNOSIS — K229 Disease of esophagus, unspecified: Secondary | ICD-10-CM | POA: Diagnosis not present

## 2023-11-01 DIAGNOSIS — K449 Diaphragmatic hernia without obstruction or gangrene: Secondary | ICD-10-CM

## 2023-11-01 LAB — TISSUE TRANSGLUTAMINASE, IGA: (tTG) Ab, IgA: <1 U/mL

## 2023-11-01 LAB — IGA: Immunoglobulin A: 235 mg/dL (ref 70–320)

## 2023-11-01 MED ORDER — SUCRALFATE 1 GM/10ML PO SUSP: ORAL | 1 refills | Status: AC

## 2023-11-01 MED ORDER — SODIUM CHLORIDE 0.9 % IV SOLN: 500.0000 mL | INTRAVENOUS | Status: AC

## 2023-11-01 NOTE — Progress Notes (Signed)
Called to room to assist during endoscopic procedure.  Patient ID and intended procedure confirmed with present staff. Received instructions for my participation in the procedure from the performing physician.  

## 2023-11-01 NOTE — Patient Instructions (Signed)
   FOLLOW DILATATION DIET GIVEN TO YOU TODAY  BEGIN REFLUX MEDICATION TODAY - SENT TO YOUR PHARMACY FOR YOU TO PICK UP   CARAFATE SLURRY 1 GM TWICE A DAY FOR 2 WEEKS   Continue medications  HANDOUTS ON ESOPHAGITIS ,GASTRITIS ,& HIATAL HERNIA GIVEN TO YOU TODAY    YOU HAD AN ENDOSCOPIC PROCEDURE TODAY AT THE [facility]:   Refer to the procedure report that was given to you for any specific questions about what was found during the examination.  If the procedure report does not answer your questions, please call your gastroenterologist to clarify.  If you requested that your care partner not be given the details of your procedure findings, then the procedure report has been included in a sealed envelope for you to review at your convenience later.  YOU SHOULD EXPECT: Some feelings of bloating in the abdomen. Passage of more gas than usual.  Walking can help get rid of the air that was put into your GI tract during the procedure and reduce the bloating. If you had a lower endoscopy (such as a colonoscopy or flexible sigmoidoscopy) you may notice spotting of blood in your stool or on the toilet paper. If you underwent a bowel prep for your procedure, you may not have a normal bowel movement for a few days.  Please Note:  You might notice some irritation and congestion in your nose or some drainage.  This is from the oxygen used during your procedure.  There is no need for concern and it should clear up in a day or so.  SYMPTOMS TO REPORT IMMEDIATELY:   Following upper endoscopy (EGD)  Vomiting of blood or coffee ground material  New chest pain or pain under the shoulder blades  Painful or persistently difficult swallowing  New shortness of breath  Fever of 100F or higher  Black, tarry-looking stools  For urgent or emergent issues, a gastroenterologist can be reached at any hour by calling [phone]. Do not use MyChart messaging for urgent concerns.    DIET: FOLLOW  DILATATION DIET GIVEN TO YOU TODAY .  Drink plenty of fluids but you should avoid alcoholic beverages for 24 hours.  ACTIVITY:  You should plan to take it easy for the rest of today and you should NOT DRIVE or use heavy machinery until tomorrow (because of the sedation medicines used during the test).    FOLLOW UP: Our staff will call the number listed on your records the next business day following your procedure.  We will call around 7:15- 8:00 am to check on you and address any questions or concerns that you may have regarding the information given to you following your procedure. If we do not reach you, we will leave a message.     If any biopsies were taken you will be contacted by phone or by letter within the next 1-3 weeks.  Please call us at [phone] if you have not heard about the biopsies in 3 weeks.    SIGNATURES/CONFIDENTIALITY: You and/or your care partner have signed paperwork which will be entered into your electronic medical record.  These signatures attest to the fact that that the information above on your After Visit Summary has been reviewed and is understood.  Full responsibility of the confidentiality of this discharge information lies with you and/or your care-partner.

## 2023-11-01 NOTE — Progress Notes (Signed)
Report to PACU, RN, vss, BBS= Clear.  

## 2023-11-01 NOTE — Op Note (Signed)
[facility] Patient Name: Natalie Bond Procedure Date: 11/01/2023 8:33 AM MRN: [id number] Endoscopist: Corliss Parish , MD, [id number] Age: 67 Referring MD:  Date of Birth: 1956-06-23 Gender: Female Account #: [id number] Procedure:                Upper GI endoscopy Indications:              Epigastric abdominal pain, Dysphagia, Esophageal                            reflux symptoms that persist despite appropriate                            therapy, Diarrhea Medicines:                Monitored Anesthesia Care Procedure:                Pre-Anesthesia Assessment:                           - Prior to the procedure, a History and Physical                            was performed, and patient medications and                            allergies were reviewed. The patient's tolerance of                            previous anesthesia was also reviewed. The risks                            and benefits of the procedure and the sedation                            options and risks were discussed with the patient.                            All questions were answered, and informed consent                            was obtained. Prior Anticoagulants: The patient has                            taken no anticoagulant or antiplatelet agents                            except for aspirin and has taken no anticoagulant                            or antiplatelet agents except for NSAID medication.                            ASA Grade Assessment: III - A patient with severe  systemic disease. After reviewing the risks and                            benefits, the patient was deemed in satisfactory                            condition to undergo the procedure.                           After obtaining informed consent, the endoscope was                            passed under direct vision. Throughout the                            procedure, the patient's blood  pressure, pulse, and                            oxygen saturations were monitored continuously. The                            GIF [id number] #[id number] was introduced through the                            mouth, and advanced to the second part of duodenum.                            The upper GI endoscopy was accomplished without                            difficulty. The patient tolerated the procedure. Scope In: Scope Out: Findings:                 No gross lesions were noted in the majority of the                            esophagus. Biopsies were taken with a cold forceps                            for histology to rule out EOE/LOE.                           LA Grade A (one or more mucosal breaks less than 5                            mm, not extending between tops of 2 mucosal folds)                            esophagitis with no bleeding was found in the very                            distal esophagus.  A non-obstructing Schatzki ring was found at the                            gastroesophageal junction. Disruption biopsies were                            taken circumferentially with a cold forceps for                            histology.                           After the rest of the EGD was completed, a                            guidewire was placed and the scope was withdrawn.                            Dilation was performed in the esophagus with a                            Savary dilator with no resistance at 18 mm. The                            dilation site was examined following endoscope                            reinsertion and showed just below the UES a mild                            mucosal disruption, mild improvement in luminal                            narrowing and no perforation.                           A 3 cm hiatal hernia was present.                           Striped mildly erythematous mucosa without bleeding                             was found in the gastric antrum.                           No other gross lesions were noted in the entire                            examined stomach. Biopsies were taken with a cold                            forceps for histology and Helicobacter pylori  testing.                           A single 6 mm sessile polyp was found in the distal                            portion of the second portion of the duodenum. The                            polyp was removed with a cold snare. Resection and                            retrieval were complete.                           No other gross lesions were noted in the duodenal                            bulb, in the first portion of the duodenum and in                            the second portion of the duodenum. Biopsies were                            taken with a cold forceps for histology. Complications:            No immediate complications. Estimated Blood Loss:     Estimated blood loss was minimal. Impression:               - No gross lesions in the majority of the                            esophagus. Biopsied.                           - LA Grade A esophagitis with no bleeding found in                            the very distal esophagus.                           - Non-obstructing Schatzki ring. Disrupted.                           - Savory dilation performed in the esophagus up to                            18 mm (mild mucosal wrent just below the UES).                           - 3 cm hiatal hernia.                           - Erythematous mucosa in the antrum. No other gross  lesions in the entire stomach. Biopsied.                           - A single duodenal polyp in distal D2. Resected                            and retrieved.                           - No other gross lesions in the duodenal bulb, in                            the first portion of the duodenum and in  the second                            portion of the duodenum. Biopsied. Recommendation:           - The patient will be observed post-procedure,                            until all discharge criteria are met.                           - Discharge patient to home.                           - Patient has a contact number available for                            emergencies. The signs and symptoms of potential                            delayed complications were discussed with the                            patient. Return to normal activities tomorrow.                            Written discharge instructions were provided to the                            patient.                           - Dilation diet as per protocol.                           - Pick up PPI and begin today.                           - Carafate liquid slurry 1 g twice daily for 2                            weeks.                           -  Continue present medications.                           - Await pathology results.                           - If evidence of adenoma is found then repeat EGD                            in 1 year will be recommended.                           - Repeat upper endoscopy PRN for repeat dilation if                            patient has significant improvement in symptoms.                           - If dysphagia symptoms persist, consider                            esophageal manometry.                           - If transition of PPI does not improve pyrosis                            symptoms then consider transition to Voquenza. pH                            impedance testing may need to be considered if that                            does not help either.                           - However I am concerned that her esophagitis is                            mostly related to her hiatal hernia and potential                            surgical options may need to be considered with                             fundoplication as well.                           - The findings and recommendations were discussed                            with the patient.                           - The findings and recommendations were discussed  with the patient's family. Corliss Parish, MD 11/01/2023 9:03:21 AM

## 2023-11-01 NOTE — Progress Notes (Signed)
GASTROENTEROLOGY PROCEDURE H&P NOTE   Primary Care Physician: Garlan Fillers, MD  HPI: Natalie Bond is a 67 y.o. female who presents for EGD for evaluation of dysphagia, and abdominal pain and diarrhea.  Past Medical History:  Diagnosis Date   Anxiety    Arthritis    Arthritis    back, wrists   Cancer (HCC)    skin cancer   Carpal tunnel syndrome    Complication of anesthesia    DDD (degenerative disc disease)    Diabetes mellitus without complication (HCC)    does not take meds   Dysrhythmia 1995   irreg hr   Full dentures    GERD (gastroesophageal reflux disease)    Headache    High cholesterol    History of bronchitis    History of kidney stones    2015   Hypertension    Hyperthyroidism    PONV (postoperative nausea and vomiting)    Sleep apnea    pt. does not use a CPAP or BiPAP   Stroke (HCC)    Pt. states possible mini stroke in 2014, no deficits   Past Surgical History:  Procedure Laterality Date   ABDOMINAL HYSTERECTOMY     BACK SURGERY  11/28/2002   [id number]   BACK SURGERY     CARDIAC CATHETERIZATION     01/27/15 [facility]): EF 60%, normal coronaries.   CARPAL TUNNEL RELEASE  09/20/2012   Procedure: CARPAL TUNNEL RELEASE;  Surgeon: Wyn Forster., MD;  Location: [facility];  Service: Orthopedics;  Laterality: Left;   CARPOMETACARPEL SUSPENSION PLASTY Right 12/12/2018   Procedure: RIGHT THUMB CARPOMETACARPAL (CMC) ARTHROPLASTY;  Surgeon: Tarry Kos, MD;  Location: [facility];  Service: Orthopedics;  Laterality: Right;   CARPOMETACARPEL SUSPENSION PLASTY Left 12/30/2019   Procedure: LEFT THUMB CARPOMETACARPAL (CMC) ARTHROPLASTY;  Surgeon: Tarry Kos, MD;  Location: [facility];  Service: Orthopedics;  Laterality: Left;   CATARACT EXTRACTION, BILATERAL     CHOLECYSTECTOMY  07/2016   done [place]   DORSAL COMPARTMENT RELEASE  09/20/2012   Procedure: RELEASE DORSAL COMPARTMENT (DEQUERVAIN);   Surgeon: Wyn Forster., MD;  Location: [facility];  Service: Orthopedics;  Laterality: Left;   EYE SURGERY     bilat cataracts   EYE SURGERY Right    torn retina repaired   HERNIA REPAIR Left 1990   left groin    LOWER EXTREMITY ANGIOGRAM Left 06/08/2015   Procedure: Ascending Venogram  Iliac Vein ;  Surgeon: Sherren Kerns, MD;  Location: [facility];  Service: Vascular;  Laterality: Left;  ;  [facility]- [facility] / [facility]ACH-[facility] HAVE BEEN NOTIFIED/ CONFIRMED   PARTIAL HYSTERECTOMY     SHOULDER SURGERY     THYROIDECTOMY N/A 02/02/2023   Procedure: TOTAL THYROIDECTOMY;  Surgeon: Darnell Level, MD;  Location: [facility];  Service: General;  Laterality: N/A;   TOTAL HIP ARTHROPLASTY Left 10/21/2016   Procedure: LEFT TOTAL HIP ARTHROPLASTY ANTERIOR APPROACH;  Surgeon: [name], MD;  Location: [facility];  Service: Orthopedics;  Laterality: Left;   WRIST SURGERY Bilateral    carpal tunnel   Current Outpatient Medications  Medication Sig Dispense Refill   acetaminophen (TYLENOL) 650 MG CR tablet Take 1,300 mg by mouth every 8 (eight) hours as needed for pain.     amLODipine (NORVASC) 5 MG tablet Take 1 tablet by mouth once daily 30 tablet 0   aspirin 81 MG tablet Take 81  mg by mouth daily.     butalbital-acetaminophen-caffeine (FIORICET) 50-325-40 MG tablet Take 1 tablet by mouth every 12 (twelve) hours as needed for headache. 14 tablet 0   calcitRIOL (ROCALTROL) 0.5 MCG capsule Take 1 capsule (0.5 mcg total) by mouth 2 (two) times daily. (Patient not taking: Reported on 10/31/2023) 20 capsule 0   calcium carbonate (TUMS) 500 MG chewable tablet Chew 2 tablets (400 mg of elemental calcium total) by mouth 3 (three) times daily. 90 tablet 1   cholestyramine (QUESTRAN) 4 g packet Take 1 packet (4 g total) by mouth 3 (three) times daily with meals. 60 each 12   cloNIDine (CATAPRES) 0.1 MG tablet Take 0.1 mg by mouth 2 (two) times daily as needed (If BP gets over 160).      clotrimazole-betamethasone (LOTRISONE) cream Apply 1 Application topically 2 (two) times daily. 30 g 0   cyclobenzaprine (FLEXERIL) 10 MG tablet Take 10 mg by mouth 3 (three) times daily as needed for muscle spasms.     dicyclomine (BENTYL) 20 MG tablet Take 20 mg by mouth 3 (three) times daily as needed for spasms.     famotidine (PEPCID) 20 MG tablet Take 20 mg by mouth 2 (two) times daily.     fluconazole (DIFLUCAN) 150 MG tablet Take 1 tablet (150 mg total) by mouth every 3 (three) days. Can take additional dose three days later if symptoms persist 2 tablet 0   gabapentin (NEURONTIN) 300 MG capsule Take 300 mg by mouth at bedtime as needed (pain).     meclizine (ANTIVERT) 25 MG tablet Take 1 tablet (25 mg total) by mouth 2 (two) times daily as needed for dizziness. 20 tablet 0   meloxicam (MOBIC) 7.5 MG tablet Take 7.5 mg by mouth daily as needed for pain.     metoprolol succinate (TOPROL-XL) 100 MG 24 hr tablet Take 100 mg by mouth in the morning and at bedtime. Take with or immediately following a meal.     ondansetron (ZOFRAN) 4 MG tablet Take 1-2 tablets (4-8 mg total) by mouth every 8 (eight) hours as needed for nausea or vomiting. 20 tablet 0   potassium chloride SA (KLOR-CON M) 20 MEQ tablet Take 20 mEq by mouth daily.     Propylene Glycol (SYSTANE COMPLETE) 0.6 % SOLN Place 1 drop into both eyes as needed (dry eyes).     RABEprazole (ACIPHEX) 20 MG tablet Take 1 tablet (20 mg total) by mouth 2 (two) times daily. 60 tablet 2   topiramate (TOPAMAX) 100 MG tablet Take 100 mg by mouth at bedtime.     Current Facility-Administered Medications  Medication Dose Route Frequency Provider Last Rate Last Admin   0.9 %  sodium chloride infusion  500 mL Intravenous Continuous Mansouraty, Netty Starring., MD        Current Outpatient Medications:    acetaminophen (TYLENOL) 650 MG CR tablet, Take 1,300 mg by mouth every 8 (eight) hours as needed for pain., Disp: , Rfl:    amLODipine (NORVASC) 5 MG  tablet, Take 1 tablet by mouth once daily, Disp: 30 tablet, Rfl: 0   aspirin 81 MG tablet, Take 81 mg by mouth daily., Disp: , Rfl:    butalbital-acetaminophen-caffeine (FIORICET) 50-325-40 MG tablet, Take 1 tablet by mouth every 12 (twelve) hours as needed for headache., Disp: 14 tablet, Rfl: 0   calcitRIOL (ROCALTROL) 0.5 MCG capsule, Take 1 capsule (0.5 mcg total) by mouth 2 (two) times daily. (Patient not taking: Reported on 10/31/2023), Disp: 20  capsule, Rfl: 0   calcium carbonate (TUMS) 500 MG chewable tablet, Chew 2 tablets (400 mg of elemental calcium total) by mouth 3 (three) times daily., Disp: 90 tablet, Rfl: 1   cholestyramine (QUESTRAN) 4 g packet, Take 1 packet (4 g total) by mouth 3 (three) times daily with meals., Disp: 60 each, Rfl: 12   cloNIDine (CATAPRES) 0.1 MG tablet, Take 0.1 mg by mouth 2 (two) times daily as needed (If BP gets over 160)., Disp: , Rfl:    clotrimazole-betamethasone (LOTRISONE) cream, Apply 1 Application topically 2 (two) times daily., Disp: 30 g, Rfl: 0   cyclobenzaprine (FLEXERIL) 10 MG tablet, Take 10 mg by mouth 3 (three) times daily as needed for muscle spasms., Disp: , Rfl:    dicyclomine (BENTYL) 20 MG tablet, Take 20 mg by mouth 3 (three) times daily as needed for spasms., Disp: , Rfl:    famotidine (PEPCID) 20 MG tablet, Take 20 mg by mouth 2 (two) times daily., Disp: , Rfl:    fluconazole (DIFLUCAN) 150 MG tablet, Take 1 tablet (150 mg total) by mouth every 3 (three) days. Can take additional dose three days later if symptoms persist, Disp: 2 tablet, Rfl: 0   gabapentin (NEURONTIN) 300 MG capsule, Take 300 mg by mouth at bedtime as needed (pain)., Disp: , Rfl:    meclizine (ANTIVERT) 25 MG tablet, Take 1 tablet (25 mg total) by mouth 2 (two) times daily as needed for dizziness., Disp: 20 tablet, Rfl: 0   meloxicam (MOBIC) 7.5 MG tablet, Take 7.5 mg by mouth daily as needed for pain., Disp: , Rfl:    metoprolol succinate (TOPROL-XL) 100 MG 24 hr tablet,  Take 100 mg by mouth in the morning and at bedtime. Take with or immediately following a meal., Disp: , Rfl:    ondansetron (ZOFRAN) 4 MG tablet, Take 1-2 tablets (4-8 mg total) by mouth every 8 (eight) hours as needed for nausea or vomiting., Disp: 20 tablet, Rfl: 0   potassium chloride SA (KLOR-CON M) 20 MEQ tablet, Take 20 mEq by mouth daily., Disp: , Rfl:    Propylene Glycol (SYSTANE COMPLETE) 0.6 % SOLN, Place 1 drop into both eyes as needed (dry eyes)., Disp: , Rfl:    RABEprazole (ACIPHEX) 20 MG tablet, Take 1 tablet (20 mg total) by mouth 2 (two) times daily., Disp: 60 tablet, Rfl: 2   topiramate (TOPAMAX) 100 MG tablet, Take 100 mg by mouth at bedtime., Disp: , Rfl:   Current Facility-Administered Medications:    0.9 %  sodium chloride infusion, 500 mL, Intravenous, Continuous, Mansouraty, Netty Starring., MD Allergies  Allergen Reactions   Dilaudid [Hydromorphone Hcl] Anaphylaxis   Dilaudid [Hydromorphone Hcl] Anaphylaxis    [name]    Penicillins Anaphylaxis and Rash    Has patient had a PCN reaction causing immediate rash, facial/tongue/throat swelling, SOB or lightheadedness with hypotension: Yes Has patient had a PCN reaction causing severe rash involving mucus membranes or skin necrosis: No Has patient had a PCN reaction that required hospitalization Yes Has patient had a PCN reaction occurring within the last 10 years: No If all of the above answers are "NO", then may proceed with Cephalosporin use.    Atorvastatin     myalgias   Carvedilol     groggy   Gemfibrozil      fatigue   Lisinopril      cough   Metformin Hcl     stomach upset   Mobic [Meloxicam]  Heartburn and elevated bp   Valsartan     Headache and caused kidney issues    Family History  Problem Relation Age of Onset   Cancer Mother    Diabetes Mother    Heart disease Mother    Hyperlipidemia Mother    Hypertension Mother    Cancer Father    Diabetes Father    Heart disease  Father    Hyperlipidemia Father    Hypertension Father    Varicose Veins Father    Hypertension Sister    Cancer Sister        colon   Heart disease Sister    Hyperlipidemia Sister    Hypertension Brother    Cancer Brother    Diabetes Brother    Hyperlipidemia Brother    Cancer Brother        pancreatic   Liver disease Neg Hx    Esophageal cancer Neg Hx    Social History   Socioeconomic History   Marital status: Married    Spouse name: Sports administrator   Number of children: 2   Years of education: college   Highest education level: Not on file  Occupational History   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   Occupation: retired  Tobacco Use   Smoking status: Never   Smokeless tobacco: Never  Vaping Use   Vaping status: Never Used  Substance and Sexual Activity   Alcohol use: No   Drug use: No   Sexual activity: Yes    Birth control/protection: Surgical  Other Topics Concern   Not on file  Social History Narrative   ** Merged History Encounter **       Social Determinants of Health   Financial Resource Strain: Low Risk  (01/18/2022)   Overall Financial Resource Strain (CARDIA)    Difficulty of Paying Living Expenses: Not hard at all  Food Insecurity: No Food Insecurity (02/02/2023)   Hunger Vital Sign    Worried About Running Out of Food in the Last Year: Never true    Ran Out of Food in the Last Year: Never true  Transportation Needs: No Transportation Needs (02/02/2023)   PRAPARE - Administrator, Civil Service (Medical): No    Lack of Transportation (Non-Medical): No  Physical Activity: Insufficiently Active (01/18/2022)   Exercise Vital Sign    Days of Exercise per Week: 3 days    Minutes of Exercise per Session: 20 min  Stress: Not on file  Social Connections: Unknown (04/08/2022)   Received from [facility]   Social Network    Social Network: Not on file  Intimate Partner Violence: Not At Risk (02/02/2023)   Humiliation,  Afraid, Rape, and Kick questionnaire    Fear of Current or Ex-Partner: No    Emotionally Abused: No    Physically Abused: No    Sexually Abused: No    Physical Exam: Today's Vitals   11/01/23 0815  BP: (!) 145/86  Pulse: (!) 55  Temp: 99 F (37.2 C)  TempSrc: Oral  SpO2: 99%  Weight: 195 lb (88.5 kg)  Height: 5\' 6"  (1.676 m)   Body mass index is 31.47 kg/m. GEN: NAD EYE: Sclerae anicteric ENT: MMM CV: Non-tachycardic GI: Soft, NT/ND NEURO:  Alert & Oriented x 3  Lab Results: Recent Labs    10/31/23 0916  WBC 8.8  HGB 14.2  HCT 41.8  PLT 275.0   BMET Recent Labs    10/31/23 0916  NA  140  K 4.0  CL 104  CO2 27  GLUCOSE 109*  BUN 13  CREATININE 1.32*  CALCIUM 8.8   LFT Recent Labs    10/31/23 0916  PROT 7.3  ALBUMIN 4.3  AST 18  ALT 11  ALKPHOS 76  BILITOT 0.4   PT/INR No results for input(s): "LABPROT", "INR" in the last 72 hours.   Impression / Plan: This is a 67 y.o.female who presents for EGD for evaluation of dysphagia, and abdominal pain and diarrhea.  The risks and benefits of endoscopic evaluation/treatment were discussed with the patient and/or family; these include but are not limited to the risk of perforation, infection, bleeding, missed lesions, lack of diagnosis, severe illness requiring hospitalization, as well as anesthesia and sedation related illnesses.  The patient's history has been reviewed, patient examined, no change in status, and deemed stable for procedure.  The patient and/or family is agreeable to proceed.    Corliss Parish, MD [facility]oscopy Office # [phone]

## 2023-11-02 ENCOUNTER — Telehealth: Payer: Self-pay

## 2023-11-02 ENCOUNTER — Encounter: Payer: Self-pay | Admitting: Gastroenterology

## 2023-11-02 DIAGNOSIS — R110 Nausea: Secondary | ICD-10-CM | POA: Insufficient documentation

## 2023-11-02 DIAGNOSIS — Z9049 Acquired absence of other specified parts of digestive tract: Secondary | ICD-10-CM | POA: Insufficient documentation

## 2023-11-02 DIAGNOSIS — R1310 Dysphagia, unspecified: Secondary | ICD-10-CM | POA: Insufficient documentation

## 2023-11-02 DIAGNOSIS — Z800 Family history of malignant neoplasm of digestive organs: Secondary | ICD-10-CM | POA: Insufficient documentation

## 2023-11-02 DIAGNOSIS — K219 Gastro-esophageal reflux disease without esophagitis: Secondary | ICD-10-CM | POA: Insufficient documentation

## 2023-11-02 DIAGNOSIS — R197 Diarrhea, unspecified: Secondary | ICD-10-CM | POA: Insufficient documentation

## 2023-11-02 NOTE — Telephone Encounter (Signed)
Patient called with questions about her procedure yesterday, her throat is sore and it feels like something is stuck she explains. I explained that it may be from the inflammation because they dilated her and took biopsies. I encouraged her to try a milkshake or smoothie and that may feel good to her. SHe agreed and will call back with any concerns or questions.

## 2023-11-02 NOTE — Telephone Encounter (Signed)
  Follow up Call-     11/01/2023    8:16 AM  Call back number  Post procedure Call Back phone  # [phone]  Permission to leave phone message Yes    Post op call attempted, no answer, left WM.

## 2023-11-03 ENCOUNTER — Encounter: Payer: Self-pay | Admitting: Gastroenterology

## 2023-11-03 LAB — SURGICAL PATHOLOGY

## 2023-11-04 ENCOUNTER — Encounter: Payer: Self-pay | Admitting: Gastroenterology

## 2023-11-04 LAB — FECAL FAT, QUALITATIVE
Fat Qual Neutral, Stl: NORMAL
Fat Qual Total, Stl: NORMAL

## 2023-11-07 LAB — PANCREATIC ELASTASE, FECAL: Pancreatic Elastase-1, Stool: 779 ug/g (ref >200)

## 2023-11-08 NOTE — Pre-Procedure Instructions (Signed)
Surgical Instructions   Your procedure is scheduled on Tuesday, December 17th. Report to [facility] Main Entrance "A" at 06:45 A.M., then check in with the Admitting office. Any questions or running late day of surgery: call [phone]  Questions prior to your surgery date: call [phone], Monday-Friday, 8am-4pm. If you experience any cold or flu symptoms such as cough, fever, chills, shortness of breath, etc. between now and your scheduled surgery, please notify us at the above number.     Remember:  Do not eat after midnight the night before your surgery  You may drink clear liquids until 05:45 AM the morning of your surgery.   Clear liquids allowed are: Water, Non-Citrus Juices (without pulp), Carbonated Beverages, Clear Tea (no milk, honey, etc.), Black Coffee Only (NO MILK, CREAM OR POWDERED CREAMER of any kind), and Gatorade.  Patient Instructions  The night before surgery:  No food after midnight. ONLY clear liquids after midnight   The day of surgery (if you have diabetes): Drink ONE (1) 12 oz G2 given to you in your pre admission testing appointment by 05:45 AM the morning of surgery. Drink in one sitting. Do not sip.  This drink was given to you during your hospital  pre-op appointment visit.  Nothing else to drink after completing the  12 oz bottle of G2.         If you have questions, please contact your surgeon's office.    Take these medicines the morning of surgery with A SIP OF WATER  amLODipine (NORVASC)  cholestyramine (QUESTRAN)  famotidine (PEPCID)  levothyroxine (SYNTHROID)  metoprolol succinate (TOPROL-XL)  pantoprazole (PROTONIX)  RABEprazole (ACIPHEX)    May take these medicines IF NEEDED: acetaminophen (TYLENOL)  butalbital-acetaminophen-caffeine (FIORICET)  cloNIDine (CATAPRES)  dicyclomine (BENTYL)  HYDROcodone-acetaminophen (NORCO/VICODIN)  meclizine (ANTIVERT)  ondansetron (ZOFRAN)  PARoxetine (PAXIL)  Propylene Glycol (SYSTANE  COMPLETE) eye drops   Follow your surgeon's instructions on when to stop Aspirin.  If no instructions were given by your surgeon then you will need to call the office to get those instructions.    One week prior to surgery, STOP taking any Aleve, Naproxen, Ibuprofen, Motrin, Advil, Goody's, BC's, all herbal medications, fish oil, and non-prescription vitamins. This includes meloxicam (MOBIC).                     Do NOT Smoke (Tobacco/Vaping) for 24 hours prior to your procedure.  If you use a CPAP at night, you may bring your mask/headgear for your overnight stay.   You will be asked to remove any contacts, glasses, piercing's, hearing aid's, dentures/partials prior to surgery. Please bring cases for these items if needed.    Patients discharged the day of surgery will not be allowed to drive home, and someone needs to stay with them for 24 hours.  SURGICAL WAITING ROOM VISITATION Patients may have no more than 2 support people in the waiting area - these visitors may rotate.   Pre-op nurse will coordinate an appropriate time for 1 ADULT support person, who may not rotate, to accompany patient in pre-op.  Children under the age of 27 must have an adult with them who is not the patient and must remain in the main waiting area with an adult.  If the patient needs to stay at the hospital during part of their recovery, the visitor guidelines for inpatient rooms apply.  Please refer to the [facility] website for the visitor guidelines for any additional information.   If  you received a COVID test during your pre-op visit  it is requested that you wear a mask when out in public, stay away from anyone that may not be feeling well and notify your surgeon if you develop symptoms. If you have been in contact with anyone that has tested positive in the last 10 days please notify you surgeon.      Pre-operative 5 CHG Bathing Instructions   You can play a key role in reducing the risk of  infection after surgery. Your skin needs to be as free of germs as possible. You can reduce the number of germs on your skin by washing with CHG (chlorhexidine gluconate) soap before surgery. CHG is an antiseptic soap that kills germs and continues to kill germs even after washing.   DO NOT use if you have an allergy to chlorhexidine/CHG or antibacterial soaps. If your skin becomes reddened or irritated, stop using the CHG and notify one of our RNs at [phone].   Please shower with the CHG soap starting 4 days before surgery using the following schedule:     Please keep in mind the following:  DO NOT shave, including legs and underarms, starting the day of your first shower.   You may shave your face at any point before/day of surgery.  Place clean sheets on your bed the day you start using CHG soap. Use a clean washcloth (not used since being washed) for each shower. DO NOT sleep with pets once you start using the CHG.   CHG Shower Instructions:  Wash your face and private area with normal soap. If you choose to wash your hair, wash first with your normal shampoo.  After you use shampoo/soap, rinse your hair and body thoroughly to remove shampoo/soap residue.  Turn the water OFF and apply about 3 tablespoons (45 ml) of CHG soap to a CLEAN washcloth.  Apply CHG soap ONLY FROM YOUR NECK DOWN TO YOUR TOES (washing for 3-5 minutes)  DO NOT use CHG soap on face, private areas, open wounds, or sores.  Pay special attention to the area where your surgery is being performed.  If you are having back surgery, having someone wash your back for you may be helpful. Wait 2 minutes after CHG soap is applied, then you may rinse off the CHG soap.  Pat dry with a clean towel  Put on clean clothes/pajamas   If you choose to wear lotion, please use ONLY the CHG-compatible lotions on the back of this paper.   Additional instructions for the day of surgery: DO NOT APPLY any lotions, deodorants, cologne,  or perfumes.   Do not bring valuables to the hospital. [facility] is not responsible for any belongings/valuables. Do not wear nail polish, gel polish, artificial nails, or any other type of covering on natural nails (fingers and toes) Do not wear jewelry or makeup Put on clean/comfortable clothes.  Please brush your teeth.  Ask your nurse before applying any prescription medications to the skin.     CHG Compatible Lotions   Aveeno Moisturizing lotion  Cetaphil Moisturizing Cream  Cetaphil Moisturizing Lotion  Clairol Herbal Essence Moisturizing Lotion, Dry Skin  Clairol Herbal Essence Moisturizing Lotion, Extra Dry Skin  Clairol Herbal Essence Moisturizing Lotion, Normal Skin  Curel Age Defying Therapeutic Moisturizing Lotion with Alpha Hydroxy  Curel Extreme Care Body Lotion  Curel Soothing Hands Moisturizing Hand Lotion  Curel Therapeutic Moisturizing Cream, Fragrance-Free  Curel Therapeutic Moisturizing Lotion, Fragrance-Free  Curel Therapeutic Moisturizing Lotion, Original  Formula  Eucerin Daily Replenishing Lotion  Eucerin Dry Skin Therapy Plus Alpha Hydroxy Crme  Eucerin Dry Skin Therapy Plus Alpha Hydroxy Lotion  Eucerin Original Crme  Eucerin Original Lotion  Eucerin Plus Crme Eucerin Plus Lotion  Eucerin TriLipid Replenishing Lotion  Keri Anti-Bacterial Hand Lotion  Keri Deep Conditioning Original Lotion Dry Skin Formula Softly Scented  Keri Deep Conditioning Original Lotion, Fragrance Free Sensitive Skin Formula  Keri Lotion Fast Absorbing Fragrance Free Sensitive Skin Formula  Keri Lotion Fast Absorbing Softly Scented Dry Skin Formula  Keri Original Lotion  Keri Skin Renewal Lotion Keri Silky Smooth Lotion  Keri Silky Smooth Sensitive Skin Lotion  Nivea Body Creamy Conditioning Oil  Nivea Body Extra Enriched Lotion  Nivea Body Original Lotion  Nivea Body Sheer Moisturizing Lotion Nivea Crme  Nivea Skin Firming Lotion  NutraDerm 30 Skin Lotion  NutraDerm  Skin Lotion  NutraDerm Therapeutic Skin Cream  NutraDerm Therapeutic Skin Lotion  ProShield Protective Hand Cream  Provon moisturizing lotion  Please read over the following fact sheets that you were given.

## 2023-11-09 ENCOUNTER — Encounter (HOSPITAL_COMMUNITY): Payer: Self-pay

## 2023-11-09 ENCOUNTER — Encounter (HOSPITAL_COMMUNITY)
Admission: RE | Admit: 2023-11-09 | Discharge: 2023-11-09 | Disposition: A | Discharge status: Home / Self Care | Payer: PPO | Source: Ambulatory Visit | Attending: Orthopaedic Surgery | Admitting: Orthopaedic Surgery

## 2023-11-09 ENCOUNTER — Other Ambulatory Visit: Payer: Self-pay

## 2023-11-09 VITALS — BP 140/77 | HR 55 | Temp 98.4°F | Resp 18 | Ht 66.0 in | Wt 196.2 lb

## 2023-11-09 DIAGNOSIS — Z01812 Encounter for preprocedural laboratory examination: Secondary | ICD-10-CM | POA: Insufficient documentation

## 2023-11-09 DIAGNOSIS — K2100 Gastro-esophageal reflux disease with esophagitis, without bleeding: Secondary | ICD-10-CM | POA: Diagnosis not present

## 2023-11-09 DIAGNOSIS — M1611 Unilateral primary osteoarthritis, right hip: Secondary | ICD-10-CM | POA: Insufficient documentation

## 2023-11-09 DIAGNOSIS — E039 Hypothyroidism, unspecified: Secondary | ICD-10-CM | POA: Diagnosis not present

## 2023-11-09 DIAGNOSIS — F419 Anxiety disorder, unspecified: Secondary | ICD-10-CM | POA: Insufficient documentation

## 2023-11-09 DIAGNOSIS — E119 Type 2 diabetes mellitus without complications: Secondary | ICD-10-CM | POA: Diagnosis not present

## 2023-11-09 DIAGNOSIS — I10 Essential (primary) hypertension: Secondary | ICD-10-CM | POA: Diagnosis not present

## 2023-11-09 DIAGNOSIS — E7800 Pure hypercholesterolemia, unspecified: Secondary | ICD-10-CM | POA: Diagnosis not present

## 2023-11-09 DIAGNOSIS — K449 Diaphragmatic hernia without obstruction or gangrene: Secondary | ICD-10-CM | POA: Diagnosis not present

## 2023-11-09 DIAGNOSIS — Z01818 Encounter for other preprocedural examination: Secondary | ICD-10-CM

## 2023-11-09 DIAGNOSIS — G4733 Obstructive sleep apnea (adult) (pediatric): Secondary | ICD-10-CM | POA: Insufficient documentation

## 2023-11-09 HISTORY — DX: Dizziness and giddiness: R42

## 2023-11-09 HISTORY — DX: Personal history of other diseases of the digestive system: Z87.19

## 2023-11-09 LAB — TYPE AND SCREEN
ABO/RH(D): O POS
Antibody Screen: NEGATIVE

## 2023-11-09 LAB — SURGICAL PCR SCREEN
MRSA, PCR: NEGATIVE
Staphylococcus aureus: POSITIVE — AB

## 2023-11-09 LAB — GLUCOSE, CAPILLARY: Glucose-Capillary: 108 mg/dL — ABNORMAL HIGH (ref 70–99)

## 2023-11-09 LAB — HEMOGLOBIN A1C
Hgb A1c MFr Bld: 6.2 % — ABNORMAL HIGH (ref 4.8–5.6)
Mean Plasma Glucose: 131.24 mg/dL

## 2023-11-09 NOTE — Progress Notes (Signed)
Anesthesia Chart Review:  Case: [id number] Date/Time: 11/14/23 0830   Procedure: RIGHT TOTAL HIP ARTHROPLASTY ANTERIOR APPROACH (Right: Hip)   Anesthesia type: Spinal   Pre-op diagnosis: osteoarthritis right hip   [facility] ROOM 04 / [facility]   Surgeons: [name], MD       DISCUSSION: Patient is a 67 year old female scheduled for the above procedure.   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   TSH 4.30 on 10/31/23. A1c 6.2% on 11/09/23.  Anesthesia team to evaluate on the day of surgery.    VS: BP (!) 140/77   Pulse (!) 55   Temp 36.9 C   Resp 18   Ht 5\' 6"  (1.676 m)   Wt 89 kg   SpO2 100%   BMI 31.67 kg/m    PROVIDERS: Garlan Fillers, MD is PCP  - Chilton Si, MD is cardiologist ([facility]). Initially evaluated 01/18/22 after ED visit for headaches with BP 161/100 despite being on multiple antihypertensives. June 2020 echo showed LVEF 60-65%, grade 1 diastolic dysfunction, mild AV sclerosis. No significant carotid stenosis on 12/2021 [place]. Meds adjusted and had PharmD follow-up. At 05/10/22 follow-up, BP well controlled. Amlodipine decreased due to LE edema. Venous insufficiency thought to also be contributing. BNP was normal.  - Jola Schmidt, MD is Pain Management. S/p L5-S1 Transforaminal Epidural Steroid Injection on 10/24/23.   LABS: Preoperative labs noted. A1c 6.2% on 11/09/23. CMP on 10/31/23 showed stable Creatinine 1.32, CBC was normal.  (all labs ordered are listed, but only abnormal results are displayed)  Labs Reviewed  SURGICAL PCR SCREEN - Abnormal; Notable for the following components:      Result Value    Staphylococcus aureus POSITIVE (*)    All other components within normal limits  HEMOGLOBIN A1C - Abnormal; Notable for the following components:   Hgb A1c MFr Bld 6.2 (*)    All other components within normal limits  GLUCOSE, CAPILLARY - Abnormal; Notable for the following components:   Glucose-Capillary 108 (*)    All other components within normal limits  TYPE AND SCREEN   Lab Results  Component Value Date   WBC 8.8 10/31/2023   HGB 14.2 10/31/2023   HCT 41.8 10/31/2023   PLT 275.0 10/31/2023   GLUCOSE 109 (H) 10/31/2023   ALT 11 10/31/2023   AST 18 10/31/2023   NA 140 10/31/2023   K 4.0 10/31/2023   CL 104 10/31/2023   CREATININE 1.32 (H) 10/31/2023   BUN 13 10/31/2023   CO2 27 10/31/2023   TSH 4.30 10/31/2023   INR 1.0 10/22/2023    OTHER: EGD 11/01/23: IMPRESSION: - No gross lesions in the majority of the esophagus. Biopsied. - LA Grade A esophagitis with no bleeding found in the very distal esophagus. - No obstructing Schatzki ring, Disrupted.  - Savory dilation performed in the esophagus up to 18 mm (mild mucosal wrent just below the UES). - 3 cm hiatal hernia. - Erythematous mucosa in the antrum. No other gross lesions in the entire stomach. Biopsied.  - A single duodenal polyp in distal D2. Resected and retrieved. - No other gross lesions in the duodenal bulb, in the first portion of the duodenum and in the second portion of the duodenum. Biopsied.  IMAGES: 1V PCXR 10/22/23: FINDINGS: Mildly diminished lung volumes. No acute airspace disease or effusion. Minimal atelectasis at the left base. Normal cardiac size. No pneumothorax IMPRESSION: Mildly diminished lung volumes with minimal left base atelectasis.  CT Head 10/22/23: IMPRESSION: 1. No acute intracranial abnormality.   Xray right hip 10/16/23: An AP pelvis and lateral of the right hip shows a well-seated left total hip arthroplasty.  There is more significant arthritic changes on the right hip  with inferior and superior lateral osteophytes.  There is also cystic changes in the acetabulum and joint space narrowing and when compared to previous films.     EKG: 10/22/23: SB at 57 bpm   CV: US Carotid 01/25/22: Summary:  - Right Carotid: The extracranial vessels were near-normal with only minimal wall thickening or plaque.  - Left Carotid: The extracranial vessels were near-normal with only minimal wall thickening or plaque.  - Vertebrals:  Bilateral vertebral arteries demonstrate antegrade flow.  - Subclavians: Normal flow hemodynamics were seen in bilateral subclavian arteries.   Echo 05/28/19: IMPRESSIONS   1. The left ventricle has normal systolic function with an ejection  fraction of 60-65%. The cavity size was normal. Left ventricular diastolic  Doppler parameters are consistent with impaired relaxation. No evidence of  left ventricular regional wall  motion abnormalities.   2. The mitral valve is grossly normal.   3. The tricuspid valve is grossly normal.   4. The aortic valve is tricuspid. Mild sclerosis of the aortic valve. No  stenosis of the aortic valve.   Past Medical History:  Diagnosis Date   Anxiety    Arthritis    Arthritis    back, wrists   Cancer (HCC)    skin cancer   Carpal tunnel syndrome    Complication of anesthesia    DDD (degenerative disc disease)    Diabetes mellitus without complication (HCC)    does not take meds   Dysrhythmia 1995   irreg hr   Full dentures    GERD (gastroesophageal reflux disease)    Headache    High cholesterol    History of bronchitis    History of hiatal hernia    History of kidney stones    2015   Hypertension    Hyperthyroidism    PONV (postoperative nausea and vomiting)    Sleep apnea    pt. does not use a CPAP or BiPAP   Stroke (HCC)    Pt. states possible mini stroke in 2014, no deficits   Vertigo     Past Surgical History:  Procedure Laterality Date   ABDOMINAL HYSTERECTOMY     BACK SURGERY   11/28/2002   [id number]   BACK SURGERY     CARDIAC CATHETERIZATION     01/27/15 [facility]): EF 60%, normal coronaries.   CARPAL TUNNEL RELEASE  09/20/2012   Procedure: CARPAL TUNNEL RELEASE;  Surgeon: Wyn Forster., MD;  Location: [facility];  Service: Orthopedics;  Laterality: Left;   CARPOMETACARPEL SUSPENSION PLASTY Right 12/12/2018   Procedure: RIGHT THUMB CARPOMETACARPAL (CMC) ARTHROPLASTY;  Surgeon: Tarry Kos, MD;  Location: [facility];  Service: Orthopedics;  Laterality: Right;   CARPOMETACARPEL SUSPENSION PLASTY Left 12/30/2019   Procedure: LEFT THUMB CARPOMETACARPAL (CMC) ARTHROPLASTY;  Surgeon: Tarry Kos, MD;  Location: [facility];  Service: Orthopedics;  Laterality: Left;   CATARACT EXTRACTION, BILATERAL     CHOLECYSTECTOMY  07/2016   done [place]   DORSAL COMPARTMENT RELEASE  09/20/2012   Procedure: RELEASE DORSAL COMPARTMENT (DEQUERVAIN);  Surgeon: Wyn Forster., MD;  Location: [facility];  Service: Orthopedics;  Laterality: Left;   EYE SURGERY     bilat cataracts   EYE SURGERY Right    torn retina repaired   HERNIA REPAIR Left 1990   left groin    LOWER EXTREMITY ANGIOGRAM Left 06/08/2015   Procedure: Ascending Venogram  Iliac Vein ;  Surgeon: Sherren Kerns, MD;  Location: [facility] OR;  Service: Vascular;  Laterality: Left;  ;  TARA- [employer] / [place]ACH-[place] HAVE BEEN NOTIFIED/ CONFIRMED   PARTIAL HYSTERECTOMY     SHOULDER SURGERY Left    THYROIDECTOMY N/A 02/02/2023   Procedure: TOTAL THYROIDECTOMY;  Surgeon: Darnell Level, MD;  Location: [facility];  Service: General;  Laterality: N/A;   TOTAL HIP ARTHROPLASTY Left 10/21/2016   Procedure: LEFT TOTAL HIP ARTHROPLASTY ANTERIOR APPROACH;  Surgeon: [name], MD;  Location: [facility];  Service: Orthopedics;  Laterality: Left;   WRIST SURGERY Bilateral    carpal tunnel    MEDICATIONS:  acetaminophen (TYLENOL) 650 MG CR tablet    amLODipine (NORVASC) 5 MG tablet   aspirin 81 MG tablet   butalbital-acetaminophen-caffeine (FIORICET) 50-325-40 MG tablet   calcitRIOL (ROCALTROL) 0.5 MCG capsule   calcium carbonate (TUMS) 500 MG chewable tablet   cholestyramine (QUESTRAN) 4 g packet   cloNIDine (CATAPRES) 0.1 MG tablet   clotrimazole-betamethasone (LOTRISONE) cream   cyclobenzaprine (FLEXERIL) 10 MG tablet   dicyclomine (BENTYL) 20 MG tablet   famotidine (PEPCID) 20 MG tablet   gabapentin (NEURONTIN) 300 MG capsule   HYDROcodone-acetaminophen (NORCO/VICODIN) 5-325 MG tablet   levothyroxine (SYNTHROID) 100 MCG tablet   meclizine (ANTIVERT) 25 MG tablet   meloxicam (MOBIC) 7.5 MG tablet   metoprolol succinate (TOPROL-XL) 100 MG 24 hr tablet   ondansetron (ZOFRAN) 4 MG tablet   pantoprazole (PROTONIX) 40 MG tablet   PARoxetine (PAXIL) 10 MG tablet   potassium chloride SA (KLOR-CON M) 20 MEQ tablet   Propylene Glycol (SYSTANE COMPLETE) 0.6 % SOLN   RABEprazole (ACIPHEX) 20 MG tablet   sucralfate (CARAFATE) 1 GM/10ML suspension   topiramate (TOPAMAX) 100 MG tablet   No current facility-administered medications for this encounter.    Shonna Chock, PA-C Surgical Short Stay/Anesthesiology [facility] Phone [phone] [facility] Phone [phone] 11/09/2023 4:44 PM

## 2023-11-09 NOTE — Anesthesia Preprocedure Evaluation (Addendum)
Anesthesia Evaluation  Patient identified by MRN, date of birth, ID band Patient awake    Reviewed: Allergy & Precautions, NPO status , Patient's Chart, lab work & pertinent test results  History of Anesthesia Complications (+) PONV and history of anesthetic complications  Airway Mallampati: III  TM Distance: >3 FB Neck ROM: Full    Dental  (+) Edentulous Upper, Edentulous Lower   Pulmonary sleep apnea (denies cpap ever given to her, says sleep study was a long time ago)    Pulmonary exam normal breath sounds clear to auscultation       Cardiovascular hypertension (164/85 preop, per pt normally 160s SBP), Pt. on medications Normal cardiovascular exam Rhythm:Regular Rate:Normal     Neuro/Psych  Headaches PSYCHIATRIC DISORDERS Anxiety     CVA    GI/Hepatic Neg liver ROS, hiatal hernia,GERD  Controlled,,  Endo/Other  diabetesHypothyroidism  BMI 32  Renal/GU Renal InsufficiencyRenal diseaseCr 1.32  negative genitourinary   Musculoskeletal  (+) Arthritis , Osteoarthritis,    Abdominal   Peds  Hematology negative hematology ROS (+) Hb 14.2, plt 275   Anesthesia Other Findings   Reproductive/Obstetrics negative OB ROS                              Anesthesia Physical Anesthesia Plan  ASA: 3  Anesthesia Plan: Spinal and MAC   Post-op Pain Management: Tylenol PO (pre-op)*   Induction:   PONV Risk Score and Plan: 2 and Propofol infusion and TIVA  Airway Management Planned: Natural Airway and Nasal Cannula  Additional Equipment: None  Intra-op Plan:   Post-operative Plan:   Informed Consent: I have reviewed the patients History and Physical, chart, labs and discussed the procedure including the risks, benefits and alternatives for the proposed anesthesia with the patient or authorized representative who has indicated his/her understanding and acceptance.       Plan Discussed with:  CRNA  Anesthesia Plan Comments: (  )        Anesthesia Quick Evaluation

## 2023-11-09 NOTE — Progress Notes (Signed)
PCP - Ivery Quale at [facility] Cardiologist - denies  PPM/ICD - denies Device Orders - n/a Rep Notified - n/a  Chest x-ray - denies EKG - 10/22/23 Stress Test - 12/2015 ECHO - 05/28/19 Cardiac Cath - 2016  Sleep Study - OSA+ but does not wear CPAP - pt states since weight loss, no longer snores   Fasting Blood Sugar - pt states that very rarely she will check blood sugar at home; patient denies diabetes    Last dose of GLP1 agonist-  n/a GLP1 instructions: n/a  Blood Thinner Instructions: n/a Aspirin Instructions: pt states that her Aspirin has been on hold  ERAS Protcol - clears until 0545 G2- to complete by 0545  COVID TEST- n/a   Anesthesia review: yes  Patient denies shortness of breath, fever, cough and chest pain at PAT appointment   All instructions explained to the patient, with a verbal understanding of the material. Patient agrees to go over the instructions while at home for a better understanding. Patient also instructed to self quarantine after being tested for COVID-19. The opportunity to ask questions was provided.   Patient states she was recently taken to the ED for vertigo.  She had a dizzy spell at home and her husband called the ambulance.  She said it was related to her vertigo and has since been taking her meclizine and feels much better.

## 2023-11-13 ENCOUNTER — Telehealth: Payer: Self-pay | Admitting: *Deleted

## 2023-11-13 NOTE — Telephone Encounter (Signed)
[facility] RNCM call to patient to discuss her upcoming Right total hip arthroplasty with Dr. Magnus Ivan at [facility] on 11/10/23. She is agreeable to case management. Her plan is to return home with assistance from her husband. She had a previous L-THA in 2017 that went well and had a RW, but states it is broken. Would like another one as well as 3in1/BSC. Will order at hospital prior to discharge. Anticipate HHPT will be needed after a short hospital stay. Referral made to [facility] after choice provided. Reviewed all post op care instructions. will continue to follow for needs.

## 2023-11-13 NOTE — H&P (Signed)
TOTAL HIP ADMISSION H&P  Patient is admitted for right total hip arthroplasty.  Subjective:  Chief Complaint: right hip pain  HPI: Natalie Bond, 67 y.o. female, has a history of pain and functional disability in the right hip(s) due to arthritis and patient has failed non-surgical conservative treatments for greater than 12 weeks to include NSAID's and/or analgesics, corticosteriod injections, flexibility and strengthening excercises, supervised PT with diminished ADL's post treatment, use of assistive devices, weight reduction as appropriate, and activity modification.  Onset of symptoms was gradual starting 2 years ago with gradually worsening course since that time.The patient noted no past surgery on the right hip(s).  Patient currently rates pain in the right hip at 10 out of 10 with activity. Patient has night pain, worsening of pain with activity and weight bearing, trendelenberg gait, pain that interfers with activities of daily living, and pain with passive range of motion. Patient has evidence of subchondral cysts, subchondral sclerosis, periarticular osteophytes, and joint space narrowing by imaging studies. This condition presents safety issues increasing the risk of falls.  There is no current active infection.  Patient Active Problem List   Diagnosis Date Noted   Family history of colon cancer 11/02/2023   History of cholecystectomy 11/02/2023   Diarrhea 11/02/2023   Nausea 11/02/2023   Dysphagia 11/02/2023   Gastroesophageal reflux disease 11/02/2023   Unilateral primary osteoarthritis, right hip 10/16/2023   Toxic multinodular goiter 02/02/2023   Goiter, toxic, multinodular 01/28/2023   hyperthyroidism 03/17/2022   Cholangiectasis 03/17/2022   Esophageal dysphagia 03/17/2022   Fecal urgency 03/17/2022   Hyperglycemia 03/17/2022   Hypomagnesemia 03/17/2022   Neck pain 03/17/2022   Salivary calculus 03/17/2022   Spasm 03/17/2022   Hyperthyroidism 03/17/2022   Primary  osteoarthritis of first carpometacarpal joint of left hand 12/24/2019   Vomiting 08/28/2019   Diabetic peripheral neuropathy associated with type 2 diabetes mellitus (HCC) 06/06/2019   Hemiplegia (HCC) 06/06/2019   TIA (transient ischemic attack) 05/29/2019   Anterolisthesis 05/29/2019   Thyroid nodule 05/29/2019   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   Right arm weakness 05/27/2019   Type 2 diabetes mellitus without complication (HCC) 05/27/2019   Arthritis of carpometacarpal (CMC) joint of right thumb 04/16/2019   Generalized pruritus 02/27/2019   Rash and other nonspecific skin eruption 02/27/2019   Dysuria 01/03/2019   Localized edema 10/22/2018   Shoulder joint pain 08/28/2018   Mixed conductive and sensorineural hearing loss of left ear with restricted hearing of right ear 04/26/2018   Vertigo 04/26/2018   Acute serous otitis media of left ear 04/26/2018   Referred otalgia of left ear 04/26/2018   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   Venous stasis dermatitis of left lower extremity 04/09/2018   Achilles tendon contracture, left 04/09/2018   Pain in left ankle and joints of left foot 04/09/2018   Cough 04/09/2018   Otitis media 04/09/2018   Primary osteoarthritis of first carpometacarpal joint of right hand 10/09/2017   Bilateral hand pain 10/05/2017   Primary osteoarthritis of both first carpometacarpal joints 10/05/2017   Trochanteric bursitis, right hip 06/29/2017   Degenerative disc disease at L5-S1 level 05/04/2017   History of joint replacement 05/04/2017   Claw toe, acquired, right 04/27/2017   Achilles tendon contracture, right 04/27/2017   Pain in right foot 04/11/2017   Status post left hip replacement 10/21/2016   Chronic back pain 01/14/2016   Migraine 01/14/2016   Hematuria 03/27/2015   Shortness of breath 03/27/2015  Essential hypertension 01/22/2015   Obstructive sleep apnea (adult) (pediatric) 10/30/2014    Psychophysiologic insomnia 04/04/2014   Gastro-esophageal reflux disease without esophagitis 09/12/2013   Obesity 12/02/2011   Disequilibrium 11/10/2009   Enterocolitis due to Clostridium difficile 11/10/2009   Family history of diabetes mellitus 11/10/2009   Hyperlipidemia 11/10/2009   Meniere disease 11/10/2009   Headache 11/10/2009   Perforation of tympanic membrane 11/10/2009   Soft tissue lesion of shoulder region 11/10/2009   Past Medical History:  Diagnosis Date   Anxiety    Arthritis    Arthritis    back, wrists   Cancer (HCC)    skin cancer   Carpal tunnel syndrome    Complication of anesthesia    DDD (degenerative disc disease)    Diabetes mellitus without complication (HCC)    does not take meds   Dysrhythmia 1995   irreg hr   Full dentures    GERD (gastroesophageal reflux disease)    Headache    High cholesterol    History of bronchitis    History of hiatal hernia    History of kidney stones    2015   Hypertension    Hyperthyroidism    PONV (postoperative nausea and vomiting)    Sleep apnea    pt. does not use a CPAP or BiPAP   Stroke (HCC)    Pt. states possible mini stroke in 2014, no deficits   Vertigo     Past Surgical History:  Procedure Laterality Date   ABDOMINAL HYSTERECTOMY     BACK SURGERY  11/28/2002   [id number]   BACK SURGERY     CARDIAC CATHETERIZATION     01/27/15 [facility]): EF 60%, normal coronaries.   CARPAL TUNNEL RELEASE  09/20/2012   Procedure: CARPAL TUNNEL RELEASE;  Surgeon: Wyn Forster., MD;  Location: [facility];  Service: Orthopedics;  Laterality: Left;   CARPOMETACARPEL SUSPENSION PLASTY Right 12/12/2018   Procedure: RIGHT THUMB CARPOMETACARPAL (CMC) ARTHROPLASTY;  Surgeon: Tarry Kos, MD;  Location: [facility];  Service: Orthopedics;  Laterality: Right;   CARPOMETACARPEL SUSPENSION PLASTY Left 12/30/2019   Procedure: LEFT THUMB CARPOMETACARPAL (CMC) ARTHROPLASTY;  Surgeon: Tarry Kos, MD;  Location: [facility];  Service: Orthopedics;  Laterality: Left;   CATARACT EXTRACTION, BILATERAL     CHOLECYSTECTOMY  07/2016   done [place]   DORSAL COMPARTMENT RELEASE  09/20/2012   Procedure: RELEASE DORSAL COMPARTMENT (DEQUERVAIN);  Surgeon: Wyn Forster., MD;  Location: [facility];  Service: Orthopedics;  Laterality: Left;   EYE SURGERY     bilat cataracts   EYE SURGERY Right    torn retina repaired   HERNIA REPAIR Left 1990   left groin    LOWER EXTREMITY ANGIOGRAM Left 06/08/2015   Procedure: Ascending Venogram  Iliac Vein ;  Surgeon: Sherren Kerns, MD;  Location[facility];  Service: Vascular;  Laterality: Left;  ;  [facility]- [facility] / ZACH-[facility] HAVE BEEN NOTIFIED/ CONFIRMED   PARTIAL HYSTERECTOMY     SHOULDER SURGERY Left    THYROIDECTOMY N/A 02/02/2023   Procedure: TOTAL THYROIDECTOMY;  Surgeon: Darnell Level, MD;  Location: [facility];  Service: General;  Laterality: N/A;   TOTAL HIP ARTHROPLASTY Left 10/21/2016   Procedure: LEFT TOTAL HIP ARTHROPLASTY ANTERIOR APPROACH;  Surgeon: [name], MD;  Location: [facility];  Service: Orthopedics;  Laterality: Left;   WRIST SURGERY Bilateral    carpal tunnel    No  current facility-administered medications for this encounter.   Current Outpatient Medications  Medication Sig Dispense Refill Last Dose/Taking   acetaminophen (TYLENOL) 650 MG CR tablet Take 1,300 mg by mouth every 8 (eight) hours as needed for pain.   Taking As Needed   amLODipine (NORVASC) 5 MG tablet Take 1 tablet by mouth once daily (Patient taking differently: Take 5 mg by mouth daily as needed (High blood pressure).) 30 tablet 0 Taking Differently   aspirin 81 MG tablet Take 81 mg by mouth daily.   Taking   butalbital-acetaminophen-caffeine (FIORICET) 50-325-40 MG tablet Take 1 tablet by mouth every 12 (twelve) hours as needed for headache. 14 tablet 0 Taking As Needed   calcitRIOL (ROCALTROL) 0.5 MCG  capsule Take 1 capsule (0.5 mcg total) by mouth 2 (two) times daily. (Patient taking differently: Take 0.5 mcg by mouth 2 (two) times daily as needed (leg cramps).) 20 capsule 0 Taking Differently   calcium carbonate (TUMS) 500 MG chewable tablet Chew 2 tablets (400 mg of elemental calcium total) by mouth 3 (three) times daily. 90 tablet 1 Taking   cholestyramine (QUESTRAN) 4 g packet Take 1 packet (4 g total) by mouth 3 (three) times daily with meals. 60 each 12 Taking   cloNIDine (CATAPRES) 0.1 MG tablet Take 0.1 mg by mouth 3 (three) times daily as needed (If BP gets over 160). With Metoprolol   Taking As Needed   clotrimazole-betamethasone (LOTRISONE) cream Apply 1 Application topically 2 (two) times daily. (Patient taking differently: Apply 1 Application topically daily as needed (Skin flare -up).) 30 g 0 Taking Differently   cyclobenzaprine (FLEXERIL) 10 MG tablet Take 10 mg by mouth at bedtime.   Taking   dicyclomine (BENTYL) 20 MG tablet Take 20 mg by mouth 3 (three) times daily as needed for spasms.   Taking As Needed   famotidine (PEPCID) 20 MG tablet Take 20 mg by mouth 2 (two) times daily with a meal.   Taking   gabapentin (NEURONTIN) 300 MG capsule Take 300 mg by mouth at bedtime as needed (pain).   Taking As Needed   HYDROcodone-acetaminophen (NORCO/VICODIN) 5-325 MG tablet Take 1 tablet by mouth every 6 (six) hours as needed for moderate pain (pain score 4-6) or severe pain (pain score 7-10).   Taking As Needed   levothyroxine (SYNTHROID) 100 MCG tablet Take 100 mcg by mouth daily before breakfast.   Taking   meclizine (ANTIVERT) 25 MG tablet Take 1 tablet (25 mg total) by mouth 2 (two) times daily as needed for dizziness. 20 tablet 0 Taking As Needed   meloxicam (MOBIC) 7.5 MG tablet Take 7.5 mg by mouth every other day.   Taking   metoprolol succinate (TOPROL-XL) 100 MG 24 hr tablet Take 100 mg by mouth in the morning and at bedtime. Take with or immediately following a meal.   Taking    ondansetron (ZOFRAN) 4 MG tablet Take 1-2 tablets (4-8 mg total) by mouth every 8 (eight) hours as needed for nausea or vomiting. 20 tablet 0 Taking As Needed   pantoprazole (PROTONIX) 40 MG tablet Take 40 mg by mouth 2 (two) times daily before a meal.   Taking   PARoxetine (PAXIL) 10 MG tablet Take 10 mg by mouth daily as needed (Hot flashes).   Taking As Needed   potassium chloride SA (KLOR-CON M) 20 MEQ tablet Take 20 mEq by mouth every other day.   Taking   Propylene Glycol (SYSTANE COMPLETE) 0.6 % SOLN Place 1 drop into  both eyes as needed (dry eyes).   Taking As Needed   RABEprazole (ACIPHEX) 20 MG tablet Take 1 tablet (20 mg total) by mouth 2 (two) times daily. 60 tablet 2 Taking   sucralfate (CARAFATE) 1 GM/10ML suspension 1 gram BID for 2 weeks 420 mL 1 Taking   topiramate (TOPAMAX) 100 MG tablet Take 100 mg by mouth at bedtime.   Taking   Allergies  Allergen Reactions   Dilaudid [Hydromorphone Hcl] Anaphylaxis   Dilaudid [Hydromorphone Hcl] Anaphylaxis    [name]    Penicillins Anaphylaxis, Hives and Rash    Has patient had a PCN reaction causing immediate rash, facial/tongue/throat swelling, SOB or lightheadedness with hypotension: Yes Has patient had a PCN reaction causing severe rash involving mucus membranes or skin necrosis: No Has patient had a PCN reaction that required hospitalization Yes Has patient had a PCN reaction occurring within the last 10 years: No If all of the above answers are "NO", then may proceed with Cephalosporin use.    Atorvastatin     myalgias   Carvedilol     groggy   Mobic [Meloxicam] Other (See Comments)    Heartburn and elevated bp   Valsartan     Headache and caused kidney issues     Social History   Tobacco Use   Smoking status: Never   Smokeless tobacco: Never  Substance Use Topics   Alcohol use: Not Currently    Comment: very rarely    Family History  Problem Relation Age of Onset   Cancer Mother     Diabetes Mother    Heart disease Mother    Hyperlipidemia Mother    Hypertension Mother    Cancer Father    Diabetes Father    Heart disease Father    Hyperlipidemia Father    Hypertension Father    Varicose Veins Father    Colon cancer Sister    Hypertension Sister    Cancer Sister        colon   Heart disease Sister    Hyperlipidemia Sister    Hypertension Brother    Cancer Brother    Diabetes Brother    Hyperlipidemia Brother    Cancer Brother        pancreatic   Esophageal cancer Neg Hx    Inflammatory bowel disease Neg Hx    Liver disease Neg Hx    Prostate cancer Neg Hx    Stomach cancer Neg Hx      Review of Systems  Objective:  Physical Exam Vitals reviewed.  Constitutional:      Appearance: Normal appearance.  HENT:     Head: Normocephalic and atraumatic.  Eyes:     Extraocular Movements: Extraocular movements intact.     Pupils: Pupils are equal, round, and reactive to light.  Cardiovascular:     Rate and Rhythm: Normal rate and regular rhythm.     Pulses: Normal pulses.  Pulmonary:     Effort: Pulmonary effort is normal.     Breath sounds: Normal breath sounds.  Abdominal:     Palpations: Abdomen is soft.  Musculoskeletal:     Cervical back: Normal range of motion and neck supple.     Right hip: Tenderness and bony tenderness present. Decreased range of motion. Decreased strength.  Neurological:     Mental Status: She is alert and oriented to person, place, and time.  Psychiatric:        Behavior: Behavior normal.  Vital signs in last 24 hours:    Labs:   Estimated body mass index is 31.67 kg/m as calculated from the following:   Height as of 11/09/23: 5\' 6"  (1.676 m).   Weight as of 11/09/23: 89 kg.   Imaging Review Plain radiographs demonstrate severe degenerative joint disease of the right hip(s). The bone quality appears to be good for age and reported activity level.      Assessment/Plan:  End stage arthritis, right  hip(s)  The patient history, physical examination, clinical judgement of the provider and imaging studies are consistent with end stage degenerative joint disease of the right hip(s) and total hip arthroplasty is deemed medically necessary. The treatment options including medical management, injection therapy, arthroscopy and arthroplasty were discussed at length. The risks and benefits of total hip arthroplasty were presented and reviewed. The risks due to aseptic loosening, infection, stiffness, dislocation/subluxation,  thromboembolic complications and other imponderables were discussed.  The patient acknowledged the explanation, agreed to proceed with the plan and consent was signed. Patient is being admitted for inpatient treatment for surgery, pain control, PT, OT, prophylactic antibiotics, VTE prophylaxis, progressive ambulation and ADL's and discharge planning.The patient is planning to be discharged home with home health services

## 2023-11-14 ENCOUNTER — Encounter (HOSPITAL_COMMUNITY): Payer: Self-pay | Admitting: Orthopaedic Surgery

## 2023-11-14 ENCOUNTER — Other Ambulatory Visit: Payer: Self-pay

## 2023-11-14 ENCOUNTER — Observation Stay (HOSPITAL_COMMUNITY): Payer: PPO

## 2023-11-14 ENCOUNTER — Ambulatory Visit (HOSPITAL_COMMUNITY): Payer: PPO | Admitting: Vascular Surgery

## 2023-11-14 ENCOUNTER — Observation Stay (HOSPITAL_COMMUNITY)
Admission: RE | Admit: 2023-11-14 | Discharge: 2023-11-16 | Disposition: A | Discharge status: Home / Self Care | Payer: PPO | Attending: Orthopaedic Surgery | Admitting: Orthopaedic Surgery

## 2023-11-14 ENCOUNTER — Ambulatory Visit (HOSPITAL_COMMUNITY): Payer: PPO | Admitting: Certified Registered"

## 2023-11-14 ENCOUNTER — Encounter (HOSPITAL_COMMUNITY)
Admission: RE | Disposition: A | Discharge status: Home / Self Care | Payer: Self-pay | Source: Home / Self Care | Attending: Orthopaedic Surgery

## 2023-11-14 DIAGNOSIS — Z8673 Personal history of transient ischemic attack (TIA), and cerebral infarction without residual deficits: Secondary | ICD-10-CM | POA: Diagnosis not present

## 2023-11-14 DIAGNOSIS — Z85828 Personal history of other malignant neoplasm of skin: Secondary | ICD-10-CM | POA: Insufficient documentation

## 2023-11-14 DIAGNOSIS — Z96641 Presence of right artificial hip joint: Secondary | ICD-10-CM | POA: Diagnosis not present

## 2023-11-14 DIAGNOSIS — E119 Type 2 diabetes mellitus without complications: Secondary | ICD-10-CM | POA: Diagnosis not present

## 2023-11-14 DIAGNOSIS — Z7982 Long term (current) use of aspirin: Secondary | ICD-10-CM | POA: Diagnosis not present

## 2023-11-14 DIAGNOSIS — I10 Essential (primary) hypertension: Secondary | ICD-10-CM | POA: Diagnosis not present

## 2023-11-14 DIAGNOSIS — Z79899 Other long term (current) drug therapy: Secondary | ICD-10-CM | POA: Diagnosis not present

## 2023-11-14 DIAGNOSIS — Z96642 Presence of left artificial hip joint: Secondary | ICD-10-CM | POA: Diagnosis not present

## 2023-11-14 DIAGNOSIS — M169 Osteoarthritis of hip, unspecified: Secondary | ICD-10-CM | POA: Diagnosis present

## 2023-11-14 DIAGNOSIS — Z01818 Encounter for other preprocedural examination: Secondary | ICD-10-CM

## 2023-11-14 DIAGNOSIS — Z471 Aftercare following joint replacement surgery: Secondary | ICD-10-CM | POA: Diagnosis not present

## 2023-11-14 DIAGNOSIS — M1611 Unilateral primary osteoarthritis, right hip: Secondary | ICD-10-CM

## 2023-11-14 DIAGNOSIS — E039 Hypothyroidism, unspecified: Secondary | ICD-10-CM | POA: Diagnosis not present

## 2023-11-14 HISTORY — PX: TOTAL HIP ARTHROPLASTY: SHX124

## 2023-11-14 LAB — GLUCOSE, CAPILLARY
Glucose-Capillary: 113 mg/dL — ABNORMAL HIGH (ref 70–99)
Glucose-Capillary: 117 mg/dL — ABNORMAL HIGH (ref 70–99)

## 2023-11-14 LAB — ABO/RH: ABO/RH(D): O POS

## 2023-11-14 SURGERY — ARTHROPLASTY, HIP, TOTAL, ANTERIOR APPROACH
Anesthesia: Monitor Anesthesia Care | Site: Hip | Laterality: Right

## 2023-11-14 MED ORDER — ACETAMINOPHEN 500 MG PO TABS: 1000.0000 mg | ORAL_TABLET | Freq: Once | ORAL | Status: AC

## 2023-11-14 MED ORDER — PANTOPRAZOLE SODIUM 40 MG PO TBEC
40.0000 mg | DELAYED_RELEASE_TABLET | Freq: Two times a day (BID) | ORAL | Status: DC
  Administered 2023-11-14 – 2023-11-16 (×4): 40 mg via ORAL
  Filled 2023-11-14 (×4): qty 1

## 2023-11-14 MED ORDER — FENTANYL CITRATE (PF) 250 MCG/5ML IJ SOLN
INTRAMUSCULAR | Status: AC
  Filled 2023-11-14: qty 5

## 2023-11-14 MED ORDER — ONDANSETRON HCL 4 MG/2ML IJ SOLN
4.0000 mg | Freq: Four times a day (QID) | INTRAMUSCULAR | Status: DC | PRN
Start: 2023-11-14 — End: 2023-11-16

## 2023-11-14 MED ORDER — METOPROLOL SUCCINATE ER 50 MG PO TB24
100.0000 mg | ORAL_TABLET | Freq: Two times a day (BID) | ORAL | Status: DC
  Administered 2023-11-14 – 2023-11-16 (×4): 100 mg via ORAL
  Filled 2023-11-14 (×4): qty 2

## 2023-11-14 MED ORDER — METOCLOPRAMIDE HCL 5 MG/ML IJ SOLN
5.0000 mg | Freq: Three times a day (TID) | INTRAMUSCULAR | Status: DC | PRN
Start: 2023-11-14 — End: 2023-11-16

## 2023-11-14 MED ORDER — ONDANSETRON HCL 4 MG/2ML IJ SOLN
INTRAMUSCULAR | Status: DC | PRN
  Administered 2023-11-14: 4 mg via INTRAVENOUS

## 2023-11-14 MED ORDER — EPHEDRINE SULFATE (PRESSORS) 50 MG/ML IJ SOLN
INTRAMUSCULAR | Status: DC | PRN
  Administered 2023-11-14: 10 mg via INTRAVENOUS

## 2023-11-14 MED ORDER — POTASSIUM CHLORIDE CRYS ER 20 MEQ PO TBCR
20.0000 meq | EXTENDED_RELEASE_TABLET | ORAL | Status: DC
  Administered 2023-11-15: 20 meq via ORAL
  Filled 2023-11-14 (×2): qty 1

## 2023-11-14 MED ORDER — AMISULPRIDE (ANTIEMETIC) 5 MG/2ML IV SOLN: 10.0000 mg | Freq: Once | INTRAVENOUS | Status: DC | PRN

## 2023-11-14 MED ORDER — MECLIZINE HCL 25 MG PO TABS: 25.0000 mg | ORAL_TABLET | Freq: Two times a day (BID) | ORAL | Status: DC | PRN

## 2023-11-14 MED ORDER — TOPIRAMATE 25 MG PO TABS
100.0000 mg | ORAL_TABLET | Freq: Every day | ORAL | Status: DC
  Administered 2023-11-14 – 2023-11-15 (×2): 100 mg via ORAL
  Filled 2023-11-14 (×2): qty 4

## 2023-11-14 MED ORDER — 0.9 % SODIUM CHLORIDE (POUR BTL) OPTIME
TOPICAL | Status: DC | PRN
  Administered 2023-11-14: 1000 mL

## 2023-11-14 MED ORDER — DIPHENHYDRAMINE HCL 12.5 MG/5ML PO ELIX: 12.5000 mg | ORAL_SOLUTION | ORAL | Status: DC | PRN

## 2023-11-14 MED ORDER — DEXAMETHASONE SODIUM PHOSPHATE 10 MG/ML IJ SOLN
INTRAMUSCULAR | Status: AC
  Filled 2023-11-14: qty 1

## 2023-11-14 MED ORDER — FENTANYL CITRATE (PF) 100 MCG/2ML IJ SOLN
INTRAMUSCULAR | Status: AC
  Filled 2023-11-14: qty 2

## 2023-11-14 MED ORDER — METOCLOPRAMIDE HCL 5 MG PO TABS
5.0000 mg | ORAL_TABLET | Freq: Three times a day (TID) | ORAL | Status: DC | PRN
Start: 2023-11-14 — End: 2023-11-16

## 2023-11-14 MED ORDER — EPHEDRINE 5 MG/ML INJ
INTRAVENOUS | Status: AC
  Filled 2023-11-14: qty 5

## 2023-11-14 MED ORDER — ALUM & MAG HYDROXIDE-SIMETH 200-200-20 MG/5ML PO SUSP: 30.0000 mL | ORAL | Status: DC | PRN

## 2023-11-14 MED ORDER — OXYCODONE HCL 5 MG PO TABS
10.0000 mg | ORAL_TABLET | ORAL | Status: DC | PRN
  Administered 2023-11-14: 10 mg via ORAL
  Administered 2023-11-14 – 2023-11-16 (×10): 15 mg via ORAL
  Filled 2023-11-14 (×11): qty 3

## 2023-11-14 MED ORDER — ASPIRIN 81 MG PO CHEW
81.0000 mg | CHEWABLE_TABLET | Freq: Two times a day (BID) | ORAL | Status: DC
  Administered 2023-11-14 – 2023-11-16 (×4): 81 mg via ORAL
  Filled 2023-11-14 (×4): qty 1

## 2023-11-14 MED ORDER — SODIUM CHLORIDE 0.9% FLUSH
10.0000 mL | Freq: Two times a day (BID) | INTRAVENOUS | Status: DC
  Administered 2023-11-14 – 2023-11-16 (×5): 10 mL via INTRAVENOUS

## 2023-11-14 MED ORDER — GABAPENTIN 300 MG PO CAPS
300.0000 mg | ORAL_CAPSULE | Freq: Every evening | ORAL | Status: DC | PRN
Start: 2023-11-14 — End: 2023-11-16

## 2023-11-14 MED ORDER — POVIDONE-IODINE 10 % EX SWAB
2.0000 | Freq: Once | CUTANEOUS | Status: AC
  Administered 2023-11-14: 2 via TOPICAL

## 2023-11-14 MED ORDER — MENTHOL 3 MG MT LOZG: 1.0000 | LOZENGE | OROMUCOSAL | Status: DC | PRN

## 2023-11-14 MED ORDER — TRANEXAMIC ACID-NACL 1000-0.7 MG/100ML-% IV SOLN
INTRAVENOUS | Status: AC
  Filled 2023-11-14: qty 100

## 2023-11-14 MED ORDER — CEFAZOLIN SODIUM-DEXTROSE 2-4 GM/100ML-% IV SOLN: 2.0000 g | INTRAVENOUS | Status: DC

## 2023-11-14 MED ORDER — VANCOMYCIN HCL IN DEXTROSE 1-5 GM/200ML-% IV SOLN
1000.0000 mg | Freq: Once | INTRAVENOUS | Status: AC
Start: 2023-11-14 — End: 2023-11-14
  Administered 2023-11-14: 1000 mg via INTRAVENOUS

## 2023-11-14 MED ORDER — VANCOMYCIN HCL IN DEXTROSE 1-5 GM/200ML-% IV SOLN
1000.0000 mg | Freq: Two times a day (BID) | INTRAVENOUS | Status: AC
  Administered 2023-11-14: 1000 mg via INTRAVENOUS
  Filled 2023-11-14: qty 200

## 2023-11-14 MED ORDER — MORPHINE SULFATE (PF) 2 MG/ML IV SOLN
2.0000 mg | INTRAVENOUS | Status: DC | PRN
  Administered 2023-11-14 – 2023-11-15 (×4): 2 mg via INTRAVENOUS
  Filled 2023-11-14 (×4): qty 1

## 2023-11-14 MED ORDER — ONDANSETRON HCL 4 MG PO TABS: 4.0000 mg | ORAL_TABLET | Freq: Four times a day (QID) | ORAL | Status: DC | PRN

## 2023-11-14 MED ORDER — TRANEXAMIC ACID-NACL 1000-0.7 MG/100ML-% IV SOLN
1000.0000 mg | INTRAVENOUS | Status: AC
  Administered 2023-11-14: 1000 mg via INTRAVENOUS
  Filled 2023-11-14: qty 100

## 2023-11-14 MED ORDER — LACTATED RINGERS IV SOLN: INTRAVENOUS | Status: DC

## 2023-11-14 MED ORDER — CEFAZOLIN SODIUM-DEXTROSE 2-4 GM/100ML-% IV SOLN
INTRAVENOUS | Status: AC
  Filled 2023-11-14: qty 100

## 2023-11-14 MED ORDER — POLYVINYL ALCOHOL 1.4 % OP SOLN: 1.0000 [drp] | OPHTHALMIC | Status: DC | PRN

## 2023-11-14 MED ORDER — PROPOFOL 500 MG/50ML IV EMUL
INTRAVENOUS | Status: DC | PRN
  Administered 2023-11-14: 25 ug/kg/min via INTRAVENOUS

## 2023-11-14 MED ORDER — HYDROMORPHONE HCL 1 MG/ML IJ SOLN: 0.5000 mg | INTRAMUSCULAR | Status: DC | PRN

## 2023-11-14 MED ORDER — MIDAZOLAM HCL 5 MG/5ML IJ SOLN
INTRAMUSCULAR | Status: DC | PRN
  Administered 2023-11-14: 2 mg via INTRAVENOUS

## 2023-11-14 MED ORDER — OXYCODONE HCL 5 MG PO TABS: 5.0000 mg | ORAL_TABLET | ORAL | Status: DC | PRN

## 2023-11-14 MED ORDER — ACETAMINOPHEN 500 MG PO TABS
ORAL_TABLET | ORAL | Status: AC
  Administered 2023-11-14: 1000 mg via ORAL
  Filled 2023-11-14: qty 2

## 2023-11-14 MED ORDER — OXYCODONE HCL 5 MG/5ML PO SOLN
5.0000 mg | Freq: Once | ORAL | Status: DC | PRN
Start: 2023-11-14 — End: 2023-11-14

## 2023-11-14 MED ORDER — PHENYLEPHRINE 80 MCG/ML (10ML) SYRINGE FOR IV PUSH (FOR BLOOD PRESSURE SUPPORT)
PREFILLED_SYRINGE | INTRAVENOUS | Status: AC
  Filled 2023-11-14: qty 10

## 2023-11-14 MED ORDER — ONDANSETRON HCL 4 MG/2ML IJ SOLN: 4.0000 mg | Freq: Once | INTRAMUSCULAR | Status: DC | PRN

## 2023-11-14 MED ORDER — PROPOFOL 10 MG/ML IV BOLUS
INTRAVENOUS | Status: AC
  Filled 2023-11-14: qty 20

## 2023-11-14 MED ORDER — ACETAMINOPHEN 325 MG PO TABS
325.0000 mg | ORAL_TABLET | Freq: Four times a day (QID) | ORAL | Status: DC | PRN
Start: 2023-11-15 — End: 2023-11-16
  Administered 2023-11-15 – 2023-11-16 (×2): 650 mg via ORAL
  Filled 2023-11-14 (×2): qty 2

## 2023-11-14 MED ORDER — PAROXETINE HCL 10 MG PO TABS
10.0000 mg | ORAL_TABLET | Freq: Every day | ORAL | Status: DC | PRN
Start: 2023-11-14 — End: 2023-11-16

## 2023-11-14 MED ORDER — OXYCODONE HCL 5 MG PO TABS
ORAL_TABLET | ORAL | Status: AC
  Filled 2023-11-14: qty 2

## 2023-11-14 MED ORDER — CHLORHEXIDINE GLUCONATE 0.12 % MT SOLN
15.0000 mL | Freq: Once | OROMUCOSAL | Status: AC
Start: 2023-11-14 — End: 2023-11-14

## 2023-11-14 MED ORDER — GLYCOPYRROLATE PF 0.2 MG/ML IJ SOSY
PREFILLED_SYRINGE | INTRAMUSCULAR | Status: AC
  Filled 2023-11-14: qty 1

## 2023-11-14 MED ORDER — CHLORHEXIDINE GLUCONATE 0.12 % MT SOLN
OROMUCOSAL | Status: AC
  Administered 2023-11-14: 15 mL via OROMUCOSAL
  Filled 2023-11-14: qty 15

## 2023-11-14 MED ORDER — HYDRALAZINE HCL 20 MG/ML IJ SOLN
INTRAMUSCULAR | Status: AC
  Filled 2023-11-14: qty 1

## 2023-11-14 MED ORDER — DEXAMETHASONE SODIUM PHOSPHATE 10 MG/ML IJ SOLN
INTRAMUSCULAR | Status: DC | PRN
  Administered 2023-11-14: 5 mg via INTRAVENOUS

## 2023-11-14 MED ORDER — CHOLESTYRAMINE 4 G PO PACK
4.0000 g | PACK | Freq: Three times a day (TID) | ORAL | Status: DC
  Administered 2023-11-14 – 2023-11-15 (×3): 4 g via ORAL
  Filled 2023-11-14 (×7): qty 1

## 2023-11-14 MED ORDER — DOCUSATE SODIUM 100 MG PO CAPS
100.0000 mg | ORAL_CAPSULE | Freq: Two times a day (BID) | ORAL | Status: DC
  Administered 2023-11-14 – 2023-11-16 (×4): 100 mg via ORAL
  Filled 2023-11-14 (×5): qty 1

## 2023-11-14 MED ORDER — GLYCOPYRROLATE 0.2 MG/ML IJ SOLN
INTRAMUSCULAR | Status: DC | PRN
  Administered 2023-11-14: .2 mg via INTRAVENOUS

## 2023-11-14 MED ORDER — PROPOFOL 10 MG/ML IV BOLUS
INTRAVENOUS | Status: DC | PRN
  Administered 2023-11-14: 20 mg via INTRAVENOUS

## 2023-11-14 MED ORDER — VANCOMYCIN HCL IN DEXTROSE 1-5 GM/200ML-% IV SOLN
INTRAVENOUS | Status: AC
  Filled 2023-11-14: qty 200

## 2023-11-14 MED ORDER — ORAL CARE MOUTH RINSE: 15.0000 mL | Freq: Once | OROMUCOSAL | Status: AC

## 2023-11-14 MED ORDER — LIDOCAINE 2% (20 MG/ML) 5 ML SYRINGE
INTRAMUSCULAR | Status: AC
  Filled 2023-11-14: qty 5

## 2023-11-14 MED ORDER — PHENOL 1.4 % MT LIQD: 1.0000 | OROMUCOSAL | Status: DC | PRN

## 2023-11-14 MED ORDER — OXYCODONE HCL 5 MG PO TABS: 5.0000 mg | ORAL_TABLET | Freq: Once | ORAL | Status: DC | PRN

## 2023-11-14 MED ORDER — BUPIVACAINE IN DEXTROSE 0.75-8.25 % IT SOLN
INTRATHECAL | Status: DC | PRN
  Administered 2023-11-14: 1.8 mL via INTRATHECAL

## 2023-11-14 MED ORDER — HYDRALAZINE HCL 20 MG/ML IJ SOLN
5.0000 mg | INTRAMUSCULAR | Status: DC | PRN
  Administered 2023-11-14 (×3): 5 mg via INTRAVENOUS

## 2023-11-14 MED ORDER — MIDAZOLAM HCL 2 MG/2ML IJ SOLN
INTRAMUSCULAR | Status: AC
  Filled 2023-11-14: qty 2

## 2023-11-14 MED ORDER — ONDANSETRON HCL 4 MG/2ML IJ SOLN
INTRAMUSCULAR | Status: AC
  Filled 2023-11-14: qty 2

## 2023-11-14 MED ORDER — SODIUM CHLORIDE 0.9% FLUSH
10.0000 mL | Freq: Two times a day (BID) | INTRAVENOUS | Status: DC
Start: 2023-11-14 — End: 2023-11-16
  Administered 2023-11-14 – 2023-11-16 (×3): 10 mL via INTRAVENOUS

## 2023-11-14 MED ORDER — TIZANIDINE HCL 4 MG PO TABS: 4.0000 mg | ORAL_TABLET | Freq: Four times a day (QID) | ORAL | Status: DC | PRN

## 2023-11-14 MED ORDER — FAMOTIDINE 20 MG PO TABS
20.0000 mg | ORAL_TABLET | Freq: Two times a day (BID) | ORAL | Status: DC
  Administered 2023-11-14 – 2023-11-16 (×4): 20 mg via ORAL
  Filled 2023-11-14 (×4): qty 1

## 2023-11-14 MED ORDER — LEVOTHYROXINE SODIUM 100 MCG PO TABS
100.0000 ug | ORAL_TABLET | Freq: Every day | ORAL | Status: DC
  Administered 2023-11-15 – 2023-11-16 (×2): 100 ug via ORAL
  Filled 2023-11-14 (×2): qty 1

## 2023-11-14 MED ORDER — FENTANYL CITRATE (PF) 100 MCG/2ML IJ SOLN
25.0000 ug | INTRAMUSCULAR | Status: AC | PRN
  Administered 2023-11-14: 25 ug via INTRAVENOUS
  Administered 2023-11-14: 50 ug via INTRAVENOUS
  Administered 2023-11-14 (×2): 25 ug via INTRAVENOUS
  Administered 2023-11-14: 50 ug via INTRAVENOUS
  Administered 2023-11-14: 25 ug via INTRAVENOUS

## 2023-11-14 SURGICAL SUPPLY — 48 items
BAG COUNTER SPONGE SURGICOUNT (BAG) ×2 IMPLANT
BENZOIN TINCTURE PRP APPL 2/3 (GAUZE/BANDAGES/DRESSINGS) ×2 IMPLANT
BLADE CLIPPER SURG (BLADE) IMPLANT
BLADE SAW SGTL 18X1.27X75 (BLADE) ×2 IMPLANT
CLSR STERI-STRIP ANTIMIC 1/2X4 (GAUZE/BANDAGES/DRESSINGS) IMPLANT
COLLAR OFFSET CORAIL SZ 11 HIP (Stem) IMPLANT
CORAIL OFFSET COLLAR SZ 11 HIP (Stem) ×1 IMPLANT
COVER SURGICAL LIGHT HANDLE (MISCELLANEOUS) ×2 IMPLANT
DRAPE C-ARM 42X72 X-RAY (DRAPES) ×2 IMPLANT
DRAPE STERI IOBAN 125X83 (DRAPES) ×2 IMPLANT
DRAPE U-SHAPE 47X51 STRL (DRAPES) ×6 IMPLANT
DRSG AQUACEL AG ADV 3.5X10 (GAUZE/BANDAGES/DRESSINGS) ×2 IMPLANT
DURAPREP 26ML APPLICATOR (WOUND CARE) ×2 IMPLANT
ELECT BLADE 4.0 EZ CLEAN MEGAD (MISCELLANEOUS) ×1
ELECT BLADE 6.5 EXT (BLADE) IMPLANT
ELECT REM PT RETURN 9FT ADLT (ELECTROSURGICAL) ×1
ELECTRODE BLDE 4.0 EZ CLN MEGD (MISCELLANEOUS) ×2 IMPLANT
ELECTRODE REM PT RTRN 9FT ADLT (ELECTROSURGICAL) ×2 IMPLANT
FACESHIELD WRAPAROUND (MASK) ×2 IMPLANT
FACESHIELD WRAPAROUND OR TEAM (MASK) ×4 IMPLANT
GLOVE BIOGEL PI IND STRL 8 (GLOVE) ×4 IMPLANT
GLOVE ECLIPSE 8.0 STRL XLNG CF (GLOVE) ×2 IMPLANT
GLOVE ORTHO TXT STRL SZ7.5 (GLOVE) ×4 IMPLANT
GOWN STRL REUS W/ TWL LRG LVL3 (GOWN DISPOSABLE) ×4 IMPLANT
GOWN STRL REUS W/ TWL XL LVL3 (GOWN DISPOSABLE) ×4 IMPLANT
HEAD CERAMIC 36 PLUS5 (Hips) IMPLANT
KIT BASIN OR (CUSTOM PROCEDURE TRAY) ×2 IMPLANT
KIT TURNOVER KIT B (KITS) ×2 IMPLANT
LINER ACETAB NEUTRAL 36ID 520D (Liner) IMPLANT
MANIFOLD NEPTUNE II (INSTRUMENTS) ×2 IMPLANT
NS IRRIG 1000ML POUR BTL (IV SOLUTION) ×2 IMPLANT
PACK TOTAL JOINT (CUSTOM PROCEDURE TRAY) ×2 IMPLANT
PAD ARMBOARD 7.5X6 YLW CONV (MISCELLANEOUS) ×2 IMPLANT
PIN SECTOR W/GRIP ACE CUP 52MM (Hips) IMPLANT
SET HNDPC FAN SPRY TIP SCT (DISPOSABLE) ×2 IMPLANT
STAPLER VISISTAT 35W (STAPLE) IMPLANT
STRIP CLOSURE SKIN 1/2X4 (GAUZE/BANDAGES/DRESSINGS) ×4 IMPLANT
SUT ETHIBOND NAB CT1 #1 30IN (SUTURE) ×2 IMPLANT
SUT MNCRL AB 4-0 PS2 18 (SUTURE) IMPLANT
SUT VIC AB 0 CT1 27XBRD ANBCTR (SUTURE) ×2 IMPLANT
SUT VIC AB 1 CT1 27XBRD ANBCTR (SUTURE) ×2 IMPLANT
SUT VIC AB 2-0 CT1 TAPERPNT 27 (SUTURE) ×2 IMPLANT
TOWEL GREEN STERILE (TOWEL DISPOSABLE) ×2 IMPLANT
TOWEL GREEN STERILE FF (TOWEL DISPOSABLE) ×2 IMPLANT
TRAY CATH INTERMITTENT SS 16FR (CATHETERS) IMPLANT
TRAY FOLEY W/BAG SLVR 16FR ST (SET/KITS/TRAYS/PACK) IMPLANT
TUBE SUCT ARGYLE STRL (TUBING) IMPLANT
WATER STERILE IRR 1000ML POUR (IV SOLUTION) ×4 IMPLANT

## 2023-11-14 NOTE — Anesthesia Procedure Notes (Signed)
Spinal  Patient location during procedure: OR Start time: 11/14/2023 7:32 AM End time: 11/14/2023 7:36 AM Reason for block: surgical anesthesia Staffing Performed: anesthesiologist  Anesthesiologist: Lannie Fields, DO Performed by: Lannie Fields, DO Authorized by: Lannie Fields, DO   Preanesthetic Checklist Completed: patient identified, IV checked, risks and benefits discussed, surgical consent, monitors and equipment checked, pre-op evaluation and timeout performed Spinal Block Patient position: sitting Prep: DuraPrep and site prepped and draped Patient monitoring: cardiac monitor, continuous pulse ox and blood pressure Approach: midline Location: L3-4 Injection technique: single-shot Needle Needle type: Pencan  Needle gauge: 24 G Needle length: 9 cm Assessment Sensory level: T6 Events: CSF return Additional Notes Functioning IV was confirmed and monitors were applied. Sterile prep and drape, including hand hygiene and sterile gloves were used. The patient was positioned and the spine was prepped. The skin was anesthetized with lidocaine.  Free flow of clear CSF was obtained prior to injecting local anesthetic into the CSF.  The spinal needle aspirated freely following injection.  The needle was carefully withdrawn.  The patient tolerated the procedure well.

## 2023-11-14 NOTE — Anesthesia Postprocedure Evaluation (Signed)
Anesthesia Post Note  Patient: ALYSSON RESSLER  Procedure(s) Performed: RIGHT TOTAL HIP ARTHROPLASTY ANTERIOR APPROACH (Right: Hip)     Patient location during evaluation: PACU Anesthesia Type: MAC and Spinal Level of consciousness: awake and alert Pain management: pain level controlled Vital Signs Assessment: post-procedure vital signs reviewed and stable Respiratory status: spontaneous breathing, nonlabored ventilation and respiratory function stable Cardiovascular status: blood pressure returned to baseline and stable Postop Assessment: no apparent nausea or vomiting, no headache, no backache and spinal receding Anesthetic complications: no   No notable events documented.  Last Vitals:  Vitals:   11/14/23 0945 11/14/23 1000  BP: (!) 166/86 (!) 150/102  Pulse: (!) 52 (!) 46  Resp: 17 11  Temp:    SpO2: 98% 97%    Last Pain:  Vitals:   11/14/23 0945  TempSrc:   PainSc: 4     LLE Motor Response: Purposeful movement (11/14/23 1000) LLE Sensation: Increased (11/14/23 1000) RLE Motor Response: Purposeful movement (11/14/23 1000) RLE Sensation: Increased (11/14/23 1000) L Sensory Level: L5-Outer lower leg, top of foot, great toe (11/14/23 1000) R Sensory Level: S1-Sole of foot, small toes (11/14/23 1000)  Tennis Must Tamel Abel

## 2023-11-14 NOTE — Op Note (Signed)
Operative Note  Date of operation: 11/14/2023 Preoperative diagnosis: Right hip primary osteoarthritis Postoperative diagnosis: Same  Procedure: Right direct anterior total hip arthroplasty  Implants: Implant Name Type Inv. Item Serial No. Manufacturer Lot No. LRB No. Used Action  PIN SECTOR W/GRIP ACE CUP - [id number] Hips PIN SECTOR W/GRIP ACE CUP  [employer] [id number] Right 1 Implanted  LINER ACETAB NEUTRAL 36ID 520D - [id number] Liner LINER ACETAB NEUTRAL 36ID 520D  [employer] [id number] Right 1 Implanted  CORAIL OFFSET COLLAR SZ 11 HIP - [id number] Stem CORAIL OFFSET COLLAR SZ 11 HIP  [employer]PUY [employer]TH[employer] [id number] Right 1 Implanted  HEAD CERAMIC 36 PLUS5 - [id number] Hips HEAD CERAMIC 36 PLUS5  [employer]TH[employer] [id number] Right 1 Implanted   Surgeon: Vanita Panda. Magnus Ivan, MD Assistant: April Green, RNFA  Anesthesia: Spinal Antibiotics: IV vancomycin EBL: 150 cc Complications: None  Indications: The patient is a 67 year old female well-known to me.  She has developed worsening arthritis and pain of her right hip.  We replaced her left hip back in 2017 and has done very well.  Over the last year she has had daily right hip pain that is detrimentally affecting her mobility, her quality life and actives daily living.  Her x-rays do show significant arthritis of the right hip.  He has tried and failed conservative treatment.  At this point she wished to proceed with a total hip arthroplasty on the right side.  Having had this before she is fully aware of the risk of acute blood loss anemia, nerve vessel injury, fracture, infection, DVT, dislocation, implant failure, leg length differences and wound healing issues.  She understands that our goals are hopefully decrease pain, improve mobility, and improve quality of life.  Procedure description: After informed consent was obtained and the appropriate right hip was marked, the patient was brought to the operating room  and set up on the stretcher where spinal anesthesia was obtained.  She was then laid in supine position on the stretcher and a Foley catheter was placed.  Traction boots were placed on both her feet and she was next placed supine on the Hana fracture table with a perineal post in place in both legs and inline skeletal traction vices with no traction applied.  Her right operative hip and pelvis were prepped and draped with DuraPrep and sterile drapes.  The right hip and pelvis were assessed radiographically.  A timeout was called and she was then divided the correct treatment.  Right hip.  An incision was made just inferior and posterior to the ASIS and carried slightly obliquely down the right.  Dissection was carried down to the tensor fascia lata muscle and the tensor fascia was divided longitudinally to proceed with a direct interposed the hip.  Circumflex vessels were identified and cauterized.  The hip capsule was identified and opened up in L-type format finding a moderate joint effusion.  Cobra retractors were placed around the medial and lateral femoral neck and a femoral neck cut was made with an oscillating saw and this was completed with an osteotome.  A corkscrew guide was placed in the femoral head and the femoral head was removed its entirety.  There was a area with significant cartilage wear.  A bent Hohmann was then placed over the medial acetabular rim and remnants of the acetabular labrum and other debris removed.  Reaming was then initiated from a size 43 reamer and stepwise increments going up to a size 51 reamer with all reamers placed under  direct visualization and the last reamer also placed under direct fluoroscopy in order to obtain the depth reaming, the inclination and anteversion.  The real DePuy sector GRIPTION acetabular opponent size 52 was then placed under direct visualization and fluoroscopy followed by a 36+0 neutral polyethylene liner.  Attention was then turned to the femur.   With the right leg externally rotated to 120 degrees, extended and adducted, a Mueller retractor was placed medially and a Hohmann retractor behind the greater trochanter.  The lateral joint capsule was released and a box cutting osteotome was used to enter the femoral canal.  Broaching was then initiated using the Corail broaching system from DePuy from a size 8 going to a size 11.  With a size 11 in place we trialed a standard offset femoral neck and a 36+1.5 trial hip ball.  The right leg was brought over and up and with traction and internal rotation it was reduced in the pelvis.  It just felt unstable to me on clinical exam and I can dislocate easily.  We then removed the trial components and I decided to go with a size 11 femoral component for the real component with a high offset neck.  We placed that we will size 11 Corail without difficulty and a with a 36+5 ceramic head ball.  This reduced the pelvis and I was very pleased with range of motion as well as offset and stability assessed radiographically and mechanically.  I was also comfortable with her leg lengths.  The soft tissue was irrigated with normal saline solution.  The joint capsule was closed interrupted #1 Ethibond suture.  #1 Vicryl was used to close the tensor fascia.  0 Vicryl was used to close the deep tissue and 2-0 Vicryl was used to subcutaneous tissue.  The skin was closed with staples.  An Aquacel dressing was applied.  The patient was taken off the Hana table and taken recovery room in stable condition.

## 2023-11-14 NOTE — Interval H&P Note (Signed)
History and Physical Interval Note: The patient understands that she is here today for a right total hip replacement to treat her right hip osteoarthritis.  There has been no acute or interval change in her medical status. The risks and benefits of surgery have been discussed in detail and informed consent has been obtained.  The right operative hip has been marked.  11/14/2023 7:08 AM  Natalie Bond Natalie Bond  has presented today for surgery, with the diagnosis of osteoarthritis right hip.  The various methods of treatment have been discussed with the patient and family. After consideration of risks, benefits and other options for treatment, the patient has consented to  Procedure(s): RIGHT TOTAL HIP ARTHROPLASTY ANTERIOR APPROACH (Right) as a surgical intervention.  The patient's history has been reviewed, patient examined, no change in status, stable for surgery.  I have reviewed the patient's chart and labs.  Questions were answered to the patient's satisfaction.     [name]

## 2023-11-14 NOTE — Discharge Instructions (Signed)

## 2023-11-14 NOTE — Transfer of Care (Signed)
Immediate Anesthesia Transfer of Care Note  [name]  Procedure(s) Performed: RIGHT TOTAL HIP ARTHROPLASTY ANTERIOR APPROACH (Right: Hip)  Patient Location: PACU  Anesthesia Type:Spinal  Level of Consciousness: awake, alert , oriented, and patient cooperative  Airway & Oxygen Therapy: Patient Spontanous Breathing and Patient connected to nasal cannula oxygen  Post-op Assessment: Report given to RN and Post -op Vital signs reviewed and stable  Post vital signs: Reviewed and stable  Last Vitals:  Vitals Value Taken Time  BP 105/66 11/14/23 0915  Temp 36.6 C 11/14/23 0915  Pulse 51 11/14/23 0922  Resp 11 11/14/23 0922  SpO2 93 % 11/14/23 0922  Vitals shown include unfiled device data.  Last Pain:  Vitals:   11/14/23 0915  TempSrc:   PainSc: 0-No pain         Complications: No notable events documented.

## 2023-11-14 NOTE — Evaluation (Signed)
Physical Therapy Evaluation Patient Details Name: Natalie Bond MRN: [id number] DOB: 1956/08/15 Today's Date: 11/14/2023  History of Present Illness  67 y.o. female presents to [facility] hospital on 11/14/2023 for elective R THA. PMH includes HTN, migraine, bilateral achilles tendon contracture, CVA, DMII, hyperthyroidism, HLD, meniere disease.  Clinical Impression  Pt presents to PT with deficits in functional mobility, strength, power, gait, balance, endurance. PT evaluation is limited by pt experiencing symptoms of lightheadedness and nausea in sitting, along with pallor. Pt assisted back to supine where BP was 112/80, much lower than most recent BP documented in chart of 158/79. PT provides education on surgical hip exercises. PT anticipates the pt will progress well if pain is controlled sufficiently and if blood pressure becomes more stable when mobilizing. PT encourages the patient to raise [facility] when later in the evening for more upright positioning. PT will follow up tomorrow for progression to transfer and gait training.      If plan is discharge home, recommend the following: A little help with walking and/or transfers;A little help with bathing/dressing/bathroom;Assistance with cooking/housework;Assist for transportation;Help with stairs or ramp for entrance   Can travel by private vehicle        Equipment Recommendations Rolling walker (2 wheels);BSC/3in1  Recommendations for Other Services       Functional Status Assessment Patient has had a recent decline in their functional status and demonstrates the ability to make significant improvements in function in a reasonable and predictable amount of time.     Precautions / Restrictions Precautions Precautions: Fall Precaution Comments: direct anterior THA Restrictions Weight Bearing Restrictions Per Provider Order: Yes RLE Weight Bearing Per Provider Order: Weight bearing as tolerated      Mobility  Bed Mobility Overal bed  mobility: Needs Assistance Bed Mobility: Supine to Sit, Sit to Supine     Supine to sit: Mod assist, HOB elevated Sit to supine: Min assist        Transfers Overall transfer level:  (deferred 2/2 hypotension relative to baseline along with signs/symptoms of lightheadedness, nausea, pallor)                      Ambulation/Gait                  Stairs            Wheelchair Mobility     Tilt Bed    Modified Rankin (Stroke Patients Only)       Balance Overall balance assessment: Needs assistance Sitting-balance support: No upper extremity supported, Feet supported Sitting balance-Leahy Scale: Fair                                       Pertinent Vitals/Pain Pain Assessment Pain Assessment: 0-10 Pain Score: 10-Worst pain ever Pain Location: R lateral hip Pain Descriptors / Indicators: Aching Pain Intervention(s): Limited activity within patient's tolerance    Home Living Family/patient expects to be discharged to:: Private residence Living Arrangements: Spouse/significant other Available Help at Discharge: Family;Available 24 hours/day Type of Home: House Home Access: Stairs to enter Entrance Stairs-Rails: Right Entrance Stairs-Number of Steps: 4   Home Layout: One level Home Equipment: None      Prior Function Prior Level of Function : Independent/Modified Independent             Mobility Comments: ambulatory without DME       Extremity/Trunk Assessment  Upper Extremity Assessment Upper Extremity Assessment: Overall WFL for tasks assessed    Lower Extremity Assessment Lower Extremity Assessment: RLE deficits/detail RLE Deficits / Details: grossly 4-/5 based on observed mobility, no formall MMT performed, ROM WFL    Cervical / Trunk Assessment Cervical / Trunk Assessment: Normal  Communication   Communication Communication: No apparent difficulties Cueing Techniques: Verbal cues  Cognition Arousal:  Alert Behavior During Therapy: WFL for tasks assessed/performed Overall Cognitive Status: Within Functional Limits for tasks assessed                                          General Comments General comments (skin integrity, edema, etc.): pt reports symptoms of lightheadedness, nausea, and appears pale when sitting at edge of bed. Pt reports she feels she may pass out, promptly assisted to supine by PT. Pt BP in supine is 112/80, much lower than last BP taken by staff earlier in the day. Pt reports her BP typically runs high, 160s/100s    Exercises Other Exercises Other Exercises: PT provides education on surgical hip exercise packet   Assessment/Plan    PT Assessment Patient needs continued PT services  PT Problem List Decreased strength;Decreased activity tolerance;Decreased balance;Decreased mobility;Decreased knowledge of use of DME;Decreased safety awareness;Decreased knowledge of precautions;Cardiopulmonary status limiting activity;Pain       PT Treatment Interventions DME instruction;Gait training;Stair training;Functional mobility training;Therapeutic activities;Balance training;Therapeutic exercise;Neuromuscular re-education;Patient/family education    PT Goals (Current goals can be found in the Care Plan section)  Acute Rehab PT Goals Patient Stated Goal: to return to independence PT Goal Formulation: With patient Time For Goal Achievement: 11/18/23 Potential to Achieve Goals: Good    Frequency Min 1X/week     Co-evaluation               AM-PAC PT "6 Clicks" Mobility  Outcome Measure Help needed turning from your back to your side while in a flat bed without using bedrails?: A Little Help needed moving from lying on your back to sitting on the side of a flat bed without using bedrails?: A Lot Help needed moving to and from a bed to a chair (including a wheelchair)?: Total Help needed standing up from a chair using your arms (e.g., wheelchair  or bedside chair)?: Total Help needed to walk in hospital room?: Total Help needed climbing 3-5 steps with a railing? : Total 6 Click Score: 9    End of Session   Activity Tolerance: Treatment limited secondary to medical complications (Comment) (hypotension relative to baseline) Patient left: in bed;with call bell/phone within reach;with bed alarm set Nurse Communication: Mobility status PT Visit Diagnosis: Other abnormalities of gait and mobility (R26.89);Muscle weakness (generalized) (M62.81);Pain Pain - Right/Left: Right Pain - part of body: Hip    Time: 0454-0981 PT Time Calculation (min) (ACUTE ONLY): 26 min   Charges:   PT Evaluation $PT Eval Low Complexity: 1 Low   PT General Charges $$ ACUTE PT VISIT: 1 Visit         Arlyss Gandy, PT, DPT Acute Rehabilitation Office [phone]   Arlyss Gandy 11/14/2023, 4:02 PM

## 2023-11-15 ENCOUNTER — Other Ambulatory Visit (HOSPITAL_COMMUNITY): Payer: Self-pay

## 2023-11-15 ENCOUNTER — Encounter (HOSPITAL_COMMUNITY): Payer: Self-pay | Admitting: Orthopaedic Surgery

## 2023-11-15 DIAGNOSIS — M1611 Unilateral primary osteoarthritis, right hip: Secondary | ICD-10-CM | POA: Diagnosis not present

## 2023-11-15 LAB — CBC
HCT: 34.4 % — ABNORMAL LOW (ref 36.0–46.0)
Hemoglobin: 11.3 g/dL — ABNORMAL LOW (ref 12.0–15.0)
MCH: 30.4 pg (ref 26.0–34.0)
MCHC: 32.8 g/dL (ref 30.0–36.0)
MCV: 92.5 fL (ref 80.0–100.0)
Platelets: 264 10*3/uL (ref 150–400)
RBC: 3.72 MIL/uL — ABNORMAL LOW (ref 3.87–5.11)
RDW: 13.2 % (ref 11.5–15.5)
WBC: 14 10*3/uL — ABNORMAL HIGH (ref 4.0–10.5)
nRBC: 0 % (ref 0.0–0.2)

## 2023-11-15 LAB — BASIC METABOLIC PANEL
Anion gap: 7 (ref 5–15)
BUN: 9 mg/dL (ref 8–23)
CO2: 26 mmol/L (ref 22–32)
Calcium: 8.3 mg/dL — ABNORMAL LOW (ref 8.9–10.3)
Chloride: 103 mmol/L (ref 98–111)
Creatinine, Ser: 1.21 mg/dL — ABNORMAL HIGH (ref 0.44–1.00)
GFR, Estimated: 50 mL/min — ABNORMAL LOW (ref >60)
Glucose, Bld: 129 mg/dL — ABNORMAL HIGH (ref 70–99)
Potassium: 4 mmol/L (ref 3.5–5.1)
Sodium: 136 mmol/L (ref 135–145)

## 2023-11-15 MED ORDER — TIZANIDINE HCL 4 MG PO TABS
4.0000 mg | ORAL_TABLET | Freq: Four times a day (QID) | ORAL | 0 refills | Status: AC | PRN
  Filled 2023-11-15: qty 30, 8d supply, fill #0

## 2023-11-15 MED ORDER — BUTALBITAL-APAP-CAFFEINE 50-325-40 MG PO TABS
1.0000 | ORAL_TABLET | Freq: Two times a day (BID) | ORAL | Status: DC | PRN
  Administered 2023-11-15 (×2): 1 via ORAL
  Filled 2023-11-15 (×2): qty 1

## 2023-11-15 MED ORDER — ASPIRIN 81 MG PO CHEW
81.0000 mg | CHEWABLE_TABLET | Freq: Two times a day (BID) | ORAL | 0 refills | Status: AC
  Filled 2023-11-15: qty 30, 15d supply, fill #0

## 2023-11-15 MED ORDER — OXYCODONE HCL 5 MG PO TABS
5.0000 mg | ORAL_TABLET | Freq: Four times a day (QID) | ORAL | 0 refills | Status: DC | PRN
  Filled 2023-11-15: qty 30, 4d supply, fill #0

## 2023-11-15 NOTE — Progress Notes (Signed)
    Durable Medical Equipment  (From admission, onward)           Start     Ordered   11/15/23 0831  DME 3 n 1  Once       Comments: BEDSIDE COMMODE. Confine to one room.   11/15/23 0832   11/14/23 1159  DME Walker rolling  Once       Question Answer Comment  Walker: With 5 Inch Wheels   Patient needs a walker to treat with the following condition Status post total replacement of right hip      11/14/23 1158

## 2023-11-15 NOTE — Progress Notes (Addendum)
Physical Therapy Treatment Patient Details Name: Natalie Bond MRN: [id number] DOB: 1956/09/17 Today's Date: 11/15/2023   History of Present Illness 67 y.o. female presents to [facility] hospital on 11/14/2023 for elective R THA. Pt with post-op symptomatic orthostatic hypotension during PT sessions 12/17 and 11/15/23. PMH includes HTN, migraine, bilateral achilles tendon contracture, LLE lymphedema, L THA 09/2016, spinal surgery 2004, CVA/TIA, DMII, hyperthyroidism, HLD, meniere disease.    PT Comments  Pt received in supine after eating lunch, pt agreeable to therapy session with emphasis on transfer and gait initiation. Pt standing tolerance limited due to continued orthostatic hypotension, however BP slightly more stable this session than previous date and pt able to transfer from bed to chair ~72ft with RW and up to minA. Pt had not been wearing TED hose due to c/o poor fit so unit secretary requested to order her next size up, as pt has LLE lymphedema and needs bigger size on that leg. Pt assisted to don TED hose on RLE after transfer to chair and reports more comfortable when PTA placing them as RN who placed them previous date had put them on incorrectly/not covering her feet. Pt continues to benefit from PT services to progress toward functional mobility goals, not yet ready for DC home from a functional standpoint due to orthostatic hypotension and pt not yet able to perform stairs/household distance gait trial.   Vital Signs  Patient Position (if appropriate) Orthostatic Vitals  Orthostatic Lying   BP- Lying 164/77  Pulse- Lying 63  Orthostatic Sitting  BP- Sitting 173/87  Pulse- Sitting 65  Orthostatic Standing at 0 minutes  BP- Standing at 0 minutes (!) 145/95 (lightheaded)  Pulse- Standing at 0 minutes  (no accurate reading- nail polish)  Orthostatic Standing at 3 minutes  BP- Standing at 3 minutes 138/81 (dizzy/lightheaded)  Pulse- Standing at 3 minutes 78     If plan is  discharge home, recommend the following: A little help with walking and/or transfers;A little help with bathing/dressing/bathroom;Assistance with cooking/housework;Assist for transportation;Help with stairs or ramp for entrance   Can travel by private vehicle        Equipment Recommendations  Rolling walker (2 wheels);BSC/3in1    Recommendations for Other Services       Precautions / Restrictions Precautions Precautions: Fall Precaution Comments: direct anterior THA Restrictions Weight Bearing Restrictions Per Provider Order: Yes RLE Weight Bearing Per Provider Order: Weight bearing as tolerated     Mobility  Bed Mobility Overal bed mobility: Needs Assistance Bed Mobility: Supine to Sit     Supine to sit: Min assist, Used rails     General bed mobility comments: cues not to use rails per home set-up but pt still needs to use rail. Spouse providing some assist to guide/lower BLE to EOB/floor    Transfers Overall transfer level: Needs assistance Equipment used: Rolling walker (2 wheels) Transfers: Sit to/from Stand Sit to Stand: Min assist           General transfer comment: Mod initial cues for safe UE placement and improved body mechanics, CGA to light minA for transfers over,    Ambulation/Gait Ambulation/Gait assistance: Contact guard assist Gait Distance (Feet): 16 Feet Assistive device: Rolling walker (2 wheels) Gait Pattern/deviations: Step-to pattern, Shuffle, Decreased dorsiflexion - right, Decreased stance time - right, Decreased step length - right, Decreased step length - left, Decreased weight shift to right, Narrow base of support   Gait velocity interpretation: <1.31 ft/sec, indicative of household ambulator   General Gait Details:  frequent cues for wider BOS, working on improved hip/knee flexion as pt tending to slide RLE forward using toes to "wiggle" foot under rather than attempting heel strike. Pt reports she is putting more weight on RLE than  she did when pivoting to [facility] in AM. Heavy use of BUE to offload painful surgical limb, very small/shuffled steps. Pt reports increased dizziness as distance progressed so defer longer trial out of room or stairs until later in the day. RN notified of pt symptoms, will need larger size TED hose for leg size (unit secretary states she will order them)   Stairs             Wheelchair Mobility     Tilt Bed    Modified Rankin (Stroke Patients Only)       Balance Overall balance assessment: Needs assistance Sitting-balance support: No upper extremity supported, Feet supported Sitting balance-Leahy Scale: Fair     Standing balance support: Bilateral upper extremity supported, During functional activity, Reliant on assistive device for balance Standing balance-Leahy Scale: Poor Standing balance comment: heavy reliance on RW due to RLE pain                            Cognition Arousal: Alert Behavior During Therapy: WFL for tasks assessed/performed Overall Cognitive Status: Within Functional Limits for tasks assessed                                 General Comments: Pt reports baseline claustrophobia regarding her trunk/neck, but very participatory, she prefers spouse to help her with getting her legs off the bed. Pleasantly cooperative.  Increased time to perform tasks due to pt c/o pain/dizziness.      Exercises Other Exercises Other Exercises: pt reports compliance with HEP per packet in AM, she did perform QS/Ankle pumps during session prior to EOB/OOB    General Comments General comments (skin integrity, edema, etc.): see BP in comments above; HR/SpO2 WFL on RA      Pertinent Vitals/Pain Pain Assessment Pain Assessment: Faces Faces Pain Scale: Hurts even more Pain Location: R lateral hip Pain Descriptors / Indicators: Aching, Discomfort, Grimacing, Operative site guarding, Sore, Tightness Pain Intervention(s): Limited activity within  patient's tolerance, Monitored during session, Premedicated before session, Repositioned, Patient requesting pain meds-RN notified, Ice applied    Home Living                          Prior Function            PT Goals (current goals can now be found in the care plan section) Acute Rehab PT Goals Patient Stated Goal: to return to independence PT Goal Formulation: With patient Time For Goal Achievement: 11/18/23 Progress towards PT goals: Progressing toward goals    Frequency    Min 1X/week      PT Plan      Co-evaluation              AM-PAC PT "6 Clicks" Mobility   Outcome Measure  Help needed turning from your back to your side while in a flat bed without using bedrails?: A Little Help needed moving from lying on your back to sitting on the side of a flat bed without using bedrails?: A Lot Help needed moving to and from a bed to a chair (including a wheelchair)?: A Little Help  needed standing up from a chair using your arms (e.g., wheelchair or bedside chair)?: A Little Help needed to walk in hospital room?: A Little Help needed climbing 3-5 steps with a railing? : Total 6 Click Score: 15    End of Session Equipment Utilized During Treatment: Gait belt Activity Tolerance: Treatment limited secondary to medical complications (Comment);Patient tolerated treatment well;Other (comment) (still orthostatic but able to stand and walk short distance in the room) Patient left: with call bell/phone within reach;in chair;with chair alarm set;with family/visitor present Nurse Communication: Mobility status;Patient requests pain meds;Other (comment) (BP readings, needs larger size TED hose for LLE) PT Visit Diagnosis: Other abnormalities of gait and mobility (R26.89);Muscle weakness (generalized) (M62.81);Pain Pain - Right/Left: Right Pain - part of body: Hip     Time: 1610-9604 PT Time Calculation (min) (ACUTE ONLY): 38 min  Charges:    $Gait Training: 8-22  mins $Therapeutic Activity: 23-37 mins PT General Charges $$ ACUTE PT VISIT: 1 Visit                     Chelby Salata P., PTA Acute Rehabilitation Services Secure Chat Preferred 9a-5:30pm Office: [phone]    Dorathy Kinsman [facility] 11/15/2023, 2:45 PM

## 2023-11-15 NOTE — Progress Notes (Signed)
Subjective: 1 Day Post-Op Procedure(s) (LRB): RIGHT TOTAL HIP ARTHROPLASTY ANTERIOR APPROACH (Right) Patient reports pain as moderate.    Objective: Vital signs in last 24 hours: Temp:  [97.5 F (36.4 C)-98.7 F (37.1 C)] 98.7 F (37.1 C) (12/18 0418) Pulse Rate:  [43-64] 64 (12/18 0418) Resp:  [11-22] 18 (12/18 0418) BP: (105-197)/(66-102) 146/69 (12/18 0418) SpO2:  [88 %-100 %] 88 % (12/18 0418)  Intake/Output from previous day: 12/17 0701 - 12/18 0700 In: 1580 [P.O.:480; I.V.:600; IV Piggyback:500] Out: 2350 [Urine:2200; Blood:150] Intake/Output this shift: No intake/output data recorded.  Recent Labs    11/15/23 0637  HGB 11.3*   Recent Labs    11/15/23 0637  WBC 14.0*  RBC 3.72*  HCT 34.4*  PLT 264   No results for input(s): "NA", "K", "CL", "CO2", "BUN", "CREATININE", "GLUCOSE", "CALCIUM" in the last 72 hours. No results for input(s): "LABPT", "INR" in the last 72 hours.  Sensation intact distally Intact pulses distally Dorsiflexion/Plantar flexion intact Incision: scant drainage   Assessment/Plan: 1 Day Post-Op Procedure(s) (LRB): RIGHT TOTAL HIP ARTHROPLASTY ANTERIOR APPROACH (Right) Up with therapy      [name] 11/15/2023, 7:08 AM

## 2023-11-15 NOTE — Progress Notes (Signed)
Physical Therapy Treatment Patient Details Name: Natalie Bond MRN: [id number] DOB: 02-03-1956 Today's Date: 11/15/2023   History of Present Illness 67 y.o. female presents to [facility] hospital on 11/14/2023 for elective R THA. Pt with post-op symptomatic orthostatic hypotension during PT sessions 12/17 and 11/15/23. PMH includes HTN, migraine, bilateral achilles tendon contracture, LLE lymphedema, L THA 09/2016, spinal surgery 2004, CVA/TIA, DMII, hyperthyroidism, HLD, meniere disease.    PT Comments  Pt received in supine, c/o increased R hip and new headache pain, pt LLE TED hose had arrived so TED hose placed (pt reports hx Lymphedema on LLE and it is more edematous than RLE at baseline). Pt also given handout with Lymphedema centers and call numbers per her request and pt notified her PCP will need to send referral for these locations. Pt BP checked due to c/o increased pain and previous orthostatic BP readings, found to be severely high, RN notified and PTA terminated session at this time, defer OOB mobility for pt safety due to unstable vital signs.  Vital Signs  Pulse Rate 69  Pulse Rate Source Dinamap  BP (!) 208/87  BP Location Left Wrist (pt has IV on upper arm)  BP Method Automatic  Patient Position (if appropriate) Lying (HOB ~30*)    Vital Signs  Pulse Rate 70  Pulse Rate Source Dinamap  BP (!) 201/88  BP Location Right Arm  BP Method Automatic  Patient Position (if appropriate) Lying     If plan is discharge home, recommend the following: A little help with walking and/or transfers;A little help with bathing/dressing/bathroom;Assistance with cooking/housework;Assist for transportation;Help with stairs or ramp for entrance   Can travel by private vehicle        Equipment Recommendations  Rolling walker (2 wheels);BSC/3in1    Recommendations for Other Services       Precautions / Restrictions Precautions Precautions: Fall Precaution Comments: watch BP; no hip  precs-direct anterior Restrictions Weight Bearing Restrictions Per Provider Order: Yes RLE Weight Bearing Per Provider Order: Weight bearing as tolerated     Mobility  Bed Mobility Overal bed mobility: Needs Assistance Bed Mobility: Supine to Sit     Supine to sit: Min assist, Used rails     General bed mobility comments: defer due to elevated BP in supine, checked in both arms; not safe to mobilize OOB    Transfers Overall transfer level: Needs assistance Equipment used: Rolling walker (2 wheels) Transfers: Sit to/from Stand Sit to Stand: Min assist           General transfer comment: defer due to elevated BP    Ambulation/Gait Ambulation/Gait assistance: Contact guard assist Gait Distance (Feet): 16 Feet Assistive device: Rolling walker (2 wheels) Gait Pattern/deviations: Step-to pattern, Shuffle, Decreased dorsiflexion - right, Decreased stance time - right, Decreased step length - right, Decreased step length - left, Decreased weight shift to right, Narrow base of support   Gait velocity interpretation: <1.31 ft/sec, indicative of household ambulator   General Gait Details: defer -BP   Stairs             Wheelchair Mobility     Tilt Bed    Modified Rankin (Stroke Patients Only)       Balance Overall balance assessment: Needs assistance Sitting-balance support: No upper extremity supported, Feet supported Sitting balance-Leahy Scale:  (defer)     Standing balance support: Bilateral upper extremity supported, During functional activity, Reliant on assistive device for balance Standing balance-Leahy Scale: Poor Standing balance comment: defer-BP  Cognition Arousal: Alert Behavior During Therapy: WFL for tasks assessed/performed Overall Cognitive Status: Within Functional Limits for tasks assessed                                 General Comments: Pt reports baseline claustrophobia regarding  her trunk/neck, but very participatory, she prefers spouse to help her with getting her legs off the bed. Pleasantly cooperative.        Exercises Other Exercises Other Exercises: pt reports compliance with HEP per packet in AM, pt performs ankle pumps x10 reps ea Other Exercises: Discussed exercise in light of HTN this session, defer supine/seated HEP completion until after pt is medicated for elevated BP    General Comments General comments (skin integrity, edema, etc.): see BP in comments above; defer OOB due to hypertension, TED hose applied on LLE just prior to PTA checking her BP, both arms checked and reading >200 SBP; ice removed and pt instructed on monitoring area where ice has been due to noted welts on her skin near surgical dressing, unsure if this is bruising related to surgery or if it is related to use of ice on her skin, not to reapply ice in this area and to apply to other areas and monitor skin appearance      Pertinent Vitals/Pain Pain Assessment Pain Assessment: 0-10 Pain Score: 10-Worst pain ever Faces Pain Scale: Hurts whole lot Pain Location: R lateral hip Pain Descriptors / Indicators: Aching, Discomfort, Grimacing, Operative site guarding, Sore, Tightness, Headache (c/o migraine) Pain Intervention(s): Limited activity within patient's tolerance, Monitored during session, Premedicated before session, Patient requesting pain meds-RN notified (ice removed, pt with some welts around dressing from surgery near where ice had been resting, pt notified to wait ~an hour prior to reapplying and apply ice in alternate location to ensure the welts are not related to use of ice.)     PT Goals (current goals can now be found in the care plan section) Acute Rehab PT Goals Patient Stated Goal: to return to independence PT Goal Formulation: With patient Time For Goal Achievement: 11/18/23 Progress towards PT goals: Progressing toward goals (slowly due to elevated BP/pain)     Frequency    Min 1X/week      PT Plan         AM-PAC PT "6 Clicks" Mobility   Outcome Measure  Help needed turning from your back to your side while in a flat bed without using bedrails?: A Little Help needed moving from lying on your back to sitting on the side of a flat bed without using bedrails?: A Lot Help needed moving to and from a bed to a chair (including a wheelchair)?: A Little Help needed standing up from a chair using your arms (e.g., wheelchair or bedside chair)?: A Little Help needed to walk in hospital room?: A Little Help needed climbing 3-5 steps with a railing? : Total 6 Click Score: 15    End of Session Equipment Utilized During Treatment: Gait belt Activity Tolerance: Treatment limited secondary to medical complications (Comment);Patient tolerated treatment well;Other (comment) (still orthostatic but able to stand and walk short distance in the room) Patient left: with call bell/phone within reach;in chair;with chair alarm set;with family/visitor present Nurse Communication: Mobility status;Patient requests pain meds;Other (comment) (BP readings, needs larger size TED hose for LLE) PT Visit Diagnosis: Other abnormalities of gait and mobility (R26.89);Muscle weakness (generalized) (M62.81);Pain Pain - Right/Left: Right Pain -  part of body: Hip     Time: 4403-4742 PT Time Calculation (min) (ACUTE ONLY): 16 min  Charges:    $Therapeutic Activity: 8-22 mins PT General Charges $$ ACUTE PT VISIT: 1 Visit                     Alaia Lordi P., PTA Acute Rehabilitation Services Secure Chat Preferred 9a-5:30pm Office: [phone]    Dorathy Kinsman [facility] 11/15/2023, 5:37 PM

## 2023-11-15 NOTE — Care Management Obs Status (Signed)
MEDICARE OBSERVATION STATUS NOTIFICATION   Patient Details  Name: Natalie Bond MRN: [id number] Date of Birth: 03-26-1956   Medicare Observation Status Notification Given:  Yes    Lawerance Sabal, RN 11/15/2023, 8:00 AM

## 2023-11-16 DIAGNOSIS — M1611 Unilateral primary osteoarthritis, right hip: Secondary | ICD-10-CM | POA: Diagnosis not present

## 2023-11-16 DIAGNOSIS — R2689 Other abnormalities of gait and mobility: Secondary | ICD-10-CM | POA: Diagnosis not present

## 2023-11-16 DIAGNOSIS — R531 Weakness: Secondary | ICD-10-CM | POA: Diagnosis not present

## 2023-11-16 DIAGNOSIS — Z96641 Presence of right artificial hip joint: Secondary | ICD-10-CM | POA: Diagnosis not present

## 2023-11-16 MED ORDER — CLONIDINE HCL 0.1 MG PO TABS
0.1000 mg | ORAL_TABLET | Freq: Three times a day (TID) | ORAL | Status: DC | PRN
Start: 2023-11-16 — End: 2023-11-16

## 2023-11-16 NOTE — Progress Notes (Signed)
Physical Therapy Treatment Patient Details Name: Natalie Bond MRN: [id number] DOB: 1955-12-26 Today's Date: 11/16/2023   History of Present Illness 67 y.o. female presents to [facility] hospital on 11/14/2023 for elective R THA. Pt with post-op symptomatic orthostatic hypotension during PT sessions 12/17 and 11/15/23. PMH includes HTN, migraine, bilateral achilles tendon contracture, LLE lymphedema, L THA 09/2016, spinal surgery 2004, CVA/TIA, DMII, hyperthyroidism, HLD, meniere disease.    PT Comments  Pt received in supine, agreeable to therapy session and eager to participate in gait training to prepare for home. Pt spouse present for guarding/safety instruction with functional mobility tasks and pt needing up to CGA at most for bed mobility, transfers and gait trial with RW support. Pt with improved RLE strength/activation and able to perform longer gait trial this date prior to needing bathroom break and with good RW management. Pt able to recall gait/stair instructions from AM session and TED hose remain donned, pt agreeable to continue using as they are helping her BP stability. Pt continues to benefit from PT services to progress toward functional mobility goals, anticipate pt safe to DC from a functional mobility standpoint once medically cleared.     If plan is discharge home, recommend the following: A little help with walking and/or transfers;A little help with bathing/dressing/bathroom;Assistance with cooking/housework;Assist for transportation;Help with stairs or ramp for entrance   Can travel by private vehicle        Equipment Recommendations  Rolling walker (2 wheels);BSC/3in1 (DME was delivered to her room during session 12/19)    Recommendations for Other Services       Precautions / Restrictions Precautions Precautions: Fall Precaution Comments: watch BP; no hip precs-direct anterior Restrictions Weight Bearing Restrictions Per Provider Order: Yes RLE Weight Bearing Per  Provider Order: Weight bearing as tolerated     Mobility  Bed Mobility Overal bed mobility: Needs Assistance Bed Mobility: Supine to Sit     Supine to sit: Contact guard Sit to supine: Contact guard assist   General bed mobility comments: CGA with use of gait belt as RLE leg lifter, pt cued not to use bed rails to simulate home set-up and is compliant.    Transfers Overall transfer level: Needs assistance Equipment used: Rolling walker (2 wheels) Transfers: Sit to/from Stand Sit to Stand: Contact guard assist           General transfer comment: no cues needed for UE placement with transfers, min cues for RLE placement for decreased pain    Ambulation/Gait Ambulation/Gait assistance: Contact guard assist Gait Distance (Feet): 75 Feet Assistive device: Rolling walker (2 wheels) Gait Pattern/deviations: Decreased stance time - right, Decreased weight shift to right, Step-through pattern Gait velocity: ~0.4 m/s     General Gait Details: intermittent cues for wider BOS, pt able to recall heel-toe pattern better this session and no significant c/o or symptoms of dizziness during trial. Pt with good effort and did not need seated break before achieving typical household distance. Occasional cues for slightly wider BOS   Stairs Stairs: Yes Stairs assistance: Min assist, Mod assist Stair Management: One rail Right, Step to pattern, Forwards Number of Stairs: 3 General stair comments: Pt able to verbalize step sequencing with cues. Also reminded her it is written on her HEP handout if she forgets.   Wheelchair Mobility     Tilt Bed    Modified Rankin (Stroke Patients Only)       Balance Overall balance assessment: Needs assistance Sitting-balance support: No upper extremity supported, Feet supported,  Single extremity supported Sitting balance-Leahy Scale: Good     Standing balance support: Bilateral upper extremity supported, During functional activity, Reliant on  assistive device for balance Standing balance-Leahy Scale: Fair Standing balance comment: fair static standing unsupported, fair with RW for dynamic tasks; poor when unsupported for gait                            Cognition Arousal: Alert Behavior During Therapy: WFL for tasks assessed/performed Overall Cognitive Status: Within Functional Limits for tasks assessed                                 General Comments: Pt reports baseline claustrophobia regarding her trunk/neck, but very participatory. Pleasantly cooperative.        Exercises Other Exercises Other Exercises: seated/supine BLE AROM: anke pumps x10 reps ea Other Exercises: seated RLE AROM: LAQ a few reps for teachback, pt receptive and states she has performed supine HEP per handout Other Exercises: pursed-lip breathing x10 reps multiple times during BP assessment    General Comments General comments (skin integrity, edema, etc.): No verbalized symptoms of lightheadedness and pt without pale pallor in second session. Encouraged continued hydration.      Pertinent Vitals/Pain Pain Assessment Pain Assessment: Faces Faces Pain Scale: Hurts little more Pain Location: R lateral hip Pain Descriptors / Indicators: Aching, Discomfort, Operative site guarding, Sore, Tightness (c/o migraine) Pain Intervention(s): Limited activity within patient's tolerance, Monitored during session, Premedicated before session, Repositioned    Home Living                          Prior Function            PT Goals (current goals can now be found in the care plan section) Acute Rehab PT Goals Patient Stated Goal: to return to independence PT Goal Formulation: With patient Time For Goal Achievement: 11/18/23 Progress towards PT goals: Progressing toward goals    Frequency    Min 1X/week      PT Plan      Co-evaluation              AM-PAC PT "6 Clicks" Mobility   Outcome Measure  Help  needed turning from your back to your side while in a flat bed without using bedrails?: None Help needed moving from lying on your back to sitting on the side of a flat bed without using bedrails?: A Little Help needed moving to and from a bed to a chair (including a wheelchair)?: A Little Help needed standing up from a chair using your arms (e.g., wheelchair or bedside chair)?: A Little Help needed to walk in hospital room?: A Little Help needed climbing 3-5 steps with a railing? : A Lot 6 Click Score: 18    End of Session Equipment Utilized During Treatment: Gait belt Activity Tolerance: Patient tolerated treatment well Patient left: with call bell/phone within reach;with family/visitor present;in bed (spouse present and pt sitting EOB to get dressed) Nurse Communication: Mobility status PT Visit Diagnosis: Other abnormalities of gait and mobility (R26.89);Muscle weakness (generalized) (M62.81);Pain Pain - Right/Left: Right Pain - part of body: Hip     Time: 1501-1520 PT Time Calculation (min) (ACUTE ONLY): 19 min  Charges:    $Gait Training: 8-22 mins PT General Charges $$ ACUTE PT VISIT: 1 Visit  Florina Ou., PTA Acute Rehabilitation Services Secure Chat Preferred 9a-5:30pm Office: [phone]    [facility] 11/16/2023, 3:31 PM

## 2023-11-16 NOTE — Progress Notes (Signed)
Physical Therapy Treatment Patient Details Name: Natalie Bond MRN: [id number] DOB: 06-29-1956 Today's Date: 11/16/2023   History of Present Illness 67 y.o. female presents to [facility] hospital on 11/14/2023 for elective R THA. Pt with post-op symptomatic orthostatic hypotension during PT sessions 12/17 and 11/15/23. PMH includes HTN, migraine, bilateral achilles tendon contracture, LLE lymphedema, L THA 09/2016, spinal surgery 2004, CVA/TIA, DMII, hyperthyroidism, HLD, meniere disease.    PT Comments  Pt received in supine, sleeping but awoken and with good participation and somewhat improved tolerance for transfer and gait training. Pt able to initiate stair training but c/o feeling hot and dizzy during stair trial, so orthostatic BP checked and pt found to have symptomatic orthostatic hypotension. Pt needed chair follow for safety and was able to perform gait trial with CGA to minA up to 27ft before needing to sit in chair due to lightheadedness/fatigue. Pt needing min to modA for stair negotiation. TED hose remained in place on BLE throughout. Pt continues to benefit from PT services to progress toward functional mobility goals, anticipate she will require 1-2 more sessions to ensure vitals remain stable before DC home, to reduce her risk of falls/readmission, care team notified.   Vital Signs - Taken initially prior to activity  Patient Position (if appropriate) Orthostatic Vitals  Orthostatic Lying   BP- Lying 152/74  Pulse- Lying 66  Orthostatic Sitting  BP- Sitting 173/80  Pulse- Sitting 68   Orthostatic Sitting - after gait/stair trial, pt c/o dizziness, readings then taken below:  BP- Sitting 136/90  Pulse- Sitting 68  Orthostatic Standing at 0 minutes  BP- Standing at 0 minutes 106/60  Pulse- Standing at 0 minutes 82     If plan is discharge home, recommend the following: A little help with walking and/or transfers;A little help with bathing/dressing/bathroom;Assistance with  cooking/housework;Assist for transportation;Help with stairs or ramp for entrance   Can travel by private vehicle        Equipment Recommendations  Rolling walker (2 wheels);BSC/3in1 (DME was delivered to her room during session 12/19)    Recommendations for Other Services       Precautions / Restrictions Precautions Precautions: Fall Precaution Comments: watch BP; no hip precs-direct anterior Restrictions Weight Bearing Restrictions Per Provider Order: Yes RLE Weight Bearing Per Provider Order: Weight bearing as tolerated     Mobility  Bed Mobility Overal bed mobility: Needs Assistance Bed Mobility: Supine to Sit, Sit to Supine     Supine to sit: Contact guard Sit to supine: Contact guard assist   General bed mobility comments: from flat bed, pt using gait belt as leg lifter with cues; pt using leg hook technique wtih LLE under RLE to return to supine    Transfers Overall transfer level: Needs assistance Equipment used: Rolling walker (2 wheels) Transfers: Sit to/from Stand Sit to Stand: Contact guard assist           General transfer comment: from EOB and wheelchair heights multiple reps; cues for safe UE placement about 50% of the time.    Ambulation/Gait Ambulation/Gait assistance: Contact guard assist, Min assist, +2 safety/equipment Gait Distance (Feet): 60 Feet (15ft to bathroom, 63ft, 14ft with seated breaks) Assistive device: Rolling walker (2 wheels) Gait Pattern/deviations: Step-to pattern, Shuffle, Decreased dorsiflexion - right, Decreased stance time - right, Decreased step length - right, Decreased step length - left, Decreased weight shift to right, Narrow base of support Gait velocity: <0.4 m/s     General Gait Details: initial cues for improved RLE dorsiflexion,  as well as knee flexion and hip flexion and step length, minA initially progressing to CGA with cues. Chair follow for safety due to pt sx of mild dizziness.   Stairs Stairs:  Yes Stairs assistance: Min assist, Mod assist Stair Management: One rail Right, Step to pattern, Forwards Number of Stairs: 3 General stair comments: 4" steps in PT gym stairs, HHA on side opposite rail, cues for sequencing, pt pushing heavily on side opposite rail for stability, no buckling but c/o feeling hot prior to completion and pt found to be orthostatic just afterward when dinamap brought to gym.   Wheelchair Mobility     Tilt Bed    Modified Rankin (Stroke Patients Only)       Balance Overall balance assessment: Needs assistance Sitting-balance support: No upper extremity supported, Feet supported, Single extremity supported Sitting balance-Leahy Scale: Good     Standing balance support: Bilateral upper extremity supported, During functional activity, Reliant on assistive device for balance Standing balance-Leahy Scale: Fair Standing balance comment: fair static standing unsupported, fair with RW for dynamic tasks; poor when unsupported for gait                            Cognition Arousal: Alert Behavior During Therapy: WFL for tasks assessed/performed Overall Cognitive Status: Within Functional Limits for tasks assessed                                 General Comments: Pt reports baseline claustrophobia regarding her trunk/neck, but very participatory. Pleasantly cooperative.        Exercises Other Exercises Other Exercises: seated/supine BLE AROM: anke pumps x10 reps ea Other Exercises: seated RLE AROM: LAQ a few reps for teachback, pt receptive and states she has performed supine HEP per handout Other Exercises: pursed-lip breathing x10 reps multiple times during BP assessment    General Comments General comments (skin integrity, edema, etc.): improved tolerance for standing today but pt remains orthostatic after some short distance gait trials and stairs to simulate home entry. Pt c/o feeling hot, dizzy, and then feeling as if she  would pass out, with pale pallor observed. Pt reports improvement in symptoms once back in supine. TED hose adjusted prior to OOB mobility and after return to supine to reduce wrinkles, RLE TED hose had slid down overnight.      Pertinent Vitals/Pain Pain Assessment Pain Assessment: Faces Faces Pain Scale: Hurts whole lot Pain Location: R lateral hip Pain Descriptors / Indicators: Aching, Discomfort, Grimacing, Operative site guarding, Sore, Tightness, Headache (c/o migraine) Pain Intervention(s): Limited activity within patient's tolerance, Monitored during session, Premedicated before session, Repositioned     PT Goals (current goals can now be found in the care plan section) Acute Rehab PT Goals Patient Stated Goal: to return to independence PT Goal Formulation: With patient Time For Goal Achievement: 11/18/23 Progress towards PT goals: Progressing toward goals    Frequency    Min 1X/week      PT Plan         AM-PAC PT "6 Clicks" Mobility   Outcome Measure  Help needed turning from your back to your side while in a flat bed without using bedrails?: None Help needed moving from lying on your back to sitting on the side of a flat bed without using bedrails?: A Little Help needed moving to and from a bed to a chair (including a  wheelchair)?: A Little Help needed standing up from a chair using your arms (e.g., wheelchair or bedside chair)?: A Little Help needed to walk in hospital room?: A Lot (chair follow for safety and mod cues) Help needed climbing 3-5 steps with a railing? : A Lot 6 Click Score: 17    End of Session Equipment Utilized During Treatment: Gait belt Activity Tolerance: Treatment limited secondary to medical complications (Comment);Patient tolerated treatment well;Other (comment);Patient limited by pain (still orthostatic but able to stand and walk short distance in the room) Patient left: with call bell/phone within reach;in chair;with chair alarm  set;with family/visitor present Nurse Communication: Mobility status;Patient requests pain meds;Other (comment) (BP readings, she needs second PT session) PT Visit Diagnosis: Other abnormalities of gait and mobility (R26.89);Muscle weakness (generalized) (M62.81);Pain Pain - Right/Left: Right Pain - part of body: Hip     Time: 4098-1191 PT Time Calculation (min) (ACUTE ONLY): 63 min  Charges:    $Gait Training: 23-37 mins $Therapeutic Exercise: 8-22 mins $Therapeutic Activity: 8-22 mins PT General Charges $$ ACUTE PT VISIT: 1 Visit                     Priyal Musquiz P., PTA Acute Rehabilitation Services Secure Chat Preferred 9a-5:30pm Office: [phone]    Dorathy Kinsman [facility] 11/16/2023, 12:23 PM

## 2023-11-16 NOTE — Discharge Summary (Signed)
Patient ID: Natalie Bond MRN: [id number] DOB/AGE: 66-Feb-1957 66 y.o.  Admit date: 11/14/2023 Discharge date: 11/16/2023  Admission Diagnoses:  Principal Problem:   Unilateral primary osteoarthritis, right hip Active Problems:   OA (osteoarthritis) of hip   Status post total replacement of right hip   Discharge Diagnoses:  Same  Past Medical History:  Diagnosis Date   Anxiety    Arthritis    Arthritis    back, wrists   Cancer (HCC)    skin cancer   Carpal tunnel syndrome    Complication of anesthesia    DDD (degenerative disc disease)    Diabetes mellitus without complication (HCC)    does not take meds   Dysrhythmia 1995   irreg hr   Full dentures    GERD (gastroesophageal reflux disease)    Headache    High cholesterol    History of bronchitis    History of hiatal hernia    History of kidney stones    2015   Hypertension    Hyperthyroidism    PONV (postoperative nausea and vomiting)    Sleep apnea    pt. does not use a CPAP or BiPAP   Stroke (HCC)    Pt. states possible mini stroke in 2014, no deficits   Vertigo     Surgeries: Procedure(s): RIGHT TOTAL HIP ARTHROPLASTY ANTERIOR APPROACH on 11/14/2023   Consultants:   Discharged Condition: Improved  Hospital Course: Natalie Bond is an 67 y.o. female who was admitted 11/14/2023 for operative treatment ofUnilateral primary osteoarthritis, right hip. Patient has severe unremitting pain that affects sleep, daily activities, and work/hobbies. After pre-op clearance the patient was taken to the operating room on 11/14/2023 and underwent  Procedure(s): RIGHT TOTAL HIP ARTHROPLASTY ANTERIOR APPROACH.    Patient was given perioperative antibiotics:  Anti-infectives (From admission, onward)    Start     Dose/Rate Route Frequency Ordered Stop   11/14/23 1400  vancomycin (VANCOCIN) IVPB 1000 mg/200 mL premix        1,000 mg 200 mL/hr over 60 Minutes Intravenous Every 12 hours 11/14/23 1314 11/14/23 1523    11/14/23 0715  vancomycin (VANCOCIN) IVPB 1000 mg/200 mL premix        1,000 mg 200 mL/hr over 60 Minutes Intravenous  Once 11/14/23 0711 11/14/23 0819   11/14/23 0711  vancomycin (VANCOCIN) 1-5 GM/200ML-% IVPB       Note to Pharmacy: Lacie Draft A: cabinet override      11/14/23 0711 11/14/23 0758   11/14/23 0600  ceFAZolin (ANCEF) IVPB 2g/100 mL premix  Status:  Discontinued        2 g 200 mL/hr over 30 Minutes Intravenous On call to O.R. 11/14/23 0541 11/14/23 0711   11/14/23 0548  ceFAZolin (ANCEF) 2-4 GM/100ML-% IVPB  Status:  Discontinued       Note to Pharmacy: Lacie Draft A: cabinet override      11/14/23 0548 11/14/23 0706        Patient was given sequential compression devices, early ambulation, and chemoprophylaxis to prevent DVT.  Patient benefited maximally from hospital stay and there were no complications.    Recent vital signs: Patient Vitals for the past 24 hrs:  BP Temp Temp src Pulse Resp SpO2  11/16/23 1530 (!) 150/78 -- -- -- -- --  11/16/23 0505 (!) 159/75 99.8 F (37.7 C) Oral 68 17 92 %  11/15/23 2010 (!) 163/81 99.6 F (37.6 C) Oral 64 18 91 %  11/15/23 1843 (!) 168/78 -- -- -- -- --  11/15/23 1741 (!) 172/84 -- -- 67 -- --  11/15/23 1723 (!) 201/88 -- -- 70 -- --  11/15/23 1720 (!) 208/87 -- -- 69 -- --     Recent laboratory studies:  Recent Labs    11/15/23 0637  WBC 14.0*  HGB 11.3*  HCT 34.4*  PLT 264  NA 136  K 4.0  CL 103  CO2 26  BUN 9  CREATININE 1.21*  GLUCOSE 129*  CALCIUM 8.3*     Discharge Medications:   Allergies as of 11/16/2023       Reactions   Dilaudid [hydromorphone Hcl] Anaphylaxis   Dilaudid [hydromorphone Hcl] Anaphylaxis   [name]    Penicillins Anaphylaxis, Hives, Rash   Has patient had a PCN reaction causing immediate rash, facial/tongue/throat swelling, SOB or lightheadedness with hypotension: Yes Has patient had a PCN reaction causing severe rash involving mucus membranes or  skin necrosis: No Has patient had a PCN reaction that required hospitalization Yes Has patient had a PCN reaction occurring within the last 10 years: No If all of the above answers are "NO", then may proceed with Cephalosporin use.   Atorvastatin    myalgias   Carvedilol    groggy   Mobic [meloxicam] Other (See Comments)   Heartburn and elevated bp   Valsartan    Headache and caused kidney issues         Medication List     STOP taking these medications    aspirin 81 MG tablet Replaced by: Aspirin Low Dose 81 MG chewable tablet   HYDROcodone-acetaminophen 5-325 MG tablet Commonly known as: NORCO/VICODIN       TAKE these medications    acetaminophen 650 MG CR tablet Commonly known as: TYLENOL Take 1,300 mg by mouth every 8 (eight) hours as needed for pain.   amLODipine 5 MG tablet Commonly known as: NORVASC Take 1 tablet by mouth once daily What changed:  when to take this reasons to take this   Aspirin Low Dose 81 MG chewable tablet Generic drug: aspirin Chew 1 tablet (81 mg total) by mouth 2 (two) times daily. Replaces: aspirin 81 MG tablet   butalbital-acetaminophen-caffeine 50-325-40 MG tablet Commonly known as: FIORICET Take 1 tablet by mouth every 12 (twelve) hours as needed for headache.   calcitRIOL 0.5 MCG capsule Commonly known as: Rocaltrol Take 1 capsule (0.5 mcg total) by mouth 2 (two) times daily. What changed:  when to take this reasons to take this   calcium carbonate 500 MG chewable tablet Commonly known as: Tums Chew 2 tablets (400 mg of elemental calcium total) by mouth 3 (three) times daily.   cholestyramine 4 g packet Commonly known as: Questran Take 1 packet (4 g total) by mouth 3 (three) times daily with meals.   cloNIDine 0.1 MG tablet Commonly known as: CATAPRES Take 0.1 mg by mouth 3 (three) times daily as needed (If BP gets over 160). With Metoprolol   clotrimazole-betamethasone cream Commonly known as: LOTRISONE Apply  1 Application topically 2 (two) times daily. What changed:  when to take this reasons to take this   cyclobenzaprine 10 MG tablet Commonly known as: FLEXERIL Take 10 mg by mouth at bedtime.   dicyclomine 20 MG tablet Commonly known as: BENTYL Take 20 mg by mouth 3 (three) times daily as needed for spasms.   famotidine 20 MG tablet Commonly known as: PEPCID Take 20 mg by mouth 2 (two) times daily with a meal.   gabapentin 300  MG capsule Commonly known as: NEURONTIN Take 300 mg by mouth at bedtime as needed (pain).   levothyroxine 100 MCG tablet Commonly known as: SYNTHROID Take 100 mcg by mouth daily before breakfast.   meclizine 25 MG tablet Commonly known as: ANTIVERT Take 1 tablet (25 mg total) by mouth 2 (two) times daily as needed for dizziness.   meloxicam 7.5 MG tablet Commonly known as: MOBIC Take 7.5 mg by mouth every other day.   metoprolol succinate 100 MG 24 hr tablet Commonly known as: TOPROL-XL Take 100 mg by mouth in the morning and at bedtime. Take with or immediately following a meal.   ondansetron 4 MG tablet Commonly known as: ZOFRAN Take 1-2 tablets (4-8 mg total) by mouth every 8 (eight) hours as needed for nausea or vomiting.   oxyCODONE 5 MG immediate release tablet Commonly known as: Oxy IR/ROXICODONE Take 1-2 tablets (5-10 mg total) by mouth every 6 (six) hours as needed for moderate pain (pain score 4-6) (pain score 4-6).   pantoprazole 40 MG tablet Commonly known as: PROTONIX Take 40 mg by mouth 2 (two) times daily before a meal.   PARoxetine 10 MG tablet Commonly known as: PAXIL Take 10 mg by mouth daily as needed (Hot flashes).   potassium chloride SA 20 MEQ tablet Commonly known as: KLOR-CON M Take 20 mEq by mouth every other day.   RABEprazole 20 MG tablet Commonly known as: ACIPHEX Take 1 tablet (20 mg total) by mouth 2 (two) times daily.   sucralfate 1 GM/10ML suspension Commonly known as: CARAFATE 1 gram BID for 2 weeks    Systane Complete 0.6 % Soln Generic drug: Propylene Glycol Place 1 drop into both eyes as needed (dry eyes).   tiZANidine 4 MG tablet Commonly known as: ZANAFLEX Take 1 tablet (4 mg total) by mouth every 6 (six) hours as needed for muscle spasms.   topiramate 100 MG tablet Commonly known as: TOPAMAX Take 100 mg by mouth at bedtime.               Durable Medical Equipment  (From admission, onward)           Start     Ordered   11/15/23 0831  DME 3 n 1  Once       Comments: BEDSIDE COMMODE. Confine to one room.   11/15/23 1324   11/14/23 1159  DME Walker rolling  Once       Question Answer Comment  Walker: With 5 Inch Wheels   Patient needs a walker to treat with the following condition Status post total replacement of right hip      11/14/23 1158            Diagnostic Studies: DG Pelvis Portable Result Date: 11/14/2023 CLINICAL DATA:  Status post right total hip arthroplasty. EXAM: PORTABLE PELVIS 1-2 VIEWS COMPARISON:  October 16, 2023. FINDINGS: There is been interval placement of right total hip arthroplasty. Femoral and acetabular components are well situated. Expected postoperative changes are seen surrounding soft tissues. IMPRESSION: Interval placement of right total hip arthroplasty. Electronically Signed   By: Lupita Raider M.D.   On: 11/14/2023 12:40   DG HIP UNILAT WITH PELVIS 1V RIGHT Result Date: 11/14/2023 CLINICAL DATA:  Right total hip arthroplasty. EXAM: DG HIP (WITH OR WITHOUT PELVIS) 1V RIGHT Radiation exposure index: 2.5153 mGy. COMPARISON:  October 16, 2023. FINDINGS: Five intraoperative fluoroscopic images were obtained of the right hip. The femoral and acetabular components appear to be  well situated. IMPRESSION: Fluoroscopic guidance provided during right total hip arthroplasty. Electronically Signed   By: Lupita Raider M.D.   On: 11/14/2023 12:39   CT HEAD WO CONTRAST Result Date: 10/23/2023 CLINICAL DATA:  Vertigo EXAM: CT HEAD  WITHOUT CONTRAST TECHNIQUE: Contiguous axial images were obtained from the base of the skull through the vertex without intravenous contrast. RADIATION DOSE REDUCTION: This exam was performed according to the departmental dose-optimization program which includes automated exposure control, adjustment of the mA and/or kV according to patient size and/or use of iterative reconstruction technique. COMPARISON:  12/19/2021 FINDINGS: Brain: Normal anatomic configuration. No abnormal intra or extra-axial mass lesion or fluid collection. No abnormal mass effect or midline shift. No evidence of acute intracranial hemorrhage or infarct. Ventricular size is normal. Cerebellum unremarkable. Vascular: Unremarkable Skull: Intact Sinuses/Orbits: Paranasal sinuses are clear. Orbits are unremarkable. Other: Mastoid air cells and middle ear cavities are clear. IMPRESSION: 1. No acute intracranial abnormality. Electronically Signed   By: Helyn Numbers M.D.   On: 10/23/2023 00:19   DG Chest Portable 1 View Result Date: 10/22/2023 CLINICAL DATA:  Hypertension chest tightness EXAM: PORTABLE CHEST 1 VIEW COMPARISON:  12/19/2021 FINDINGS: Mildly diminished lung volumes. No acute airspace disease or effusion. Minimal atelectasis at the left base. Normal cardiac size. No pneumothorax IMPRESSION: Mildly diminished lung volumes with minimal left base atelectasis. Electronically Signed   By: Jasmine Pang M.D.   On: 10/22/2023 23:51    Disposition: Discharge disposition: 01-Home or Self Care          Follow-up Information     Garlan Fillers, MD Follow up.   Specialty: Internal Medicine Contact information: [address] [place] [address] [phone]         [facility] Follow up.   Specialty: Home Health Services Why: home health services will be provided [facility] Contact information: [address] [place] [address] [phone]         [name], MD Follow up in 2 week(s).   Specialty: Orthopedic Surgery Contact information: [address] [place] [address] [phone]                  Signed: [name] 11/16/2023, 4:23 PM

## 2023-11-16 NOTE — Discharge Planning (Signed)
Patient alert and oriented. IV access removed. DME equipment present at bedside. Second PT session went well. Patient discharge teaching provided to patient at bedside. TOC meds at bedside. Patient transported to lobby via volunteer services for private transportation home via husband.

## 2023-11-16 NOTE — Progress Notes (Signed)
Patient ID: Natalie Bond, female   DOB: September 07, 1956, 68 y.o.   MRN: [id number] The patient is awake and alert this morning.  She has been working with physical therapy.  Her right operative hip is stable.  She can be discharged home this afternoon.

## 2023-11-18 DIAGNOSIS — R1319 Other dysphagia: Secondary | ICD-10-CM | POA: Diagnosis not present

## 2023-11-18 DIAGNOSIS — E7800 Pure hypercholesterolemia, unspecified: Secondary | ICD-10-CM | POA: Diagnosis not present

## 2023-11-18 DIAGNOSIS — G43909 Migraine, unspecified, not intractable, without status migrainosus: Secondary | ICD-10-CM | POA: Diagnosis not present

## 2023-11-18 DIAGNOSIS — I499 Cardiac arrhythmia, unspecified: Secondary | ICD-10-CM | POA: Diagnosis not present

## 2023-11-18 DIAGNOSIS — H90A32 Mixed conductive and sensorineural hearing loss, unilateral, left ear with restricted hearing on the contralateral side: Secondary | ICD-10-CM | POA: Diagnosis not present

## 2023-11-18 DIAGNOSIS — H8109 Meniere's disease, unspecified ear: Secondary | ICD-10-CM | POA: Diagnosis not present

## 2023-11-18 DIAGNOSIS — M4310 Spondylolisthesis, site unspecified: Secondary | ICD-10-CM | POA: Diagnosis not present

## 2023-11-18 DIAGNOSIS — M4690 Unspecified inflammatory spondylopathy, site unspecified: Secondary | ICD-10-CM | POA: Diagnosis not present

## 2023-11-18 DIAGNOSIS — I872 Venous insufficiency (chronic) (peripheral): Secondary | ICD-10-CM | POA: Diagnosis not present

## 2023-11-18 DIAGNOSIS — I10 Essential (primary) hypertension: Secondary | ICD-10-CM | POA: Diagnosis not present

## 2023-11-18 DIAGNOSIS — F5104 Psychophysiologic insomnia: Secondary | ICD-10-CM | POA: Diagnosis not present

## 2023-11-18 DIAGNOSIS — H6502 Acute serous otitis media, left ear: Secondary | ICD-10-CM | POA: Diagnosis not present

## 2023-11-18 DIAGNOSIS — E669 Obesity, unspecified: Secondary | ICD-10-CM | POA: Diagnosis not present

## 2023-11-18 DIAGNOSIS — G4733 Obstructive sleep apnea (adult) (pediatric): Secondary | ICD-10-CM | POA: Diagnosis not present

## 2023-11-18 DIAGNOSIS — Z471 Aftercare following joint replacement surgery: Secondary | ICD-10-CM | POA: Diagnosis not present

## 2023-11-18 DIAGNOSIS — H7290 Unspecified perforation of tympanic membrane, unspecified ear: Secondary | ICD-10-CM | POA: Diagnosis not present

## 2023-11-18 DIAGNOSIS — M180 Bilateral primary osteoarthritis of first carpometacarpal joints: Secondary | ICD-10-CM | POA: Diagnosis not present

## 2023-11-18 DIAGNOSIS — G8929 Other chronic pain: Secondary | ICD-10-CM | POA: Diagnosis not present

## 2023-11-18 DIAGNOSIS — K219 Gastro-esophageal reflux disease without esophagitis: Secondary | ICD-10-CM | POA: Diagnosis not present

## 2023-11-18 DIAGNOSIS — E890 Postprocedural hypothyroidism: Secondary | ICD-10-CM | POA: Diagnosis not present

## 2023-11-18 DIAGNOSIS — F419 Anxiety disorder, unspecified: Secondary | ICD-10-CM | POA: Diagnosis not present

## 2023-11-18 DIAGNOSIS — M51379 Other intervertebral disc degeneration, lumbosacral region without mention of lumbar back pain or lower extremity pain: Secondary | ICD-10-CM | POA: Diagnosis not present

## 2023-11-18 DIAGNOSIS — M205X1 Other deformities of toe(s) (acquired), right foot: Secondary | ICD-10-CM | POA: Diagnosis not present

## 2023-11-18 DIAGNOSIS — E1142 Type 2 diabetes mellitus with diabetic polyneuropathy: Secondary | ICD-10-CM | POA: Diagnosis not present

## 2023-11-27 ENCOUNTER — Ambulatory Visit (INDEPENDENT_AMBULATORY_CARE_PROVIDER_SITE_OTHER): Payer: PPO | Admitting: Orthopaedic Surgery

## 2023-11-27 ENCOUNTER — Encounter: Payer: Self-pay | Admitting: Orthopaedic Surgery

## 2023-11-27 DIAGNOSIS — Z96641 Presence of right artificial hip joint: Secondary | ICD-10-CM

## 2023-11-27 MED ORDER — HYDROCODONE-ACETAMINOPHEN 5-325 MG PO TABS: 1.0000 | ORAL_TABLET | Freq: Four times a day (QID) | ORAL | 0 refills | Status: DC | PRN

## 2023-11-27 NOTE — Progress Notes (Signed)
The patient comes in today for first postoperative visit status post a right total hip replacement.  We replaced her left hip years ago.  She has been compliant with a baby aspirin twice daily.  She was on this once daily prior to surgery.  She denies any calf pain and denies any issues.  She is ambulate with a cane.  Home therapy released her today.  She is on chronic hydrocodone through her primary care physician.  I can refill this once a day.  Her right operative hip incision looks good.  There are some swelling to be expected but it is more firm than a seroma.  She can try intermittent ice and heat and this will help as well as time.  Her leg lengths appear equal.  From my standpoint we will see her back in a month to see how she is doing overall but no x-rays are needed.

## 2023-12-05 ENCOUNTER — Telehealth: Payer: Self-pay

## 2023-12-05 NOTE — Telephone Encounter (Signed)
 Patient called wanting to know is it normal for her right hip to have a sharp quick pain when moving a certain way?  Patient had right THA on 11/14/2023.  Cb# [phone].  Please advise.  Thank You

## 2023-12-14 ENCOUNTER — Encounter: Payer: PPO | Admitting: Orthopaedic Surgery

## 2023-12-25 ENCOUNTER — Ambulatory Visit (INDEPENDENT_AMBULATORY_CARE_PROVIDER_SITE_OTHER): Payer: PPO | Admitting: Orthopaedic Surgery

## 2023-12-25 ENCOUNTER — Encounter: Payer: Self-pay | Admitting: Orthopaedic Surgery

## 2023-12-25 DIAGNOSIS — Z96641 Presence of right artificial hip joint: Secondary | ICD-10-CM

## 2023-12-25 NOTE — Progress Notes (Signed)
The patient comes in today at around 6 weeks status post a right total hip replacement.  We replaced her left hip in 2017.  She is 11 and very active.  She says she is doing well and reports good range of motion and strength.  She has a normal-appearing gait.  Her leg lengths appear normal.  She tolerates me putting a chip to full rotation with no issues at all.  At this point we will see her back in 6 months with a standing low AP pelvis.

## 2023-12-29 ENCOUNTER — Other Ambulatory Visit: Payer: Self-pay | Admitting: Obstetrics and Gynecology

## 2023-12-29 ENCOUNTER — Encounter: Payer: Self-pay | Admitting: Obstetrics and Gynecology

## 2024-01-08 DIAGNOSIS — M4826 Kissing spine, lumbar region: Secondary | ICD-10-CM | POA: Diagnosis not present

## 2024-01-08 DIAGNOSIS — Z5181 Encounter for therapeutic drug level monitoring: Secondary | ICD-10-CM | POA: Diagnosis not present

## 2024-01-08 DIAGNOSIS — Z79891 Long term (current) use of opiate analgesic: Secondary | ICD-10-CM | POA: Diagnosis not present

## 2024-01-08 DIAGNOSIS — M47816 Spondylosis without myelopathy or radiculopathy, lumbar region: Secondary | ICD-10-CM | POA: Diagnosis not present

## 2024-02-06 DIAGNOSIS — M4826 Kissing spine, lumbar region: Secondary | ICD-10-CM | POA: Diagnosis not present

## 2024-02-08 DIAGNOSIS — G4733 Obstructive sleep apnea (adult) (pediatric): Secondary | ICD-10-CM | POA: Diagnosis not present

## 2024-02-08 DIAGNOSIS — M545 Low back pain, unspecified: Secondary | ICD-10-CM | POA: Diagnosis not present

## 2024-02-08 DIAGNOSIS — I10 Essential (primary) hypertension: Secondary | ICD-10-CM | POA: Diagnosis not present

## 2024-02-08 DIAGNOSIS — E1151 Type 2 diabetes mellitus with diabetic peripheral angiopathy without gangrene: Secondary | ICD-10-CM | POA: Diagnosis not present

## 2024-02-08 DIAGNOSIS — I739 Peripheral vascular disease, unspecified: Secondary | ICD-10-CM | POA: Diagnosis not present

## 2024-02-08 DIAGNOSIS — G8929 Other chronic pain: Secondary | ICD-10-CM | POA: Diagnosis not present

## 2024-02-08 DIAGNOSIS — E039 Hypothyroidism, unspecified: Secondary | ICD-10-CM | POA: Diagnosis not present

## 2024-02-08 DIAGNOSIS — E785 Hyperlipidemia, unspecified: Secondary | ICD-10-CM | POA: Diagnosis not present

## 2024-02-08 DIAGNOSIS — K219 Gastro-esophageal reflux disease without esophagitis: Secondary | ICD-10-CM | POA: Diagnosis not present

## 2024-02-08 DIAGNOSIS — E1140 Type 2 diabetes mellitus with diabetic neuropathy, unspecified: Secondary | ICD-10-CM | POA: Diagnosis not present

## 2024-03-04 ENCOUNTER — Telehealth: Payer: Self-pay | Admitting: Gastroenterology

## 2024-03-04 NOTE — Telephone Encounter (Signed)
 Inbound call from patient stating she needs a refill for famotidine. States she has been having GERD pain and has been unable to eat. States she tries to use the bathroom and it is painful. Patient is requesting a call back. Please advise, thank you.

## 2024-03-04 NOTE — Telephone Encounter (Signed)
 Attempted to reach pt by phone. Line rings then states voice mail is full. Will attempt later

## 2024-03-05 ENCOUNTER — Other Ambulatory Visit: Payer: Self-pay

## 2024-03-05 MED ORDER — PANTOPRAZOLE SODIUM 40 MG PO TBEC
40.0000 mg | DELAYED_RELEASE_TABLET | Freq: Two times a day (BID) | ORAL | 1 refills | Status: DC
Start: 2024-03-05 — End: 2024-06-13

## 2024-03-05 MED ORDER — FAMOTIDINE 20 MG PO TABS: 20.0000 mg | ORAL_TABLET | Freq: Two times a day (BID) | ORAL | 1 refills | Status: DC

## 2024-03-05 NOTE — Telephone Encounter (Signed)
 Attempted to reach pt again today by phone and no answer and voice mail full.

## 2024-03-05 NOTE — Telephone Encounter (Signed)
 Spoke with the pt and discussed recommendations er GM.  She agrees to the [facility] referral. That has been entered with faxed records.  She does not want to set up office visit with [place] right now. She will call back. She also states she will try the PPI and H2 blocker and call back if this does not help.

## 2024-03-05 NOTE — Telephone Encounter (Signed)
 I spoke with the pt and she tells me that at her last EGD Dr Meridee Score mentioned she may need surgical referral for Hiatal hernia.  She also needs to have pepcid and protonix refilled. I have sent the refills as requested.  Do you want to make [facility] referral?

## 2024-03-05 NOTE — Telephone Encounter (Signed)
 If doing OK, then can refill current prescriptions. If patient is feeling that she is still breaking through with her PPI + Pepcid, then I would like to transition PPI to Voquenza 20 mg daily. Can schedule followup as well. We can place referral for hiatal hernia repair/fundoplication as well ([facility]) if she wishes to have discussion or wait until clinic discussion. APP or myself in clinic OK. Thanks. GM

## 2024-03-28 DIAGNOSIS — Z5181 Encounter for therapeutic drug level monitoring: Secondary | ICD-10-CM | POA: Diagnosis not present

## 2024-03-28 DIAGNOSIS — M47816 Spondylosis without myelopathy or radiculopathy, lumbar region: Secondary | ICD-10-CM | POA: Diagnosis not present

## 2024-03-28 DIAGNOSIS — G894 Chronic pain syndrome: Secondary | ICD-10-CM | POA: Diagnosis not present

## 2024-03-28 DIAGNOSIS — M4826 Kissing spine, lumbar region: Secondary | ICD-10-CM | POA: Diagnosis not present

## 2024-04-04 ENCOUNTER — Encounter: Payer: Self-pay | Admitting: Physician Assistant

## 2024-04-04 ENCOUNTER — Other Ambulatory Visit (INDEPENDENT_AMBULATORY_CARE_PROVIDER_SITE_OTHER): Payer: Self-pay

## 2024-04-04 ENCOUNTER — Ambulatory Visit: Admitting: Physician Assistant

## 2024-04-04 DIAGNOSIS — M25571 Pain in right ankle and joints of right foot: Secondary | ICD-10-CM

## 2024-04-04 DIAGNOSIS — M25572 Pain in left ankle and joints of left foot: Secondary | ICD-10-CM

## 2024-04-04 NOTE — Progress Notes (Signed)
 HPI: Natalie Bond returns today due to right ankle pain.  She is status post right total hip arthroplasty 11/14/2023.  She reports that she took some Zanaflex  and that it made her loopy.  States that her insurance company would not approve her previous muscle relaxant which she had tolerated well for years.  Unfortunately she twisted both ankles 2 weeks ago.  Left ankle has improved.  Right ankle she is still having throbbing pain lateral aspect.  Feels that she may have fractured it.  She is able to bear weight.  She notes that in the past with the previous ankle injury she has been unable to tolerate an ASO brace.  She does have a cam walker boot at home.  No mechanical symptoms right ankle  Review of systems see: HPI otherwise negative  Physical exam: General Well-developed well-nourished female in no acute distress mood affect appropriate. Respirations unlabored on room air Vascular: Dorsal pedal pulses equal and symmetric Bilateral ankles slight swelling lateral malleolus bilaterally.  Tenderness over the right ankle distal fibula.  No real tenderness over the posterior tibial tendons peroneal tendons.  She has good range of motion of the left ankle without pain with dorsiflexion plantarflexion inversion eversion.  Right ankle discomfort with dorsiflexion plantarflexion but overall good range of motion.  Inversion eversion present and eversion right foot causes pain lateral malleolus.  Radiographs: Right ankle: 3 views showed the ankle mortise to be well maintained.  Minimally displaced distal fibula avulsion fracture.  No other fractures or bony abnormalities. Left ankle 3 views: Talus well located within the ankle there is no diastases.  No acute fractures acute findings.   Impression: Minimally displaced distal fibula fracture.  Plan: Patient does have a cam walker boot at home she would prefer to go on this versus an ASO brace therefore she will wear her cam walker boot when up ambulating.   She has a follow-up appointment with Dr. Lucienne Ryder May 28 and can be reevaluated for her ankle at that time recommend 3 views of the right ankle at that time.  Questions were encouraged and answered.  RICE for comfort.

## 2024-04-09 DIAGNOSIS — Z1212 Encounter for screening for malignant neoplasm of rectum: Secondary | ICD-10-CM | POA: Diagnosis not present

## 2024-04-09 DIAGNOSIS — E1140 Type 2 diabetes mellitus with diabetic neuropathy, unspecified: Secondary | ICD-10-CM | POA: Diagnosis not present

## 2024-04-09 DIAGNOSIS — E039 Hypothyroidism, unspecified: Secondary | ICD-10-CM | POA: Diagnosis not present

## 2024-04-09 DIAGNOSIS — E785 Hyperlipidemia, unspecified: Secondary | ICD-10-CM | POA: Diagnosis not present

## 2024-04-09 DIAGNOSIS — I10 Essential (primary) hypertension: Secondary | ICD-10-CM | POA: Diagnosis not present

## 2024-04-17 ENCOUNTER — Other Ambulatory Visit: Payer: Self-pay

## 2024-04-17 ENCOUNTER — Ambulatory Visit (INDEPENDENT_AMBULATORY_CARE_PROVIDER_SITE_OTHER): Admitting: Physician Assistant

## 2024-04-17 DIAGNOSIS — S82839A Other fracture of upper and lower end of unspecified fibula, initial encounter for closed fracture: Secondary | ICD-10-CM | POA: Diagnosis not present

## 2024-04-17 NOTE — Progress Notes (Signed)
 HPI: [name] returns today follow-up of her right ankle distal fibular avulsion fracture.  She states she has pain and sensitivity over the lateral ankle.  She has been using the cam walker boot which she had at home.  She states that the inflation device in the boot is not working properly.  She does find the compression to be beneficial.  She has had no new injury to the ankle.  At last visit patient thought that she had a follow-up appointment with Dr. Lucienne Ryder in May for her hip this actually should be for July for a 21-month postop visit that at that time we will obtain an low AP pelvis for follow-up of her right total hip replacement.  Review of systems: See HPI otherwise negative  Physical exam: General Well-developed well-nourished female who ambulates with a slight antalgic gait in a cam walker boot no other assistive device. Right ankle: Tenderness over the lateral malleolus.  No tenderness over the medial malleolus.  Good range of motion of the ankle otherwise.  Calf supple.  Radiographs: Right ankle 3 views shows ankle mortise to be well-maintained.  Minimally displaced distal fibula avulsion fracture remains unchanged.  No other fractures bony abnormalities..  No significant consolidation.  Impression: Minimally displaced distal fibula fracture   Plan: Fitted with a short leg Cam walker boot.  She is weightbearing as tolerated in the cam walker boot.  See her back in 4 weeks obtain 3 views of the right ankle at that time.  Questions were encouraged and answered at length.

## 2024-04-18 DIAGNOSIS — Z5181 Encounter for therapeutic drug level monitoring: Secondary | ICD-10-CM | POA: Diagnosis not present

## 2024-04-18 DIAGNOSIS — M47816 Spondylosis without myelopathy or radiculopathy, lumbar region: Secondary | ICD-10-CM | POA: Diagnosis not present

## 2024-05-02 DIAGNOSIS — R1319 Other dysphagia: Secondary | ICD-10-CM | POA: Diagnosis not present

## 2024-05-02 DIAGNOSIS — F1190 Opioid use, unspecified, uncomplicated: Secondary | ICD-10-CM | POA: Diagnosis not present

## 2024-05-02 DIAGNOSIS — K5289 Other specified noninfective gastroenteritis and colitis: Secondary | ICD-10-CM | POA: Diagnosis not present

## 2024-05-02 DIAGNOSIS — K219 Gastro-esophageal reflux disease without esophagitis: Secondary | ICD-10-CM | POA: Diagnosis not present

## 2024-05-02 DIAGNOSIS — K449 Diaphragmatic hernia without obstruction or gangrene: Secondary | ICD-10-CM | POA: Diagnosis not present

## 2024-05-02 DIAGNOSIS — Z8673 Personal history of transient ischemic attack (TIA), and cerebral infarction without residual deficits: Secondary | ICD-10-CM | POA: Diagnosis not present

## 2024-05-07 DIAGNOSIS — M47816 Spondylosis without myelopathy or radiculopathy, lumbar region: Secondary | ICD-10-CM | POA: Diagnosis not present

## 2024-05-09 DIAGNOSIS — Z0000 Encounter for general adult medical examination without abnormal findings: Secondary | ICD-10-CM | POA: Diagnosis not present

## 2024-05-09 DIAGNOSIS — G4733 Obstructive sleep apnea (adult) (pediatric): Secondary | ICD-10-CM | POA: Diagnosis not present

## 2024-05-09 DIAGNOSIS — T466X5A Adverse effect of antihyperlipidemic and antiarteriosclerotic drugs, initial encounter: Secondary | ICD-10-CM | POA: Diagnosis not present

## 2024-05-09 DIAGNOSIS — E1151 Type 2 diabetes mellitus with diabetic peripheral angiopathy without gangrene: Secondary | ICD-10-CM | POA: Diagnosis not present

## 2024-05-09 DIAGNOSIS — R82998 Other abnormal findings in urine: Secondary | ICD-10-CM | POA: Diagnosis not present

## 2024-05-09 DIAGNOSIS — M5442 Lumbago with sciatica, left side: Secondary | ICD-10-CM | POA: Diagnosis not present

## 2024-05-09 DIAGNOSIS — E039 Hypothyroidism, unspecified: Secondary | ICD-10-CM | POA: Diagnosis not present

## 2024-05-09 DIAGNOSIS — I739 Peripheral vascular disease, unspecified: Secondary | ICD-10-CM | POA: Diagnosis not present

## 2024-05-09 DIAGNOSIS — S82891A Other fracture of right lower leg, initial encounter for closed fracture: Secondary | ICD-10-CM | POA: Diagnosis not present

## 2024-05-09 DIAGNOSIS — G720 Drug-induced myopathy: Secondary | ICD-10-CM | POA: Diagnosis not present

## 2024-05-09 DIAGNOSIS — I10 Essential (primary) hypertension: Secondary | ICD-10-CM | POA: Diagnosis not present

## 2024-05-09 DIAGNOSIS — M25512 Pain in left shoulder: Secondary | ICD-10-CM | POA: Diagnosis not present

## 2024-05-09 DIAGNOSIS — E1140 Type 2 diabetes mellitus with diabetic neuropathy, unspecified: Secondary | ICD-10-CM | POA: Diagnosis not present

## 2024-05-13 ENCOUNTER — Other Ambulatory Visit: Payer: Self-pay | Admitting: Internal Medicine

## 2024-05-13 DIAGNOSIS — Z1231 Encounter for screening mammogram for malignant neoplasm of breast: Secondary | ICD-10-CM

## 2024-05-15 DIAGNOSIS — M79662 Pain in left lower leg: Secondary | ICD-10-CM | POA: Diagnosis not present

## 2024-05-15 DIAGNOSIS — S80812A Abrasion, left lower leg, initial encounter: Secondary | ICD-10-CM | POA: Diagnosis not present

## 2024-05-21 ENCOUNTER — Other Ambulatory Visit: Payer: Self-pay | Admitting: Obstetrics and Gynecology

## 2024-06-13 ENCOUNTER — Other Ambulatory Visit: Payer: Self-pay | Admitting: Gastroenterology

## 2024-06-14 ENCOUNTER — Encounter: Payer: Self-pay | Admitting: Advanced Practice Midwife

## 2024-06-14 DIAGNOSIS — M47816 Spondylosis without myelopathy or radiculopathy, lumbar region: Secondary | ICD-10-CM | POA: Diagnosis not present

## 2024-06-14 NOTE — H&P (Signed)
-------------------------------------------------------------------------------   Attestation signed by Toribio Fairy Badder, MD at 06/14/2024  8:49 AM I saw and evaluated the patient, reviewed the fellow's note and updated it as appropriate. I agree with the fellow's findings and plan. The patient is appropriate for performance of the procedure in the ambulatory surgery setting.  Electronically Signed by: Toribio Badder, MD, Attending Physician 06/14/2024 8:49 AM -------------------------------------------------------------------------------  [facility]  [facility] Surgical History and Physical  PATIENT NAME: Natalie Bond PATIENT MRN: [id number] DATE OF SURGERY: 06/14/2024  Identification: Natalie Bond is a 68 y.o. year old female.  History of Present Illness Update from Previous Visit: Reviewed, no changes from last visit  Overall Pain: Unchanged from previous visit  Past Medical History: Medical History[1]  Past Surgical History: Surgical History[2]  Family History: Family History[3]  Social History: Reviewed in patient chart  Allergies: is allergic to hydromorphone , penicillins, atorvastatin  calcium , penicillin, and valsartan.  Review of Systems: No relevant updates.  Current Medications: See outpatient medication list in patient's chart.  Physical Exam: AFVSS Chest: unlabored breathing, equal chest expansion bilaterally, normal rate Cardiac: regular rate, no jugular venous distention or peripheral edema MSK/Neuro exam unchanged from previous visit  Diagnosis: Lumbar facet arthropathy   Impression Narrative: Appropriate for planned procedure. The patient is appropriate for performance of the procedure in the ambulatory surgery setting.  Patient Education - A description of the planned procedure was provided to the patient.  The risks and benefits of the procedure were discussed with the patient and all  questions were addressed to the patients satisfaction. The risks of the procedure include (but are not limited to) increased pain following the procedure, failure of procedure to alleviate pain, and the fading of local anesthetic effect among other risks as specified on the procedure consent formed signed by the patient and myself.  NPO guidelines were discussed with patient, if indicated.  Plan: Continue with planned procedure.  Physician Attestation: Seen by Bernardino Belvie Agreste, DO       [1] Past Medical History: Diagnosis Date  . Cancer    (CMD)    skin  . Hyperlipidemia   . Hypertension   [2] Past Surgical History: Procedure Laterality Date  . BACK SURGERY     Procedure: BACK SURGERY; x2  . CARPAL TUNNEL RELEASE     Procedure: CARPAL TUNNEL RELEASE; bilateral  . CATARACT EXTRACTION W/ INTRAOCULAR LENS  IMPLANT, BILATERAL     Procedure: CATARACT EXTRACTION W/ INTRAOCULAR LENS  IMPLANT, BILATERAL  . SHOULDER SURGERY     Procedure: SHOULDER SURGERY; left  [3] Family History Problem Relation Name Age of Onset  . Cancer Mother    . Hypertension Mother    . Heart disease Father died age 28   . Heart attack Father died age 89   . Heart attack Sister died age 74   . Heart disease Sister died age 33   . COPD Brother    . Cirrhosis Brother    . COPD Brother

## 2024-06-24 ENCOUNTER — Other Ambulatory Visit (INDEPENDENT_AMBULATORY_CARE_PROVIDER_SITE_OTHER): Payer: Self-pay

## 2024-06-24 ENCOUNTER — Encounter: Payer: Self-pay | Admitting: Orthopaedic Surgery

## 2024-06-24 ENCOUNTER — Ambulatory Visit: Payer: PPO | Admitting: Orthopaedic Surgery

## 2024-06-24 DIAGNOSIS — Z96641 Presence of right artificial hip joint: Secondary | ICD-10-CM | POA: Diagnosis not present

## 2024-06-24 NOTE — Progress Notes (Signed)
 The patient presents today at just over 7 months status post a right anterior total hip arthroplasty.  This was secondary to significant arthritis in her right hip.  We replaced her left hip back in 2017.  Back in May of this year we did see her for a right ankle injury due to a small avulsion fracture at the fibula.  This was minimal but was treated with a cam walking boot.  She does state the ankle is doing much better overall.  She is also says her hips are doing better.  She has been dealing with significant lumbar spine issues and sees someone else for this.  Apparently she had some type of procedure recently on the facet joints of her lumbar spine to the left side and has been very sensitive.  She states that the back specialist would like to do this on the right lower lumbar spine but she is going to likely defer this.  Both hips are moving smoothly and fluidly.  Her right ankle also moves smoothly with just a little bit of sensitivity of the fibula but this is minimal.  The ankle feels ligamentously stable.  Standing AP pelvis shows bilateral total hip arthroplasties that are bone ingrown with no complicating features.  At this point follow-up for hips can be as needed.  All questions and concerns were answered and addressed.  She does report a recent fall on her left knee and I let her know if that keeps bothering her over the next month do not hesitate to come in to be seen for her left knee.

## 2024-06-25 DIAGNOSIS — S80912A Unspecified superficial injury of left knee, initial encounter: Secondary | ICD-10-CM | POA: Diagnosis not present

## 2024-06-25 DIAGNOSIS — M25562 Pain in left knee: Secondary | ICD-10-CM | POA: Diagnosis not present

## 2024-06-26 ENCOUNTER — Telehealth: Payer: Self-pay | Admitting: Physical Medicine and Rehabilitation

## 2024-06-26 ENCOUNTER — Telehealth: Payer: Self-pay | Admitting: Orthopaedic Surgery

## 2024-06-26 ENCOUNTER — Other Ambulatory Visit: Payer: Self-pay | Admitting: Physical Medicine and Rehabilitation

## 2024-06-26 NOTE — Telephone Encounter (Signed)
 Pt called stating her leg has gotten worse and can barely stand on it and asking to be worked in for a sooner appt. Please cal pt about this matter at [phone].

## 2024-06-26 NOTE — Telephone Encounter (Signed)
Patient called. She would like an appointment with Dr. Newton.  

## 2024-06-27 DIAGNOSIS — M5416 Radiculopathy, lumbar region: Secondary | ICD-10-CM | POA: Diagnosis not present

## 2024-07-08 ENCOUNTER — Ambulatory Visit: Admitting: Orthopaedic Surgery

## 2024-07-08 ENCOUNTER — Other Ambulatory Visit (INDEPENDENT_AMBULATORY_CARE_PROVIDER_SITE_OTHER): Payer: Self-pay

## 2024-07-08 DIAGNOSIS — M25562 Pain in left knee: Secondary | ICD-10-CM

## 2024-07-08 DIAGNOSIS — M25561 Pain in right knee: Secondary | ICD-10-CM

## 2024-07-08 MED ORDER — LIDOCAINE HCL 1 % IJ SOLN
3.0000 mL | INTRAMUSCULAR | Status: AC | PRN
Start: 2024-07-08 — End: 2024-07-08
  Administered 2024-07-08 (×2): 3 mL

## 2024-07-08 MED ORDER — METHYLPREDNISOLONE ACETATE 40 MG/ML IJ SUSP
40.0000 mg | INTRAMUSCULAR | Status: AC | PRN
  Administered 2024-07-08 (×2): 40 mg via INTRA_ARTICULAR

## 2024-07-08 NOTE — Progress Notes (Signed)
 The patient is a 68 year old female who comes in with worsening bilateral knee pain after mechanical fall on her knees back around early July.  We actually saw her a few weeks ago as follow-up for both of her hips being replaced.  She says her left knee was hurting her but now both knees hurt and she says she was even almost tearful coming into the office today with a cane.  She is on meloxicam .  The hips are doing well.  Examination of both knees today showed no effusion.  Both knees have normal range of motion and are ligamentously stable but painful.  X-rays of both knees showed normal-appearing knees with well-maintained joint space no evidence of fracture with either knee.  The alignment is neutral with both knees.  I did give her reassurance that I do not see anything fractured with either knee.  I did recommend a steroid injection in both knees which she agreed to and tolerated well.  She is a diabetic but will watch her blood glucose.  The next time we need to see is not for 3 months unless she is having issues.  Will have a standing AP pelvis at that visit.  If her knees continue to worsen she will give us  a call because we would then obtain an MRI of her knees.    Procedure Note  [name]             Date of Birth: [birth year]           MRN: [id number]             Visit Date: 07/08/2024  Procedures: Visit Diagnoses:  1. Acute pain of left knee   2. Acute pain of right knee     Large Joint Inj: R knee on 07/08/2024 2:38 PM Indications: diagnostic evaluation and pain Details: 22 G 1.5 in needle, superolateral approach  Arthrogram: No  Medications: 3 mL lidocaine  1 %; 40 mg methylPREDNISolone  acetate 40 MG/ML Outcome: tolerated well, no immediate complications Procedure, treatment alternatives, risks and benefits explained, specific risks discussed. Consent was given by the patient. Immediately prior to procedure a time out was called to verify the correct patient,  procedure, equipment, support staff and site/side marked as required. Patient was prepped and draped in the usual sterile fashion.    Large Joint Inj: L knee on 07/08/2024 2:38 PM Indications: diagnostic evaluation and pain Details: 22 G 1.5 in needle, superolateral approach  Arthrogram: No  Medications: 3 mL lidocaine  1 %; 40 mg methylPREDNISolone  acetate 40 MG/ML Outcome: tolerated well, no immediate complications Procedure, treatment alternatives, risks and benefits explained, specific risks discussed. Consent was given by the patient. Immediately prior to procedure a time out was called to verify the correct patient, procedure, equipment, support staff and site/side marked as required. Patient was prepped and draped in the usual sterile fashion.

## 2024-07-14 ENCOUNTER — Other Ambulatory Visit: Payer: Self-pay | Admitting: Gastroenterology

## 2024-07-15 ENCOUNTER — Encounter: Payer: Self-pay | Admitting: Physical Medicine and Rehabilitation

## 2024-07-15 ENCOUNTER — Ambulatory Visit: Admitting: Physical Medicine and Rehabilitation

## 2024-07-15 ENCOUNTER — Telehealth: Payer: Self-pay | Admitting: Gastroenterology

## 2024-07-15 DIAGNOSIS — G8929 Other chronic pain: Secondary | ICD-10-CM | POA: Diagnosis not present

## 2024-07-15 DIAGNOSIS — M5442 Lumbago with sciatica, left side: Secondary | ICD-10-CM

## 2024-07-15 DIAGNOSIS — M5416 Radiculopathy, lumbar region: Secondary | ICD-10-CM

## 2024-07-15 DIAGNOSIS — M5441 Lumbago with sciatica, right side: Secondary | ICD-10-CM

## 2024-07-15 NOTE — Telephone Encounter (Signed)
 Inbound call from patient requesting a refill for famotidine . Please advise, thank you

## 2024-07-15 NOTE — Progress Notes (Signed)
 Pain Scale   Average Pain 10 Patient advising she has chronic lower back pain radiating to bilateral hips and legs        +Driver, -BT, -Dye Allergies.

## 2024-07-15 NOTE — Progress Notes (Signed)
 Natalie Bond - 68 y.o. female MRN [id number]  Date of birth: 01-Jun-1956  Office Visit Note: Visit Date: 07/15/2024 PCP: Yolande Toribio MATSU, MD Referred by: Yolande Toribio MATSU, MD  Subjective: Chief Complaint  Patient presents with   Lower Back - Pain   HPI: Natalie Bond is a 68 y.o. female who comes in today as a self referral for evaluation of chronic, worsening and severe bilateral lower back pain radiating down lateral legs to feet, left greater than right. Natalie Bond is well known to us . She was last seen in our office in 2024, most recently treated by Dr. Toribio Badder with [facility]. Her pain worsens with prolonged sitting and standing, also reports severe pain with activity. She describes pain as sharp and shooting sensation, currently rates as 7 out of 10. Some relief of pain with home exercise regimen, rest and use of medications. She does take Gabapentin  daily, also reports good relief of pain with Ibuprofen . Recent CT of lumbar spine shows moderate facet joint arthropathy bilaterally from L3-S1. There is moderate spinal canal stenosis at L4-L5. She recently underwent left L4-L5 and L5-S1 radiofrequency ablation with Dr. Badder on 06/14/2024. She reports increased pain 3 days post procedure, states she was unable to walk post ablation. She has followed up with Dr. Badder since procedure, can further review these notes in Care Everywhere. Patient denies focal weakness, numbness and tingling. No recent trauma or falls. She is ambulatory today without assistance.      Review of Systems  Musculoskeletal:  Positive for back pain and myalgias.  Neurological:  Negative for tingling, sensory change, focal weakness and weakness.  All other systems reviewed and are negative.  Otherwise per HPI.  Assessment & Plan: Visit Diagnoses:    ICD-10-CM   1. Chronic bilateral low back pain with bilateral sciatica  M54.42 Ambulatory referral to Physical Medicine Rehab   M54.41    G89.29      2. Lumbar radiculopathy  M54.16 Ambulatory referral to Physical Medicine Rehab       Plan: Findings:  Chronic, worsening and severe bilateral lower back pain radiating down lateral legs to feet, left greater than right. Patient continues to have severe pain despite good conservative therapies such as home exercise regimen, rest and use of medications. Patients clinical presentation and exam are consistent with lumbar radiculopathy. I discussed recent CT of lumbar spine today, there is moderate spinal canal stenosis at L4-L5, however I am unable to view images. We discussed treatment plan in detail today. Next step is to perform diagnostic and hopefully therapeutic bilateral L4 transforaminal epidural steroid injection under fluoroscopic guidance. If good relief of pain with injection we can repeat this procedure infrequently as needed. Should her pain persist post injection would consider obtaining new lumbar MRI imaging. I am happy to refill Gabapentin  for her as needed. Her exam today is difficult, likely due to pain and decreased effort. Good strength noted to bilateral lower extremities. No red flag symptoms noted upon exam today.     Meds & Orders: No orders of the defined types were placed in this encounter.   Orders Placed This Encounter  Procedures   Ambulatory referral to Physical Medicine Rehab    Follow-up: Return for Bilateral L4 transforaminal epidural steroid injection.   Procedures: No procedures performed      Clinical History: CT LUMBAR SPINE WITHOUT CONTRAST, 06/27/2024 9:29 AM  INDICATION: Acute onset left radicular leg pain a week following lumbar RFA, eval fluid collection  over left L4/L5, L5/S1 levels or evidence of foraminal narrowing or disc herniation affecting left leg, Radiculopathy, lumbar region \ M54.16 Radiculopathy, lumbar region  COMPARISON: CT lumbar spine 07/18/2003  TECHNIQUE: Thin-section axial CT images of the entire lumbar spine were acquired  without contrast. Supplemental 2D reformatted images were generated and reviewed as needed.  All CT scans at [facility] and [address] [facility] are performed using radiation dose optimization techniques as appropriate to a performed exam, including but not limited to one or more of the following: automatic exposure control, adjustment of the mA and/or kV according to patient size, use of iterative reconstruction technique. In addition, our institution participates in a radiation dose monitoring program to optimize patient radiation exposure.  LEVELS IMAGED: Lower thoracic region to upper sacrum.   FINDINGS:  Alignment: No acute traumatic malalignment. Mild levocurvature of the lumbar spine.  Vertebrae: No acute fractures. Vertebral body heights maintained.  Degenerative changes: No high-grade canal stenosis. Mild canal stenosis at L3-L4 and moderate spinal canal stenosis at L4-L5. Vacuum disc phenomenon at L5-S1. Moderate facet joint arthropathy bilaterally from L3-S1. Mild to moderate neural foraminal stenosis at L5-S1 on the left.  IMPRESSION:  1.  No high grade spinal canal or neural foraminal stenosis. Consider MRI for further evaluation as clinically warrented. 2.  No fluid collections identified. Exam End: 06/27/24 09:29   She reports that she has never smoked. She has never used smokeless tobacco.  Recent Labs    11/09/23 0859  HGBA1C 6.2*    Objective:  VS:  HT:    WT:   BMI:     BP:   HR: bpm  TEMP: ( )  RESP:  Physical Exam Vitals and nursing note reviewed.  HENT:     Head: Normocephalic and atraumatic.     Right Ear: External ear normal.     Left Ear: External ear normal.     Nose: Nose normal.     Mouth/Throat:     Mouth: Mucous membranes are moist.  Eyes:     Extraocular Movements: Extraocular movements intact.  Cardiovascular:     Rate and Rhythm: Normal rate.     Pulses: Normal pulses.  Pulmonary:     Effort: Pulmonary  effort is normal.  Abdominal:     General: Abdomen is flat. There is no distension.  Musculoskeletal:        General: Tenderness present.     Cervical back: Normal range of motion.     Comments: Today's exam difficult likely due to pain and decreased effort. Patient is slow to rise from seated position to standing. Good lumbar range of motion. No pain noted with facet loading. 5/5 strength noted with bilateral hip flexion, knee flexion/extension, ankle dorsiflexion/plantarflexion and EHL. No clonus noted bilaterally. No pain upon palpation of greater trochanters. No pain with internal/external rotation of bilateral hips. Sensation intact bilaterally. Myofascial tenderness noted upon palpation of bilateral lumbar paraspinal regions. Negative slump test bilaterally. Ambulates without aid, gait steady.     Skin:    General: Skin is warm and dry.     Capillary Refill: Capillary refill takes less than 2 seconds.  Neurological:     General: No focal deficit present.     Mental Status: She is alert and oriented to person, place, and time.  Psychiatric:        Mood and Affect: Mood normal.        Behavior: Behavior normal.     Ortho Exam  Imaging: No results found.  Past Medical/Family/Surgical/Social History: Medications & Allergies reviewed per EMR, new medications updated. Patient Active Problem List   Diagnosis Date Noted   OA (osteoarthritis) of hip 11/14/2023   Status post total replacement of right hip 11/14/2023   Family history of colon cancer 11/02/2023   History of cholecystectomy 11/02/2023   Diarrhea 11/02/2023   Nausea 11/02/2023   Dysphagia 11/02/2023   Gastroesophageal reflux disease 11/02/2023   Toxic multinodular goiter 02/02/2023   Goiter, toxic, multinodular 01/28/2023   hyperthyroidism 03/17/2022   Cholangiectasis 03/17/2022   Esophageal dysphagia 03/17/2022   Fecal urgency 03/17/2022   Hyperglycemia 03/17/2022   Hypomagnesemia 03/17/2022   Neck pain  03/17/2022   Salivary calculus 03/17/2022   Spasm 03/17/2022   Hyperthyroidism 03/17/2022   Primary osteoarthritis of first carpometacarpal joint of left hand 12/24/2019   Vomiting 08/28/2019   Diabetic peripheral neuropathy associated with type 2 diabetes mellitus (HCC) 06/06/2019   Hemiplegia (HCC) 06/06/2019   TIA (transient ischemic attack) 05/29/2019   Anterolisthesis 05/29/2019   Thyroid  nodule 05/29/2019   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   Right arm weakness 05/27/2019   Type 2 diabetes mellitus without complication (HCC) 05/27/2019   Arthritis of carpometacarpal (CMC) joint of right thumb 04/16/2019   Generalized pruritus 02/27/2019   Rash and other nonspecific skin eruption 02/27/2019   Dysuria 01/03/2019   Localized edema 10/22/2018   Shoulder joint pain 08/28/2018   Mixed conductive and sensorineural hearing loss of left ear with restricted hearing of right ear 04/26/2018   Vertigo 04/26/2018   Acute serous otitis media of left ear 04/26/2018   Referred otalgia of left ear 04/26/2018   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   Venous stasis dermatitis of left lower extremity 04/09/2018   Achilles tendon contracture, left 04/09/2018   Pain in left ankle and joints of left foot 04/09/2018   Cough 04/09/2018   Otitis media 04/09/2018   Primary osteoarthritis of first carpometacarpal joint of right hand 10/09/2017   Bilateral hand pain 10/05/2017   Primary osteoarthritis of both first carpometacarpal joints 10/05/2017   Trochanteric bursitis, right hip 06/29/2017   Degenerative disc disease at L5-S1 level 05/04/2017   History of joint replacement 05/04/2017   Claw toe, acquired, right 04/27/2017   Achilles tendon contracture, right 04/27/2017   Pain in right foot 04/11/2017   Status post left hip replacement 10/21/2016   Chronic back pain 01/14/2016   Migraine 01/14/2016   Hematuria 03/27/2015   Shortness of breath  03/27/2015   Essential hypertension 01/22/2015   Obstructive sleep apnea (adult) (pediatric) 10/30/2014   Psychophysiologic insomnia 04/04/2014   Gastro-esophageal reflux disease without esophagitis 09/12/2013   Obesity 12/02/2011   Disequilibrium 11/10/2009   Enterocolitis due to Clostridium difficile 11/10/2009   Family history of diabetes mellitus 11/10/2009   Hyperlipidemia 11/10/2009   Meniere disease 11/10/2009   Headache 11/10/2009   Perforation of tympanic membrane 11/10/2009   Soft tissue lesion of shoulder region 11/10/2009   Past Medical History:  Diagnosis Date   Anxiety    Arthritis    Arthritis    back, wrists   Cancer (HCC)    skin cancer   Carpal tunnel syndrome    Complication of anesthesia    DDD (degenerative disc disease)    Diabetes mellitus without complication (HCC)    does not take meds   Dysrhythmia 1995   irreg hr   Full dentures    GERD (gastroesophageal reflux disease)  Headache    High cholesterol    History of bronchitis    History of hiatal hernia    History of kidney stones    2015   Hypertension    Hyperthyroidism    PONV (postoperative nausea and vomiting)    Sleep apnea    pt. does not use a CPAP or BiPAP   Stroke (HCC)    Pt. states possible mini stroke in 2014, no deficits   Vertigo    Family History  Problem Relation Age of Onset   Cancer Mother    Diabetes Mother    Heart disease Mother    Hyperlipidemia Mother    Hypertension Mother    Cancer Father    Diabetes Father    Heart disease Father    Hyperlipidemia Father    Hypertension Father    Varicose Veins Father    Colon cancer Sister    Hypertension Sister    Cancer Sister        colon   Heart disease Sister    Hyperlipidemia Sister    Hypertension Brother    Cancer Brother    Diabetes Brother    Hyperlipidemia Brother    Cancer Brother        pancreatic   Esophageal cancer Neg Hx    Inflammatory bowel disease Neg Hx    Liver disease Neg Hx     Prostate cancer Neg Hx    Stomach cancer Neg Hx    Past Surgical History:  Procedure Laterality Date   ABDOMINAL HYSTERECTOMY     BACK SURGERY  11/28/2002   [id number]   BACK SURGERY     CARDIAC CATHETERIZATION     01/27/15 [facility]): EF 60%, normal coronaries.   CARPAL TUNNEL RELEASE  09/20/2012   Procedure: CARPAL TUNNEL RELEASE;  Surgeon: Lamar LULLA Leonor Mickey., MD;  Location: [facility];  Service: Orthopedics;  Laterality: Left;   CARPOMETACARPEL SUSPENSION PLASTY Right 12/12/2018   Procedure: RIGHT THUMB CARPOMETACARPAL (CMC) ARTHROPLASTY;  Surgeon: Jerri Kay HERO, MD;  Location: [facility];  Service: Orthopedics;  Laterality: Right;   CARPOMETACARPEL SUSPENSION PLASTY Left 12/30/2019   Procedure: LEFT THUMB CARPOMETACARPAL (CMC) ARTHROPLASTY;  Surgeon: Jerri Kay HERO, MD;  Location: [facility];  Service: Orthopedics;  Laterality: Left;   CATARACT EXTRACTION, BILATERAL     CHOLECYSTECTOMY  07/2016   done [place]   DORSAL COMPARTMENT RELEASE  09/20/2012   Procedure: RELEASE DORSAL COMPARTMENT (DEQUERVAIN);  Surgeon: Lamar LULLA Leonor Mickey., MD;  Location: [facility];  Service: Orthopedics;  Laterality: Left;   EYE SURGERY     bilat cataracts   EYE SURGERY Right    torn retina repaired   HERNIA REPAIR Left 1990   left groin    LOWER EXTREMITY ANGIOGRAM Left 06/08/2015   Procedure: Ascending Venogram  Iliac Vein ;  Surgeon: Carlin FORBES Haddock, MD;  Location: [facility] OR;  Service: Vascular;  Laterality: Left;  ;  TARA- [facility] / ZACH-[facility] HAVE BEEN NOTIFIED/ CONFIRMED   PARTIAL HYSTERECTOMY     SHOULDER SURGERY Left    THYROIDECTOMY N/A 02/02/2023   Procedure: TOTAL THYROIDECTOMY;  Surgeon: Eletha Boas, MD;  Location: [facility];  Service: General;  Laterality: N/A;   TOTAL HIP ARTHROPLASTY Left 10/21/2016   Procedure: LEFT TOTAL HIP ARTHROPLASTY ANTERIOR APPROACH;  Surgeon: Lonni CINDERELLA Poli, MD;  Location: [facility];   Service: Orthopedics;  Laterality: Left;   TOTAL HIP ARTHROPLASTY Right 11/14/2023   Procedure:  RIGHT TOTAL HIP ARTHROPLASTY ANTERIOR APPROACH;  Surgeon: Vernetta Lonni GRADE, MD;  Location: [facility] OR;  Service: Orthopedics;  Laterality: Right;   WRIST SURGERY Bilateral    carpal tunnel   Social History   Occupational History   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

## 2024-07-15 NOTE — Telephone Encounter (Signed)
 Refill for famotidine  has been sent to pharmacy.

## 2024-08-06 ENCOUNTER — Other Ambulatory Visit: Payer: Self-pay | Admitting: Gastroenterology

## 2024-08-12 ENCOUNTER — Other Ambulatory Visit: Payer: Self-pay

## 2024-08-12 ENCOUNTER — Ambulatory Visit: Admitting: Physical Medicine and Rehabilitation

## 2024-08-12 VITALS — BP 183/91 | HR 57

## 2024-08-12 DIAGNOSIS — M5416 Radiculopathy, lumbar region: Secondary | ICD-10-CM

## 2024-08-12 MED ORDER — METHYLPREDNISOLONE ACETATE 40 MG/ML IJ SUSP
40.0000 mg | Freq: Once | INTRAMUSCULAR | Status: AC
  Administered 2024-08-12: 40 mg

## 2024-08-12 NOTE — Progress Notes (Unsigned)
 Pain Scale   Average Pain 10 Patient advised that she had lower back pain radiaing to left leg and at times to right leg, patient advising her pain is constant. Patient advising her b/p was elevated today due to nerves and she just took her medication        +Driver, -BT, -Dye Allergies.

## 2024-08-13 NOTE — Progress Notes (Signed)
 Natalie Bond - 68 y.o. female MRN [id number]  Date of birth: 1955-11-29  Office Visit Note: Visit Date: 08/12/2024 PCP: Yolande Toribio MATSU, MD Referred by: Yolande Toribio MATSU, MD  Subjective: Chief Complaint  Patient presents with   Lower Back - Pain   HPI:  Natalie Bond is a 68 y.o. female who comes in today at the request of Duwaine Pouch, FNP for planned Bilateral L4-5 Lumbar Transforaminal epidural steroid injection with fluoroscopic guidance.  The patient has failed conservative care including home exercise, medications, time and activity modification.  This injection will be diagnostic and hopefully therapeutic.  Please see requesting physician notes for further details and justification.   ROS Otherwise per HPI.  Assessment & Plan: Visit Diagnoses:    ICD-10-CM   1. Lumbar radiculopathy  M54.16 XR C-ARM NO REPORT    Epidural Steroid injection    methylPREDNISolone  acetate (DEPO-MEDROL ) injection 40 mg      Plan: No additional findings.   Meds & Orders:  Meds ordered this encounter  Medications   methylPREDNISolone  acetate (DEPO-MEDROL ) injection 40 mg    Orders Placed This Encounter  Procedures   XR C-ARM NO REPORT   Epidural Steroid injection    Follow-up: Return for visit to requesting provider as needed.   Procedures: No procedures performed  Lumbosacral Transforaminal Epidural Steroid Injection - Sub-Pedicular Approach with Fluoroscopic Guidance  [name]      Date of Birth: 1956-01-17 MRN: [id number] PCP: Yolande Toribio MATSU, MD      Visit Date: 08/12/2024   Universal Protocol:    Date/Time: 08/12/2024  Consent Given By: the patient  Position: PRONE  Additional Comments: Vital signs were monitored before and after the procedure. Patient was prepped and draped in the usual sterile fashion. The correct patient, procedure, and site was verified.   Injection Procedure Details:   Procedure diagnoses: Lumbar radiculopathy  [M54.16]    Meds Administered:  Meds ordered this encounter  Medications   methylPREDNISolone  acetate (DEPO-MEDROL ) injection 40 mg    Laterality: Bilateral  Location/Site: L4  Needle:5.0 in., 22 ga.  Short bevel or Quincke spinal needle  Needle Placement: Transforaminal  Findings:    -Comments: Excellent flow of contrast along the nerve, nerve root and into the epidural space.  Procedure Details: After squaring off the end-plates to get a true AP view, the C-arm was positioned so that an oblique view of the foramen as noted above was visualized. The target area is just inferior to the nose of the scotty dog or sub pedicular. The soft tissues overlying this structure were infiltrated with 2-3 ml. of 1% Lidocaine  without Epinephrine .  The spinal needle was inserted toward the target using a trajectory view along the fluoroscope beam.  Under AP and lateral visualization, the needle was advanced so it did not puncture dura and was located close the 6 O'Clock position of the pedical in AP tracterory. Biplanar projections were used to confirm position. Aspiration was confirmed to be negative for CSF and/or blood. A 1-2 ml. volume of Isovue-250 was injected and flow of contrast was noted at each level. Radiographs were obtained for documentation purposes.   After attaining the desired flow of contrast documented above, a 0.5 to 1.0 ml test dose of 0.25% Marcaine  was injected into each respective transforaminal space.  The patient was observed for 90 seconds post injection.  After no sensory deficits were reported, and normal lower extremity motor function was noted,   the above injectate  was administered so that equal amounts of the injectate were placed at each foramen (level) into the transforaminal epidural space.   Additional Comments:  The patient tolerated the procedure well Dressing: 2 x 2 sterile gauze and Band-Aid    Post-procedure details: Patient was observed during the  procedure. Post-procedure instructions were reviewed.  Patient left the clinic in stable condition.    Clinical History: CT LUMBAR SPINE WITHOUT CONTRAST, 06/27/2024 9:29 AM  INDICATION: Acute onset left radicular leg pain a week following lumbar RFA, eval fluid collection over left L4/L5, L5/S1 levels or evidence of foraminal narrowing or disc herniation affecting left leg, Radiculopathy, lumbar region \ M54.16 Radiculopathy, lumbar region  COMPARISON: CT lumbar spine 07/18/2003  TECHNIQUE: Thin-section axial CT images of the entire lumbar spine were acquired without contrast. Supplemental 2D reformatted images were generated and reviewed as needed.  All CT scans at [facility] and [facility] are performed using radiation dose optimization techniques as appropriate to a performed exam, including but not limited to one or more of the following: automatic exposure control, adjustment of the mA and/or kV according to patient size, use of iterative reconstruction technique. In addition, our institution participates in a radiation dose monitoring program to optimize patient radiation exposure.  LEVELS IMAGED: Lower thoracic region to upper sacrum.   FINDINGS:  Alignment: No acute traumatic malalignment. Mild levocurvature of the lumbar spine.  Vertebrae: No acute fractures. Vertebral body heights maintained.  Degenerative changes: No high-grade canal stenosis. Mild canal stenosis at L3-L4 and moderate spinal canal stenosis at L4-L5. Vacuum disc phenomenon at L5-S1. Moderate facet joint arthropathy bilaterally from L3-S1. Mild to moderate neural foraminal stenosis at L5-S1 on the left.  IMPRESSION:  1.  No high grade spinal canal or neural foraminal stenosis. Consider MRI for further evaluation as clinically warrented. 2.  No fluid collections identified. Exam End: 06/27/24 09:29     Objective:  VS:  HT:    WT:   BMI:     BP:(!) 183/91  HR:(!)  57bpm  TEMP: ( )  RESP:  Physical Exam Vitals and nursing note reviewed.  Constitutional:      General: She is not in acute distress.    Appearance: Normal appearance. She is not ill-appearing.  HENT:     Head: Normocephalic and atraumatic.     Right Ear: External ear normal.     Left Ear: External ear normal.  Eyes:     Extraocular Movements: Extraocular movements intact.  Cardiovascular:     Rate and Rhythm: Normal rate.     Pulses: Normal pulses.  Pulmonary:     Effort: Pulmonary effort is normal. No respiratory distress.  Abdominal:     General: There is no distension.     Palpations: Abdomen is soft.  Musculoskeletal:        General: Tenderness present.     Cervical back: Neck supple.     Right lower leg: No edema.     Left lower leg: No edema.     Comments: Patient has good distal strength with no pain over the greater trochanters.  No clonus or focal weakness.  Skin:    Findings: No erythema, lesion or rash.  Neurological:     General: No focal deficit present.     Mental Status: She is alert and oriented to person, place, and time.     Sensory: No sensory deficit.     Motor: No weakness or abnormal muscle tone.  Coordination: Coordination normal.  Psychiatric:        Mood and Affect: Mood normal.        Behavior: Behavior normal.      Imaging: No results found.

## 2024-08-13 NOTE — Procedures (Signed)
 Lumbosacral Transforaminal Epidural Steroid Injection - Sub-Pedicular Approach with Fluoroscopic Guidance  [name]      Date of Birth: 12-29-1955 MRN: [id number] PCP: Yolande Toribio MATSU, MD      Visit Date: 08/12/2024   Universal Protocol:    Date/Time: 08/12/2024  Consent Given By: the patient  Position: PRONE  Additional Comments: Vital signs were monitored before and after the procedure. Patient was prepped and draped in the usual sterile fashion. The correct patient, procedure, and site was verified.   Injection Procedure Details:   Procedure diagnoses: Lumbar radiculopathy [M54.16]    Meds Administered:  Meds ordered this encounter  Medications   methylPREDNISolone  acetate (DEPO-MEDROL ) injection 40 mg    Laterality: Bilateral  Location/Site: L4  Needle:5.0 in., 22 ga.  Short bevel or Quincke spinal needle  Needle Placement: Transforaminal  Findings:    -Comments: Excellent flow of contrast along the nerve, nerve root and into the epidural space.  Procedure Details: After squaring off the end-plates to get a true AP view, the C-arm was positioned so that an oblique view of the foramen as noted above was visualized. The target area is just inferior to the nose of the scotty dog or sub pedicular. The soft tissues overlying this structure were infiltrated with 2-3 ml. of 1% Lidocaine  without Epinephrine .  The spinal needle was inserted toward the target using a trajectory view along the fluoroscope beam.  Under AP and lateral visualization, the needle was advanced so it did not puncture dura and was located close the 6 O'Clock position of the pedical in AP tracterory. Biplanar projections were used to confirm position. Aspiration was confirmed to be negative for CSF and/or blood. A 1-2 ml. volume of Isovue-250 was injected and flow of contrast was noted at each level. Radiographs were obtained for documentation purposes.   After attaining the  desired flow of contrast documented above, a 0.5 to 1.0 ml test dose of 0.25% Marcaine  was injected into each respective transforaminal space.  The patient was observed for 90 seconds post injection.  After no sensory deficits were reported, and normal lower extremity motor function was noted,   the above injectate was administered so that equal amounts of the injectate were placed at each foramen (level) into the transforaminal epidural space.   Additional Comments:  The patient tolerated the procedure well Dressing: 2 x 2 sterile gauze and Band-Aid    Post-procedure details: Patient was observed during the procedure. Post-procedure instructions were reviewed.  Patient left the clinic in stable condition.

## 2024-08-21 ENCOUNTER — Ambulatory Visit: Admitting: Orthopaedic Surgery

## 2024-08-21 ENCOUNTER — Encounter: Payer: Self-pay | Admitting: Orthopaedic Surgery

## 2024-08-21 DIAGNOSIS — M25562 Pain in left knee: Secondary | ICD-10-CM

## 2024-08-21 DIAGNOSIS — M25561 Pain in right knee: Secondary | ICD-10-CM

## 2024-08-21 NOTE — Progress Notes (Signed)
 The patient comes in today for her knees again.  She is 68 years old and had a hard mechanical fall back in July.  Since then she continues to have locking catching of both knees and swelling and knots around both knees.  It is detrimentally affecting her mobility, her quality of life and her actives daily living.  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  On exam she is still very stiff with both knees.  We have placed steroid injection in both knees and previous x-rays showed well-maintained joint space of both knees.  There is fullness at the superior lateral aspect of both knees but no instability ligamentously on exam but she has significant pain with rotating both knees with a slight effusion.  There is pain throughout the flexion extension arc and she is walk with a limp favoring both knees.  At this point she has tried and failed conservative treatment for several months now including activity modification, steroid injections, anti-inflammatories, rest of the exercises.  Given her continued mechanical symptoms and the knots with her knees, a MRI is warranted of both knees to rule out any type of internal derangement that is causing the severity of her pain.  Prior to this fall in July she had had no issues with either knee.  Will see her back in follow-up once we have an MRI of both knees.

## 2024-08-22 ENCOUNTER — Other Ambulatory Visit: Payer: Self-pay

## 2024-08-22 DIAGNOSIS — M25561 Pain in right knee: Secondary | ICD-10-CM

## 2024-08-22 DIAGNOSIS — M25562 Pain in left knee: Secondary | ICD-10-CM

## 2024-09-02 ENCOUNTER — Other Ambulatory Visit

## 2024-09-08 ENCOUNTER — Other Ambulatory Visit: Payer: Self-pay | Admitting: Gastroenterology

## 2024-09-13 ENCOUNTER — Other Ambulatory Visit (HOSPITAL_COMMUNITY): Payer: Self-pay | Admitting: Internal Medicine

## 2024-09-13 ENCOUNTER — Ambulatory Visit
Admission: RE | Admit: 2024-09-13 | Discharge: 2024-09-13 | Disposition: A | Discharge status: Home / Self Care | Source: Ambulatory Visit | Attending: Orthopaedic Surgery | Admitting: Orthopaedic Surgery

## 2024-09-13 ENCOUNTER — Ambulatory Visit (HOSPITAL_COMMUNITY)
Admission: RE | Admit: 2024-09-13 | Discharge: 2024-09-13 | Disposition: A | Discharge status: Home / Self Care | Source: Ambulatory Visit | Attending: Vascular Surgery | Admitting: Vascular Surgery

## 2024-09-13 ENCOUNTER — Ambulatory Visit
Admission: RE | Admit: 2024-09-13 | Discharge: 2024-09-13 | Disposition: A | Source: Ambulatory Visit | Attending: Orthopaedic Surgery | Admitting: Orthopaedic Surgery

## 2024-09-13 DIAGNOSIS — M25562 Pain in left knee: Secondary | ICD-10-CM

## 2024-09-13 DIAGNOSIS — I739 Peripheral vascular disease, unspecified: Secondary | ICD-10-CM | POA: Diagnosis not present

## 2024-09-13 DIAGNOSIS — M25561 Pain in right knee: Secondary | ICD-10-CM

## 2024-09-13 LAB — VAS US ABI WITH/WO TBI
Left ABI: 1.03
Right ABI: 1.16

## 2024-09-17 ENCOUNTER — Ambulatory Visit
Admission: RE | Admit: 2024-09-17 | Discharge: 2024-09-17 | Disposition: A | Discharge status: Home / Self Care | Source: Ambulatory Visit | Attending: Orthopaedic Surgery | Admitting: Orthopaedic Surgery

## 2024-09-17 ENCOUNTER — Telehealth: Payer: Self-pay | Admitting: Physical Medicine and Rehabilitation

## 2024-09-17 DIAGNOSIS — M23322 Other meniscus derangements, posterior horn of medial meniscus, left knee: Secondary | ICD-10-CM | POA: Diagnosis not present

## 2024-09-17 NOTE — Telephone Encounter (Signed)
 Pt called asking for a call from [facility] or [facility] about knee pains. Please call pt at [phone]

## 2024-09-19 ENCOUNTER — Telehealth: Payer: Self-pay | Admitting: Physical Medicine and Rehabilitation

## 2024-09-19 ENCOUNTER — Other Ambulatory Visit: Payer: Self-pay | Admitting: Gastroenterology

## 2024-09-19 NOTE — Telephone Encounter (Signed)
 Pt request an appointment for another back injection

## 2024-09-23 ENCOUNTER — Telehealth: Payer: Self-pay | Admitting: Physical Medicine and Rehabilitation

## 2024-09-23 NOTE — Telephone Encounter (Signed)
 Pt asked if Duwaine has any cancellation today to please call her. Her back and legs are in severe pain. Pt phone number is [phone].

## 2024-09-27 ENCOUNTER — Encounter: Payer: Self-pay | Admitting: Physical Medicine and Rehabilitation

## 2024-09-27 ENCOUNTER — Ambulatory Visit (INDEPENDENT_AMBULATORY_CARE_PROVIDER_SITE_OTHER): Admitting: Physical Medicine and Rehabilitation

## 2024-09-27 DIAGNOSIS — M5416 Radiculopathy, lumbar region: Secondary | ICD-10-CM

## 2024-09-27 DIAGNOSIS — M961 Postlaminectomy syndrome, not elsewhere classified: Secondary | ICD-10-CM

## 2024-09-27 DIAGNOSIS — G8929 Other chronic pain: Secondary | ICD-10-CM

## 2024-09-27 DIAGNOSIS — M5441 Lumbago with sciatica, right side: Secondary | ICD-10-CM

## 2024-09-27 DIAGNOSIS — G894 Chronic pain syndrome: Secondary | ICD-10-CM | POA: Diagnosis not present

## 2024-09-27 DIAGNOSIS — M5442 Lumbago with sciatica, left side: Secondary | ICD-10-CM | POA: Diagnosis not present

## 2024-09-27 NOTE — Progress Notes (Signed)
 Pain comes in for low back pain. Had an ESI L-spine on 08/12/2024. Helped a little bit. Pain came back 1 week after injection. Having burning and tingling. Pain radiates down legs. Does not want any back surgeries.  Takes Ibuprofen  and Tylenol  Arthritis for pain.  Pain Scale   Average Pain 10

## 2024-09-27 NOTE — Progress Notes (Signed)
 Natalie Bond - 68 y.o. female MRN [id number]  Date of birth: [birth year]  Office Visit Note: Visit Date: 09/27/2024 PCP: Yolande Toribio MATSU, MD Referred by: Yolande Toribio MATSU, MD  Subjective: Chief Complaint  Patient presents with   Lower Back - Pain   HPI: Natalie Bond is a 68 y.o. female who comes in today for chronic, worsening and severe bilateral lower back pain radiating down lateral legs to feet, left greater than right. Natalie Bond is well known to us . History of left L4-L5 and L5-S1 radiofrequency ablation with Dr. Catherene on 06/14/2024. She reports increased pain 3 days post procedure, states she was unable to walk post ablation. She has followed up with Dr. Catherene since procedure. She was offered referral to surgeon, however she is very adamant about avoiding surgical intervention. She is tearful in the office today. Her pain worsens with prolonged sitting and standing, also reports severe pain with activity. She describes pain as sharp and shooting sensation, currently rates as 9 out of 10. Some relief of pain with home exercise regimen, rest and use of medications. She does take Gabapentin  and meloxicam  daily. Recent CT of lumbar spine shows moderate facet joint arthropathy bilaterally from L3-S1. There is moderate spinal canal stenosis at L4-L5. She has history of (2) lumbar surgeries in 2004. She underwent bilateral L4 transforaminal epidural steroid injection in our office on 08/12/2024. She reports minimal relief of pain with this procedure for about 1 week. Patient denies focal weakness, numbness and tingling. No recent trauma or falls.     Review of Systems  Musculoskeletal:  Positive for back pain and myalgias.  Neurological:  Negative for tingling, sensory change, focal weakness and weakness.  All other systems reviewed and are negative.  Otherwise per HPI.  Assessment & Plan: Visit Diagnoses:    ICD-10-CM   1. Chronic bilateral low back pain with bilateral sciatica   M54.42 Ambulatory referral to Physical Medicine Rehab   M54.41    G89.29     2. Lumbar radiculopathy  M54.16 Ambulatory referral to Physical Medicine Rehab    3. Post laminectomy syndrome  M96.1 Ambulatory referral to Physical Medicine Rehab    4. Chronic pain syndrome  G89.4 Ambulatory referral to Physical Medicine Rehab       Plan: Findings:  Chronic, worsening and severe bilateral lower back pain radiating down lateral legs to feet, left greater than right. Patient continues to have severe pain despite good conservative therapies such as home exercise regimen, rest and use of medications. Patients clinical presentation and exam are consistent with lumbar radiculopathy. There is moderate spinal canal stenosis at L4-L5 on prior CT of lumbar spine. Minimal relief of pain with recent bilateral L4 transforaminal epidural steroid injection. We discussed treatment plan in detail today. I do think she would benefit from a more comprehensive approach to pain management. I placed referral to [facility] for evaluation. I also encouraged her to follow up with Dr. Catherene to discuss further issues with radiofrequency ablation and referral to surgeon. Her exam today is non focal, good strength noted to bilateral lower extremities.     Meds & Orders: No orders of the defined types were placed in this encounter.   Orders Placed This Encounter  Procedures   Ambulatory referral to Physical Medicine Rehab    Follow-up: Return if symptoms worsen or fail to improve.   Procedures: No procedures performed      Clinical History: CT LUMBAR SPINE WITHOUT CONTRAST,  06/27/2024 9:29 AM  INDICATION: Acute onset left radicular leg pain a week following lumbar RFA, eval fluid collection over left L4/L5, L5/S1 levels or evidence of foraminal narrowing or disc herniation affecting left leg, Radiculopathy, lumbar region \ M54.16 Radiculopathy, lumbar region  COMPARISON: CT lumbar spine  07/18/2003  TECHNIQUE: Thin-section axial CT images of the entire lumbar spine were acquired without contrast. Supplemental 2D reformatted images were generated and reviewed as needed.  All CT scans at [facility] and [facility] are performed using radiation dose optimization techniques as appropriate to a performed exam, including but not limited to one or more of the following: automatic exposure control, adjustment of the mA and/or kV according to patient size, use of iterative reconstruction technique. In addition, our institution participates in a radiation dose monitoring program to optimize patient radiation exposure.  LEVELS IMAGED: Lower thoracic region to upper sacrum.   FINDINGS:  Alignment: No acute traumatic malalignment. Mild levocurvature of the lumbar spine.  Vertebrae: No acute fractures. Vertebral body heights maintained.  Degenerative changes: No high-grade canal stenosis. Mild canal stenosis at L3-L4 and moderate spinal canal stenosis at L4-L5. Vacuum disc phenomenon at L5-S1. Moderate facet joint arthropathy bilaterally from L3-S1. Mild to moderate neural foraminal stenosis at L5-S1 on the left.  IMPRESSION:  1.  No high grade spinal canal or neural foraminal stenosis. Consider MRI for further evaluation as clinically warrented. 2.  No fluid collections identified. Exam End: 06/27/24 09:29   She reports that she has never smoked. She has never used smokeless tobacco.  Recent Labs    11/09/23 0859  HGBA1C 6.2*    Objective:  VS:  HT:    WT:   BMI:     BP:   HR: bpm  TEMP: ( )  RESP:  Physical Exam Vitals and nursing note reviewed.  HENT:     Head: Normocephalic and atraumatic.     Right Ear: External ear normal.     Left Ear: External ear normal.     Nose: Nose normal.     Mouth/Throat:     Mouth: Mucous membranes are moist.  Eyes:     Extraocular Movements: Extraocular movements intact.  Cardiovascular:      Rate and Rhythm: Normal rate.     Pulses: Normal pulses.  Pulmonary:     Effort: Pulmonary effort is normal.  Abdominal:     General: Abdomen is flat. There is no distension.  Musculoskeletal:        General: Tenderness present.     Cervical back: Normal range of motion.     Comments: Today's exam difficult likely due to pain and decreased effort. Patient is slow to rise from seated position to standing. Good lumbar range of motion. No pain noted with facet loading. 5/5 strength noted with bilateral hip flexion, knee flexion/extension, ankle dorsiflexion/plantarflexion and EHL. No clonus noted bilaterally. No pain upon palpation of greater trochanters. No pain with internal/external rotation of bilateral hips. Sensation intact bilaterally. Myofascial tenderness noted upon palpation of bilateral lumbar paraspinal regions, buttocks and legs. Negative slump test bilaterally. Ambulates without aid, gait steady.   Skin:    General: Skin is warm and dry.     Capillary Refill: Capillary refill takes less than 2 seconds.  Neurological:     General: No focal deficit present.     Mental Status: She is alert and oriented to person, place, and time.  Psychiatric:        Mood  and Affect: Mood normal.        Behavior: Behavior normal.     Ortho Exam  Imaging: No results found.  Past Medical/Family/Surgical/Social History: Medications & Allergies reviewed per EMR, new medications updated. Patient Active Problem List   Diagnosis Date Noted   OA (osteoarthritis) of hip 11/14/2023   Status post total replacement of right hip 11/14/2023   Family history of colon cancer 11/02/2023   History of cholecystectomy 11/02/2023   Diarrhea 11/02/2023   Nausea 11/02/2023   Dysphagia 11/02/2023   Gastroesophageal reflux disease 11/02/2023   Toxic multinodular goiter 02/02/2023   Goiter, toxic, multinodular 01/28/2023   hyperthyroidism 03/17/2022   Cholangiectasis 03/17/2022   Esophageal dysphagia  03/17/2022   Fecal urgency 03/17/2022   Hyperglycemia 03/17/2022   Hypomagnesemia 03/17/2022   Neck pain 03/17/2022   Salivary calculus 03/17/2022   Spasm 03/17/2022   Hyperthyroidism 03/17/2022   Primary osteoarthritis of first carpometacarpal joint of left hand 12/24/2019   Vomiting 08/28/2019   Diabetic peripheral neuropathy associated with type 2 diabetes mellitus (HCC) 06/06/2019   Hemiplegia (HCC) 06/06/2019   TIA (transient ischemic attack) 05/29/2019   Anterolisthesis 05/29/2019   Thyroid  nodule 05/29/2019   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   Right arm weakness 05/27/2019   Type 2 diabetes mellitus without complication 05/27/2019   Arthritis of carpometacarpal (CMC) joint of right thumb 04/16/2019   Generalized pruritus 02/27/2019   Rash and other nonspecific skin eruption 02/27/2019   Dysuria 01/03/2019   Localized edema 10/22/2018   Shoulder joint pain 08/28/2018   Mixed conductive and sensorineural hearing loss of left ear with restricted hearing of right ear 04/26/2018   Vertigo 04/26/2018   Acute serous otitis media of left ear 04/26/2018   Referred otalgia of left ear 04/26/2018   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   Venous stasis dermatitis of left lower extremity 04/09/2018   Achilles tendon contracture, left 04/09/2018   Pain in left ankle and joints of left foot 04/09/2018   Cough 04/09/2018   Otitis media 04/09/2018   Primary osteoarthritis of first carpometacarpal joint of right hand 10/09/2017   Bilateral hand pain 10/05/2017   Primary osteoarthritis of both first carpometacarpal joints 10/05/2017   Trochanteric bursitis, right hip 06/29/2017   Degenerative disc disease at L5-S1 level 05/04/2017   History of joint replacement 05/04/2017   Claw toe, acquired, right 04/27/2017   Achilles tendon contracture, right 04/27/2017   Pain in right foot 04/11/2017   Status post left hip replacement 10/21/2016    Chronic back pain 01/14/2016   Migraine 01/14/2016   Hematuria 03/27/2015   Shortness of breath 03/27/2015   Essential hypertension 01/22/2015   Obstructive sleep apnea (adult) (pediatric) 10/30/2014   Psychophysiologic insomnia 04/04/2014   Gastro-esophageal reflux disease without esophagitis 09/12/2013   Obesity 12/02/2011   Disequilibrium 11/10/2009   Enterocolitis due to Clostridium difficile 11/10/2009   Family history of diabetes mellitus 11/10/2009   Hyperlipidemia 11/10/2009   Meniere disease 11/10/2009   Headache 11/10/2009   Perforation of tympanic membrane 11/10/2009   Soft tissue lesion of shoulder region 11/10/2009   Past Medical History:  Diagnosis Date   Anxiety    Arthritis    Arthritis    back, wrists   Cancer (HCC)    skin cancer   Carpal tunnel syndrome    Complication of anesthesia    DDD (degenerative disc disease)    Diabetes mellitus without complication (HCC)    does not take meds  Dysrhythmia 1995   irreg hr   Full dentures    GERD (gastroesophageal reflux disease)    Headache    High cholesterol    History of bronchitis    History of hiatal hernia    History of kidney stones    2015   Hypertension    Hyperthyroidism    PONV (postoperative nausea and vomiting)    Sleep apnea    pt. does not use a CPAP or BiPAP   Stroke (HCC)    Pt. states possible mini stroke in 2014, no deficits   Vertigo    Family History  Problem Relation Age of Onset   Cancer Mother    Diabetes Mother    Heart disease Mother    Hyperlipidemia Mother    Hypertension Mother    Cancer Father    Diabetes Father    Heart disease Father    Hyperlipidemia Father    Hypertension Father    Varicose Veins Father    Colon cancer Sister    Hypertension Sister    Cancer Sister        colon   Heart disease Sister    Hyperlipidemia Sister    Hypertension Brother    Cancer Brother    Diabetes Brother    Hyperlipidemia Brother    Cancer Brother         pancreatic   Esophageal cancer Neg Hx    Inflammatory bowel disease Neg Hx    Liver disease Neg Hx    Prostate cancer Neg Hx    Stomach cancer Neg Hx    Past Surgical History:  Procedure Laterality Date   ABDOMINAL HYSTERECTOMY     BACK SURGERY  11/28/2002   [id number]   BACK SURGERY     CARDIAC CATHETERIZATION     01/27/15 [facility]): EF 60%, normal coronaries.   CARPAL TUNNEL RELEASE  09/20/2012   Procedure: CARPAL TUNNEL RELEASE;  Surgeon: Lamar LULLA Leonor Mickey., MD;  Location: [facility];  Service: Orthopedics;  Laterality: Left;   CARPOMETACARPEL SUSPENSION PLASTY Right 12/12/2018   Procedure: RIGHT THUMB CARPOMETACARPAL (CMC) ARTHROPLASTY;  Surgeon: Jerri Kay HERO, MD;  Location: [facility];  Service: Orthopedics;  Laterality: Right;   CARPOMETACARPEL SUSPENSION PLASTY Left 12/30/2019   Procedure: LEFT THUMB CARPOMETACARPAL (CMC) ARTHROPLASTY;  Surgeon: Jerri Kay HERO, MD;  Location: [facility];  Service: Orthopedics;  Laterality: Left;   CATARACT EXTRACTION, BILATERAL     CHOLECYSTECTOMY  07/2016   done [place]   DORSAL COMPARTMENT RELEASE  09/20/2012   Procedure: RELEASE DORSAL COMPARTMENT (DEQUERVAIN);  Surgeon: Lamar LULLA Leonor Mickey., MD;  Location: [facility];  Service: Orthopedics;  Laterality: Left;   EYE SURGERY     bilat cataracts   EYE SURGERY Right    torn retina repaired   HERNIA REPAIR Left 1990   left groin    LOWER EXTREMITY ANGIOGRAM Left 06/08/2015   Procedure: Ascending Venogram  Iliac Vein ;  Surgeon: Carlin FORBES Haddock, MD;  Location: [facility];  Service: Vascular;  Laterality: Left;  ;  TARA- [employer] / ZACH-[place] HAVE BEEN NOTIFIED/ CONFIRMED   PARTIAL HYSTERECTOMY     SHOULDER SURGERY Left    THYROIDECTOMY N/A 02/02/2023   Procedure: TOTAL THYROIDECTOMY;  Surgeon: Eletha Boas, MD;  Location: [facility];  Service: General;  Laterality: N/A;   TOTAL HIP ARTHROPLASTY Left 10/21/2016   Procedure:  LEFT TOTAL HIP ARTHROPLASTY ANTERIOR APPROACH;  Surgeon: Lonni CINDERELLA Poli, MD;  Location: [facility];  Service: Orthopedics;  Laterality: Left;   TOTAL HIP ARTHROPLASTY Right 11/14/2023   Procedure: RIGHT TOTAL HIP ARTHROPLASTY ANTERIOR APPROACH;  Surgeon: Vernetta Lonni GRADE, MD;  Location: [facility];  Service: Orthopedics;  Laterality: Right;   WRIST SURGERY Bilateral    carpal tunnel   Social History   Occupational History   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

## 2024-09-30 ENCOUNTER — Encounter: Payer: Self-pay | Admitting: Radiology

## 2024-10-09 ENCOUNTER — Ambulatory Visit: Admitting: Orthopaedic Surgery

## 2024-10-09 DIAGNOSIS — M5416 Radiculopathy, lumbar region: Secondary | ICD-10-CM | POA: Diagnosis not present

## 2024-10-11 DIAGNOSIS — M5416 Radiculopathy, lumbar region: Secondary | ICD-10-CM | POA: Diagnosis not present

## 2024-10-14 ENCOUNTER — Ambulatory Visit: Admitting: Orthopaedic Surgery

## 2024-10-14 DIAGNOSIS — S83242D Other tear of medial meniscus, current injury, left knee, subsequent encounter: Secondary | ICD-10-CM | POA: Diagnosis not present

## 2024-10-14 NOTE — Progress Notes (Signed)
 The patient is a 68 year old well-known to us .  She comes in today to go over MRI of both of her knees.  She had had no previous knee issues and her mechanical fall a few months ago and has had locking catching and swelling in both knees.  We have aspirated both knees and placed her injection in both knees.  Her plain film showed well-maintained knee joint.  The left knee hurts worse on the right and both knees hurt on the medial joint line.  On exam today both knees still have some swelling with medial joint line tenderness and effusion with positive McMurray's sign in the medial compartment of both knees.  The left is worse than the right.  MRIs of both knees show acute medial meniscal tears with flap components.  There is partial tearing of the right knee PCL and a small subacute fracture in the posterior medial tibial plateau.  There is a significant moderate effusion of both knees.  The left knee does have a tear from the meniscal root to the posterior horn of the mid body with a flap component.  There is only mild thinning of the articular cartilage and no subchondral edema.  There is a Baker's cyst that is ruptured on the left knee.  I did go over the MRI findings with her with her left knee.  She continues to have mechanical symptoms and has tried and failed all forms of conservative treatment.  At this point a left knee arthroscopy is warranted with likely a partial meniscectomy.  If there is a potential to repair the meniscus thirdly that can be done but most likely this will be a partial medial meniscectomy given the flap component.  I explained in detail what the surgery involves including discussion of the risks and benefits of surgery and what to expect from an intraoperative and postoperative course.  We will address the right knee at a later time.  She agrees with this treatment plan.  She would likely need to be out of work about 2 to 4 weeks after surgery as she recovers.

## 2024-10-15 ENCOUNTER — Telehealth: Payer: Self-pay

## 2024-10-15 NOTE — Telephone Encounter (Signed)
 I called patient to discuss scheduling knee scope.  Left voice mail message for her to return my call.

## 2024-10-23 ENCOUNTER — Encounter: Payer: Self-pay | Admitting: Physical Medicine & Rehabilitation

## 2024-10-31 ENCOUNTER — Other Ambulatory Visit: Payer: Self-pay | Admitting: Orthopaedic Surgery

## 2024-10-31 DIAGNOSIS — X58XXXA Exposure to other specified factors, initial encounter: Secondary | ICD-10-CM | POA: Diagnosis not present

## 2024-10-31 DIAGNOSIS — S83242A Other tear of medial meniscus, current injury, left knee, initial encounter: Secondary | ICD-10-CM | POA: Diagnosis not present

## 2024-10-31 DIAGNOSIS — M94262 Chondromalacia, left knee: Secondary | ICD-10-CM | POA: Diagnosis not present

## 2024-10-31 DIAGNOSIS — G8918 Other acute postprocedural pain: Secondary | ICD-10-CM | POA: Diagnosis not present

## 2024-10-31 DIAGNOSIS — S83232D Complex tear of medial meniscus, current injury, left knee, subsequent encounter: Secondary | ICD-10-CM | POA: Diagnosis not present

## 2024-10-31 DIAGNOSIS — Y999 Unspecified external cause status: Secondary | ICD-10-CM | POA: Diagnosis not present

## 2024-10-31 MED ORDER — HYDROCODONE-ACETAMINOPHEN 5-325 MG PO TABS: 1.0000 | ORAL_TABLET | Freq: Four times a day (QID) | ORAL | 0 refills | Status: DC | PRN

## 2024-11-07 ENCOUNTER — Encounter: Admitting: Orthopaedic Surgery

## 2024-11-08 ENCOUNTER — Telehealth: Payer: Self-pay | Admitting: Orthopaedic Surgery

## 2024-11-08 NOTE — Telephone Encounter (Signed)
 Pt called and said she missed her post op appointment for yesterday and thought it was today. She needs to be rescheduled. CB#[phone]

## 2024-11-13 ENCOUNTER — Encounter: Payer: Self-pay | Admitting: Orthopaedic Surgery

## 2024-11-13 ENCOUNTER — Ambulatory Visit: Admitting: Orthopaedic Surgery

## 2024-11-13 DIAGNOSIS — Z9889 Other specified postprocedural states: Secondary | ICD-10-CM

## 2024-11-13 DIAGNOSIS — S83242D Other tear of medial meniscus, current injury, left knee, subsequent encounter: Secondary | ICD-10-CM

## 2024-11-13 NOTE — Progress Notes (Signed)
 The patient is a 68 year old female who is being seen for first postoperative visit status post a left knee arthroscopy with a partial medial meniscectomy.  We did find some cartilage wear in her knee as well.  This was an acute meniscal tear after a mechanical fall.  She did also injure her right knee when she fell and a MRI of the right knee also showed an acute and complex posterior horn, root and mid body medial meniscal tear with no significant cartilage wear in the knee.  There was also a slight insufficiency fracture of the medial tibial plateau so we needed to hold off on surgery on the right knee to allow that to heal.  She says the left knee postoperative is doing well.  On exam the left knee has only mild effusion from surgery.  The sutures have been removed from her knee.  She has excellent range of motion of that knee and her pain is significantly less.  Her right knee has significant pain in the posterior medial aspect of the knee and a positive pain with McMurray's exam to the medial compartment of the knee.  We did go over arthroscopy x-rays showing her the extent of the meniscal tear of her left knee.  Fortune of the cartilage really looks good on that medial weightbearing surface.  She will continue to rehab this knee.  Again the MRI of her right knee shows a complex medial meniscal tear and this is an acute tear.  She still remains symptomatic with mechanical symptoms of the right knee and at this point we are recommending arthroscopic intervention on the right knee.  Having had this done a left knee recently she is fully aware what the surgery involves and the risks and benefits of surgery.  We will work on getting this scheduled in the near future for a right knee arthroscopy with a partial medial meniscectomy due to her complex meniscal tear.

## 2024-11-27 ENCOUNTER — Telehealth: Payer: Self-pay | Admitting: Orthopaedic Surgery

## 2024-11-27 NOTE — Telephone Encounter (Signed)
 Pt called stating she had surgery and asking when she can be release back to work. Please call pt about this matter at [phone].

## 2024-11-29 ENCOUNTER — Other Ambulatory Visit: Payer: Self-pay | Admitting: Gastroenterology

## 2024-12-06 ENCOUNTER — Encounter: Payer: Self-pay | Admitting: Physical Medicine & Rehabilitation

## 2024-12-06 ENCOUNTER — Encounter: Attending: Physical Medicine & Rehabilitation | Admitting: Physical Medicine & Rehabilitation

## 2024-12-06 VITALS — BP 171/112 | HR 89 | Ht 66.0 in | Wt 182.0 lb

## 2024-12-06 DIAGNOSIS — M961 Postlaminectomy syndrome, not elsewhere classified: Secondary | ICD-10-CM | POA: Diagnosis not present

## 2024-12-06 DIAGNOSIS — S83241A Other tear of medial meniscus, current injury, right knee, initial encounter: Secondary | ICD-10-CM | POA: Diagnosis not present

## 2024-12-06 DIAGNOSIS — M5416 Radiculopathy, lumbar region: Secondary | ICD-10-CM | POA: Insufficient documentation

## 2024-12-06 DIAGNOSIS — M9904 Segmental and somatic dysfunction of sacral region: Secondary | ICD-10-CM | POA: Diagnosis not present

## 2024-12-06 MED ORDER — DULOXETINE HCL 20 MG PO CPEP: 20.0000 mg | ORAL_CAPSULE | Freq: Every day | ORAL | 1 refills | Status: AC

## 2024-12-06 NOTE — Progress Notes (Signed)
 "  Subjective:    Patient ID: Natalie Bond, female    DOB: 04-27-1956, 69 y.o.   MRN: [id number]  HPI Discussed the use of AI scribe software for clinical note transcription with the patient, who gave verbal consent to proceed.  History of Present Illness Natalie Bond is a 69 year old female with chronic lumbar radiculopathy, bilateral knee osteoarthritis, and prior bilateral hip arthroplasty who presents for evaluation and management of persistent low back and lower extremity pain.  She has experienced over 20 years of chronic low back pain with radiation into the left leg, initially severe enough to cause inability to ambulate. In 2004, she underwent L5-S1 laminectomy followed by a second surgery for scar tissue removal, with persistent low back pain and left leg symptoms despite these interventions.  She has received multiple pain management interventions, including epidural steroid injections and lumbar medial branch blocks, with epidural injections providing partial relief of left leg pain and medial branch blocks improving back pain for approximately two weeks. She found the temporary relief from medial branch blocks helpful but was disappointed by the short duration. She was referred for further management after limited benefit from these procedures.  She subsequently underwent lumbar radiofrequency ablation, after which she experienced significant pain and functional decline for about a week, requiring her to crawl up steps. She remains apprehensive about further procedures but notes some improvement in pain since the ablation, though symptoms persist.  She has chronic numbness of the left foot, especially on the plantar surface, with numbness extending up the left leg. She is cautious with ambulation due to decreased sensation and has a history of falls, including one resulting in bilateral knee injuries. She tore the meniscus in both knees, requiring left knee surgery and is  scheduled for right knee surgery due to a concomitant bone fracture. She experiences bilateral ankle edema, sometimes severe enough to cause weeping, and uses diuretics as needed. She has a weak right kidney, limiting her use of NSAIDs. She takes ibuprofen , acetaminophen , and gabapentin  for pain, with some relief from gabapentin .  She has a history of left buttock bursitis, previously treated with injections with resolution of symptoms. She has not had recent formal physical therapy for her back but continues home stretching and exercise routines. She previously walked regularly but has reduced activity due to knee pain and recent injuries.  She is allergic to Dilaudid , with a history of severe swelling. She denies current hip pain. She experiences pain and soreness in the back, especially with forward bending. She has persistent numbness and paresthesia in the left foot and ankle, with decreased sensation on the plantar surface and heel. She is able to move and use her toes.   MRI LUMBAR SPINE WITHOUT CONTRAST, 10/09/2024 5:14 PM   INDICATION: Lumbar radiculopathy, symptoms persist with > 6 wks treatment, Left radicular leg pain, eval L5/S1 stenosis, Radiculopathy, lumbar region \ \ M54.16 Radiculopathy, lumbar region   COMPARISON: CT June 27, 2024   TECHNIQUE: Multiplanar, multisequence surface-coil magnetic resonance imaging of the lumbar spine was performed without contrast.    LEVELS IMAGED: Lower thoracic region to upper sacrum.   FINDINGS:   Alignment: No substantial subluxation.   Vertebrae: Vertebral body heights are maintained. No marrow signal abnormalities to suggest neoplasm.   Conus medullaris: In normal position. Normal signal and contour.   Degenerative changes:  T12-L1: No substantial canal or foraminal stenosis.  L1-L2: Mild broad-based disc bulge with no notable central or foraminal stenosis.  L2-L3: Mild broad-based disc bulge and hypertrophic facet disease and deep  margins of the thecal sac contributing to relatively mild central stenosis. No notable foraminal narrowing.  L3-L4: Slightly rightward eccentric mild to moderate broad-based disc bulge and moderate hypertrophic facet disease contribute to mild rightward eccentric central stenosis. No notable foraminal narrowing.  L4-L5: Minor broad-based disc bulge and moderate hypertrophic facet disease contribute to relatively mild central stenosis. No notable foraminal narrowing.  L5-S1: Mild broad-based disc bulge with superimposed posterior annular fissure and mild hypertrophic facet disease with no notable central stenosis. Changes do contribute to mild left foraminal narrowing.   Upper sacrum: No focal lesion identified. Procedure Note  Camilla Debby Ewings, MD - 11/01/2024  Formatting of this note might be different from the original.  MRI LUMBAR SPINE WITHOUT CONTRAST, 10/09/2024 5:14 PM   INDICATION: Lumbar radiculopathy, symptoms persist with > 6 wks treatment, Left radicular leg pain, eval L5/S1 stenosis, Radiculopathy, lumbar region \ \ M54.16 Radiculopathy, lumbar region   COMPARISON: CT June 27, 2024   TECHNIQUE: Multiplanar, multisequence surface-coil magnetic resonance imaging of the lumbar spine was performed without contrast.    LEVELS IMAGED: Lower thoracic region to upper sacrum.   FINDINGS:   Alignment: No substantial subluxation.   Vertebrae: Vertebral body heights are maintained. No marrow signal abnormalities to suggest neoplasm.   Conus medullaris: In normal position. Normal signal and contour.   Degenerative changes:  T12-L1: No substantial canal or foraminal stenosis.  L1-L2: Mild broad-based disc bulge with no notable central or foraminal stenosis.  L2-L3: Mild broad-based disc bulge and hypertrophic facet disease and deep margins of the thecal sac contributing to relatively mild central stenosis. No notable foraminal narrowing.  L3-L4: Slightly rightward eccentric mild  to moderate broad-based disc bulge and moderate hypertrophic facet disease contribute to mild rightward eccentric central stenosis. No notable foraminal narrowing.  L4-L5: Minor broad-based disc bulge and moderate hypertrophic facet disease contribute to relatively mild central stenosis. No notable foraminal narrowing.  L5-S1: Mild broad-based disc bulge with superimposed posterior annular fissure and mild hypertrophic facet disease with no notable central stenosis. Changes do contribute to mild left foraminal narrowing.   Upper sacrum: No focal lesion identified.   IMPRESSION:  1. Discogenic and hypertrophic facet disease contribute to mild multilevel central stenosis spanning the L2-L3 through L4-L5 levels.  2. Degenerative changes contribute to relatively mild left foraminal narrowing at L5-S1. Exam End: 10/09/24 17:14   Specimen Collected: 11/01/24 11:36 Last Resulted: 11/01/24 11:37  Received From: [facility]  Result Received: 11/07/24 13:04    MRI LUMBAR SPINE WITHOUT CONTRAST   TECHNIQUE: Multiplanar, multisequence MR imaging of the lumbar spine was performed. No intravenous contrast was administered.   COMPARISON:  12/18/2015 lumbar spine MRI.   FINDINGS: Segmentation:  Standard.   Alignment: Mild lumbar levocurvature with apex at L3. Normal lumbar lordosis without listhesis.   Vertebrae: No fracture, evidence of discitis, or bone lesion. Right-sided L4-5 facet edema.   Conus medullaris and cauda equina: Conus extends to the L1-2 level. Conus and cauda equina appear normal.   Paraspinal and other soft tissues: Chronic postsurgical changes related to left L5-S1 hemilaminectomy. No fluid collection.   Disc levels:   T12-L1: No significant disc displacement, foraminal stenosis, or canal stenosis.   L1-2: No significant disc displacement, foraminal stenosis, or canal stenosis.   L2-3: Stable small left foraminal disc protrusion. No significant foraminal or canal  stenosis.   L3-4: Stable disc bulge and facet hypertrophy. Interval resorption of right  juxta-articular cyst. Mild bilateral recess stenosis. No foraminal or canal stenosis.   L4-5: Stable disc bulge, facet hypertrophy, and ligamentum flavum hypertrophy, greater on the left. Mild left lateral recess stenosis. No foraminal or canal stenosis.   L5-S1: Left hemilaminectomy. Stable left central disc protrusion with mild left lateral recess stenosis. No significant foraminal or canal stenosis.   IMPRESSION: 1. No acute fracture.  Mild levocurvature of the lumbar spine. 2. Right L4-5 facet edema, likely degenerative. 3. Stable lumbar spondylosis. No significant foraminal or canal stenosis.     Electronically Signed   By: Gustavo Mary M.D.   On: 12/01/2018 14:02     Pain Inventory Average Pain 8 Pain Right Now 7 My pain is sharp, burning, stabbing, and aching  In the last 24 hours, has pain interfered with the following? General activity 5 Relation with others 5 Enjoyment of life 5 What TIME of day is your pain at its worst? morning , evening, and night Sleep (in general) Fair  Pain is worse with: walking, sitting, and some activites Pain improves with: rest, heat/ice, and injections Relief from Meds: 5  walk without assistance how many minutes can you walk? 10 ability to climb steps?  yes do you drive?  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  no  Any changes since last visit?  no    Family History  Problem Relation Age of Onset   Cancer Mother    Diabetes Mother    Heart disease Mother    Hyperlipidemia Mother    Hypertension Mother    Cancer Father    Diabetes Father    Heart disease Father    Hyperlipidemia Father    Hypertension Father     Varicose Veins Father    Colon cancer Sister    Hypertension Sister    Cancer Sister        colon   Heart disease Sister    Hyperlipidemia Sister    Hypertension Brother    Cancer Brother    Diabetes Brother    Hyperlipidemia Brother    Cancer Brother        pancreatic   Esophageal cancer Neg Hx    Inflammatory bowel disease Neg Hx    Liver disease Neg Hx    Prostate cancer Neg Hx    Stomach cancer Neg Hx    Social History   Socioeconomic History   Marital status: Married    Spouse name: sports administrator   Number of children: 2   Years of education: college   Highest education level: Not on file  Occupational History   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  Other Topics Concern   Not on file  Social History Narrative   ** Merged History Encounter **       Social Drivers of Health   Tobacco Use: Low Risk (12/06/2024)   Patient History    Smoking Tobacco Use: Never    Smokeless Tobacco  Use: Never    Passive Exposure: Not on file  Financial Resource Strain: Low Risk (01/18/2022)   Overall Financial Resource Strain (CARDIA)    Difficulty of Paying Living Expenses: Not hard at all  Food Insecurity: No Food Insecurity (11/14/2023)   Hunger Vital Sign    Worried About Running Out of Food in the Last Year: Never true    Ran Out of Food in the Last Year: Never true  Transportation Needs: No Transportation Needs (11/14/2023)   PRAPARE - Administrator, Civil Service (Medical): No    Lack of Transportation (Non-Medical): No  Physical Activity: Insufficiently Active (01/18/2022)   Exercise Vital Sign    Days of Exercise per Week: 3 days    Minutes of Exercise per  Session: 20 min  Stress: Not on file  Social Connections: Unknown (04/08/2022)   Received from [facility]   Social Network    Social Network: Not on file  Depression (PHQ2-9): Medium Risk (12/06/2024)   Depression (PHQ2-9)    PHQ-2 Score: 6  Alcohol  Screen: Low Risk (01/18/2022)   Alcohol  Screen    Last Alcohol  Screening Score (AUDIT): 0  Housing: Unknown (05/02/2024)   Received from [facility]   Epic    Unable to Pay for Housing in the Last Year: Not on file    Number of Times Moved in the Last Year: Not on file    At any time in the past 12 months, were you homeless or living in a shelter (including now)?: No  Utilities: Not At Risk (11/14/2023)   [facility] Utilities    Threatened with loss of utilities: No  Health Literacy: Not on file   Past Surgical History:  Procedure Laterality Date   ABDOMINAL HYSTERECTOMY     BACK SURGERY  11/28/2002   [id number]   BACK SURGERY     CARDIAC CATHETERIZATION     01/27/15 [facility]): EF 60%, normal coronaries.   CARPAL TUNNEL RELEASE  09/20/2012   Procedure: CARPAL TUNNEL RELEASE;  Surgeon: Lamar LULLA Leonor Mickey., MD;  Location: [facility];  Service: Orthopedics;  Laterality: Left;   CARPOMETACARPEL SUSPENSION PLASTY Right 12/12/2018   Procedure: RIGHT THUMB CARPOMETACARPAL (CMC) ARTHROPLASTY;  Surgeon: Jerri Kay HERO, MD;  Location: [facility];  Service: Orthopedics;  Laterality: Right;   CARPOMETACARPEL SUSPENSION PLASTY Left 12/30/2019   Procedure: LEFT THUMB CARPOMETACARPAL (CMC) ARTHROPLASTY;  Surgeon: Jerri Kay HERO, MD;  Location: [facility];  Service: Orthopedics;  Laterality: Left;   CATARACT EXTRACTION, BILATERAL     CHOLECYSTECTOMY  07/2016   done [place]   DORSAL COMPARTMENT RELEASE  09/20/2012   Procedure: RELEASE DORSAL COMPARTMENT (DEQUERVAIN);  Surgeon: Lamar LULLA Leonor Mickey., MD;  Location: [facility];  Service: Orthopedics;  Laterality: Left;   EYE SURGERY      bilat cataracts   EYE SURGERY Right    torn retina repaired   HERNIA REPAIR Left 1990   left groin    LOWER EXTREMITY ANGIOGRAM Left 06/08/2015   Procedure: Ascending Venogram  Iliac Vein ;  Surgeon: Carlin FORBES Haddock, MD;  Location: [facility] OR;  Service: Vascular;  Laterality: Left;  ;  TARA- [facility] [employer] / ZACH-[place] HAVE BEEN NOTIFIED/ CONFIRMED   PARTIAL HYSTERECTOMY     SHOULDER SURGERY Left    THYROIDECTOMY N/A 02/02/2023   Procedure: TOTAL THYROIDECTOMY;  Surgeon: Eletha Boas, MD;  Location: [facility];  Service: General;  Laterality: N/A;   TOTAL HIP  ARTHROPLASTY Left 10/21/2016   Procedure: LEFT TOTAL HIP ARTHROPLASTY ANTERIOR APPROACH;  Surgeon: Lonni CINDERELLA Poli, MD;  Location: [facility];  Service: Orthopedics;  Laterality: Left;   TOTAL HIP ARTHROPLASTY Right 11/14/2023   Procedure: RIGHT TOTAL HIP ARTHROPLASTY ANTERIOR APPROACH;  Surgeon: Poli Lonni CINDERELLA, MD;  Location: [facility] OR;  Service: Orthopedics;  Laterality: Right;   WRIST SURGERY Bilateral    carpal tunnel   Past Medical History:  Diagnosis Date   Anxiety    Arthritis    Arthritis    back, wrists   Cancer (HCC)    skin cancer   Carpal tunnel syndrome    Complication of anesthesia    DDD (degenerative disc disease)    Diabetes mellitus without complication (HCC)    does not take meds   Dysrhythmia 1995   irreg hr   Full dentures    GERD (gastroesophageal reflux disease)    Headache    High cholesterol    History of bronchitis    History of hiatal hernia    History of kidney stones    2015   Hypertension    Hyperthyroidism    PONV (postoperative nausea and vomiting)    Sleep apnea    pt. does not use a CPAP or BiPAP   Stroke (HCC)    Pt. states possible mini stroke in 2014, no deficits   Vertigo    BP (!) 171/112   Pulse 89   Ht 5' 6 (1.676 m)   Wt 182 lb (82.6 kg)   SpO2 96%   BMI 29.38 kg/m   Opioid Risk Score:   Fall Risk Score:  `1  Depression screen [facility] 2/9     12/06/2024     3:21 PM 10/13/2022    3:39 PM  Depression screen PHQ 2/9  Decreased Interest 2 2  Down, Depressed, Hopeless 0 2  PHQ - 2 Score 2 4  Altered sleeping 2 3  Tired, decreased energy 2 3  Change in appetite 0 3  Feeling bad or failure about yourself  0 0  Trouble concentrating 0 0  Moving slowly or fidgety/restless 0 0  Suicidal thoughts 0 0  PHQ-9 Score 6 13      Data saved with a previous flowsheet row definition    Review of Systems  Musculoskeletal:  Positive for back pain.  All other systems reviewed and are negative.      Objective:   Physical Exam General No acute distress Mood affect appropriate Extremities left lower extremity 1+ pretibial edema as well as ankle edema no edema on the right side Sensation is reduced left foot mainly on the plantar surface and lateral aspect more so than medially she has intact sensation at the medial and ankle but not at the lateral malleolus. Motor strength is 5/5 bilateral hip flexor knee extensor ankle dorsiflexor Knees have no evidence of effusion she has medial joint line tenderness on the right side but not on the left side.  She has good hip range of motion bilaterally. Her lumbar spine has tenderness palpation starting around L4 down through S1 1 and S2 area. There is some mild gluteal tenderness as well as greater trochanteric tenderness  Sacral thrust (prone) : Positive pain at the SI joint bilaterally Lateral compression: Negative FABER's: Pain at the SI joint on the left side Distraction (supine): Negative Thigh thrust test: Positive pain at the SI joint bilaterally       Assessment & Plan:   Assessment and Plan  Assessment & Plan Chronic low back pain with lumbar radiculopathy and degenerative changes Persistent symptoms post L5-S1 laminectomy with partial relief from gabapentin . Duloxetine  deemed suitable for chronic pain management. - Prescribed duloxetine  20 mg once daily, titrate as needed. - Follow-up in one  month to assess duloxetine  efficacy and tolerability. - Encouraged continuation of home exercise and stretching regimen. - Deferred further injections; reassess post knee recovery. - Documented severe allergy to Dilaudid .  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 - Consider sacroiliac joint injection post right knee surgery recovery.  Bilateral knee osteoarthritis with right meniscal tear, status post left knee surgery, planned right knee surgery Bilateral knee osteoarthritis with right meniscal tear, impacting mobility and exacerbating back pain. NSAID use limited by renal concerns. - Continue with planned right knee surgery. - Minimize ibuprofen  and acetaminophen  use due to renal concerns. - Monitor post-operative recovery and reassess impact on mobility and back pain.  Peripheral neuropathy of the left foot Peripheral neuropathy likely secondary to lumbar nerve injury, with preserved strength but increased fall risk due to proprioceptive deficits. - Encouraged vigilance with ambulation and environmental awareness to reduce fall risk. - Reinforced importance of continued activity and exercises to maintain strength and function.   "

## 2024-12-06 NOTE — Patient Instructions (Signed)
" °  VISIT SUMMARY: During your visit, we discussed your chronic low back pain, knee osteoarthritis, and peripheral neuropathy. We have made adjustments to your pain management plan and discussed upcoming treatments and precautions to help manage your symptoms and improve your quality of life.  YOUR PLAN: CHRONIC LOW BACK PAIN WITH LUMBAR RADICULOPATHY AND DEGENERATIVE CHANGES: You have persistent low back pain and left leg symptoms despite previous surgeries and treatments. -Start taking duloxetine  20 mg once daily and adjust the dose as needed. -Follow up in one month to assess how well duloxetine  is working and if you can tolerate it. -Continue your home exercise and stretching routine. -We will not do further injections until after your knee has recovered. -Your severe allergy to Dilaudid  has been documented.  SACROILIAC JOINT DYSFUNCTION: Your left sacroiliac joint is contributing to your back pain. -Consider a sacroiliac joint injection after your right knee surgery recovery.  BILATERAL KNEE OSTEOARTHRITIS WITH RIGHT MENISCAL TEAR, STATUS POST LEFT KNEE SURGERY, PLANNED RIGHT KNEE SURGERY: You have osteoarthritis in both knees and a tear in the right meniscus, which affects your mobility and worsens your back pain. -Continue with your planned right knee surgery. -Minimize the use of ibuprofen  and acetaminophen  due to concerns about your kidney. -Monitor your recovery after surgery and reassess how it affects your mobility and back pain.  PERIPHERAL NEUROPATHY OF THE LEFT FOOT: You have nerve damage in your left foot, likely from a lumbar nerve injury, which increases your risk of falling. -Be careful when walking and be aware of your surroundings to reduce the risk of falling. -Continue your activities and exercises to maintain your strength and function.                      Contains text generated by Abridge.                                  Contains text generated by Abridge.   "

## 2024-12-11 ENCOUNTER — Other Ambulatory Visit: Payer: Self-pay | Admitting: Orthopaedic Surgery

## 2024-12-12 ENCOUNTER — Other Ambulatory Visit: Payer: Self-pay | Admitting: Orthopaedic Surgery

## 2024-12-12 DIAGNOSIS — S83241A Other tear of medial meniscus, current injury, right knee, initial encounter: Secondary | ICD-10-CM | POA: Diagnosis not present

## 2024-12-12 MED ORDER — HYDROCODONE-ACETAMINOPHEN 5-325 MG PO TABS: 1.0000 | ORAL_TABLET | Freq: Four times a day (QID) | ORAL | 0 refills | Status: AC | PRN

## 2024-12-19 ENCOUNTER — Ambulatory Visit: Admitting: Orthopaedic Surgery

## 2024-12-19 DIAGNOSIS — Z9889 Other specified postprocedural states: Secondary | ICD-10-CM

## 2024-12-19 NOTE — Progress Notes (Signed)
 The patient is here today for first postoperative visit status post a right knee arthroscopy with a partial medial meniscectomy.  In the last few months we have also performed arthroscopic surgery on her left knee secondary to a medial meniscal tear.  Both of these occurred after a mechanical fall.  She reports that she is doing well overall.  Both knees show no significant effusion.  The incisions on both knees are healing nicely especially the right one that was done more recently.  Her calf is soft on both sides.  She has good range of motion of both knees.  We did go over arthroscopy pictures for her right knee.  Fortunately she does not have any significant cartilage wear.  There is somewhere on the medial weightbearing surface of the knee but worst-case scenario for now would be considering potentially hyaluronic acid injections if she has pain from arthritis in her knees.  We will see her back in a month see how she is doing overall but no x-rays are needed.

## 2025-01-14 ENCOUNTER — Encounter: Admitting: Physical Medicine & Rehabilitation

## 2025-01-20 ENCOUNTER — Encounter: Admitting: Orthopaedic Surgery
